# Patient Record
Sex: Male | Born: 1965 | Race: White | Hispanic: No | Marital: Married | Smoking: Current every day smoker
Health system: Southern US, Community
[De-identification: ages and names within clinical notes are randomized; demographics above are authoritative.]

## PROBLEM LIST (undated history)

## (undated) DIAGNOSIS — E119 Type 2 diabetes mellitus without complications: Secondary | ICD-10-CM

## (undated) DIAGNOSIS — I2510 Atherosclerotic heart disease of native coronary artery without angina pectoris: Secondary | ICD-10-CM

## (undated) DIAGNOSIS — I209 Angina pectoris, unspecified: Secondary | ICD-10-CM

## (undated) DIAGNOSIS — R339 Retention of urine, unspecified: Secondary | ICD-10-CM

## (undated) DIAGNOSIS — N35912 Unspecified bulbous urethral stricture, male: Secondary | ICD-10-CM

## (undated) DIAGNOSIS — N189 Chronic kidney disease, unspecified: Secondary | ICD-10-CM

## (undated) DIAGNOSIS — F1190 Opioid use, unspecified, uncomplicated: Secondary | ICD-10-CM

## (undated) DIAGNOSIS — I739 Peripheral vascular disease, unspecified: Secondary | ICD-10-CM

## (undated) DIAGNOSIS — K219 Gastro-esophageal reflux disease without esophagitis: Secondary | ICD-10-CM

## (undated) DIAGNOSIS — R0602 Shortness of breath: Secondary | ICD-10-CM

## (undated) DIAGNOSIS — I10 Essential (primary) hypertension: Secondary | ICD-10-CM

## (undated) DIAGNOSIS — G8929 Other chronic pain: Secondary | ICD-10-CM

## (undated) DIAGNOSIS — I25118 Atherosclerotic heart disease of native coronary artery with other forms of angina pectoris: Secondary | ICD-10-CM

## (undated) DIAGNOSIS — R609 Edema, unspecified: Secondary | ICD-10-CM

## (undated) DIAGNOSIS — J449 Chronic obstructive pulmonary disease, unspecified: Secondary | ICD-10-CM

## (undated) DIAGNOSIS — Z7902 Long term (current) use of antithrombotics/antiplatelets: Secondary | ICD-10-CM

## (undated) DIAGNOSIS — N529 Male erectile dysfunction, unspecified: Secondary | ICD-10-CM

## (undated) DIAGNOSIS — M1990 Unspecified osteoarthritis, unspecified site: Secondary | ICD-10-CM

## (undated) DIAGNOSIS — E785 Hyperlipidemia, unspecified: Secondary | ICD-10-CM

## (undated) DIAGNOSIS — E559 Vitamin D deficiency, unspecified: Secondary | ICD-10-CM

## (undated) DIAGNOSIS — R600 Localized edema: Secondary | ICD-10-CM

## (undated) HISTORY — PX: FINGER SURGERY: SHX640

## (undated) HISTORY — PX: SUBCLAVIAN ARTERY STENT: SHX2452

## (undated) HISTORY — PX: BACK SURGERY: SHX140

## (undated) HISTORY — PX: HERNIA REPAIR: SHX51

## (undated) HISTORY — PX: TONSILLECTOMY: SUR1361

---

## 2004-04-15 ENCOUNTER — Ambulatory Visit: Payer: Self-pay | Admitting: Physician Assistant

## 2004-06-30 ENCOUNTER — Inpatient Hospital Stay: Payer: Self-pay | Admitting: Unknown Physician Specialty

## 2005-11-29 ENCOUNTER — Emergency Department: Payer: Self-pay | Admitting: Emergency Medicine

## 2008-06-14 ENCOUNTER — Emergency Department: Payer: Self-pay | Admitting: Emergency Medicine

## 2010-01-11 HISTORY — PX: FETAL SURGERY FOR CONGENITAL HERNIA: SHX1618

## 2011-05-05 LAB — COMPREHENSIVE METABOLIC PANEL
Albumin: 4 g/dL (ref 3.4–5.0)
Alkaline Phosphatase: 136 U/L (ref 50–136)
Anion Gap: 11 (ref 7–16)
BUN: 4 mg/dL — ABNORMAL LOW (ref 7–18)
Bilirubin,Total: 0.4 mg/dL (ref 0.2–1.0)
Calcium, Total: 8.9 mg/dL (ref 8.5–10.1)
Chloride: 101 mmol/L (ref 98–107)
Creatinine: 1.12 mg/dL (ref 0.60–1.30)
EGFR (African American): >60
EGFR (Non-African Amer.): >60
Osmolality: 273 (ref 275–301)
Potassium: 3.8 mmol/L (ref 3.5–5.1)
Sodium: 136 mmol/L (ref 136–145)
Total Protein: 7.7 g/dL (ref 6.4–8.2)

## 2011-05-05 LAB — CBC
HCT: 48.1 % (ref 40.0–52.0)
HGB: 16.1 g/dL (ref 13.0–18.0)
MCH: 29.8 pg (ref 26.0–34.0)
MCHC: 33.5 g/dL (ref 32.0–36.0)
MCV: 89 fL (ref 80–100)
Platelet: 127 10*3/uL — ABNORMAL LOW (ref 150–440)

## 2011-05-05 LAB — TROPONIN I: Troponin-I: <0.02 ng/mL

## 2011-05-05 LAB — PROTIME-INR
INR: 0.9
Prothrombin Time: 12.4 secs (ref 11.5–14.7)

## 2011-05-05 LAB — APTT: Activated PTT: 38.4 secs — ABNORMAL HIGH (ref 23.6–35.9)

## 2011-05-06 ENCOUNTER — Observation Stay: Payer: Self-pay | Admitting: Internal Medicine

## 2011-08-12 ENCOUNTER — Ambulatory Visit: Payer: Self-pay | Admitting: Emergency Medicine

## 2012-05-22 ENCOUNTER — Ambulatory Visit: Payer: Self-pay | Admitting: Pain Medicine

## 2012-05-31 ENCOUNTER — Ambulatory Visit: Payer: Self-pay | Admitting: Pain Medicine

## 2012-06-20 ENCOUNTER — Ambulatory Visit: Payer: Self-pay | Admitting: Pain Medicine

## 2012-08-23 ENCOUNTER — Ambulatory Visit: Payer: Self-pay | Admitting: Pain Medicine

## 2012-09-26 ENCOUNTER — Ambulatory Visit: Payer: Self-pay | Admitting: Pain Medicine

## 2012-09-27 ENCOUNTER — Other Ambulatory Visit: Payer: Self-pay | Admitting: Neurological Surgery

## 2012-09-27 DIAGNOSIS — M542 Cervicalgia: Secondary | ICD-10-CM

## 2012-09-27 DIAGNOSIS — M549 Dorsalgia, unspecified: Secondary | ICD-10-CM

## 2012-10-11 MED ORDER — DIAZEPAM 5 MG PO TABS: 10.0000 mg | ORAL_TABLET | Freq: Once | ORAL | Status: DC

## 2012-10-13 ENCOUNTER — Other Ambulatory Visit: Payer: Self-pay

## 2012-10-13 ENCOUNTER — Inpatient Hospital Stay
Admission: RE | Admit: 2012-10-13 | Discharge: 2012-10-13 | Disposition: A | Discharge status: Home / Self Care | Payer: Self-pay | Source: Ambulatory Visit | Attending: Neurological Surgery | Admitting: Neurological Surgery

## 2012-10-13 DIAGNOSIS — M549 Dorsalgia, unspecified: Secondary | ICD-10-CM

## 2012-10-13 DIAGNOSIS — M542 Cervicalgia: Secondary | ICD-10-CM

## 2012-10-23 ENCOUNTER — Other Ambulatory Visit: Payer: Self-pay

## 2012-11-09 ENCOUNTER — Other Ambulatory Visit: Payer: Self-pay

## 2012-11-14 ENCOUNTER — Ambulatory Visit
Admission: RE | Admit: 2012-11-14 | Discharge: 2012-11-14 | Disposition: A | Discharge status: Home / Self Care | Payer: Medicare Other | Source: Ambulatory Visit | Attending: Neurological Surgery | Admitting: Neurological Surgery

## 2012-11-14 VITALS — BP 119/70 | HR 83

## 2012-11-14 DIAGNOSIS — M542 Cervicalgia: Secondary | ICD-10-CM

## 2012-11-14 DIAGNOSIS — M549 Dorsalgia, unspecified: Secondary | ICD-10-CM

## 2012-11-14 MED ORDER — ONDANSETRON HCL 4 MG/2ML IJ SOLN: 4.0000 mg | Freq: Four times a day (QID) | INTRAMUSCULAR | Status: DC | PRN

## 2012-11-14 MED ORDER — IOHEXOL 300 MG/ML  SOLN
10.0000 mL | Freq: Once | INTRAMUSCULAR | Status: AC | PRN
  Administered 2012-11-14: 10 mL via INTRATHECAL

## 2012-11-14 MED ORDER — DIAZEPAM 5 MG PO TABS
10.0000 mg | ORAL_TABLET | Freq: Once | ORAL | Status: AC
  Administered 2012-11-14: 10 mg via ORAL

## 2013-01-26 ENCOUNTER — Other Ambulatory Visit: Payer: Self-pay | Admitting: Neurological Surgery

## 2013-02-06 NOTE — Pre-Procedure Instructions (Signed)
Jose Carroll  02/06/2013   Your procedure is scheduled on:  02/07/13  Report to [place]  2 * 3 at 630 AM.  Call this number if you have problems the morning of surgery: [phone]   Remember:   Do not eat food or drink liquids after midnight.   Take these medicines the morning of surgery with A SIP OF WATER: all inhalers,flexeril,neurontin,dexilant   Do not wear jewelry, make-up or nail polish.  Do not wear lotions, powders, or perfumes. You may wear deodorant.  Do not shave 48 hours prior to surgery. Men may shave face and neck.  Do not bring valuables to the hospital.  [facility] is not responsible                  for any belongings or valuables.               Contacts, dentures or bridgework may not be worn into surgery.  Leave suitcase in the car. After surgery it may be brought to your room.  For patients admitted to the hospital, discharge time is determined by your                treatment team.               Patients discharged the day of surgery will not be allowed to drive  home.  Name and phone number of your driver:   Special Instructions: Shower using CHG 2 nights before surgery and the night before surgery.  If you shower the day of surgery use CHG.  Use special wash - you have one bottle of CHG for all showers.  You should use approximately 1/3 of the bottle for each shower.   Please read over the following fact sheets that you were given: Pain Booklet, Coughing and Deep Breathing, Blood Transfusion Information, MRSA Information and Surgical Site Infection Prevention

## 2013-02-07 ENCOUNTER — Encounter (HOSPITAL_COMMUNITY)
Admission: RE | Admit: 2013-02-07 | Discharge: 2013-02-07 | Disposition: A | Discharge status: Home / Self Care | Payer: Medicare Other | Source: Ambulatory Visit | Attending: Neurological Surgery | Admitting: Neurological Surgery

## 2013-02-07 ENCOUNTER — Encounter (HOSPITAL_COMMUNITY): Payer: Self-pay

## 2013-02-07 DIAGNOSIS — Z01810 Encounter for preprocedural cardiovascular examination: Secondary | ICD-10-CM | POA: Insufficient documentation

## 2013-02-07 DIAGNOSIS — Z01818 Encounter for other preprocedural examination: Secondary | ICD-10-CM | POA: Insufficient documentation

## 2013-02-07 DIAGNOSIS — Z01812 Encounter for preprocedural laboratory examination: Secondary | ICD-10-CM | POA: Insufficient documentation

## 2013-02-07 DIAGNOSIS — Z01811 Encounter for preprocedural respiratory examination: Secondary | ICD-10-CM | POA: Insufficient documentation

## 2013-02-07 HISTORY — DX: Shortness of breath: R06.02

## 2013-02-07 HISTORY — DX: Unspecified osteoarthritis, unspecified site: M19.90

## 2013-02-07 HISTORY — DX: Chronic obstructive pulmonary disease, unspecified: J44.9

## 2013-02-07 HISTORY — DX: Type 2 diabetes mellitus without complications: E11.9

## 2013-02-07 HISTORY — DX: Gastro-esophageal reflux disease without esophagitis: K21.9

## 2013-02-07 HISTORY — DX: Essential (primary) hypertension: I10

## 2013-02-07 LAB — BASIC METABOLIC PANEL
BUN: 16 mg/dL (ref 6–23)
CHLORIDE: 96 meq/L (ref 96–112)
CO2: 24 mEq/L (ref 19–32)
CREATININE: 1.45 mg/dL — AB (ref 0.50–1.35)
Calcium: 10.6 mg/dL — ABNORMAL HIGH (ref 8.4–10.5)
GFR, EST AFRICAN AMERICAN: 66 mL/min — AB (ref >90)
GFR, EST NON AFRICAN AMERICAN: 57 mL/min — AB (ref >90)
Glucose, Bld: 106 mg/dL — ABNORMAL HIGH (ref 70–99)
POTASSIUM: 4.6 meq/L (ref 3.7–5.3)
Sodium: 135 mEq/L — ABNORMAL LOW (ref 137–147)

## 2013-02-07 LAB — TYPE AND SCREEN
ABO/RH(D): A NEG
Antibody Screen: NEGATIVE

## 2013-02-07 LAB — SURGICAL PCR SCREEN
MRSA, PCR: NEGATIVE
STAPHYLOCOCCUS AUREUS: NEGATIVE

## 2013-02-07 LAB — CBC WITH DIFFERENTIAL/PLATELET
BASOS ABS: 0.1 10*3/uL (ref 0.0–0.1)
Basophils Relative: 1 % (ref 0–1)
EOS PCT: 7 % — AB (ref 0–5)
Eosinophils Absolute: 0.8 10*3/uL — ABNORMAL HIGH (ref 0.0–0.7)
HCT: 48.4 % (ref 39.0–52.0)
Hemoglobin: 17.1 g/dL — ABNORMAL HIGH (ref 13.0–17.0)
LYMPHS ABS: 3.1 10*3/uL (ref 0.7–4.0)
LYMPHS PCT: 28 % (ref 12–46)
MCH: 30.6 pg (ref 26.0–34.0)
MCHC: 35.3 g/dL (ref 30.0–36.0)
MCV: 86.7 fL (ref 78.0–100.0)
Monocytes Absolute: 0.7 10*3/uL (ref 0.1–1.0)
Monocytes Relative: 6 % (ref 3–12)
NEUTROS ABS: 6.7 10*3/uL (ref 1.7–7.7)
Neutrophils Relative %: 59 % (ref 43–77)
Platelets: 191 10*3/uL (ref 150–400)
RBC: 5.58 MIL/uL (ref 4.22–5.81)
RDW: 13.5 % (ref 11.5–15.5)
WBC: 11.3 10*3/uL — AB (ref 4.0–10.5)

## 2013-02-07 LAB — PROTIME-INR
INR: 0.89 (ref 0.00–1.49)
PROTHROMBIN TIME: 11.9 s (ref 11.6–15.2)

## 2013-02-07 LAB — ABO/RH: ABO/RH(D): A NEG

## 2013-02-07 NOTE — Progress Notes (Signed)
Dr Humphrey Rolls in [place] called for stress test,cath,ekg

## 2013-02-08 NOTE — Progress Notes (Addendum)
Anesthesia Chart Review:  Patient is a 48 year old male scheduled for L3-4 PLIF, removal of L4-S1 hardware on 02/14/13 by Dr. Ronnald Ramp.  History includes smoking, DM2, COPD, GERD, arthritis, HTN, polydactyly s/p sixth finger amputation, hernia repair, lumbar fusion '06. He receives primary care thru [facility] in [place].  His last office visit was with Dr. Elijio Miles.    EKG on 02/07/13 showed NSR. He reported a prior stress followed by a cath around 2012/2013.  [facility] did not have copies of a prior stress or cath.  Dr. Cecille Amsterdam note from 01/10/13 does mention that a CTA of the coronaries was done on 12/24/11 that showed "calcification of LCX and LAD without any significant disease, calcium score 67."    CXR on 02/07/13 showed no active cardiopulmonary disease.  Preoperative labs noted.  Cr 1.45, glucose 106, WBC 11.3, H/H 17.1/48.4. PT/INR WNL. T&S done.  I requested additional records from [facility] as available. These are pending, and I can review once received, but suspect that since his 2013 cardiac CTA showed no significant disease that he should be able to proceed as planned.  Jose Carroll [facility]/Anesthesiology Phone [phone] 02/08/2013 5:08 PM  Addendum 02/09/13 12:30 PM: Nuclear stress test from 05/06/11 received from [facility] that was normal study with normal LV systolic function, EF 67%. There was no cath and radiology reports faxed did not include a cardiac CTA.

## 2013-02-13 MED ORDER — DEXAMETHASONE SODIUM PHOSPHATE 10 MG/ML IJ SOLN
10.0000 mg | INTRAMUSCULAR | Status: AC
  Administered 2013-02-14: 10 mg via INTRAVENOUS
  Filled 2013-02-13: qty 1

## 2013-02-13 MED ORDER — VANCOMYCIN HCL IN DEXTROSE 1-5 GM/200ML-% IV SOLN
1000.0000 mg | INTRAVENOUS | Status: AC
  Administered 2013-02-14: 1000 mg via INTRAVENOUS
  Filled 2013-02-13: qty 200

## 2013-02-14 ENCOUNTER — Inpatient Hospital Stay (HOSPITAL_COMMUNITY): Payer: Medicare Other

## 2013-02-14 ENCOUNTER — Inpatient Hospital Stay (HOSPITAL_COMMUNITY)
Admission: RE | Admit: 2013-02-14 | Discharge: 2013-02-16 | DRG: 460 | Disposition: A | Discharge status: Home / Self Care | Payer: Medicare Other | Source: Ambulatory Visit | Attending: Neurological Surgery | Admitting: Neurological Surgery

## 2013-02-14 ENCOUNTER — Encounter (HOSPITAL_COMMUNITY): Payer: Self-pay | Admitting: Pharmacy Technician

## 2013-02-14 ENCOUNTER — Inpatient Hospital Stay (HOSPITAL_COMMUNITY): Payer: Medicare Other | Admitting: Critical Care Medicine

## 2013-02-14 ENCOUNTER — Encounter (HOSPITAL_COMMUNITY): Payer: Self-pay | Admitting: Critical Care Medicine

## 2013-02-14 ENCOUNTER — Encounter (HOSPITAL_COMMUNITY): Payer: Medicare Other | Admitting: Vascular Surgery

## 2013-02-14 ENCOUNTER — Encounter (HOSPITAL_COMMUNITY)
Admission: RE | Disposition: A | Discharge status: Home / Self Care | Payer: Medicare Other | Source: Ambulatory Visit | Attending: Neurological Surgery

## 2013-02-14 DIAGNOSIS — F17200 Nicotine dependence, unspecified, uncomplicated: Secondary | ICD-10-CM | POA: Diagnosis present

## 2013-02-14 DIAGNOSIS — J4489 Other specified chronic obstructive pulmonary disease: Secondary | ICD-10-CM | POA: Diagnosis present

## 2013-02-14 DIAGNOSIS — Z880 Allergy status to penicillin: Secondary | ICD-10-CM

## 2013-02-14 DIAGNOSIS — Z79899 Other long term (current) drug therapy: Secondary | ICD-10-CM

## 2013-02-14 DIAGNOSIS — E119 Type 2 diabetes mellitus without complications: Secondary | ICD-10-CM | POA: Diagnosis present

## 2013-02-14 DIAGNOSIS — I10 Essential (primary) hypertension: Secondary | ICD-10-CM | POA: Diagnosis present

## 2013-02-14 DIAGNOSIS — K219 Gastro-esophageal reflux disease without esophagitis: Secondary | ICD-10-CM | POA: Diagnosis present

## 2013-02-14 DIAGNOSIS — Z472 Encounter for removal of internal fixation device: Secondary | ICD-10-CM

## 2013-02-14 DIAGNOSIS — Z981 Arthrodesis status: Secondary | ICD-10-CM

## 2013-02-14 DIAGNOSIS — J449 Chronic obstructive pulmonary disease, unspecified: Secondary | ICD-10-CM | POA: Diagnosis present

## 2013-02-14 DIAGNOSIS — M48061 Spinal stenosis, lumbar region without neurogenic claudication: Principal | ICD-10-CM | POA: Diagnosis present

## 2013-02-14 LAB — GLUCOSE, CAPILLARY
GLUCOSE-CAPILLARY: 112 mg/dL — AB (ref 70–99)
Glucose-Capillary: 194 mg/dL — ABNORMAL HIGH (ref 70–99)
Glucose-Capillary: 184 mg/dL — ABNORMAL HIGH (ref 70–99)

## 2013-02-14 SURGERY — POSTERIOR LUMBAR FUSION 1 WITH HARDWARE REMOVAL
Anesthesia: General | Site: Back

## 2013-02-14 MED ORDER — GLYCOPYRROLATE 0.2 MG/ML IJ SOLN
INTRAMUSCULAR | Status: DC | PRN
  Administered 2013-02-14: 0.6 mg via INTRAVENOUS

## 2013-02-14 MED ORDER — AMLODIPINE BESYLATE 10 MG PO TABS
10.0000 mg | ORAL_TABLET | Freq: Every day | ORAL | Status: DC
  Administered 2013-02-15: 10 mg via ORAL
  Filled 2013-02-14 (×3): qty 1

## 2013-02-14 MED ORDER — EPHEDRINE SULFATE 50 MG/ML IJ SOLN
INTRAMUSCULAR | Status: AC
  Filled 2013-02-14: qty 1

## 2013-02-14 MED ORDER — PROPOFOL 10 MG/ML IV BOLUS
INTRAVENOUS | Status: DC | PRN
  Administered 2013-02-14: 200 mg via INTRAVENOUS

## 2013-02-14 MED ORDER — THROMBIN 5000 UNITS EX SOLR
OROMUCOSAL | Status: DC | PRN
  Administered 2013-02-14: 13:00:00 via TOPICAL

## 2013-02-14 MED ORDER — POTASSIUM CHLORIDE IN NACL 20-0.9 MEQ/L-% IV SOLN
INTRAVENOUS | Status: DC
  Filled 2013-02-14 (×5): qty 1000

## 2013-02-14 MED ORDER — BENAZEPRIL HCL 40 MG PO TABS
40.0000 mg | ORAL_TABLET | Freq: Every day | ORAL | Status: DC
  Administered 2013-02-14 – 2013-02-15 (×2): 40 mg via ORAL
  Filled 2013-02-14 (×3): qty 1

## 2013-02-14 MED ORDER — LINAGLIPTIN 5 MG PO TABS
5.0000 mg | ORAL_TABLET | Freq: Every day | ORAL | Status: DC
  Administered 2013-02-15 – 2013-02-16 (×2): 5 mg via ORAL
  Filled 2013-02-14 (×3): qty 1

## 2013-02-14 MED ORDER — HYDROMORPHONE HCL PF 1 MG/ML IJ SOLN
0.2500 mg | INTRAMUSCULAR | Status: DC | PRN
  Administered 2013-02-14 (×2): 0.5 mg via INTRAVENOUS

## 2013-02-14 MED ORDER — MENTHOL 3 MG MT LOZG: 1.0000 | LOZENGE | OROMUCOSAL | Status: DC | PRN

## 2013-02-14 MED ORDER — LACTATED RINGERS IV SOLN
INTRAVENOUS | Status: DC | PRN
  Administered 2013-02-14 (×2): via INTRAVENOUS

## 2013-02-14 MED ORDER — SODIUM CHLORIDE 0.9 % IJ SOLN
3.0000 mL | Freq: Two times a day (BID) | INTRAMUSCULAR | Status: DC
Start: 2013-02-14 — End: 2013-02-16
  Administered 2013-02-14 – 2013-02-15 (×3): 3 mL via INTRAVENOUS

## 2013-02-14 MED ORDER — ONDANSETRON HCL 4 MG/2ML IJ SOLN
4.0000 mg | INTRAMUSCULAR | Status: DC | PRN
  Administered 2013-02-14: 4 mg via INTRAVENOUS
  Filled 2013-02-14: qty 2

## 2013-02-14 MED ORDER — GABAPENTIN 600 MG PO TABS
1200.0000 mg | ORAL_TABLET | Freq: Three times a day (TID) | ORAL | Status: DC
  Administered 2013-02-14 – 2013-02-15 (×4): 1200 mg via ORAL
  Filled 2013-02-14 (×8): qty 2

## 2013-02-14 MED ORDER — ONDANSETRON HCL 4 MG/2ML IJ SOLN: 4.0000 mg | Freq: Once | INTRAMUSCULAR | Status: DC | PRN

## 2013-02-14 MED ORDER — NEOSTIGMINE METHYLSULFATE 1 MG/ML IJ SOLN
INTRAMUSCULAR | Status: AC
  Filled 2013-02-14: qty 10

## 2013-02-14 MED ORDER — MIDAZOLAM HCL 5 MG/5ML IJ SOLN
INTRAMUSCULAR | Status: DC | PRN
  Administered 2013-02-14: 1 mg via INTRAVENOUS

## 2013-02-14 MED ORDER — MORPHINE SULFATE 2 MG/ML IJ SOLN
1.0000 mg | INTRAMUSCULAR | Status: DC | PRN
  Administered 2013-02-14: 4 mg via INTRAVENOUS
  Filled 2013-02-14: qty 2

## 2013-02-14 MED ORDER — AMLODIPINE BESY-BENAZEPRIL HCL 10-40 MG PO CAPS: 1.0000 | ORAL_CAPSULE | Freq: Every day | ORAL | Status: DC

## 2013-02-14 MED ORDER — SUCCINYLCHOLINE CHLORIDE 20 MG/ML IJ SOLN
INTRAMUSCULAR | Status: AC
  Filled 2013-02-14: qty 1

## 2013-02-14 MED ORDER — CYCLOBENZAPRINE HCL 10 MG PO TABS
10.0000 mg | ORAL_TABLET | Freq: Three times a day (TID) | ORAL | Status: DC | PRN
Start: 2013-02-14 — End: 2013-02-16
  Administered 2013-02-14 – 2013-02-16 (×5): 10 mg via ORAL
  Filled 2013-02-14 (×5): qty 1

## 2013-02-14 MED ORDER — FENTANYL CITRATE 0.05 MG/ML IJ SOLN
INTRAMUSCULAR | Status: AC
  Filled 2013-02-14: qty 5

## 2013-02-14 MED ORDER — PANTOPRAZOLE SODIUM 40 MG PO TBEC
40.0000 mg | DELAYED_RELEASE_TABLET | Freq: Every day | ORAL | Status: DC
  Administered 2013-02-15 – 2013-02-16 (×2): 40 mg via ORAL
  Filled 2013-02-14 (×2): qty 1

## 2013-02-14 MED ORDER — 0.9 % SODIUM CHLORIDE (POUR BTL) OPTIME
TOPICAL | Status: DC | PRN
  Administered 2013-02-14: 1000 mL

## 2013-02-14 MED ORDER — LIDOCAINE HCL (CARDIAC) 20 MG/ML IV SOLN
INTRAVENOUS | Status: DC | PRN
  Administered 2013-02-14: 70 mg via INTRAVENOUS

## 2013-02-14 MED ORDER — ROCURONIUM BROMIDE 100 MG/10ML IV SOLN
INTRAVENOUS | Status: DC | PRN
  Administered 2013-02-14 (×2): 5 mg via INTRAVENOUS
  Administered 2013-02-14: 50 mg via INTRAVENOUS

## 2013-02-14 MED ORDER — ACETAMINOPHEN 650 MG RE SUPP: 650.0000 mg | RECTAL | Status: DC | PRN

## 2013-02-14 MED ORDER — ROCURONIUM BROMIDE 50 MG/5ML IV SOLN
INTRAVENOUS | Status: AC
  Filled 2013-02-14: qty 1

## 2013-02-14 MED ORDER — ALBUTEROL SULFATE HFA 108 (90 BASE) MCG/ACT IN AERS
INHALATION_SPRAY | RESPIRATORY_TRACT | Status: DC | PRN
  Administered 2013-02-14: 5 via RESPIRATORY_TRACT
  Administered 2013-02-14: 2 via RESPIRATORY_TRACT

## 2013-02-14 MED ORDER — ARTIFICIAL TEARS OP OINT
TOPICAL_OINTMENT | OPHTHALMIC | Status: DC | PRN
  Administered 2013-02-14: 1 via OPHTHALMIC

## 2013-02-14 MED ORDER — CYCLOBENZAPRINE HCL 10 MG PO TABS
ORAL_TABLET | ORAL | Status: AC
  Filled 2013-02-14: qty 1

## 2013-02-14 MED ORDER — ONDANSETRON HCL 4 MG/2ML IJ SOLN
INTRAMUSCULAR | Status: AC
  Filled 2013-02-14: qty 2

## 2013-02-14 MED ORDER — CEFAZOLIN SODIUM 1-5 GM-% IV SOLN: 1.0000 g | Freq: Three times a day (TID) | INTRAVENOUS | Status: DC

## 2013-02-14 MED ORDER — ALBUTEROL SULFATE (2.5 MG/3ML) 0.083% IN NEBU
2.5000 mg | INHALATION_SOLUTION | Freq: Four times a day (QID) | RESPIRATORY_TRACT | Status: DC
  Administered 2013-02-14 – 2013-02-15 (×3): 2.5 mg via RESPIRATORY_TRACT
  Filled 2013-02-14 (×3): qty 3

## 2013-02-14 MED ORDER — NEOSTIGMINE METHYLSULFATE 1 MG/ML IJ SOLN
INTRAMUSCULAR | Status: DC | PRN
  Administered 2013-02-14: 5 mg via INTRAVENOUS

## 2013-02-14 MED ORDER — FENTANYL CITRATE 0.05 MG/ML IJ SOLN
INTRAMUSCULAR | Status: DC | PRN
  Administered 2013-02-14 (×2): 50 ug via INTRAVENOUS
  Administered 2013-02-14: 100 ug via INTRAVENOUS
  Administered 2013-02-14: 50 ug via INTRAVENOUS
  Administered 2013-02-14: 150 ug via INTRAVENOUS
  Administered 2013-02-14: 50 ug via INTRAVENOUS

## 2013-02-14 MED ORDER — BUPIVACAINE HCL (PF) 0.25 % IJ SOLN
INTRAMUSCULAR | Status: DC | PRN
  Administered 2013-02-14: 10 mL

## 2013-02-14 MED ORDER — ONDANSETRON HCL 4 MG/2ML IJ SOLN
INTRAMUSCULAR | Status: DC | PRN
  Administered 2013-02-14: 4 mg via INTRAVENOUS

## 2013-02-14 MED ORDER — LIDOCAINE HCL (CARDIAC) 20 MG/ML IV SOLN
INTRAVENOUS | Status: AC
  Filled 2013-02-14: qty 5

## 2013-02-14 MED ORDER — SODIUM CHLORIDE 0.9 % IR SOLN
Status: DC | PRN
  Administered 2013-02-14: 13:00:00

## 2013-02-14 MED ORDER — THROMBIN 20000 UNITS EX SOLR
CUTANEOUS | Status: DC | PRN
  Administered 2013-02-14: 13:00:00 via TOPICAL

## 2013-02-14 MED ORDER — ACETAMINOPHEN 325 MG PO TABS
650.0000 mg | ORAL_TABLET | ORAL | Status: DC | PRN
Start: 2013-02-14 — End: 2013-02-16

## 2013-02-14 MED ORDER — CHLORTHALIDONE 25 MG PO TABS
12.5000 mg | ORAL_TABLET | Freq: Every day | ORAL | Status: DC
  Administered 2013-02-14: 12.5 mg via ORAL
  Administered 2013-02-15: 10:00:00 via ORAL
  Filled 2013-02-14 (×3): qty 0.5

## 2013-02-14 MED ORDER — ARTIFICIAL TEARS OP OINT
TOPICAL_OINTMENT | OPHTHALMIC | Status: AC
  Filled 2013-02-14: qty 3.5

## 2013-02-14 MED ORDER — ALBUTEROL SULFATE HFA 108 (90 BASE) MCG/ACT IN AERS: 1.0000 | INHALATION_SPRAY | Freq: Four times a day (QID) | RESPIRATORY_TRACT | Status: DC

## 2013-02-14 MED ORDER — PROPOFOL 10 MG/ML IV BOLUS
INTRAVENOUS | Status: AC
  Filled 2013-02-14: qty 20

## 2013-02-14 MED ORDER — VANCOMYCIN HCL IN DEXTROSE 1-5 GM/200ML-% IV SOLN
1000.0000 mg | Freq: Two times a day (BID) | INTRAVENOUS | Status: AC
  Administered 2013-02-14 – 2013-02-15 (×2): 1000 mg via INTRAVENOUS
  Filled 2013-02-14 (×2): qty 200

## 2013-02-14 MED ORDER — SODIUM CHLORIDE 0.9 % IJ SOLN
INTRAMUSCULAR | Status: AC
  Filled 2013-02-14: qty 10

## 2013-02-14 MED ORDER — SODIUM CHLORIDE 0.9 % IJ SOLN: 3.0000 mL | INTRAMUSCULAR | Status: DC | PRN

## 2013-02-14 MED ORDER — METFORMIN HCL 500 MG PO TABS
1000.0000 mg | ORAL_TABLET | Freq: Two times a day (BID) | ORAL | Status: DC
  Administered 2013-02-14 – 2013-02-16 (×4): 1000 mg via ORAL
  Filled 2013-02-14 (×6): qty 2

## 2013-02-14 MED ORDER — OXYCODONE-ACETAMINOPHEN 5-325 MG PO TABS
1.0000 | ORAL_TABLET | ORAL | Status: DC | PRN
  Administered 2013-02-14 – 2013-02-16 (×9): 2 via ORAL
  Filled 2013-02-14 (×9): qty 2

## 2013-02-14 MED ORDER — HYDROMORPHONE HCL PF 1 MG/ML IJ SOLN
INTRAMUSCULAR | Status: AC
Start: 2013-02-14 — End: 2013-02-15
  Filled 2013-02-14: qty 1

## 2013-02-14 MED ORDER — MIDAZOLAM HCL 2 MG/2ML IJ SOLN
INTRAMUSCULAR | Status: AC
  Filled 2013-02-14: qty 2

## 2013-02-14 MED ORDER — LIDOCAINE HCL 4 % MT SOLN
OROMUCOSAL | Status: DC | PRN
  Administered 2013-02-14: 4 mL via TOPICAL

## 2013-02-14 MED ORDER — PHENOL 1.4 % MT LIQD: 1.0000 | OROMUCOSAL | Status: DC | PRN

## 2013-02-14 SURGICAL SUPPLY — 59 items
BAG DECANTER FOR FLEXI CONT (MISCELLANEOUS) ×2 IMPLANT
BENZOIN TINCTURE PRP APPL 2/3 (GAUZE/BANDAGES/DRESSINGS) ×2 IMPLANT
BLADE SURG ROTATE 9660 (MISCELLANEOUS) ×2 IMPLANT
BONE MATRIX OSTEOCEL PRO MED (Bone Implant) ×2 IMPLANT
BUR MATCHSTICK NEURO 3.0 LAGG (BURR) ×2 IMPLANT
CAGE COROENT 10X9X28-8 LUMBAR (Cage) ×4 IMPLANT
CAGE COROENT PLIF 10X28-8 LUMB (Cage) ×4 IMPLANT
CANISTER SUCT 3000ML (MISCELLANEOUS) ×2 IMPLANT
CONT SPEC 4OZ CLIKSEAL STRL BL (MISCELLANEOUS) ×4 IMPLANT
CONT SPEC STER OR (MISCELLANEOUS) ×2 IMPLANT
COVER BACK TABLE 24X17X13 BIG (DRAPES) IMPLANT
COVER TABLE BACK 60X90 (DRAPES) ×2 IMPLANT
DRAPE C-ARM 42X72 X-RAY (DRAPES) ×4 IMPLANT
DRAPE LAPAROTOMY 100X72X124 (DRAPES) ×2 IMPLANT
DRAPE POUCH INSTRU U-SHP 10X18 (DRAPES) ×2 IMPLANT
DRAPE SURG 17X23 STRL (DRAPES) ×2 IMPLANT
DRESSING TELFA 8X3 (GAUZE/BANDAGES/DRESSINGS) ×2 IMPLANT
DRSG OPSITE 4X5.5 SM (GAUZE/BANDAGES/DRESSINGS) ×2 IMPLANT
DRSG OPSITE POSTOP 4X10 (GAUZE/BANDAGES/DRESSINGS) ×2 IMPLANT
DURAPREP 26ML APPLICATOR (WOUND CARE) ×2 IMPLANT
ELECT REM PT RETURN 9FT ADLT (ELECTROSURGICAL) ×2
ELECTRODE REM PT RTRN 9FT ADLT (ELECTROSURGICAL) ×1 IMPLANT
EVACUATOR 1/8 PVC DRAIN (DRAIN) ×2 IMPLANT
GAUZE SPONGE 4X4 16PLY XRAY LF (GAUZE/BANDAGES/DRESSINGS) IMPLANT
GLOVE BIO SURGEON STRL SZ8 (GLOVE) ×8 IMPLANT
GLOVE BIOGEL PI IND STRL 7.0 (GLOVE) ×2 IMPLANT
GLOVE BIOGEL PI INDICATOR 7.0 (GLOVE) ×2
GLOVE INDICATOR 8.5 STRL (GLOVE) ×2 IMPLANT
GLOVE OPTIFIT SS 6.5 STRL BRWN (GLOVE) ×6 IMPLANT
GOWN BRE IMP SLV AUR LG STRL (GOWN DISPOSABLE) IMPLANT
GOWN BRE IMP SLV AUR XL STRL (GOWN DISPOSABLE) ×4 IMPLANT
GOWN STRL REIN 2XL LVL4 (GOWN DISPOSABLE) IMPLANT
HEMOSTAT POWDER KIT SURGIFOAM (HEMOSTASIS) IMPLANT
KIT BASIN OR (CUSTOM PROCEDURE TRAY) ×2 IMPLANT
KIT ROOM TURNOVER OR (KITS) ×2 IMPLANT
NEEDLE HYPO 25X1 1.5 SAFETY (NEEDLE) ×2 IMPLANT
NS IRRIG 1000ML POUR BTL (IV SOLUTION) ×2 IMPLANT
PACK LAMINECTOMY NEURO (CUSTOM PROCEDURE TRAY) ×2 IMPLANT
PAD ARMBOARD 7.5X6 YLW CONV (MISCELLANEOUS) ×6 IMPLANT
ROD PREBENT 60MM (Rod) ×4 IMPLANT
SCREW LOCK (Screw) ×4 IMPLANT
SCREW LOCK 100X5.5X OPN (Screw) ×4 IMPLANT
SCREW POLY 45X6.5 (Screw) ×4 IMPLANT
SCREW POLY 6.5X45MM (Screw) ×4 IMPLANT
SPONGE GAUZE 4X4 12PLY (GAUZE/BANDAGES/DRESSINGS) ×2 IMPLANT
SPONGE LAP 4X18 X RAY DECT (DISPOSABLE) IMPLANT
SPONGE SURGIFOAM ABS GEL 100 (HEMOSTASIS) ×2 IMPLANT
STRIP CLOSURE SKIN 1/2X4 (GAUZE/BANDAGES/DRESSINGS) ×4 IMPLANT
SUT VIC AB 0 CT1 18XCR BRD8 (SUTURE) ×2 IMPLANT
SUT VIC AB 0 CT1 8-18 (SUTURE) ×2
SUT VIC AB 2-0 CP2 18 (SUTURE) ×4 IMPLANT
SUT VIC AB 3-0 SH 8-18 (SUTURE) ×4 IMPLANT
SYR 20ML ECCENTRIC (SYRINGE) ×2 IMPLANT
TAPE STRIPS DRAPE STRL (GAUZE/BANDAGES/DRESSINGS) ×2 IMPLANT
TOWEL OR 17X24 6PK STRL BLUE (TOWEL DISPOSABLE) ×2 IMPLANT
TOWEL OR 17X26 10 PK STRL BLUE (TOWEL DISPOSABLE) ×2 IMPLANT
TRAP SPECIMEN MUCOUS 40CC (MISCELLANEOUS) IMPLANT
TRAY FOLEY CATH 14FRSI W/METER (CATHETERS) ×2 IMPLANT
WATER STERILE IRR 1000ML POUR (IV SOLUTION) ×2 IMPLANT

## 2013-02-14 NOTE — Anesthesia Postprocedure Evaluation (Signed)
  Anesthesia Post-op Note  Patient: Jose Carroll  Procedure(s) Performed: Procedure(s) with comments: POSTERIOR LUMBAR FUSION LUMBAR THREE-FOUR WITH HARDWARE REMOVAL LUMBAR FOUR TO SACRAL ONE (N/A) - POSTERIOR LUMBAR FUSION LUMBAR THREE-FOUR WITH HARDWARE REMOVAL LUMBAR FOUR TO SACRAL ONE  Patient Location: PACU  Anesthesia Type:General  Level of Consciousness: awake, alert , oriented and patient cooperative  Airway and Oxygen Therapy: Patient Spontanous Breathing  Post-op Pain: mild  Post-op Assessment: Post-op Vital signs reviewed  Post-op Vital Signs: stable  Complications: No apparent anesthesia complications

## 2013-02-14 NOTE — Anesthesia Preprocedure Evaluation (Addendum)
Anesthesia Evaluation  Patient identified by MRN, date of birth, ID band Patient awake    Reviewed: Allergy & Precautions, H&P , NPO status , Patient's Chart, lab work & pertinent test results  Airway Mallampati: I TM Distance: >3 FB Neck ROM: Full    Dental  (+) Lower Dentures and Upper Dentures   Pulmonary shortness of breath, COPD COPD inhaler, Current Smoker,          Cardiovascular hypertension, Pt. on medications     Neuro/Psych    GI/Hepatic GERD-  ,  Endo/Other  diabetes, Type 2, Oral Hypoglycemic Agents  Renal/GU      Musculoskeletal  (+) Arthritis -,   Abdominal   Peds  Hematology   Anesthesia Other Findings   Reproductive/Obstetrics                         Anesthesia Physical Anesthesia Plan  ASA: III  Anesthesia Plan: General   Post-op Pain Management:    Induction: Intravenous  Airway Management Planned: Oral ETT  Additional Equipment:   Intra-op Plan:   Post-operative Plan: Extubation in OR  Informed Consent: I have reviewed the patients History and Physical, chart, labs and discussed the procedure including the risks, benefits and alternatives for the proposed anesthesia with the patient or authorized representative who has indicated his/her understanding and acceptance.     Plan Discussed with:   Anesthesia Plan Comments:         Anesthesia Quick Evaluation

## 2013-02-14 NOTE — Progress Notes (Signed)
ANTIBIOTIC CONSULT NOTE - INITIAL  Pharmacy Consult for Vancomycin Indication: Post-op surgical prophylaxis  Allergies  Allergen Reactions  . Naproxen Other (See Comments)    Pt becomes ill tempered  . Penicillins Hives    Patient Measurements:  Weight: 94kg  Vital Signs: Temp: 97.7 F (36.5 C) (02/04 1611) Temp src: Oral (02/04 0713) BP: 121/74 mmHg (02/04 1611) Pulse Rate: 89 (02/04 1611) Intake/Output from previous day:   Intake/Output from this shift: Total I/O In: 1600 [I.V.:1600] Out: 400 [Urine:150; Drains:50; Blood:200]  Labs: No results found for this basename: WBC, HGB, PLT, LABCREA, CREATININE,  in the last 72 hours CrCl is unknown because there is no height on file for the current visit. No results found for this basename: VANCOTROUGH, Corlis Leak, VANCORANDOM, GENTTROUGH, GENTPEAK, GENTRANDOM, TOBRATROUGH, TOBRAPEAK, TOBRARND, AMIKACINPEAK, AMIKACINTROU, AMIKACIN,  in the last 72 hours   Microbiology: Recent Results (from the past 720 hour(s))  SURGICAL PCR SCREEN     Status: None   Collection Time    02/07/13 12:19 PM      Result Value Range Status   MRSA, PCR NEGATIVE  NEGATIVE Final   Staphylococcus aureus NEGATIVE  NEGATIVE Final   Comment:            The Xpert SA Assay (FDA     approved for NASAL specimens     in patients over 11 years of age),     is one component of     a comprehensive surveillance     program.  Test performance has     been validated by [employer] for patients greater     than or equal to 63 year old.     It is not intended     to diagnose infection nor to     guide or monitor treatment.    Medical History: Past Medical History  Diagnosis Date  . Diabetes mellitus without complication   . COPD (chronic obstructive pulmonary disease)   . Shortness of breath   . Arthritis   . Hypertension     dr Humphrey Rolls   [place]  . GERD (gastroesophageal reflux disease)     Medications:  Prescriptions prior to admission   Medication Sig Dispense Refill  . albuterol (PROVENTIL HFA;VENTOLIN HFA) 108 (90 BASE) MCG/ACT inhaler Inhale 1 puff into the lungs every 6 (six) hours.       Marland Kitchen amLODipine-benazepril (LOTREL) 10-40 MG per capsule Take 1 capsule by mouth daily.      . chlorthalidone (HYGROTON) 25 MG tablet Take 12.5 mg by mouth daily.      . cyclobenzaprine (FLEXERIL) 5 MG tablet Take 5 mg by mouth 3 (three) times daily as needed for muscle spasms.      Marland Kitchen dexlansoprazole (DEXILANT) 60 MG capsule Take 60 mg by mouth every other day.       . fenofibrate (TRICOR) 145 MG tablet Take 145 mg by mouth daily.      Marland Kitchen gabapentin (NEURONTIN) 600 MG tablet Take 1,200 mg by mouth 3 (three) times daily.      Marland Kitchen HYDROcodone-acetaminophen (NORCO/VICODIN) 5-325 MG per tablet Take 2 tablets by mouth every 6 (six) hours as needed for moderate pain.      . metFORMIN (GLUCOPHAGE) 1000 MG tablet Take 1,000 mg by mouth 2 (two) times daily with a meal.      . sitaGLIPtin (JANUVIA) 100 MG tablet Take 100 mg by mouth every morning.       Assessment: 47yom  s/p spinal procedure to receive Vancomycin for post-op surgical prophylaxis (x2 doses per MD order). Patient has PCN allergy and received Vancomycin 1g pre-op (~1000).  - Weight: 94kg - CrCl: ~74 ml/min  Goal of Therapy:  Vancomycin trough level 10-15 mcg/ml  Plan:  1. Vancomycin 1g IV q12h x 2 doses 2. Pharmacy to sign off. Please reconsult if additional assistance is needed or additional doses are warranted.   Earleen Newport [phone] 02/14/2013,4:39 PM

## 2013-02-14 NOTE — OR Nursing (Signed)
Prior to turning patient for positioning, three foley catheter placements were attempted. Latricia Heft RN attempted to place a 16 french foley catheter, but the tip was not able to pass beyond the urinary meatus. A 14 french foley catheter was successfully advanced through the urethra, but was unable to pass beyond half of the catheter tube's length. No urine was returned. A coude catheter was also attempted by Latricia Heft RN, but was also not able to pass beyond the urinary meatus. Dr. Ronnald Ramp was informed of the inability to place an indwelling catheter and said a condom catheter would suffice for the surgery. A medium-tipped condom catheter tip with a collection bag was placed. No bleeding was observed from the urinary meatus following the catheterization attempts.

## 2013-02-14 NOTE — Anesthesia Procedure Notes (Addendum)
Procedure Name: Intubation Date/Time: 02/14/2013 9:42 AM Performed by: LEE, HEATHER BROWN Pre-anesthesia Checklist: Patient identified, Timeout performed, Emergency Drugs available, Suction available and Patient being monitored Patient Re-evaluated:Patient Re-evaluated prior to inductionOxygen Delivery Method: Circle system utilized Preoxygenation: Pre-oxygenation with 100% oxygen Intubation Type: IV induction Ventilation: Mask ventilation without difficulty Laryngoscope Size: Mac and 4 Grade View: Grade I Tube type: Oral Tube size: 7.5 mm Number of attempts: 1 Airway Equipment and Method: Stylet and LTA kit utilized Placement Confirmation: ETT inserted through vocal cords under direct vision,  positive ETCO2 and breath sounds checked- equal and bilateral Secured at: 24 cm Tube secured with: Tape Dental Injury: Teeth and Oropharynx as per pre-operative assessment      

## 2013-02-14 NOTE — Preoperative (Signed)
Beta Blockers   Reason not to administer Beta Blockers:Not Applicable 

## 2013-02-14 NOTE — H&P (Signed)
Subjective: Patient is a 48 y.o. male admitted for PLIF L3-4. He had a previous lumbar fusion L4-S1 by another Psychologist, sport and exercise. Onset of symptoms was several months ago, gradually worsening since that time.  The pain is rated severe, and is located at the across the lower back and radiates to right more than left leg. The pain is described as aching and occurs all day. The symptoms have been progressive. Symptoms are exacerbated by exercise. MRI or CT showed adjacent level stenosis L3-4 with some moderate stenosis L2-3. I believe he has solid fusions at L4-5 and L5-S1 as best we can tell by CT imaging.   Past Medical History  Diagnosis Date  . Diabetes mellitus without complication   . COPD (chronic obstructive pulmonary disease)   . Shortness of breath   . Arthritis   . Hypertension     dr Humphrey Rolls     . GERD (gastroesophageal reflux disease)     Past Surgical History  Procedure Laterality Date  . Back surgery      x3  . Finger surgery      sixth finger removed  . Hernia repair      Prior to Admission medications   Medication Sig Start Date End Date Taking? Authorizing Provider  albuterol (PROVENTIL HFA;VENTOLIN HFA) 108 (90 BASE) MCG/ACT inhaler Inhale 1 puff into the lungs every 6 (six) hours.    Yes Historical Provider, MD  amLODipine-benazepril (LOTREL) 10-40 MG per capsule Take 1 capsule by mouth daily.   Yes Historical Provider, MD  chlorthalidone (HYGROTON) 25 MG tablet Take 12.5 mg by mouth daily.   Yes Historical Provider, MD  cyclobenzaprine (FLEXERIL) 5 MG tablet Take 5 mg by mouth 3 (three) times daily as needed for muscle spasms.   Yes Historical Provider, MD  dexlansoprazole (DEXILANT) 60 MG capsule Take 60 mg by mouth every other day.    Yes Historical Provider, MD  fenofibrate (TRICOR) 145 MG tablet Take 145 mg by mouth daily.   Yes Historical Provider, MD  gabapentin (NEURONTIN) 600 MG tablet Take 1,200 mg by mouth 3 (three) times daily.   Yes Historical Provider, MD   HYDROcodone-acetaminophen (NORCO/VICODIN) 5-325 MG per tablet Take 2 tablets by mouth every 6 (six) hours as needed for moderate pain.   Yes Historical Provider, MD  metFORMIN (GLUCOPHAGE) 1000 MG tablet Take 1,000 mg by mouth 2 (two) times daily with a meal.   Yes Historical Provider, MD  sitaGLIPtin (JANUVIA) 100 MG tablet Take 100 mg by mouth every morning.   Yes Historical Provider, MD   Allergies  Allergen Reactions  . Naproxen Other (See Comments)    Pt becomes ill tempered  . Penicillins Hives    History  Substance Use Topics  . Smoking status: Current Every Day Smoker -- 1.50 packs/day for 25 years  . Smokeless tobacco: Not on file  . Alcohol Use: No    History reviewed. No pertinent family history.   Review of Systems  Positive ROS: neg  All other systems have been reviewed and were otherwise negative with the exception of those mentioned in the HPI and as above.  Objective: Vital signs in last 24 hours: Temp:  [96.9 F (36.1 C)] 96.9 F (36.1 C) (02/04 0713) Pulse Rate:  [86] 86 (02/04 0713) Resp:  [18] 18 (02/04 0713) SpO2:  [98 %] 98 % (02/04 0713)  General Appearance: Alert, cooperative, no distress, appears stated age Head: Normocephalic, without obvious abnormality, atraumatic Eyes: PERRL, conjunctiva/corneas clear, EOM's intact  Neck: Supple, symmetrical, trachea midline Back: Symmetric, no curvature, ROM normal, no CVA tenderness Lungs:  respirations unlabored Heart: Regular rate and rhythm Abdomen: Soft, non-tender Extremities: Extremities normal, atraumatic, no cyanosis or edema Pulses: 2+ and symmetric all extremities Skin: Skin color, texture, turgor normal, no rashes or lesions  NEUROLOGIC:   Mental status: Alert and oriented x4,  no aphasia, good attention span, fund of knowledge, and memory Motor Exam - grossly normal Sensory Exam - grossly normal Reflexes: 1+ Coordination - grossly normal Gait - grossly normal Balance - grossly  normal Cranial Nerves: I: smell Not tested  II: visual acuity  OS: nl    OD: nl  II: visual fields Full to confrontation  II: pupils Equal, round, reactive to light  III,VII: ptosis None  III,IV,VI: extraocular muscles  Full ROM  V: mastication Normal  V: facial light touch sensation  Normal  V,VII: corneal reflex  Present  VII: facial muscle function - upper  Normal  VII: facial muscle function - lower Normal  VIII: hearing Not tested  IX: soft palate elevation  Normal  IX,X: gag reflex Present  XI: trapezius strength  5/5  XI: sternocleidomastoid strength 5/5  XI: neck flexion strength  5/5  XII: tongue strength  Normal    Data Review Lab Results  Component Value Date   WBC 11.3* 02/07/2013   HGB 17.1* 02/07/2013   HCT 48.4 02/07/2013   MCV 86.7 02/07/2013   PLT 191 02/07/2013   Lab Results  Component Value Date   NA 135* 02/07/2013   K 4.6 02/07/2013   CL 96 02/07/2013   CO2 24 02/07/2013   BUN 16 02/07/2013   CREATININE 1.45* 02/07/2013   GLUCOSE 106* 02/07/2013   Lab Results  Component Value Date   INR 0.89 02/07/2013    Assessment/Plan: Patient admitted for PLIF L3-4 with removal of hardware L4-5 and L5-S1. We'll also undercut L2-3.Marland Kitchen Patient has failed a reasonable attempt at conservative therapy.  I explained the condition and procedure to the patient and answered any questions.  Patient wishes to proceed with procedure as planned. Understands risks/ benefits and typical outcomes of procedure.   Jose Carroll S 02/14/2013 9:50 AM

## 2013-02-14 NOTE — Transfer of Care (Signed)
Immediate Anesthesia Transfer of Care Note  Patient: Jose Carroll  Procedure(s) Performed: Procedure(s) with comments: POSTERIOR LUMBAR FUSION LUMBAR THREE-FOUR WITH HARDWARE REMOVAL LUMBAR FOUR TO SACRAL ONE (N/A) - POSTERIOR LUMBAR FUSION LUMBAR THREE-FOUR WITH HARDWARE REMOVAL LUMBAR FOUR TO SACRAL ONE  Patient Location: PACU  Anesthesia Type:General  Level of Consciousness: patient cooperative and responds to stimulation  Airway & Oxygen Therapy: Patient Spontanous Breathing and Patient connected to nasal cannula oxygen  Post-op Assessment: Report given to PACU RN and Post -op Vital signs reviewed and stable  Post vital signs: Reviewed and stable  Complications: No apparent anesthesia complications

## 2013-02-14 NOTE — Op Note (Signed)
02/14/2013  2:04 PM  PATIENT:  Jose Carroll  48 y.o. 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 Decompressive lumbar laminectomy L2-3 and L3-4 requiring more work than would be required for a simple exposure of the disk for PLIF in order to adequately decompress the neural elements and address the spinal stenosis (note that the decompression at L2-3 is a level separate from the PLIF level) 2. Posterior lumbar interbody fusion L3-4 using PEEK interbody cages packed with morcellized allograft and autograft 3. Posterior fixation L3-4 using Nuvasive pedicle screws.  4. Intertransverse arthrodesis L3-4 using morcellized autograft and allograft. 5. expiration of fusion L4-S1 and removal of segmental fixation L4-S1  SURGEON:  Sherley Bounds, MD  ASSISTANTS: Dr Saintclair Halsted  ANESTHESIA:  General  EBL: 300 ml  Total I/O In: 1000 [I.V.:1000] Out: 33 [Blood:50]  BLOOD ADMINISTERED:none  DRAINS: Hemovac   INDICATION FOR PROCEDURE: This patient underwent a previous L4-S1 fusion by another surgeon in the remote past. He presented with recurrent back and bilateral leg pain. CT myelogram showed what I felt were probably solid fusions at L4-5 and L5-S1. However he had developed adjacent level stenosis L2-3 and L3-4. He tried medical management without relief. Recommended a decompression and instrumented fusion. Patient understood the risks, benefits, and alternatives and potential outcomes and wished to proceed.  PROCEDURE DETAILS:  The patient was brought to the operating room. After induction of generalized endotracheal anesthesia the patient was rolled into the prone position on chest rolls and all pressure points were padded. The patient's lumbar region was cleaned and then prepped with DuraPrep and draped in the usual sterile fashion. Anesthesia was injected and  then a dorsal midline incision was made and carried down to the lumbosacral fascia. The fascia was opened and the paraspinous musculature was taken down in a subperiosteal fashion to expose L2-3, L3-4, as well as the previous hardware. A self-retaining retractor was placed. Intraoperative fluoroscopy confirmed my level, and I started with removal of the old hardware. I was able to remove the locking caps as well as the cross-link. I then removed the rods. I had planned to remove all 6 screws the screws were extremely difficult to remove with the removal set. Therefore only removed the upper 2 Screws so I could place new screws at that level.   I then turned my attention to the decompression and the spinous process was removed and complete lumbar laminectomies, hemi- facetectomies, and foraminotomies were performed at L2-3 and L3-4. The patient had significant spinal stenosis and this required more work than would be required for a simple exposure of the disc for posterior lumbar interbody fusion. Much more generous decompression was undertaken in order to adequately decompress the neural elements and address the patient's leg pain. The yellow ligament was removed to expose the underlying dura and nerve roots, and generous foraminotomies were performed to adequately decompress the neural elements. Both the exiting and traversing nerve roots were decompressed on both sides until a coronary dilator passed easily along the nerve roots. Once the decompression was complete, I turned my attention to the posterior lower lumbar interbody fusion. The epidural venous vasculature was coagulated and cut sharply. Disc space was incised and the initial discectomy was performed with pituitary rongeurs. The disc space was distracted with sequential distractors to a height of 10 mm. We then used a series of scrapers  and shavers to prepare the endplates for fusion. The midline was prepared with Epstein curettes. Once the complete  discectomy was finished, we packed an appropriate sized peek interbody cage with local autograft and morcellized allograft, gently retracted the nerve root, and tapped the cage into position at L3-4.  The midline between the cages was packed with morselized autograft and allograft. We then turned our attention to the placement of the pedicle screws. The pedicle screw entry zones at L3 were identified utilizing surface landmarks and fluoroscopy. I drilled into each pedicle utilizing the high-speed drill, use the pedicle probe and probed each pedicle, and tapped each pedicle with the appropriate tap. We palpated with a ball probe to assure no break in the cortex. We then placed 6 5 x 45 mm pedicle screws into the pedicles bilaterally at L3. I then placed 6 5 x 45 mm pedicle screws into the old pedicle screw holes at L4. He then checked our placement with AP and lateral fluoroscopy . We then decorticated the transverse processes and laid a mixture of morcellized autograft and allograft out over these to perform intertransverse arthrodesis at L3-4 on the left. We then placed lordotic rods into the multiaxial screw heads of the pedicle screws and locked these in position with the locking caps and anti-torque device. We then checked our construct with AP and lateral fluoroscopy. Irrigated with copious amounts of bacitracin-containing saline solution. Placed a medium Hemovac drain through separate stab incision. Inspected the nerve roots once again to assure adequate decompression, lined to the dura with Gelfoam, and closed the muscle and the fascia with 0 Vicryl. Closed the subcutaneous tissues with 2-0 Vicryl and subcuticular tissues with 3-0 Vicryl. The skin was closed with benzoin and Steri-Strips. Dressing was then applied, the patient was awakened from general anesthesia and transported to the recovery room in stable condition. At the end of the procedure all sponge, needle and instrument counts were correct.    PLAN OF CARE: Admit to inpatient   PATIENT DISPOSITION:  PACU - hemodynamically stable.   Delay start of Pharmacological VTE agent (>24hrs) due to surgical blood loss or risk of bleeding:  yes

## 2013-02-15 LAB — GLUCOSE, CAPILLARY
GLUCOSE-CAPILLARY: 180 mg/dL — AB (ref 70–99)
Glucose-Capillary: 134 mg/dL — ABNORMAL HIGH (ref 70–99)
Glucose-Capillary: 246 mg/dL — ABNORMAL HIGH (ref 70–99)
Glucose-Capillary: 164 mg/dL — ABNORMAL HIGH (ref 70–99)

## 2013-02-15 MED ORDER — ALBUTEROL SULFATE (2.5 MG/3ML) 0.083% IN NEBU: 2.5000 mg | INHALATION_SOLUTION | RESPIRATORY_TRACT | Status: DC | PRN

## 2013-02-15 NOTE — Progress Notes (Signed)
Patient ID: NOVA EVETT, male   DOB: [birth year], 48 y.o.   MRN: [id number] Subjective: Patient reports appropriate back soreness. Some leg aching, Walking and voiding well.  Objective: Vital signs in last 24 hours: Temp:  [97.6 F (36.4 C)-99 F (37.2 C)] 97.9 F (36.6 C) (02/05 0318) Pulse Rate:  [80-102] 88 (02/05 0318) Resp:  [13-18] 16 (02/05 0318) BP: (106-161)/(70-94) 106/70 mmHg (02/05 0318) SpO2:  [93 %-99 %] 93 % (02/05 0318)  Intake/Output from previous day: 02/04 0701 - 02/05 0700 In: 1840 [P.O.:240; I.V.:1600] Out: 960 [Urine:350; Emesis/NG output:100; Drains:310; Blood:200] Intake/Output this shift:    Neurologic: Grossly normal  Lab Results: Lab Results  Component Value Date   WBC 11.3* 02/07/2013   HGB 17.1* 02/07/2013   HCT 48.4 02/07/2013   MCV 86.7 02/07/2013   PLT 191 02/07/2013   Lab Results  Component Value Date   INR 0.89 02/07/2013   BMET Lab Results  Component Value Date   NA 135* 02/07/2013   K 4.6 02/07/2013   CL 96 02/07/2013   CO2 24 02/07/2013   GLUCOSE 106* 02/07/2013   BUN 16 02/07/2013   CREATININE 1.45* 02/07/2013   CALCIUM 10.6* 02/07/2013    Studies/Results: Dg Lumbar Spine 2-3 Views  02/14/2013   CLINICAL DATA:  Posterior lumbar fusion  EXAM: DG C-ARM 1-60 MIN; LUMBAR SPINE - 2-3 VIEW  COMPARISON:  None  FLUOROSCOPY TIME:  41 second  FINDINGS: Two intraoperative fluoroscopic spot images are provided from PLIF from L3 through S1 with bilateral pedicle screws at each level. No hardware failure or complication is demonstrated.  IMPRESSION: PLIF from L3 through S1 without hardware failure or complication.   Electronically Signed   By: Kathreen Devoid   On: 02/14/2013 14:58   Dg C-arm 1-60 Min  02/14/2013   CLINICAL DATA:  Posterior lumbar fusion  EXAM: DG C-ARM 1-60 MIN; LUMBAR SPINE - 2-3 VIEW  COMPARISON:  None  FLUOROSCOPY TIME:  41 second  FINDINGS: Two intraoperative fluoroscopic spot images are provided from PLIF from L3 through S1 with  bilateral pedicle screws at each level. No hardware failure or complication is demonstrated.  IMPRESSION: PLIF from L3 through S1 without hardware failure or complication.   Electronically Signed   By: Kathreen Devoid   On: 02/14/2013 14:58    Assessment/Plan: Seems to be doing as expected. PT/OT. Maybe home tomorrow.   LOS: 1 day    JONES,DAVID S 02/15/2013, 7:59 AM

## 2013-02-15 NOTE — Evaluation (Signed)
Physical Therapy Evaluation Patient Details Name: Jose Carroll MRN: [id number] DOB: 03/16/1965 Today's Date: 02/15/2013 Time: 9323-5573 PT Time Calculation (min): 19 min  PT Assessment / Plan / Recommendation History of Present Illness  Pt is a 48 yo male admitted to [facility] on 02/14/13 for a L2-3 decompression and a hardware removal and PLIF at L3-4.  Pt with 3 previous back surgeries.  Clinical Impression  Pt is POD #1 s/p lumbar surgery.  He is moving well, but is reliant on the RW to support his legs and help stabilize his trunk during gait due to post-op pain.  All back education completed and pt has no STE his home.  He would benefit from RW at discharge.  No further acute or f/u PT needed at this time.  PT to sign off.     PT Assessment  Patent does not need any further PT services    Follow Up Recommendations  No PT follow up    Does the patient have the potential to tolerate intense rehabilitation     NA  Barriers to Discharge   None      Equipment Recommendations  None recommended by PT    Recommendations for Other Services   None  Frequency   NA- one time eval and d/c   Precautions / Restrictions Precautions Precautions: Back Precaution Booklet Issued: Yes (comment) Precaution Comments: Handout given by OT reviewed again with PT.  Required Braces or Orthoses: Spinal Brace Spinal Brace: Applied in sitting position;Lumbar corset   Pertinent Vitals/Pain See vitals flow sheet.      Mobility  Bed Mobility Overal bed mobility: Modified Independent General bed mobility comments: HOB flat, no rails, increased time to complete due to pain and stiffness.  Transfers Overall transfer level: Modified independent Equipment used: Rolling walker (2 wheeled) General transfer comment: Pt given cues for safest hand placement, but he prefers to push up with both hands on RW.  Relied on hands to control transitions.  Ambulation/Gait Ambulation/Gait assistance: Modified  independent (Device/Increase time) Ambulation Distance (Feet): 300 Feet Assistive device: Rolling walker (2 wheeled) Gait Pattern/deviations: Step-through pattern;Decreased stride length General Gait Details: Pt reports his stride is not as long as it is normally, but no signs of leg buckling, foot drag.  He does put a significant amount of weight through his arms during gait.     Exercises Other Exercises Other Exercises: encouraged ambulation 3 times per day at home, encouraged no static sitting standing or lying during his wakeing hours for more than 30-45 mins at a time.        PT Goals(Current goals can be found in the care plan section) Acute Rehab PT Goals Patient Stated Goal: to go home PT Goal Formulation: No goals set, d/c therapy  Visit Information  Last PT Received On: 02/15/13 Assistance Needed: +1 History of Present Illness: Pt is a 48 yo male admitted [facility] on 02/14/13 for a L2-3 decompression and a hardware removal and PLIF at L3-4.  Pt with 3 previous back surgeries.       Prior Paint expects to be discharged to:: Private residence Living Arrangements: Spouse/significant other Available Help at Discharge: Family;Available 24 hours/day Type of Home: Apartment Home Access: Ramped entrance Home Layout: One level Home Equipment: Grab bars - toilet;Grab bars - tub/shower;Shower seat Additional Comments: pt needs reacher/sock aid. Prior Function Level of Independence: Needs assistance Gait / Transfers Assistance Needed: walked short distances w/o assistive device. ADL's / [employer]  Needed: Pt's finance did a lot of home tasks and helped with his socks. Communication / Swallowing Assistance Needed: NA Communication Communication: No difficulties Dominant Hand: Left    Cognition  Cognition Arousal/Alertness: Awake/alert Behavior During Therapy: WFL for tasks assessed/performed Overall Cognitive Status: Within  Functional Limits for tasks assessed    Extremity/Trunk Assessment Upper Extremity Assessment Upper Extremity Assessment: Defer to OT evaluation Lower Extremity Assessment Lower Extremity Assessment: Overall WFL for tasks assessed (does report chronic decreased sensation bil lower legs) Cervical / Trunk Assessment Cervical / Trunk Assessment: Other exceptions Cervical / Trunk Exceptions: this is his 4th back surgery   Balance Balance Overall balance assessment: No apparent balance deficits (not formally assessed)  End of Session PT - End of Session Equipment Utilized During Treatment: Back brace Activity Tolerance: Patient limited by fatigue;Patient limited by pain Patient left: in chair;with call bell/phone within reach Nurse Communication: Mobility status;Other (comment) (need for RW at home)   Wells Guiles B. Donaven Criswell, PT, DPT [phone]   02/15/2013, 3:00 PM

## 2013-02-15 NOTE — Progress Notes (Signed)
Utilization review completed.  

## 2013-02-15 NOTE — Evaluation (Signed)
Occupational Therapy Evaluation and Discharge Summary Patient Details Name: Jose Carroll MRN: [id number] DOB: Jan 01, 1966 Today's Date: 02/15/2013 Time: 2585-2778 OT Time Calculation (min): 22 min  OT Assessment / Plan / Recommendation History of present illness Pt is a 48 yo male admitted for a L2-3 decompression and a hardware removal and PLIF at L3-4.  Pt with 3 previous back surgeries.   Clinical Impression   Pt admitted for above back surgery and overall is doing well with adls.  Pt is very familiar with all adl techniques from previous back surgeries and has assist at home as needed.  Pt does have an 76 mo old daughter that I cautioned him about picking up at this time.  Encouraged pt to avoid picking her up from floor for now and just have her set in his lap.  Pt has all equipment.  No further OT needed.    OT Assessment  Patient does not need any further OT services    Follow Up Recommendations  No OT follow up;Supervision - Intermittent    Barriers to Discharge      Equipment Recommendations  Other (comment) (will check on pts access to AE pack.  )    Recommendations for Other Services    Frequency       Precautions / Restrictions Precautions Precautions: Back Precaution Booklet Issued: Yes (comment) Precaution Comments: No bending, twisting or arching. Required Braces or Orthoses: Spinal Brace Spinal Brace: Applied in sitting position;Lumbar corset Restrictions Weight Bearing Restrictions: No   Pertinent Vitals/Pain Pt c/o 6/10 pain in back.  Nursing aware.    ADL  Eating/Feeding: Performed;Independent Where Assessed - Eating/Feeding: Chair Grooming: Performed;Teeth care;Wash/dry face;Wash/dry hands;Supervision/safety Where Assessed - Grooming: Unsupported standing Upper Body Bathing: Simulated;Set up Where Assessed - Upper Body Bathing: Unsupported sitting Lower Body Bathing: Simulated;Minimal assistance Where Assessed - Lower Body Bathing: Unsupported sit  to stand Upper Body Dressing: Performed;Set up Where Assessed - Upper Body Dressing: Unsupported sitting Lower Body Dressing: Performed;Moderate assistance Where Assessed - Lower Body Dressing: Unsupported sit to stand Toilet Transfer: Performed;Supervision/safety Toilet Transfer Method: Sit to Loss adjuster, chartered: Comfort height toilet;Grab bars Toileting - Clothing Manipulation and Hygiene: Performed;Supervision/safety Where Assessed - Best boy and Hygiene: Standing Equipment Used: Rolling walker;Back brace Transfers/Ambulation Related to ADLs: Pt walked in hallway and in room with S.  ADL Comments: Pt struggles some with LE adls but has for years b/c of his back. Pt introduced to all AE and was told where he could acquire it.  Pt demonstrated I in using all equipment.    OT Diagnosis:    OT Problem List:   OT Treatment Interventions:     OT Goals(Current goals can be found in the care plan section) Acute Rehab OT Goals Patient Stated Goal: to go home.  Visit Information  Last OT Received On: 02/15/13 Assistance Needed: +1 History of Present Illness: Pt is a 48 yo male admitted for a L2-3 decompression and a hardware removal and PLIF at L3-4.  Pt with 3 previous back surgeries.       Prior Malin expects to be discharged to:: Private residence Living Arrangements: Spouse/significant other Available Help at Discharge: Family;Available 24 hours/day Type of Home: Apartment Home Access: Ramped entrance Home Layout: One level Home Equipment: Grab bars - toilet;Grab bars - tub/shower;Shower seat Additional Comments: pt needs reacher/sock aid. Prior Function Level of Independence: Needs assistance Gait / Transfers Assistance Needed: walked short distances w/o assistive  device. ADL's / Homemaking Assistance Needed: Pt's finance did a lot of home tasks and helped with his socks. Communication Communication:  No difficulties Dominant Hand: Right         Vision/Perception     Cognition  Cognition Arousal/Alertness: Awake/alert Behavior During Therapy: WFL for tasks assessed/performed Overall Cognitive Status: Within Functional Limits for tasks assessed    Extremity/Trunk Assessment Upper Extremity Assessment Upper Extremity Assessment: Overall WFL for tasks assessed Lower Extremity Assessment Lower Extremity Assessment: Defer to PT evaluation Cervical / Trunk Assessment Cervical / Trunk Assessment: Normal     Mobility Bed Mobility General bed mobility comments: pt in chair on arrival. Transfers Overall transfer level: Modified independent Equipment used: Rolling walker (2 wheeled) General transfer comment: Pt required S for all transfers but used proper body mechanics.     Exercise     Balance Balance Overall balance assessment: No apparent balance deficits (not formally assessed)   End of Session OT - End of Session Equipment Utilized During Treatment: Rolling walker;Back brace Activity Tolerance: Patient tolerated treatment well Patient left: in chair;with call bell/phone within reach Nurse Communication: Mobility status  GO     Glenford Peers 02/15/2013, 9:46 AM [phone]

## 2013-02-16 LAB — GLUCOSE, CAPILLARY: Glucose-Capillary: 126 mg/dL — ABNORMAL HIGH (ref 70–99)

## 2013-02-16 MED ORDER — CYCLOBENZAPRINE HCL 10 MG PO TABS: 10.0000 mg | ORAL_TABLET | Freq: Three times a day (TID) | ORAL | Status: DC | PRN

## 2013-02-16 MED ORDER — OXYCODONE-ACETAMINOPHEN 5-325 MG PO TABS: 1.0000 | ORAL_TABLET | ORAL | Status: DC | PRN

## 2013-02-16 MED FILL — Heparin Sodium (Porcine) Inj 1000 Unit/ML: INTRAMUSCULAR | Qty: 30 | Status: AC

## 2013-02-16 MED FILL — Sodium Chloride IV Soln 0.9%: INTRAVENOUS | Qty: 1000 | Status: AC

## 2013-02-16 NOTE — Discharge Summary (Signed)
Physician Discharge Summary  Patient ID: Jose Carroll MRN: [id number] DOB/AGE: 1965/09/25 48 y.o.  Admit date: 02/14/2013 Discharge date: 02/16/2013  Admission Diagnoses: Adjacent level stenosis    Discharge Diagnoses: Same   Discharged Condition: good  Hospital Course: The patient was admitted on 02/14/2013 and taken to the operating room where the patient underwent decompressive laminectomy L2-3 and L3-4 with posterior lumbar interbody fusion L3-4. The patient tolerated the procedure well and was taken to the recovery room and then to the floor in stable condition. The hospital course was routine. There were no complications. The wound remained clean dry and intact. Pt had appropriate back soreness. No complaints of leg pain or new N/T/W. The patient remained afebrile with stable vital signs, and tolerated a regular diet. The patient continued to increase activities, and pain was well controlled with oral pain medications.   Consults: None  Significant Diagnostic Studies:  Results for orders placed during the hospital encounter of 02/14/13  GLUCOSE, CAPILLARY      Result Value Range   Glucose-Capillary 112 (*) 70 - 99 mg/dL  GLUCOSE, CAPILLARY      Result Value Range   Glucose-Capillary 194 (*) 70 - 99 mg/dL   Comment 1 Documented in Chart    GLUCOSE, CAPILLARY      Result Value Range   Glucose-Capillary 184 (*) 70 - 99 mg/dL  GLUCOSE, CAPILLARY      Result Value Range   Glucose-Capillary 134 (*) 70 - 99 mg/dL   Comment 1 Notify RN     Comment 2 Documented in Chart    GLUCOSE, CAPILLARY      Result Value Range   Glucose-Capillary 246 (*) 70 - 99 mg/dL   Comment 1 Notify RN     Comment 2 Documented in Chart    GLUCOSE, CAPILLARY      Result Value Range   Glucose-Capillary 164 (*) 70 - 99 mg/dL   Comment 1 Notify RN     Comment 2 Documented in Chart    GLUCOSE, CAPILLARY      Result Value Range   Glucose-Capillary 180 (*) 70 - 99 mg/dL  GLUCOSE, CAPILLARY       Result Value Range   Glucose-Capillary 126 (*) 70 - 99 mg/dL   Comment 1 Notify RN     Comment 2 Documented in Chart      Chest 2 View  02/07/2013   CLINICAL DATA:  Preop.  EXAM: CHEST  2 VIEW  COMPARISON:  None.  FINDINGS: Lungs are clear. Cardiomediastinal silhouette is within normal. There is minimal spondylosis of the spine.  IMPRESSION: No active cardiopulmonary disease.   Electronically Signed   By: Marin Olp M.D.   On: 02/07/2013 15:42   Dg Lumbar Spine 2-3 Views  02/14/2013   CLINICAL DATA:  Posterior lumbar fusion  EXAM: DG C-ARM 1-60 MIN; LUMBAR SPINE - 2-3 VIEW  COMPARISON:  None  FLUOROSCOPY TIME:  41 second  FINDINGS: Two intraoperative fluoroscopic spot images are provided from PLIF from L3 through S1 with bilateral pedicle screws at each level. No hardware failure or complication is demonstrated.  IMPRESSION: PLIF from L3 through S1 without hardware failure or complication.   Electronically Signed   By: Kathreen Devoid   On: 02/14/2013 14:58   Dg C-arm 1-60 Min  02/14/2013   CLINICAL DATA:  Posterior lumbar fusion  EXAM: DG C-ARM 1-60 MIN; LUMBAR SPINE - 2-3 VIEW  COMPARISON:  None  FLUOROSCOPY TIME:  41 second  FINDINGS:  Two intraoperative fluoroscopic spot images are provided from PLIF from L3 through S1 with bilateral pedicle screws at each level. No hardware failure or complication is demonstrated.  IMPRESSION: PLIF from L3 through S1 without hardware failure or complication.   Electronically Signed   By: Kathreen Devoid   On: 02/14/2013 14:58    Antibiotics:  Anti-infectives   Start     Dose/Rate Route Frequency Ordered Stop   02/14/13 2200  vancomycin (VANCOCIN) IVPB 1000 mg/200 mL premix     1,000 mg 200 mL/hr over 60 Minutes Intravenous Every 12 hours 02/14/13 1651 02/15/13 1041   02/14/13 1615  ceFAZolin (ANCEF) IVPB 1 g/50 mL premix  Status:  Discontinued     1 g 100 mL/hr over 30 Minutes Intravenous Every 8 hours 02/14/13 1609 02/14/13 1624   02/14/13 1231  bacitracin  50,000 Units in sodium chloride irrigation 0.9 % 500 mL irrigation  Status:  Discontinued       As needed 02/14/13 1232 02/14/13 1405   02/14/13 0600  vancomycin (VANCOCIN) IVPB 1000 mg/200 mL premix     1,000 mg 200 mL/hr over 60 Minutes Intravenous On call to O.R. 02/13/13 1426 02/14/13 0953      Discharge Exam: Blood pressure 116/70, pulse 91, temperature 97.2 F (36.2 C), temperature source Oral, resp. rate 16, SpO2 96.00%. Neurologic: Grossly normal Sitting clean dry and intact  Discharge Medications:     Medication List         albuterol 108 (90 BASE) MCG/ACT inhaler  Commonly known as:  PROVENTIL HFA;VENTOLIN HFA  Inhale 1 puff into the lungs every 6 (six) hours.     amLODipine-benazepril 10-40 MG per capsule  Commonly known as:  LOTREL  Take 1 capsule by mouth daily.     chlorthalidone 25 MG tablet  Commonly known as:  HYGROTON  Take 12.5 mg by mouth daily.     cyclobenzaprine 10 MG tablet  Commonly known as:  FLEXERIL  Take 1 tablet (10 mg total) by mouth 3 (three) times daily as needed for muscle spasms.     DEXILANT 60 MG capsule  Generic drug:  dexlansoprazole  Take 60 mg by mouth every other day.     fenofibrate 145 MG tablet  Commonly known as:  TRICOR  Take 145 mg by mouth daily.     gabapentin 600 MG tablet  Commonly known as:  NEURONTIN  Take 1,200 mg by mouth 3 (three) times daily.     HYDROcodone-acetaminophen 5-325 MG per tablet  Commonly known as:  NORCO/VICODIN  Take 2 tablets by mouth every 6 (six) hours as needed for moderate pain.     metFORMIN 1000 MG tablet  Commonly known as:  GLUCOPHAGE  Take 1,000 mg by mouth 2 (two) times daily with a meal.     oxyCODONE-acetaminophen 5-325 MG per tablet  Commonly known as:  PERCOCET/ROXICET  Take 1-2 tablets by mouth every 4 (four) hours as needed for moderate pain.     sitaGLIPtin 100 MG tablet  Commonly known as:  JANUVIA  Take 100 mg by mouth every morning.        Disposition:  Home   Final Dx: PLIF L3-4      Discharge Orders   Future Orders Complete By Expires   Call MD for:  difficulty breathing, headache or visual disturbances  As directed    Call MD for:  persistant nausea and vomiting  As directed    Call MD for:  redness, tenderness, or signs  of infection (pain, swelling, redness, odor or green/yellow discharge around incision site)  As directed    Call MD for:  severe uncontrolled pain  As directed    Call MD for:  temperature >100.4  As directed    Diet - low sodium heart healthy  As directed    Discharge instructions  As directed    Comments:     No heavy lifting, no twisting or bending, no driving, may shower normally   Increase activity slowly  As directed       Follow-up Information   Follow up with Prudencio Velazco S, MD. Schedule an appointment as soon as possible for a visit in 3 weeks.   Specialty:  Neurosurgery   Contact information:   [address] [place] [address] [phone]        Signed: Eustace Moore 02/16/2013, 10:17 AM

## 2013-02-16 NOTE — Progress Notes (Signed)
Pt doing well. Pt given D/C instructions with Rx's, verbal understanding was given. Pt received rolling walker prior to D/C. Pt D/C'd home via wheelchair @ 1125 per MD order. Pt was stable @ D/C and had no other needs. Holli Humbles, RN

## 2013-02-19 LAB — GLUCOSE, CAPILLARY: Glucose-Capillary: 177 mg/dL — ABNORMAL HIGH (ref 70–99)

## 2014-04-30 NOTE — Op Note (Signed)
PATIENT NAME:  Jose Carroll, Jose Carroll MR#:  [id number] DATE OF BIRTH:  09/07/1965  DATE OF PROCEDURE:  08/12/2011  PREOPERATIVE DIAGNOSIS: Incarcerated right inguinal hernia.   POSTOPERATIVE DIAGNOSIS: Incarcerated right inguinal hernia.   PROCEDURES:  1. Repair of incarcerated inguinal hernia with mesh.  2. Partial omentectomy which was stuck in the whole scrotal area and the right groin area and almost about one quarter of the omentum was stuck in there.   SURGEON: Randie Bloodgood S. Gaje Tennyson, MD  DESCRIPTION OF PROCEDURE: This patient was seen by me in my office who had this hernia for the last six years. It is getting worse and now he was having more and more pain and he said this mass could not be reduced. Patient then put to sleep with general anesthesia. The right groin was then prepped and draped. Small incision was made. After cutting skin and subcutaneous tissue, Scarpa's fascia was cut. The veins were then ligated. The external oblique was dilated, was very stretched out. Incision was made over the external oblique and the external oblique was opened. Patient had a very large cord structure with a hernia stuck into it and it was then lifted up with a Penrose drain. After this was lifted up, after dissecting the hernia sac, the whole thing was then delivered into the right groin area. I opened the sac. Patient was found to have almost all of his omentum in it. Part of omentum I could push back, rest I could not push back so I did a partial omentectomy with multiple Kelly clamps and cutting off the omentum and tying it off and then reduced the omentum back into the abdomen. The hernia sac was then separated from the rest of the cord structures very nicely and then it was high ligated and pushed back in the abdomen. After this was done there was a big hole because of the stretching done by the omentum over these years. I put a plug in. Plug was sutured to the muscle around it very nicely and after that mesh was  then put on top of it, sutured to the pubic tubercle area and then shelving edge of inguinal ligament and then to the conjoined tendon and then around the cord with 0 Surgilon sutures. The fascia was then closed with 0 Vicryl sutures. Subcuticular suturing was done with 3-0 Vicryl and staples applied. Marcaine was injected.      Patient tolerated well. Sent to recovery room in satisfactory condition.   ____________________________ Welford Roche Phylis Bougie, MD msh:cms D: 08/12/2011 09:15:01 ET T: 08/12/2011 11:46:44 ET JOB#: [id number]  cc: Inaya Gillham S. Phylis Bougie, MD, <Dictator> Venetia Maxon. Elijio Miles, MD Sharene Butters MD ELECTRONICALLY SIGNED 08/13/2011 6:29

## 2014-05-05 NOTE — Discharge Summary (Signed)
PATIENT NAME:  Jose Carroll, Jose Carroll MR#:  [id number] DATE OF BIRTH:  12/26/1965  DATE OF ADMISSION:  05/06/2011 DATE OF DISCHARGE:  05/06/2011  ADMITTING DIAGNOSIS: Chest pain.   DISCHARGE DIAGNOSES:  1. Chest pain, unclear etiology at this time, likely chronic obstructive pulmonary disease exacerbation-related, noncardiac likely. Cardiac enzymes are negative as well as the patient's Myoview.  2. Chronic obstructive pulmonary disease exacerbation, mild.  3. Acute bronchitis.  4. Hypertension.  5. Thrombocytopenia.  6. Gastroesophageal reflux disease.   DISCHARGE CONDITION: Fair.   DISCHARGE MEDICATIONS:  1. The patient is to start Zithromax 250 mg p.o. daily for 4 more days. The patient is to start this medication on 05/07/2011 as the patient is already given 500 mg today on the 05/06/2011.  2. He is also to start Combivent 2 puffs every 4 hours as needed.  3. Norvasc 10 mg p.o. daily.  4. Nicotine patch 7 mg topically daily.  5. Omeprazole 20 mg p.o. daily for acid reflux.   DIET: 2 grams salt, low fat, low cholesterol.   PHYSICAL ACTIVITY LIMITATIONS: As tolerated.   OTHER: The patient was advised to quit smoking.   FOLLOWUP:  1. The patient is to follow up with Dr. Humphrey Rolls, Cardiology, on 05/13/2011.  2. He is to follow up with [facility] in two days after discharge.   CONSULTANTS: Dr. Humphrey Rolls.   LABORATORY, DIAGNOSTIC AND RADIOLOGICAL DATA:  Chest, portable single view, on 04/242013 showed no acute abnormality. CT of chest for pulmonary embolism on 05/06/2011 revealed no evidence of pulmonary embolus. Bilateral emphysematous changes were noted.  Nuclear Medicine study Myoview stress test on 05/06/2010 showed a normal study with normal left ventricular systolic function of 12%.  His lab data showed elevated glucose to 175, otherwise BMP was unremarkable.  The patient's liver enzymes showed mild elevation of AST to 78 as well as ALT to 92, otherwise unremarkable.  The  patient's cardiac enzymes x2 were unremarkable.  CBC: White blood cell count was 7.0, hemoglobin was 16.1, platelet count 127.  Coagulation panel revealed a pro time of 12.4, INR 0.9. Activated PTT 38.4.  D-dimer was slightly elevated at 0.96.   HISTORY AND PHYSICAL: The patient is a 49 year old Caucasian male with no significant past medical history except of smoking abuse who presented to the hospital with complaints of chest pain. Please refer to Dr. Zacarias Pontes admission note on 05/06/2011. He was also having cough, chest congestion and sputum production. He was also noted to be hypertensive with systolic blood pressure approaching close to 200s initially.   On arrival to the hospital, the patient's temperature was 98, pulse 90, respiration rate was 15, blood pressure 169/91. Saturation was 97% on room air. Physical exam revealed diminished breath sounds bilaterally.   HOSPITAL COURSE: As the patient's chest pain was increasing with cough, he underwent a CT scan of chest to rule out pulmonary embolism and it was negative. The patient then underwent a cardiac stress test which was also negative for coronary artery disease inducible ischemia. It was felt that the patient's chest pain was very unlikely cardiac and very possibly related to mild chronic obstructive pulmonary disease exacerbation by acute bronchitis The patient was initiated on antibiotic therapy with Zithromax. He is to continue antibiotics for four more days. He is also to start on inhalation therapy with Combivent as well as stop smoking, and that was communicated to him. He also is going to be given nicotine replacement therapy upon discharge. The patient was advised to  establish primary care physician and be seen in the next few days after discharge for further recommendations.   For hypertension, initially in the hospital he was continued on metoprolol as well as Norvasc. His Norvasc dose was advanced to 10 mg p.o. daily dose. He is to  follow up with his primary care physician and make decisions about initiation of any other medications to control his blood pressure.   The patient was noted to be thrombocytopenic with platelet count at around 127. His liver enzymes were also abnormal. The patient is to follow up with his primary care physician and make decisions about investigation of his elevated transaminase level as well as thrombocytopenia.   The patient was noted to be hyperglycemic with admission glucose level 175. Hemoglobin A1c was checked and was found to be high at 7.8. Unfortunately, we did not notice results yet upon discharge, so no recommendations were made for him in regards to his diet. However, the patient is to follow up with his primary care physician and should be been advised to follow a strict diet as he may be prediabetic or diabetic.   The patient is being discharged in stable condition with the above-mentioned medications and follow-up. His vital signs on the day of discharge: Temperature 97.9, pulse 91, respiratory rate was 18, blood pressure 132/85. Saturation was 97% on room air at rest.   TIME SPENT: 40 minutes.   ____________________________ Theodoro Grist, MD rv:cbb D: 05/06/2011 17:06:05 ET T: 05/07/2011 10:47:40 ET JOB#: [id number]  cc: Theodoro Grist, MD, <Dictator> [facility] [place] MD ELECTRONICALLY SIGNED 05/22/2011 14:10

## 2014-05-05 NOTE — H&P (Signed)
PATIENT NAME:  Jose Carroll, Jose Carroll MR#:  [id number] DATE OF BIRTH:  06-Feb-1965  DATE OF ADMISSION:  05/06/2011  PRIMARY CARE PHYSICIAN: The patient has no primary care physician.   CHIEF COMPLAINT: Chest pain.   HISTORY OF PRESENT ILLNESS: Jose Carroll is a 49 year old Caucasian male who presented to the Emergency Department with a two day history of chest pain located at the center of his chest radiating to both upper arms. The pain is described as squeezing the heart and has a throbbing quality, constant in nature, reaching severity of 8 to 10 on a scale of 10. After receiving good pain control in the Emergency Department the pain was down to 6. The patient also indicated that the chest pain also increased upon coughing. The patient also was found to be hypertensive. He stated that his initial blood pressure reached 200. Subsequent blood pressure was down to 696 systolic. The patient denies having any fever, no vomiting, no diarrhea.   REVIEW OF SYSTEMS: CONSTITUTIONAL: No fever. No chills. No night sweats. No fatigue. EYES: No blurring of vision. No double vision. ENT: No hearing impairment. He has mild sore throat. No dysphagia. No dizziness. CARDIOVASCULAR: Reports chest pain as above. No edema. No syncope. RESPIRATORY: No sputum production. Reports chest pain as above. No hemoptysis. GASTROINTESTINAL: No abdominal pain. No vomiting, no diarrhea. GENITOURINARY: No dysuria. No frequency of urination. MUSCULOSKELETAL: No joint pain or swelling. No muscular pain or swelling. INTEGUMENTARY: No skin rash. No ulcers. NEUROLOGY: No focal weakness. No seizure activity. No headache. PSYCHIATRY: No anxiety. No depression. ENDOCRINE: No polyuria, no polydipsia. No heat or cold intolerance.   PAST MEDICAL HISTORY: History of disk fusion, L5-S1 operated in 2006. There is no prior history of diabetes or coronary artery disease. No history of hypertension. The patient states that he had one episode of elevated blood  pressure in the past, but normalized after that.   PAST SURGICAL HISTORY: History of back surgery for herniated disk disease in 2006.   SOCIAL HABITS: Chronic smoker, one pack lasts him a week. He started smoking at age of 54. No history of alcohol or drug abuse.   SOCIAL HISTORY: He is single, lives at home alone. He lives on disability based on his back problem.   FAMILY HISTORY: His mother died at the age of 19 from complications of prior coronary artery disease, heart attacks and strokes in her 37s. His father died at the age of 10 from complications of a brain aneurysm. He also had hypertension and diabetes.   ADMISSION MEDICATIONS: None except over-the-counter Tylenol.   ALLERGIES: Penicillin. He also has side effects with naproxen causing stomach problems.   PHYSICAL EXAMINATION:  VITAL SIGNS: Blood pressure 169/91, respiratory rate 15, pulse 90, temperature 98, oxygen saturation 97%.   GENERAL APPEARANCE: Young male lying in bed in no acute distress.   HEAD AND NECK EXAMINATION: No pallor. No icterus. No cyanosis.   ENT: Hearing was normal. Nasal mucosa, lips, tongue were normal. The patient is edentulous.   EYES: Normal eyelids and conjunctiva. Pupils about 4 mm, equal and reactive to light.   NECK: Supple. Trachea at midline. No thyromegaly. No cervical lymphadenopathy. No masses.   HEART: Normal S1, S2. No S3, S4. No murmur. No gallop. No carotid bruits.   RESPIRATORY: Normal breathing pattern without use of accessory muscles. No rales. No wheezing.   ABDOMEN: Soft without tenderness. No hepatosplenomegaly. No masses. No hernias.   MUSCULOSKELETAL: No joint swelling. No clubbing.   SKIN:  No ulcers. No subcutaneous nodules.   NEUROLOGIC: Cranial nerves II through XII are intact. No focal motor deficit.   PSYCHIATRY: The patient is alert and oriented x3. Mood and affect were normal.   LABORATORY, DIAGNOSTIC, AND RADIOLOGICAL DATA: EKG showed normal sinus rhythm at  rate of 97 per minute. Normal findings. Serum glucose was 175, BUN 4, creatinine 1.1, sodium 136, potassium 3.6. His liver function tests showed total protein 7.7, albumin 4, bilirubin 0.4, alkaline phosphatase 136, AST 78, ALT elevated also at 92. Total CPK 131. Troponin less than 0.02. CBC showed white count 7,000, hemoglobin 16, hematocrit 48, platelet count 127. Prothrombin time 12. INR 0.9. APTT slightly elevated at 38. D-dimer elevated at 0.96. CTA of the chest revealed no definite evidence of pulmonary embolus. Mild emphysematous changes.   ASSESSMENT:  1. Chest pain for evaluation.  2. Hypertension. The patient is not known to be hypertensive.  3. Elevated liver enzymes. 4. Mild thrombocytopenia.  5. History of herniated disk disease at L5-S1, underwent fusion in 2006.   PLAN:  1. We will admit the patient to the medical floor for observation.  2. Follow-up on cardiac enzymes.  3. Will schedule for stress test.  4. The patient needs to quit smoking. I will place him on nicotine patch.  5. For his elevated liver enzymes, I will check viral hepatitis screen, and I will also check HIV status.  6. I also noted elevated blood sugar and, therefore, I will check his hemoglobin A1c to ensure there is no underlying diabetes mellitus.   TIME SPENT EVALUATING THIS PATIENT: More than one hour.  ____________________________ Clovis Pu. Lenore Manner, MD amd:ap D: 05/06/2011 03:31:22 ET T: 05/06/2011 10:09:24 ET JOB#: [id number]  cc: Clovis Pu. Lenore Manner, MD, <Dictator> Mike Craze Irven Coe MD ELECTRONICALLY SIGNED 05/13/2011 22:08

## 2014-06-11 ENCOUNTER — Encounter: Payer: Self-pay | Admitting: Dietician

## 2014-06-11 ENCOUNTER — Encounter: Payer: Medicare Other | Attending: Internal Medicine | Admitting: Dietician

## 2014-06-11 VITALS — BP 124/73 | Ht 70.0 in | Wt 209.8 lb

## 2014-06-11 DIAGNOSIS — E1142 Type 2 diabetes mellitus with diabetic polyneuropathy: Secondary | ICD-10-CM | POA: Insufficient documentation

## 2014-06-11 NOTE — Progress Notes (Signed)
Diabetes Self-Management Education  Visit Type: First/Initial  Appt. Start Time: 1000 Appt. End Time: 1100  06/11/2014  Mr. Jose Carroll, identified by name and date of birth, is a 49 y.o. male with a diagnosis of Diabetes: Type 2.  Other people present during visit:      ASSESSMENT  Blood pressure 124/73, height 5\' 10"  (1.778 m), weight 209 lb 12.8 oz (95.165 kg). Body mass index is 30.1 kg/(m^2).  Initial Visit Information:  Are you currently following a meal plan?: Yes (decreased intake of starches and processed foods)   Are you taking your medications as prescribed?: Yes Are you checking your feet?: Yes How many days per week are you checking your feet?: 2 How often do you need to have someone help you when you read instructions, pamphlets, or other written materials from your doctor or pharmacy?: 1 - Never What is the last grade level you completed in school?: 8  Psychosocial:     Patient Belief/Attitude about Diabetes: Motivated to manage diabetes Self-care barriers: Unsteady gait/risk for falls Special Needs: None Preferred Learning Style: Auditory, Visual, Hands on Learning Readiness: Ready  Complications:   Last HgB A1C per patient/outside source: 7.7% (05-27-14) How often do you check your blood sugar?: 2 times/day Fasting Blood glucose range (mg/dL): 160's Postprandial Blood glucose range (mg/dL): 160's Have you had a dilated eye exam in the past 12 months?: No Have you had a dental exam in the past 12 months?: No  Diet Intake:  Breakfast:  (eats 3 meals/day and bedtime snack)-has decreased intake of starches and processed foods and sweets and sweetened beverages Snack (evening):  (eats crackers and peanut butter at bedtime snack)  Exercise:  Exercise: Light (walking / raking leaves) (walks 15-20 min daily) Light Exercise amount of time (min / week): 115  Individualized Plan for Diabetes Self-Management Training:   Learning Objective:  Patient will  have a greater understanding of diabetes self-management.  Patient education plan per assessed needs and concerns is to attend individual sessions for     Education Topics Reviewed with Patient Today:  Definition of diabetes, type 1 and 2, and the diagnosis of diabetes Role of diet in the treatment of diabetes and the relationship between the three main macronutrients and blood glucose level, Reviewed blood glucose goals for pre and post meals and how to evaluate the patients' food intake on their blood glucose level. (portion sizes and basic carb counting) Role of exercise on diabetes management, blood pressure control and cardiac health., Helped patient identify appropriate exercises in relation to his/her diabetes, diabetes complications and other health issue. Reviewed patients medication for diabetes, action, purpose, timing of dose and side effects. Purpose and frequency of SMBG., Yearly dilated eye exam, Taught/discussed recording of test results and interpretation of SMBG. Taught treatment of hypoglycemia - the 15 rule., Discussed and identified patients' treatment of hyperglycemia. Relationship between chronic complications and blood glucose control, Identified and discussed with patient  current chronic complications, Retinopathy and reason for yearly dilated eye exams, Lipid levels, blood glucose control and heart disease, Reviewed with patient heart disease, higher risk of, and prevention Role of stress on diabetes   Review risk of smoking and offered smoking cessation, Lifestyle issues that need to be addressed for better diabetes care (needs to schedule eye appt ; encouraged to quit smoking)  PATIENTS GOALS/Plan (Developed by the patient):  Nutrition: Follow meal plan discussed, General guidelines for healthy choices and portions discussed Physical Activity: Exercise 7 days per week, 15-20 minutes  per day Medications: take my medication as prescribed Monitoring : test my blood  glucose as discussed (note 2x per day with comment) (check FBG and 2 hr pp supper) Reducing Risk: stop smoking, examine blood glucose patterns Take Metformin with breakfast and supper    Expected Outcomes:     Education material provided: General meal planning guidelines; Smoking Cessation classes  If problems or questions, patient to contact team via:  [phone]  Future DSME appointment:  (07-04-14 for class 1)

## 2014-07-04 ENCOUNTER — Ambulatory Visit: Payer: Medicare Other

## 2014-07-10 ENCOUNTER — Other Ambulatory Visit: Payer: Self-pay | Admitting: Nurse Practitioner

## 2014-07-10 DIAGNOSIS — M79672 Pain in left foot: Secondary | ICD-10-CM

## 2014-07-11 ENCOUNTER — Ambulatory Visit: Payer: Medicare Other

## 2014-07-22 ENCOUNTER — Ambulatory Visit: Payer: Medicare Other

## 2014-07-25 ENCOUNTER — Ambulatory Visit: Payer: Medicare Other

## 2014-07-29 ENCOUNTER — Ambulatory Visit: Payer: Medicare Other

## 2014-07-30 ENCOUNTER — Encounter: Payer: Self-pay | Admitting: Dietician

## 2014-08-05 ENCOUNTER — Ambulatory Visit: Payer: Medicare Other

## 2014-09-10 ENCOUNTER — Ambulatory Visit
Admission: RE | Admit: 2014-09-10 | Discharge: 2014-09-10 | Disposition: A | Discharge status: Home / Self Care | Payer: Medicare Other | Source: Ambulatory Visit | Attending: Cardiovascular Disease | Admitting: Cardiovascular Disease

## 2014-09-10 ENCOUNTER — Encounter
Admission: RE | Disposition: A | Discharge status: Home / Self Care | Payer: Self-pay | Source: Ambulatory Visit | Attending: Cardiovascular Disease

## 2014-09-10 DIAGNOSIS — Z8049 Family history of malignant neoplasm of other genital organs: Secondary | ICD-10-CM | POA: Diagnosis not present

## 2014-09-10 DIAGNOSIS — Z7982 Long term (current) use of aspirin: Secondary | ICD-10-CM | POA: Insufficient documentation

## 2014-09-10 DIAGNOSIS — N529 Male erectile dysfunction, unspecified: Secondary | ICD-10-CM | POA: Diagnosis not present

## 2014-09-10 DIAGNOSIS — Z807 Family history of other malignant neoplasms of lymphoid, hematopoietic and related tissues: Secondary | ICD-10-CM | POA: Insufficient documentation

## 2014-09-10 DIAGNOSIS — R079 Chest pain, unspecified: Secondary | ICD-10-CM | POA: Insufficient documentation

## 2014-09-10 DIAGNOSIS — Z9889 Other specified postprocedural states: Secondary | ICD-10-CM | POA: Diagnosis not present

## 2014-09-10 DIAGNOSIS — J449 Chronic obstructive pulmonary disease, unspecified: Secondary | ICD-10-CM | POA: Insufficient documentation

## 2014-09-10 DIAGNOSIS — F17210 Nicotine dependence, cigarettes, uncomplicated: Secondary | ICD-10-CM | POA: Diagnosis not present

## 2014-09-10 DIAGNOSIS — Z79899 Other long term (current) drug therapy: Secondary | ICD-10-CM | POA: Diagnosis not present

## 2014-09-10 DIAGNOSIS — K219 Gastro-esophageal reflux disease without esophagitis: Secondary | ICD-10-CM | POA: Insufficient documentation

## 2014-09-10 DIAGNOSIS — Z8051 Family history of malignant neoplasm of kidney: Secondary | ICD-10-CM | POA: Insufficient documentation

## 2014-09-10 DIAGNOSIS — I10 Essential (primary) hypertension: Secondary | ICD-10-CM | POA: Insufficient documentation

## 2014-09-10 DIAGNOSIS — Z8249 Family history of ischemic heart disease and other diseases of the circulatory system: Secondary | ICD-10-CM | POA: Insufficient documentation

## 2014-09-10 DIAGNOSIS — Z880 Allergy status to penicillin: Secondary | ICD-10-CM | POA: Diagnosis not present

## 2014-09-10 DIAGNOSIS — E1140 Type 2 diabetes mellitus with diabetic neuropathy, unspecified: Secondary | ICD-10-CM | POA: Diagnosis not present

## 2014-09-10 DIAGNOSIS — Z833 Family history of diabetes mellitus: Secondary | ICD-10-CM | POA: Diagnosis not present

## 2014-09-10 DIAGNOSIS — I2582 Chronic total occlusion of coronary artery: Secondary | ICD-10-CM | POA: Diagnosis present

## 2014-09-10 DIAGNOSIS — I2510 Atherosclerotic heart disease of native coronary artery without angina pectoris: Secondary | ICD-10-CM | POA: Insufficient documentation

## 2014-09-10 DIAGNOSIS — Z7902 Long term (current) use of antithrombotics/antiplatelets: Secondary | ICD-10-CM | POA: Insufficient documentation

## 2014-09-10 DIAGNOSIS — I259 Chronic ischemic heart disease, unspecified: Secondary | ICD-10-CM | POA: Insufficient documentation

## 2014-09-10 DIAGNOSIS — E785 Hyperlipidemia, unspecified: Secondary | ICD-10-CM | POA: Insufficient documentation

## 2014-09-10 DIAGNOSIS — Z981 Arthrodesis status: Secondary | ICD-10-CM | POA: Diagnosis not present

## 2014-09-10 DIAGNOSIS — M1990 Unspecified osteoarthritis, unspecified site: Secondary | ICD-10-CM | POA: Diagnosis not present

## 2014-09-10 HISTORY — PX: CARDIAC CATHETERIZATION: SHX172

## 2014-09-10 SURGERY — LEFT HEART CATH
Anesthesia: Moderate Sedation

## 2014-09-10 MED ORDER — HEPARIN (PORCINE) IN NACL 2-0.9 UNIT/ML-% IJ SOLN
INTRAMUSCULAR | Status: AC
  Filled 2014-09-10: qty 1000

## 2014-09-10 MED ORDER — MIDAZOLAM HCL 2 MG/2ML IJ SOLN
INTRAMUSCULAR | Status: AC
  Filled 2014-09-10: qty 2

## 2014-09-10 MED ORDER — SODIUM CHLORIDE 0.9 % WEIGHT BASED INFUSION: 1.0000 mL/kg/h | INTRAVENOUS | Status: DC

## 2014-09-10 MED ORDER — SODIUM CHLORIDE 0.9 % IV SOLN: 250.0000 mL | INTRAVENOUS | Status: DC | PRN

## 2014-09-10 MED ORDER — MIDAZOLAM HCL 2 MG/2ML IJ SOLN
INTRAMUSCULAR | Status: DC | PRN
  Administered 2014-09-10 (×2): 1 mg via INTRAVENOUS

## 2014-09-10 MED ORDER — FENTANYL CITRATE (PF) 100 MCG/2ML IJ SOLN
INTRAMUSCULAR | Status: AC
  Filled 2014-09-10: qty 2

## 2014-09-10 MED ORDER — SODIUM CHLORIDE 0.9 % IJ SOLN: 3.0000 mL | INTRAMUSCULAR | Status: DC | PRN

## 2014-09-10 MED ORDER — FENTANYL CITRATE (PF) 100 MCG/2ML IJ SOLN
INTRAMUSCULAR | Status: DC | PRN
  Administered 2014-09-10 (×2): 50 ug via INTRAVENOUS

## 2014-09-10 MED ORDER — SODIUM CHLORIDE 0.9 % IJ SOLN: 3.0000 mL | Freq: Two times a day (BID) | INTRAMUSCULAR | Status: DC

## 2014-09-10 MED ORDER — ASPIRIN 81 MG PO CHEW: 81.0000 mg | CHEWABLE_TABLET | ORAL | Status: DC

## 2014-09-10 MED ORDER — ONDANSETRON HCL 4 MG/2ML IJ SOLN: 4.0000 mg | Freq: Four times a day (QID) | INTRAMUSCULAR | Status: DC | PRN

## 2014-09-10 MED ORDER — SODIUM CHLORIDE 0.9 % IV SOLN
INTRAVENOUS | Status: DC
Start: 2014-09-10 — End: 2014-09-10
  Administered 2014-09-10 (×2): via INTRAVENOUS

## 2014-09-10 MED ORDER — ACETAMINOPHEN 325 MG PO TABS: 650.0000 mg | ORAL_TABLET | ORAL | Status: DC | PRN

## 2014-09-10 MED ORDER — IOHEXOL 300 MG/ML  SOLN
INTRAMUSCULAR | Status: DC | PRN
  Administered 2014-09-10: 130 mL via INTRA_ARTERIAL

## 2014-09-10 SURGICAL SUPPLY — 10 items
CATH INFINITI 5 FR 3DRC (CATHETERS) ×2 IMPLANT
CATH INFINITI 5FR ANG PIGTAIL (CATHETERS) ×2 IMPLANT
CATH INFINITI 5FR JL4 (CATHETERS) ×2 IMPLANT
CATH INFINITI JR4 5F (CATHETERS) ×2 IMPLANT
DEVICE CLOSURE MYNXGRIP 5F (Vascular Products) ×2 IMPLANT
KIT MANI 3VAL PERCEP (MISCELLANEOUS) ×2 IMPLANT
NEEDLE PERC 18GX7CM (NEEDLE) ×2 IMPLANT
PACK CARDIAC CATH (CUSTOM PROCEDURE TRAY) ×2 IMPLANT
SHEATH PINNACLE 5F 10CM (SHEATH) ×2 IMPLANT
WIRE EMERALD 3MM-J .035X150CM (WIRE) ×2 IMPLANT

## 2014-09-10 NOTE — Discharge Instructions (Signed)
Coronary Angiogram °A coronary angiogram, also called coronary angiography, is an X-ray procedure used to look at the arteries in the heart. In this procedure, a dye (contrast dye) is injected through a long, hollow tube (catheter). The catheter is about the size of a piece of cooked spaghetti and is inserted through your groin, wrist, or arm. The dye is injected into each artery, and X-rays are then taken to show if there is a blockage in the arteries of your heart. °LET YOUR HEALTH CARE PROVIDER KNOW ABOUT: °· Any allergies you have, including allergies to shellfish or contrast dye.   °· All medicines you are taking, including vitamins, herbs, eye drops, creams, and over-the-counter medicines.   °· Previous problems you or members of your family have had with the use of anesthetics.   °· Any blood disorders you have.   °· Previous surgeries you have had. °· History of kidney problems or failure.   °· Other medical conditions you have. °RISKS AND COMPLICATIONS  °Generally, a coronary angiogram is a safe procedure. However, problems can occur and include: °· Allergic reaction to the dye. °· Bleeding from the access site or other locations. °· Kidney injury, especially in people with impaired kidney function.  °· Stroke (rare). °· Heart attack (rare). °BEFORE THE PROCEDURE  °· Do not eat or drink anything after midnight the night before the procedure or as directed by your health care provider.   °· Ask your health care provider about changing or stopping your regular medicines. This is especially important if you are taking diabetes medicines or blood thinners. °PROCEDURE °· You may be given a medicine to help you relax (sedative) before the procedure. This medicine is given through an intravenous (IV) access tube that is inserted into one of your veins.   °· The area where the catheter will be inserted will be washed and shaved. This is usually done in the groin but may be done in the fold of your arm (near your  elbow) or in the wrist.    °· A medicine will be given to numb the area where the catheter will be inserted (local anesthetic).   °· The health care provider will insert the catheter into an artery. The catheter will be guided by using a special type of X-ray (fluoroscopy) of the blood vessel being examined.   °· A special dye will then be injected into the catheter, and X-rays will be taken. The dye will help to show where any narrowing or blockages are located in the heart arteries.   °AFTER THE PROCEDURE  °· If the procedure is done through the leg, you will be kept in bed lying flat for several hours. You will be instructed to not bend or cross your legs. °· The insertion site will be checked frequently.   °· The pulse in your feet or wrist will be checked frequently.   °· Additional blood tests, X-rays, and an electrocardiogram may be done.   °Document Released: 07/04/2002 Document Revised: 05/14/2013 Document Reviewed: 05/22/2012 °[employer]® Patient Information ©2015 [employer]. This information is not intended to replace advice given to you by your health care provider. Make sure you discuss any questions you have with your health care provider. ° °

## 2014-09-12 ENCOUNTER — Encounter: Payer: Self-pay | Admitting: Cardiovascular Disease

## 2015-02-13 ENCOUNTER — Telehealth: Payer: Self-pay | Admitting: Pain Medicine

## 2015-02-13 ENCOUNTER — Ambulatory Visit: Payer: Medicare Other | Admitting: Pain Medicine

## 2015-02-13 NOTE — Telephone Encounter (Signed)
Transportation Lucianne Lei did not pick up today, he resched for 03-06-15 at 9:00

## 2015-02-13 NOTE — Telephone Encounter (Signed)
Thank you :)

## 2015-03-06 ENCOUNTER — Ambulatory Visit: Payer: Medicare Other | Attending: Pain Medicine | Admitting: Pain Medicine

## 2015-03-06 ENCOUNTER — Encounter (INDEPENDENT_AMBULATORY_CARE_PROVIDER_SITE_OTHER): Payer: Self-pay

## 2015-03-06 ENCOUNTER — Encounter: Payer: Self-pay | Admitting: Pain Medicine

## 2015-03-06 VITALS — BP 111/73 | HR 71 | Temp 96.7°F | Resp 16 | Ht 70.0 in | Wt 204.0 lb

## 2015-03-06 DIAGNOSIS — M4806 Spinal stenosis, lumbar region: Secondary | ICD-10-CM | POA: Diagnosis not present

## 2015-03-06 DIAGNOSIS — F17200 Nicotine dependence, unspecified, uncomplicated: Secondary | ICD-10-CM | POA: Insufficient documentation

## 2015-03-06 DIAGNOSIS — Z9889 Other specified postprocedural states: Secondary | ICD-10-CM

## 2015-03-06 DIAGNOSIS — M48061 Spinal stenosis, lumbar region without neurogenic claudication: Secondary | ICD-10-CM | POA: Insufficient documentation

## 2015-03-06 DIAGNOSIS — M533 Sacrococcygeal disorders, not elsewhere classified: Secondary | ICD-10-CM | POA: Diagnosis not present

## 2015-03-06 DIAGNOSIS — K219 Gastro-esophageal reflux disease without esophagitis: Secondary | ICD-10-CM | POA: Insufficient documentation

## 2015-03-06 DIAGNOSIS — M545 Low back pain: Secondary | ICD-10-CM | POA: Diagnosis present

## 2015-03-06 DIAGNOSIS — E119 Type 2 diabetes mellitus without complications: Secondary | ICD-10-CM | POA: Diagnosis not present

## 2015-03-06 DIAGNOSIS — M5136 Other intervertebral disc degeneration, lumbar region: Secondary | ICD-10-CM | POA: Diagnosis not present

## 2015-03-06 DIAGNOSIS — I10 Essential (primary) hypertension: Secondary | ICD-10-CM | POA: Insufficient documentation

## 2015-03-06 DIAGNOSIS — M79606 Pain in leg, unspecified: Secondary | ICD-10-CM | POA: Diagnosis present

## 2015-03-06 DIAGNOSIS — M47816 Spondylosis without myelopathy or radiculopathy, lumbar region: Secondary | ICD-10-CM | POA: Insufficient documentation

## 2015-03-06 DIAGNOSIS — M51369 Other intervertebral disc degeneration, lumbar region without mention of lumbar back pain or lower extremity pain: Secondary | ICD-10-CM

## 2015-03-06 DIAGNOSIS — G8929 Other chronic pain: Secondary | ICD-10-CM | POA: Insufficient documentation

## 2015-03-06 DIAGNOSIS — M5416 Radiculopathy, lumbar region: Secondary | ICD-10-CM

## 2015-03-06 NOTE — Progress Notes (Signed)
Subjective:    Patient ID: Jose Carroll, male    DOB: 1965-07-20, 50 y.o.   MRN: [id number]  HPI  The patient is a 50 year old gentleman who comes to pain management Center at the request of [name] for further evaluation and treatment of pain involving the lumbar and lower extremity region. The patient is status post surgical intervention of the lumbar region 4 and has been informed by Dr. Sherley Bounds that they're is no and for surgical intervention at time of patient's previous visits to Dr. Sherley Bounds. The patient states that his pain radiation the lumbar region towards the lower extremities on the left as well as on the right. The patient stated that there is a burning stinging quality of pain with pain deep disabling shooting and tender tingling uncouple sensation that awakens patient from sleep associated with spasms and numbness. The patient states the pain increased with bending and of course kneeling lifting sitting standing squatting stooping twisting walking working. The patient states that the pain is aggravated by prolonged standing as well. We discussed patient's condition and will consider patient for interventional treatment consisting of lumbar facet, medial branch nerve blocks at time return appointment in attempt to decrease severity of symptoms, minimize progression of symptoms, and avoid the need for more involved treatment. The patient agreed to suggested treatment plan. We patient undergoing follow-up evaluation with Dr. Sherley Bounds as well and we will remain available to consider patient for additional modifications of treatment pending response to treatment and follow-up evaluation. The patient agreed to suggested treatment plan           Review of Systems    Cardiovascular: High blood pressure   Pulmonary: Cigarette smoking  Neurological: Unremarkable  Psychological: Unremarkable  Gastrointestinal: Gastroesophageal reflux  disease  Genitourinary: Unremarkable  Hematologic: Unremarkable  Endocrine: Diabetes mellitus  Rheumatological: Unremarkable  Musculoskeletal: Unremarkable  Other significant: Unremarkable      Objective:   Physical Exam  There was tenderness to palpation of paraspinal misreading the cervical region cervical facet region palpation of the region of the splenius capitis and occipitalis musculature regions reproduced pain of moderate degree. There was moderate tenderness of the cervical facet cervical paraspinal musculature region. The patient appeared to be with no significant tenderness to palpation of the acromioclavicular and glenohumeral joint regions there appeared to be unremarkable Spurling's maneuver. Tinel and Phalen's maneuver were without increased pain of significant degree and patient appeared to be with bilaterally equal grip strength. Palpation over the thoracic facet thoracic paraspinal musculature region was with no crepitus of the region. Palpation over the lower thoracic region was evidence of muscle spasm of moderate degree. Palpation over the lumbar paraspinal muscles lumbar facet region was attends to palpation of moderate severe degree. Lateral bending rotation extension and palpation of lumbar facets reproduce severe pain. Straight leg raise was limited to 20. EHL strength appeared to be decreased. There was question decreased sensation L5 dermatomal distribution. Outpatient of the PSIS and PII S region reproduces moderate discomfort with mild tenderness of the greater trochanteric region and iliotibial band region. There was negative clonus negative Homans. Abdomen was nontender with no costovertebral tenderness noted. Predominant portion of patient's pain was reproduced with palpation of the lumbar facet lumbar paraspinal musculature region.      Assessment & Plan:    Degenerative disc disease lumbar spine  Status post lumbar surgery 4  Lumbar facet  syndrome  Lumbar stenosis with neurogenic claudication  Sacroiliac joint dysfunction  PLAN  Continue present medication for now. We will consider resuming Lyrica  Lumbar facet, medial branch nerve, blocks to be performed at time of return appointment as discussed  F/U PCP C Holland  for evaliation of  BP and general medical  Condition  F/U Dr.S Humphrey Rolls as discussed   F/U surgical evaluation. Ask nurses and secretary the date of your reevaluation with Dr. Sherley Bounds for neurosurgical reevaluation  F/U neurological evaluation. May consider pending follow-up evaluations  May consider radiofrequency rhizolysis or intraspinal procedures pending response to present treatment and F/U evaluation   Patient to call Pain Management Center should patient have concerns prior to scheduled return appointment.

## 2015-03-06 NOTE — Patient Instructions (Addendum)
Continue present medication for now. We will consider resuming Lyrica  Lumbar facet, medial branch nerve, blocks to be performed at time of return appointment as discussed  F/U PCP C Holland  for evaliation of  BP and general medical  Condition  F/U Dr.S Humphrey Rolls as discussed   F/U surgical evaluation. Ask nurses and secretary the date of your reevaluation with Dr. Sherley Bounds for neurosurgical reevaluation  F/U neurological evaluation. May consider pending follow-up evaluations  May consider radiofrequency rhizolysis or intraspinal procedures pending response to present treatment and F/U evaluation   Patient to call [facility] should patient have concerns prior to scheduled return appointment. Pain Management Discharge Instructions  General Discharge Instructions :  If you need to reach your doctor call: Monday-Friday 8:00 am - 4:00 pm at [phone] or toll free [phone].  After clinic hours [phone] to have operator reach doctor.  Bring all of your medication bottles to all your appointments in the pain clinic.  To cancel or reschedule your appointment with Pain Management please remember to call 24 hours in advance to avoid a fee.  Refer to the educational materials which you have been given on: General Risks, I had my Procedure. Discharge Instructions, Post Sedation.  Post Procedure Instructions:  The drugs you were given will stay in your system until tomorrow, so for the next 24 hours you should not drive, make any legal decisions or drink any alcoholic beverages.  You may eat anything you prefer, but it is better to start with liquids then soups and crackers, and gradually work up to solid foods.  Please notify your doctor immediately if you have any unusual bleeding, trouble breathing or pain that is not related to your normal pain.  Depending on the type of procedure that was done, some parts of your body may feel week and/or numb.  This usually clears  up by tonight or the next day.  Walk with the use of an assistive device or accompanied by an adult for the 24 hours.  You may use ice on the affected area for the first 24 hours.  Put ice in a Ziploc bag and cover with a towel and place against area 15 minutes on 15 minutes off.  You may switch to heat after 24 hours.GENERAL RISKS AND COMPLICATIONS  What are the risk, side effects and possible complications? Generally speaking, most procedures are safe.  However, with any procedure there are risks, side effects, and the possibility of complications.  The risks and complications are dependent upon the sites that are lesioned, or the type of nerve block to be performed.  The closer the procedure is to the spine, the more serious the risks are.  Great care is taken when placing the radio frequency needles, block needles or lesioning probes, but sometimes complications can occur. 1. Infection: Any time there is an injection through the skin, there is a risk of infection.  This is why sterile conditions are used for these blocks.  There are four possible types of infection. 1. Localized skin infection. 2. Central Nervous System Infection-This can be in the form of Meningitis, which can be deadly. 3. Epidural Infections-This can be in the form of an epidural abscess, which can cause pressure inside of the spine, causing compression of the spinal cord with subsequent paralysis. This would require an emergency surgery to decompress, and there are no guarantees that the patient would recover from the paralysis. 4. Discitis-This is an infection of the intervertebral discs.  It occurs in about 1% of discography procedures.  It is difficult to treat and it may lead to surgery.        2. Pain: the needles have to go through skin and soft tissues, will cause soreness.       3. Damage to internal structures:  The nerves to be lesioned may be near blood vessels or    other nerves which can be potentially damaged.        4. Bleeding: Bleeding is more common if the patient is taking blood thinners such as  aspirin, Coumadin, Ticiid, Plavix, etc., or if he/she have some genetic predisposition  such as hemophilia. Bleeding into the spinal canal can cause compression of the spinal  cord with subsequent paralysis.  This would require an emergency surgery to  decompress and there are no guarantees that the patient would recover from the  paralysis.       5. Pneumothorax:  Puncturing of a lung is a possibility, every time a needle is introduced in  the area of the chest or upper back.  Pneumothorax refers to free air around the  collapsed lung(s), inside of the thoracic cavity (chest cavity).  Another two possible  complications related to a similar event would include: Hemothorax and Chylothorax.   These are variations of the Pneumothorax, where instead of air around the collapsed  lung(s), you may have blood or chyle, respectively.       6. Spinal headaches: They may occur with any procedures in the area of the spine.       7. Persistent CSF (Cerebro-Spinal Fluid) leakage: This is a rare problem, but may occur  with prolonged intrathecal or epidural catheters either due to the formation of a fistulous  track or a dural tear.       8. Nerve damage: By working so close to the spinal cord, there is always a possibility of  nerve damage, which could be as serious as a permanent spinal cord injury with  paralysis.       9. Death:  Although rare, severe deadly allergic reactions known as "Anaphylactic  reaction" can occur to any of the medications used.      10. Worsening of the symptoms:  We can always make thing worse.  What are the chances of something like this happening? Chances of any of this occuring are extremely low.  By statistics, you have more of a chance of getting killed in a motor vehicle accident: while driving to the hospital than any of the above occurring .  Nevertheless, you should be aware that they are  possibilities.  In general, it is similar to taking a shower.  Everybody knows that you can slip, hit your head and get killed.  Does that mean that you should not shower again?  Nevertheless always keep in mind that statistics do not mean anything if you happen to be on the wrong side of them.  Even if a procedure has a 1 (one) in a 1,000,000 (million) chance of going wrong, it you happen to be that one..Also, keep in mind that by statistics, you have more of a chance of having something go wrong when taking medications.  Who should not have this procedure? If you are on a blood thinning medication (e.g. Coumadin, Plavix, see list of "Blood Thinners"), or if you have an active infection going on, you should not have the procedure.  If you are taking any blood thinners, please inform your physician.  How should I prepare for this procedure?  Do not eat or drink anything at least six hours prior to the procedure.  Bring a driver with you .  It cannot be a taxi.  Come accompanied by an adult that can drive you back, and that is strong enough to help you if your legs get weak or numb from the local anesthetic.  Take all of your medicines the morning of the procedure with just enough water to swallow them.  If you have diabetes, make sure that you are scheduled to have your procedure done first thing in the morning, whenever possible.  If you have diabetes, take only half of your insulin dose and notify our nurse that you have done so as soon as you arrive at the clinic.  If you are diabetic, but only take blood sugar pills (oral hypoglycemic), then do not take them on the morning of your procedure.  You may take them after you have had the procedure.  Do not take aspirin or any aspirin-containing medications, at least eleven (11) days prior to the procedure.  They may prolong bleeding.  Wear loose fitting clothing that may be easy to take off and that you would not mind if it got stained with  Betadine or blood.  Do not wear any jewelry or perfume  Remove any nail coloring.  It will interfere with some of our monitoring equipment.  NOTE: Remember that this is not meant to be interpreted as a complete list of all possible complications.  Unforeseen problems may occur.  BLOOD THINNERS The following drugs contain aspirin or other products, which can cause increased bleeding during surgery and should not be taken for 2 weeks prior to and 1 week after surgery.  If you should need take something for relief of minor pain, you may take acetaminophen which is found in Tylenol,m Datril, Anacin-3 and Panadol. It is not blood thinner. The products listed below are.  Do not take any of the products listed below in addition to any listed on your instruction sheet.  A.P.C or A.P.C with Codeine Codeine Phosphate Capsules #3 Ibuprofen Ridaura  ABC compound Congesprin Imuran rimadil  Advil Cope Indocin Robaxisal  Alka-Seltzer Effervescent Pain Reliever and Antacid Coricidin or Coricidin-D  Indomethacin Rufen  Alka-Seltzer plus Cold Medicine Cosprin Ketoprofen S-A-C Tablets  Anacin Analgesic Tablets or Capsules Coumadin Korlgesic Salflex  Anacin Extra Strength Analgesic tablets or capsules CP-2 Tablets Lanoril Salicylate  Anaprox Cuprimine Capsules Levenox Salocol  Anexsia-D Dalteparin Magan Salsalate  Anodynos Darvon compound Magnesium Salicylate Sine-off  Ansaid Dasin Capsules Magsal Sodium Salicylate  Anturane Depen Capsules Marnal Soma  APF Arthritis pain formula Dewitt's Pills Measurin Stanback  Argesic Dia-Gesic Meclofenamic Sulfinpyrazone  Arthritis Bayer Timed Release Aspirin Diclofenac Meclomen Sulindac  Arthritis pain formula Anacin Dicumarol Medipren Supac  Analgesic (Safety coated) Arthralgen Diffunasal Mefanamic Suprofen  Arthritis Strength Bufferin Dihydrocodeine Mepro Compound Suprol  Arthropan liquid Dopirydamole Methcarbomol with Aspirin Synalgos  ASA tablets/Enseals Disalcid  Micrainin Tagament  Ascriptin Doan's Midol Talwin  Ascriptin A/D Dolene Mobidin Tanderil  Ascriptin Extra Strength Dolobid Moblgesic Ticlid  Ascriptin with Codeine Doloprin or Doloprin with Codeine Momentum Tolectin  Asperbuf Duoprin Mono-gesic Trendar  Aspergum Duradyne Motrin or Motrin IB Triminicin  Aspirin plain, buffered or enteric coated Durasal Myochrisine Trigesic  Aspirin Suppositories Easprin Nalfon Trillsate  Aspirin with Codeine Ecotrin Regular or Extra Strength Naprosyn Uracel  Atromid-S Efficin Naproxen Ursinus  Auranofin Capsules Elmiron Neocylate Vanquish  Axotal Emagrin Norgesic Verin  Azathioprine  Empirin or Empirin with Codeine Normiflo Vitamin E  Azolid Emprazil Nuprin Voltaren  Bayer Aspirin plain, buffered or children's or timed BC Tablets or powders Encaprin Orgaran Warfarin Sodium  Buff-a-Comp Enoxaparin Orudis Zorpin  Buff-a-Comp with Codeine Equegesic Os-Cal-Gesic   Buffaprin Excedrin plain, buffered or Extra Strength Oxalid   Bufferin Arthritis Strength Feldene Oxphenbutazone   Bufferin plain or Extra Strength Feldene Capsules Oxycodone with Aspirin   Bufferin with Codeine Fenoprofen Fenoprofen Pabalate or Pabalate-SF   Buffets II Flogesic Panagesic   Buffinol plain or Extra Strength Florinal or Florinal with Codeine Panwarfarin   Buf-Tabs Flurbiprofen Penicillamine   Butalbital Compound Four-way cold tablets Penicillin   Butazolidin Fragmin Pepto-Bismol   Carbenicillin Geminisyn Percodan   Carna Arthritis Reliever Geopen Persantine   Carprofen Gold's salt Persistin   Chloramphenicol Goody's Phenylbutazone   Chloromycetin Haltrain Piroxlcam   Clmetidine heparin Plaquenil   Cllnoril Hyco-pap Ponstel   Clofibrate Hydroxy chloroquine Propoxyphen         Before stopping any of these medications, be sure to consult the physician who ordered them.  Some, such as Coumadin (Warfarin) are ordered to prevent or treat serious conditions such as "deep thrombosis",  "pumonary embolisms", and other heart problems.  The amount of time that you may need off of the medication may also vary with the medication and the reason for which you were taking it.  If you are taking any of these medications, please make sure you notify your pain physician before you undergo any procedures.         Facet Joint Block The facet joints connect the bones of the spine (vertebrae). They make it possible for you to bend, twist, and make other movements with your spine. They also prevent you from overbending, overtwisting, and making other excessive movements.  A facet joint block is a procedure where a numbing medicine (anesthetic) is injected into a facet joint. Often, a type of anti-inflammatory medicine called a steroid is also injected. A facet joint block may be done for two reasons:  2. Diagnosis. A facet joint block may be done as a test to see whether neck or back pain is caused by a worn-down or infected facet joint. If the pain gets better after a facet joint block, it means the pain is probably coming from the facet joint. If the pain does not get better, it means the pain is probably not coming from the facet joint.  3. Therapy. A facet joint block may be done to relieve neck or back pain caused by a facet joint. A facet joint block is only done as a therapy if the pain does not improve with medicine, exercise programs, physical therapy, and other forms of pain management. LET [facility] CARE PROVIDER KNOW ABOUT:   Any allergies you have.   All medicines you are taking, including vitamins, herbs, eyedrops, and over-the-counter medicines and creams.   Previous problems you or members of your family have had with the use of anesthetics.   Any blood disorders you have had.   Other health problems you have. RISKS AND COMPLICATIONS Generally, having a facet joint block is safe. However, as with any procedure, complications can occur. Possible complications  associated with having a facet joint block include:   Bleeding.   Injury to a nerve near the injection site.   Pain at the injection site.   Weakness or numbness in areas controlled by nerves near the injection site.   Infection.   Temporary fluid retention.  Allergic reaction to anesthetics or medicines used during the procedure. BEFORE THE PROCEDURE   Follow your health care provider's instructions if you are taking dietary supplements or medicines. You may need to stop taking them or reduce your dosage.   Do not take any new dietary supplements or medicines without asking your health care provider first.   Follow your health care provider's instructions about eating and drinking before the procedure. You may need to stop eating and drinking several hours before the procedure.   Arrange to have an adult drive you home after the procedure. PROCEDURE 12. You may need to remove your clothing and dress in an open-back gown so that your health care provider can access your spine.  13. The procedure will be done while you are lying on an X-ray table. Most of the time you will be asked to lie on your stomach, but you may be asked to lie in a different position if an injection will be made in your neck.  14. Special machines will be used to monitor your oxygen levels, heart rate, and blood pressure.  15. If an injection will be made in your neck, an intravenous (IV) tube will be inserted into one of your veins. Fluids and medicine will flow directly into your body through the IV tube.  16. The area over the facet joint where the injection will be made will be cleaned with an antiseptic soap. The surrounding skin will be covered with sterile drapes.  17. An anesthetic will be applied to your skin to make the injection area numb. You may feel a temporary stinging or burning sensation.  18. A video X-ray machine will be used to locate the joint. A contrast dye may be injected  into the facet joint area to help with locating the joint.  19. When the joint is located, an anesthetic medicine will be injected into the joint through the needle.  25. Your health care provider will ask you whether you feel pain relief. If you do feel relief, a steroid may be injected to provide pain relief for a longer period of time. If you do not feel relief or feel only partial relief, additional injections of an anesthetic may be made in other facet joints.  21. The needle will be removed, the skin will be cleansed, and bandages will be applied.  AFTER THE PROCEDURE   You will be observed for 15-30 minutes before being allowed to go home. Do not drive. Have an adult drive you or take a taxi or public transportation instead.   If you feel pain relief, the pain will return in several hours or days when the anesthetic wears off.   You may feel pain relief 2-14 days after the procedure. The amount of time this relief lasts varies from person to person.   It is normal to feel some tenderness over the injected area(s) for 2 days following the procedure.   If you have diabetes, you may have a temporary increase in blood sugar.   This information is not intended to replace advice given to you by your health care provider. Make sure you discuss any questions you have with your health care provider.   Document Released: 05/19/2006 Document Revised: 01/18/2014 Document Reviewed: 10/18/2011 Elsevier Interactive Patient Education [employer].

## 2015-03-06 NOTE — Progress Notes (Signed)
Safety precautions to be maintained throughout the outpatient stay will include: orient to surroundings, keep bed in low position, maintain call bell within reach at all times, provide assistance with transfer out of bed and ambulation.  

## 2015-03-17 ENCOUNTER — Ambulatory Visit: Payer: Medicare Other | Admitting: Pain Medicine

## 2015-03-25 ENCOUNTER — Other Ambulatory Visit: Payer: Self-pay | Admitting: Pain Medicine

## 2015-04-01 ENCOUNTER — Other Ambulatory Visit: Payer: Self-pay | Admitting: Pain Medicine

## 2015-04-02 ENCOUNTER — Encounter: Payer: Self-pay | Admitting: Pain Medicine

## 2015-04-02 ENCOUNTER — Ambulatory Visit: Payer: Medicare Other | Attending: Pain Medicine | Admitting: Pain Medicine

## 2015-04-02 VITALS — BP 141/90 | HR 72 | Temp 97.4°F | Resp 14 | Ht 70.0 in | Wt 206.0 lb

## 2015-04-02 DIAGNOSIS — M5136 Other intervertebral disc degeneration, lumbar region: Secondary | ICD-10-CM | POA: Diagnosis not present

## 2015-04-02 DIAGNOSIS — M51369 Other intervertebral disc degeneration, lumbar region without mention of lumbar back pain or lower extremity pain: Secondary | ICD-10-CM

## 2015-04-02 DIAGNOSIS — Z9889 Other specified postprocedural states: Secondary | ICD-10-CM | POA: Diagnosis not present

## 2015-04-02 DIAGNOSIS — M533 Sacrococcygeal disorders, not elsewhere classified: Secondary | ICD-10-CM

## 2015-04-02 DIAGNOSIS — M545 Low back pain: Secondary | ICD-10-CM | POA: Diagnosis present

## 2015-04-02 DIAGNOSIS — M4806 Spinal stenosis, lumbar region: Secondary | ICD-10-CM | POA: Insufficient documentation

## 2015-04-02 DIAGNOSIS — M79606 Pain in leg, unspecified: Secondary | ICD-10-CM | POA: Diagnosis present

## 2015-04-02 DIAGNOSIS — M5416 Radiculopathy, lumbar region: Secondary | ICD-10-CM

## 2015-04-02 DIAGNOSIS — M48061 Spinal stenosis, lumbar region without neurogenic claudication: Secondary | ICD-10-CM

## 2015-04-02 DIAGNOSIS — M47816 Spondylosis without myelopathy or radiculopathy, lumbar region: Secondary | ICD-10-CM

## 2015-04-02 MED ORDER — CIPROFLOXACIN IN D5W 400 MG/200ML IV SOLN
INTRAVENOUS | Status: AC
Start: 2015-04-02 — End: 2015-04-02
  Administered 2015-04-02: 400 mg via INTRAVENOUS
  Filled 2015-04-02: qty 200

## 2015-04-02 MED ORDER — TRIAMCINOLONE ACETONIDE 40 MG/ML IJ SUSP: 40.0000 mg | Freq: Once | INTRAMUSCULAR | Status: DC

## 2015-04-02 MED ORDER — FENTANYL CITRATE (PF) 100 MCG/2ML IJ SOLN
INTRAMUSCULAR | Status: AC
  Filled 2015-04-02: qty 2

## 2015-04-02 MED ORDER — LACTATED RINGERS IV SOLN: 1000.0000 mL | INTRAVENOUS | Status: DC

## 2015-04-02 MED ORDER — MIDAZOLAM HCL 5 MG/5ML IJ SOLN
INTRAMUSCULAR | Status: AC
  Filled 2015-04-02: qty 5

## 2015-04-02 MED ORDER — ORPHENADRINE CITRATE 30 MG/ML IJ SOLN
60.0000 mg | Freq: Once | INTRAMUSCULAR | Status: DC
Start: 2015-04-02 — End: 2015-04-30

## 2015-04-02 MED ORDER — BUPIVACAINE HCL (PF) 0.25 % IJ SOLN
INTRAMUSCULAR | Status: AC
  Filled 2015-04-02: qty 30

## 2015-04-02 MED ORDER — TRIAMCINOLONE ACETONIDE 40 MG/ML IJ SUSP
INTRAMUSCULAR | Status: AC
  Filled 2015-04-02: qty 1

## 2015-04-02 MED ORDER — MIDAZOLAM HCL 5 MG/5ML IJ SOLN: 5.0000 mg | Freq: Once | INTRAMUSCULAR | Status: DC

## 2015-04-02 MED ORDER — CIPROFLOXACIN HCL 250 MG PO TABS: 250.0000 mg | ORAL_TABLET | Freq: Two times a day (BID) | ORAL | Status: DC

## 2015-04-02 MED ORDER — FENTANYL CITRATE (PF) 100 MCG/2ML IJ SOLN: 100.0000 ug | Freq: Once | INTRAMUSCULAR | Status: DC

## 2015-04-02 MED ORDER — ORPHENADRINE CITRATE 30 MG/ML IJ SOLN
INTRAMUSCULAR | Status: AC
  Filled 2015-04-02: qty 2

## 2015-04-02 MED ORDER — BUPIVACAINE HCL (PF) 0.25 % IJ SOLN: 30.0000 mL | Freq: Once | INTRAMUSCULAR | Status: DC

## 2015-04-02 MED ORDER — CIPROFLOXACIN IN D5W 400 MG/200ML IV SOLN
400.0000 mg | Freq: Once | INTRAVENOUS | Status: AC
  Administered 2015-04-02: 400 mg via INTRAVENOUS

## 2015-04-02 NOTE — Patient Instructions (Addendum)
PLAN   Continue present medication for now.  F/U PCP C Holland  for evaliation of  BP cardiac condition and general medical  condition as we discussed today  F/U Dr.S Humphrey Rolls as discussed for cardiac evaluation as planned   F/U surgical evaluation. Ask nurses and secretary the date of your reevaluation with Dr. Sherley Bounds for neurosurgical reevaluation as we previously discussed  F/U neurological evaluation. May consider pending follow-up evaluations  May consider radiofrequency rhizolysis or intraspinal procedures pending response to present treatment and F/U evaluation   Patient to call [facility] should patient have concerns prior to scheduled return appointment.GENERAL RISKS AND COMPLICATIONS  What are the risk, side effects and possible complications? Generally speaking, most procedures are safe.  However, with any procedure there are risks, side effects, and the possibility of complications.  The risks and complications are dependent upon the sites that are lesioned, or the type of nerve block to be performed.  The closer the procedure is to the spine, the more serious the risks are.  Great care is taken when placing the radio frequency needles, block needles or lesioning probes, but sometimes complications can occur. 1. Infection: Any time there is an injection through the skin, there is a risk of infection.  This is why sterile conditions are used for these blocks.  There are four possible types of infection. 1. Localized skin infection. 2. Central Nervous System Infection-This can be in the form of Meningitis, which can be deadly. 3. Epidural Infections-This can be in the form of an epidural abscess, which can cause pressure inside of the spine, causing compression of the spinal cord with subsequent paralysis. This would require an emergency surgery to decompress, and there are no guarantees that the patient would recover from the paralysis. 4. Discitis-This is an infection  of the intervertebral discs.  It occurs in about 1% of discography procedures.  It is difficult to treat and it may lead to surgery.        2. Pain: the needles have to go through skin and soft tissues, will cause soreness.       3. Damage to internal structures:  The nerves to be lesioned may be near blood vessels or    other nerves which can be potentially damaged.       4. Bleeding: Bleeding is more common if the patient is taking blood thinners such as  aspirin, Coumadin, Ticiid, Plavix, etc., or if he/she have some genetic predisposition  such as hemophilia. Bleeding into the spinal canal can cause compression of the spinal  cord with subsequent paralysis.  This would require an emergency surgery to  decompress and there are no guarantees that the patient would recover from the  paralysis.       5. Pneumothorax:  Puncturing of a lung is a possibility, every time a needle is introduced in  the area of the chest or upper back.  Pneumothorax refers to free air around the  collapsed lung(s), inside of the thoracic cavity (chest cavity).  Another two possible  complications related to a similar event would include: Hemothorax and Chylothorax.   These are variations of the Pneumothorax, where instead of air around the collapsed  lung(s), you may have blood or chyle, respectively.       6. Spinal headaches: They may occur with any procedures in the area of the spine.       7. Persistent CSF (Cerebro-Spinal Fluid) leakage: This is a rare problem, but may  occur  with prolonged intrathecal or epidural catheters either due to the formation of a fistulous  track or a dural tear.       8. Nerve damage: By working so close to the spinal cord, there is always a possibility of  nerve damage, which could be as serious as a permanent spinal cord injury with  paralysis.       9. Death:  Although rare, severe deadly allergic reactions known as "Anaphylactic  reaction" can occur to any of the medications used.       10. Worsening of the symptoms:  We can always make thing worse.  What are the chances of something like this happening? Chances of any of this occuring are extremely low.  By statistics, you have more of a chance of getting killed in a motor vehicle accident: while driving to the hospital than any of the above occurring .  Nevertheless, you should be aware that they are possibilities.  In general, it is similar to taking a shower.  Everybody knows that you can slip, hit your head and get killed.  Does that mean that you should not shower again?  Nevertheless always keep in mind that statistics do not mean anything if you happen to be on the wrong side of them.  Even if a procedure has a 1 (one) in a 1,000,000 (million) chance of going wrong, it you happen to be that one..Also, keep in mind that by statistics, you have more of a chance of having something go wrong when taking medications.  Who should not have this procedure? If you are on a blood thinning medication (e.g. Coumadin, Plavix, see list of "Blood Thinners"), or if you have an active infection going on, you should not have the procedure.  If you are taking any blood thinners, please inform your physician.  How should I prepare for this procedure?  Do not eat or drink anything at least six hours prior to the procedure.  Bring a driver with you .  It cannot be a taxi.  Come accompanied by an adult that can drive you back, and that is strong enough to help you if your legs get weak or numb from the local anesthetic.  Take all of your medicines the morning of the procedure with just enough water to swallow them.  If you have diabetes, make sure that you are scheduled to have your procedure done first thing in the morning, whenever possible.  If you have diabetes, take only half of your insulin dose and notify our nurse that you have done so as soon as you arrive at the clinic.  If you are diabetic, but only take blood sugar pills (oral  hypoglycemic), then do not take them on the morning of your procedure.  You may take them after you have had the procedure.  Do not take aspirin or any aspirin-containing medications, at least eleven (11) days prior to the procedure.  They may prolong bleeding.  Wear loose fitting clothing that may be easy to take off and that you would not mind if it got stained with Betadine or blood.  Do not wear any jewelry or perfume  Remove any nail coloring.  It will interfere with some of our monitoring equipment.  NOTE: Remember that this is not meant to be interpreted as a complete list of all possible complications.  Unforeseen problems may occur.  BLOOD THINNERS The following drugs contain aspirin or other products, which can cause increased bleeding  during surgery and should not be taken for 2 weeks prior to and 1 week after surgery.  If you should need take something for relief of minor pain, you may take acetaminophen which is found in Tylenol,m Datril, Anacin-3 and Panadol. It is not blood thinner. The products listed below are.  Do not take any of the products listed below in addition to any listed on your instruction sheet.  A.P.C or A.P.C with Codeine Codeine Phosphate Capsules #3 Ibuprofen Ridaura  ABC compound Congesprin Imuran rimadil  Advil Cope Indocin Robaxisal  Alka-Seltzer Effervescent Pain Reliever and Antacid Coricidin or Coricidin-D  Indomethacin Rufen  Alka-Seltzer plus Cold Medicine Cosprin Ketoprofen S-A-C Tablets  Anacin Analgesic Tablets or Capsules Coumadin Korlgesic Salflex  Anacin Extra Strength Analgesic tablets or capsules CP-2 Tablets Lanoril Salicylate  Anaprox Cuprimine Capsules Levenox Salocol  Anexsia-D Dalteparin Magan Salsalate  Anodynos Darvon compound Magnesium Salicylate Sine-off  Ansaid Dasin Capsules Magsal Sodium Salicylate  Anturane Depen Capsules Marnal Soma  APF Arthritis pain formula Dewitt's Pills Measurin Stanback  Argesic Dia-Gesic Meclofenamic  Sulfinpyrazone  Arthritis Bayer Timed Release Aspirin Diclofenac Meclomen Sulindac  Arthritis pain formula Anacin Dicumarol Medipren Supac  Analgesic (Safety coated) Arthralgen Diffunasal Mefanamic Suprofen  Arthritis Strength Bufferin Dihydrocodeine Mepro Compound Suprol  Arthropan liquid Dopirydamole Methcarbomol with Aspirin Synalgos  ASA tablets/Enseals Disalcid Micrainin Tagament  Ascriptin Doan's Midol Talwin  Ascriptin A/D Dolene Mobidin Tanderil  Ascriptin Extra Strength Dolobid Moblgesic Ticlid  Ascriptin with Codeine Doloprin or Doloprin with Codeine Momentum Tolectin  Asperbuf Duoprin Mono-gesic Trendar  Aspergum Duradyne Motrin or Motrin IB Triminicin  Aspirin plain, buffered or enteric coated Durasal Myochrisine Trigesic  Aspirin Suppositories Easprin Nalfon Trillsate  Aspirin with Codeine Ecotrin Regular or Extra Strength Naprosyn Uracel  Atromid-S Efficin Naproxen Ursinus  Auranofin Capsules Elmiron Neocylate Vanquish  Axotal Emagrin Norgesic Verin  Azathioprine Empirin or Empirin with Codeine Normiflo Vitamin E  Azolid Emprazil Nuprin Voltaren  Bayer Aspirin plain, buffered or children's or timed BC Tablets or powders Encaprin Orgaran Warfarin Sodium  Buff-a-Comp Enoxaparin Orudis Zorpin  Buff-a-Comp with Codeine Equegesic Os-Cal-Gesic   Buffaprin Excedrin plain, buffered or Extra Strength Oxalid   Bufferin Arthritis Strength Feldene Oxphenbutazone   Bufferin plain or Extra Strength Feldene Capsules Oxycodone with Aspirin   Bufferin with Codeine Fenoprofen Fenoprofen Pabalate or Pabalate-SF   Buffets II Flogesic Panagesic   Buffinol plain or Extra Strength Florinal or Florinal with Codeine Panwarfarin   Buf-Tabs Flurbiprofen Penicillamine   Butalbital Compound Four-way cold tablets Penicillin   Butazolidin Fragmin Pepto-Bismol   Carbenicillin Geminisyn Percodan   Carna Arthritis Reliever Geopen Persantine   Carprofen Gold's salt Persistin   Chloramphenicol Goody's  Phenylbutazone   Chloromycetin Haltrain Piroxlcam   Clmetidine heparin Plaquenil   Cllnoril Hyco-pap Ponstel   Clofibrate Hydroxy chloroquine Propoxyphen         Before stopping any of these medications, be sure to consult the physician who ordered them.  Some, such as Coumadin (Warfarin) are ordered to prevent or treat serious conditions such as "deep thrombosis", "pumonary embolisms", and other heart problems.  The amount of time that you may need off of the medication may also vary with the medication and the reason for which you were taking it.  If you are taking any of these medications, please make sure you notify your pain physician before you undergo any procedures.         Facet Blocks Patient Information  Description: The facets are joints in the spine between the vertebrae.  Like any joints in the body, facets can become irritated and painful.  Arthritis can also effect the facets.  By injecting steroids and local anesthetic in and around these joints, we can temporarily block the nerve supply to them.  Steroids act directly on irritated nerves and tissues to reduce selling and inflammation which often leads to decreased pain.  Facet blocks may be done anywhere along the spine from the neck to the low back depending upon the location of your pain.   After numbing the skin with local anesthetic (like Novocaine), a small needle is passed onto the facet joints under x-ray guidance.  You may experience a sensation of pressure while this is being done.  The entire block usually lasts about 15-25 minutes.   Conditions which may be treated by facet blocks:   Low back/buttock pain  Neck/shoulder pain  Certain types of headaches  Preparation for the injection:  1. Do not eat any solid food or dairy products within 8 hours of your appointment. 2. You may drink clear liquid up to 3 hours before appointment.  Clear liquids include water, black coffee, juice or soda.  No milk or cream  please. 3. You may take your regular medication, including pain medications, with a sip of water before your appointment.  Diabetics should hold regular insulin (if taken separately) and take 1/2 normal NPH dose the morning of the procedure.  Carry some sugar containing items with you to your appointment. 4. A driver must accompany you and be prepared to drive you home after your procedure. 5. Bring all your current medications with you. 6. An IV may be inserted and sedation may be given at the discretion of the physician. 7. A blood pressure cuff, EKG and other monitors will often be applied during the procedure.  Some patients may need to have extra oxygen administered for a short period. 8. You will be asked to provide medical information, including your allergies and medications, prior to the procedure.  We must know immediately if you are taking blood thinners (like Coumadin/Warfarin) or if you are allergic to IV iodine contrast (dye).  We must know if you could possible be pregnant.  Possible side-effects:   Bleeding from needle site  Infection (rare, may require surgery)  Nerve injury (rare)  Numbness & tingling (temporary)  Difficulty urinating (rare, temporary)  Spinal headache (a headache worse with upright posture)  Light-headedness (temporary)  Pain at injection site (serveral days)  Decreased blood pressure (rare, temporary)  Weakness in arm/leg (temporary)  Pressure sensation in back/neck (temporary)   Call if you experience:   Fever/chills associated with headache or increased back/neck pain  Headache worsened by an upright position  New onset, weakness or numbness of an extremity below the injection site  Hives or difficulty breathing (go to the emergency room)  Inflammation or drainage at the injection site(s)  Severe back/neck pain greater than usual  New symptoms which are concerning to you  Please note:  Although the local anesthetic injected  can often make your back or neck feel good for several hours after the injection, the pain will likely return. It takes 3-7 days for steroids to work.  You may not notice any pain relief for at least one week.  If effective, we will often do a series of 2-3 injections spaced 3-6 weeks apart to maximally decrease your pain.  After the initial series, you may be a candidate for a more permanent nerve block of the facets.  If you have any questions, please call #[phone]) [facility]

## 2015-04-02 NOTE — Progress Notes (Signed)
Safety precautions to be maintained throughout the outpatient stay will include: orient to surroundings, keep bed in low position, maintain call bell within reach at all times, provide assistance with transfer out of bed and ambulation.  

## 2015-04-02 NOTE — Progress Notes (Signed)
   Subjective:    Patient ID: Jose Carroll, male    DOB: 1965/10/04, 50 y.o.   MRN: [id number]  HPI   The patient is a 50 year old gentleman who returns to pain management for further evaluation and treatment of pain involving the lower back and lower extremity region. The patient's pain is aggravated by standing walking twisting turning maneuvers. The patient states that the pain becomes more intense as the day progresses. The patient is discussed further treatment with his neurosurgeon Dr. Sherley Bounds and has been recommended to return to pain management Center for additional treatment. We discussed patient's condition and decision has been to proceed with lumbar facet, medial branch nerve blocks on today's visit. After further evaluation of patient decision was made to counsel procedure and have patient undergo further cardiac evaluation. The patient stated that he was scheduled to undergo evaluation with Dr.S Humphrey Rolls and we informed patient that we preferred to have patient undergo evaluation with Dr.S Humphrey Rolls and that we will consider interventional treatment once patient's was cleared by Dr.S Humphrey Rolls . All agreed to suggested treatment plan.      Review of Systems     Objective:   Physical Exam  There was tends to palpation of paraspinal muscular region cervical region cervical facet region with tenderness over the region of the thoracic facet thoracic paraspinal musculature region with no crepitus of the thoracic region noted. The patient was with bilaterally equal grip strength and Tinel and Phalen's maneuver were without increase of pain of significant degree. There was moderate tenderness of the acromioclavicular and glenohumeral joint regions. There was unremarkable Spurling's maneuver. Palpation over the lumbar paraspinal must reason lumbar facet region was attends to palpation of moderate degree with lateral bending rotation extension and palpation of the lumbar facets reproducing  moderate discomfort. Palpation over the PSIS and PII S region was associated with moderate discomfort. There was mild tenderness of the greater trochanteric region iliotibial band region. Straight leg raise was tolerates approximately 20 without a definite increase of pain with dorsiflexion noted. No definite sensory deficit or dermatomal distribution was detected. There was negative clonus negative Homans. Abdomen nontender with no costovertebral angle tenderness noted.       Assessment & Plan:     Degenerative disc disease lumbar spine  Status post lumbar surgery 4  Lumbar facet syndrome  Lumbar stenosis with neurogenic claudication  Sacroiliac joint dysfunction    PLAN   Continue present medication for now.  F/U PCP C Holland  for evaliation of  BP cardiac condition and general medical  condition as we discussed today . Lumbar facet, medial branch nerve, blocks were cancelled  today. We we will await further cardiac evaluation of patient prior to proceeding with lumbar facet, medial branch nerve blocks  F/U Dr.S Humphrey Rolls as discussed for cardiac evaluation as planned . Lumbar facet, medial branch nerve, blocks were cancelled today . We will await further cardiac evaluation prior to proceeding with procedure   F/U surgical evaluation. Ask nurses and secretary the date of your reevaluation with Dr. Sherley Bounds for neurosurgical reevaluation as we previously discussed  F/U neurological evaluation. May consider pending follow-up evaluations  May consider radiofrequency rhizolysis or intraspinal procedures pending response to present treatment and F/U evaluation   Patient to call Pain Management Center should patient have concerns prior to scheduled return appointment

## 2015-04-03 ENCOUNTER — Telehealth: Payer: Self-pay | Admitting: *Deleted

## 2015-04-03 NOTE — Telephone Encounter (Signed)
voicmail left with patient to call our office if there are questions or concerns re; procedure on yesterday.

## 2015-04-16 ENCOUNTER — Other Ambulatory Visit (HOSPITAL_COMMUNITY): Payer: Self-pay | Admitting: Neurological Surgery

## 2015-04-16 DIAGNOSIS — M48061 Spinal stenosis, lumbar region without neurogenic claudication: Secondary | ICD-10-CM

## 2015-04-29 ENCOUNTER — Emergency Department: Payer: Medicare Other

## 2015-04-29 ENCOUNTER — Encounter: Payer: Self-pay | Admitting: Emergency Medicine

## 2015-04-29 ENCOUNTER — Observation Stay
Admission: EM | Admit: 2015-04-29 | Discharge: 2015-04-30 | Disposition: A | Discharge status: Home / Self Care | Payer: Medicare Other | Attending: Internal Medicine | Admitting: Internal Medicine

## 2015-04-29 DIAGNOSIS — F17210 Nicotine dependence, cigarettes, uncomplicated: Secondary | ICD-10-CM | POA: Diagnosis not present

## 2015-04-29 DIAGNOSIS — I10 Essential (primary) hypertension: Secondary | ICD-10-CM | POA: Diagnosis not present

## 2015-04-29 DIAGNOSIS — Z886 Allergy status to analgesic agent status: Secondary | ICD-10-CM | POA: Insufficient documentation

## 2015-04-29 DIAGNOSIS — K219 Gastro-esophageal reflux disease without esophagitis: Secondary | ICD-10-CM | POA: Insufficient documentation

## 2015-04-29 DIAGNOSIS — R519 Headache, unspecified: Secondary | ICD-10-CM

## 2015-04-29 DIAGNOSIS — Z9889 Other specified postprocedural states: Secondary | ICD-10-CM | POA: Insufficient documentation

## 2015-04-29 DIAGNOSIS — Z833 Family history of diabetes mellitus: Secondary | ICD-10-CM | POA: Diagnosis not present

## 2015-04-29 DIAGNOSIS — R05 Cough: Secondary | ICD-10-CM | POA: Diagnosis not present

## 2015-04-29 DIAGNOSIS — N179 Acute kidney failure, unspecified: Secondary | ICD-10-CM | POA: Diagnosis not present

## 2015-04-29 DIAGNOSIS — E119 Type 2 diabetes mellitus without complications: Secondary | ICD-10-CM | POA: Insufficient documentation

## 2015-04-29 DIAGNOSIS — I959 Hypotension, unspecified: Secondary | ICD-10-CM | POA: Diagnosis present

## 2015-04-29 DIAGNOSIS — R079 Chest pain, unspecified: Secondary | ICD-10-CM | POA: Diagnosis present

## 2015-04-29 DIAGNOSIS — Z7982 Long term (current) use of aspirin: Secondary | ICD-10-CM | POA: Diagnosis not present

## 2015-04-29 DIAGNOSIS — I209 Angina pectoris, unspecified: Secondary | ICD-10-CM | POA: Diagnosis not present

## 2015-04-29 DIAGNOSIS — Z794 Long term (current) use of insulin: Secondary | ICD-10-CM | POA: Insufficient documentation

## 2015-04-29 DIAGNOSIS — Z880 Allergy status to penicillin: Secondary | ICD-10-CM | POA: Insufficient documentation

## 2015-04-29 DIAGNOSIS — R51 Headache: Principal | ICD-10-CM | POA: Insufficient documentation

## 2015-04-29 DIAGNOSIS — M1990 Unspecified osteoarthritis, unspecified site: Secondary | ICD-10-CM | POA: Diagnosis not present

## 2015-04-29 DIAGNOSIS — Z79899 Other long term (current) drug therapy: Secondary | ICD-10-CM | POA: Insufficient documentation

## 2015-04-29 DIAGNOSIS — J449 Chronic obstructive pulmonary disease, unspecified: Secondary | ICD-10-CM | POA: Diagnosis not present

## 2015-04-29 LAB — TROPONIN I

## 2015-04-29 LAB — BASIC METABOLIC PANEL
Anion gap: 9 (ref 5–15)
BUN: 13 mg/dL (ref 6–20)
CALCIUM: 9.2 mg/dL (ref 8.9–10.3)
CO2: 24 mmol/L (ref 22–32)
CREATININE: 1.91 mg/dL — AB (ref 0.61–1.24)
Chloride: 103 mmol/L (ref 101–111)
GFR calc non Af Amer: 40 mL/min — ABNORMAL LOW (ref >60)
GFR, EST AFRICAN AMERICAN: 47 mL/min — AB (ref >60)
Glucose, Bld: 83 mg/dL (ref 65–99)
Potassium: 4.2 mmol/L (ref 3.5–5.1)
Sodium: 136 mmol/L (ref 135–145)

## 2015-04-29 LAB — CBC
HCT: 39 % — ABNORMAL LOW (ref 40.0–52.0)
Hemoglobin: 13.2 g/dL (ref 13.0–18.0)
MCH: 28.5 pg (ref 26.0–34.0)
MCHC: 33.9 g/dL (ref 32.0–36.0)
MCV: 84 fL (ref 80.0–100.0)
PLATELETS: 156 10*3/uL (ref 150–440)
RBC: 4.64 MIL/uL (ref 4.40–5.90)
RDW: 15.4 % — ABNORMAL HIGH (ref 11.5–14.5)
WBC: 7.1 10*3/uL (ref 3.8–10.6)

## 2015-04-29 LAB — HEPATIC FUNCTION PANEL
ALT: 22 U/L (ref 17–63)
AST: 27 U/L (ref 15–41)
Albumin: 4.1 g/dL (ref 3.5–5.0)
Alkaline Phosphatase: 45 U/L (ref 38–126)
BILIRUBIN DIRECT: 0.1 mg/dL (ref 0.1–0.5)
BILIRUBIN INDIRECT: 0.4 mg/dL (ref 0.3–0.9)
Total Bilirubin: 0.5 mg/dL (ref 0.3–1.2)
Total Protein: 6.8 g/dL (ref 6.5–8.1)

## 2015-04-29 MED ORDER — ALBUTEROL SULFATE (2.5 MG/3ML) 0.083% IN NEBU: 5.0000 mg | INHALATION_SOLUTION | Freq: Once | RESPIRATORY_TRACT | Status: DC

## 2015-04-29 MED ORDER — SODIUM CHLORIDE 0.9 % IV SOLN
Freq: Once | INTRAVENOUS | Status: AC
  Administered 2015-04-29: 21:00:00 via INTRAVENOUS

## 2015-04-29 MED ORDER — SODIUM CHLORIDE 0.9 % IV BOLUS (SEPSIS)
1000.0000 mL | Freq: Once | INTRAVENOUS | Status: AC
  Administered 2015-04-29: 1000 mL via INTRAVENOUS

## 2015-04-29 NOTE — ED Notes (Signed)
Pt from home via EMS, called for headache x 3days that's affecting his balance today. Pt was hypotensive on arrival at 78/44, given fluid and came up to 90/46. Pt A&O

## 2015-04-29 NOTE — ED Notes (Signed)
Patient transported to CT 

## 2015-04-29 NOTE — ED Notes (Signed)
Pt hypotensive 78/53, MD notified, verbal order for another fluid bolus. Pt placed in trendelenburg, pt denies any symptoms at this time. Will continue to monitor

## 2015-04-29 NOTE — ED Provider Notes (Signed)
[facility] Emergency Department Provider Note  Time seen: 8:01 PM  I have reviewed the triage vital signs and the nursing notes.   HISTORY  Chief Complaint Headache and Hypotension    HPI Jose Carroll is a 50 y.o. male with a past medical history of diabetes, COPD, arthritis, hypertension, presents the emergency department with a headache for the past 3 days. According to the patient for the past 3 days he has had a frontal headache moderate in severity. States he's never had a headache lasting more than one day. He states he is also been having intermittent chest pain although he states his chest pain is actually improved from his baseline chest pain. Patient is prescribed imdur as well as sublingual nitroglycerin.Patient states he took a nitroglycerin tablet prior to EMS arrival. EMS states patient hypotensive in the 70s upon arrival however patient admits he just taken a nitroglycerin tablet. Patient states a recent nuclear stress test showing unchanged results. Patient does state shortness of breath but states that is chronic due to COPD, slightly worse over the past few days. Occasional cough denies any sputum production. Denies any fever, nausea, vomiting. Describes his chest pain is mild to moderate, intermittent, large and unchanged from baseline per patient.    Past Medical History  Diagnosis Date  . Diabetes mellitus without complication ([place])   . COPD (chronic obstructive pulmonary disease) ([place])   . Shortness of breath   . Arthritis   . Hypertension     dr Humphrey Rolls   [place]  . GERD (gastroesophageal reflux disease)     Patient Active Problem List   Diagnosis Date Noted  . DDD (degenerative disc disease), lumbar 03/06/2015  . Status post lumbar surgery 03/06/2015  . Lumbar radiculopathy 03/06/2015  . Spinal stenosis of lumbar region 03/06/2015  . Facet syndrome, lumbar 03/06/2015  . Sacroiliac joint dysfunction 03/06/2015  . S/P  lumbar spinal fusion 02/14/2013    Past Surgical History  Procedure Laterality Date  . Back surgery      x3  . Finger surgery      sixth finger removed  . Hernia repair    . Cardiac catheterization N/A 09/10/2014    Procedure: Left Heart Cath and Coronary Angiogram ;  Surgeon: Dionisio David, MD;  Location: [place] [facility];  Service: Cardiovascular;  Laterality: N/A;  . Fetal surgery for congenital hernia  2012    Current Outpatient Rx  Name  Route  Sig  Dispense  Refill  . albuterol (PROVENTIL HFA;VENTOLIN HFA) 108 (90 BASE) MCG/ACT inhaler   Inhalation   Inhale 1 puff into the lungs every 6 (six) hours.          Marland Kitchen amLODipine-benazepril (LOTREL) 10-40 MG per capsule   Oral   Take 1 capsule by mouth daily.         Marland Kitchen aspirin 81 MG tablet   Oral   Take 81 mg by mouth daily.         Marland Kitchen atorvastatin (LIPITOR) 40 MG tablet   Oral   Take 40 mg by mouth daily.         . canagliflozin (INVOKANA) 300 MG TABS tablet   Oral   Take 300 mg by mouth daily before breakfast. Reported on 03/06/2015         . chlorthalidone (HYGROTON) 25 MG tablet   Oral   Take 12.5 mg by mouth daily. Reported on 03/06/2015         . ciprofloxacin (  CIPRO) 250 MG tablet   Oral   Take 1 tablet (250 mg total) by mouth 2 (two) times daily.   14 tablet   0   . clopidogrel (PLAVIX) 75 MG tablet   Oral   Take 75 mg by mouth daily.         . cyclobenzaprine (FLEXERIL) 10 MG tablet   Oral   Take 1 tablet (10 mg total) by mouth 3 (three) times daily as needed for muscle spasms. Patient not taking: Reported on 03/06/2015   30 tablet   0   . dexlansoprazole (DEXILANT) 60 MG capsule   Oral   Take 60 mg by mouth daily.          Marland Kitchen doxazosin (CARDURA XL) 4 MG 24 hr tablet   Oral   Take 4 mg by mouth daily with breakfast.         . DULoxetine (CYMBALTA) 60 MG capsule   Oral   Take 60 mg by mouth daily. Reported on 04/02/2015         . fenofibrate (TRICOR) 145 MG tablet   Oral    Take 145 mg by mouth daily.         Marland Kitchen gabapentin (NEURONTIN) 600 MG tablet   Oral   Take 1,200 mg by mouth 3 (three) times daily. Reported on 04/02/2015         . HYDROcodone-acetaminophen (NORCO/VICODIN) 5-325 MG per tablet   Oral   Take 2 tablets by mouth every 6 (six) hours as needed for moderate pain. Reported on 04/02/2015         . Icosapent Ethyl 1 G CAPS   Oral   Take 2 g by mouth 2 (two) times daily.         . Insulin Degludec 100 UNIT/ML SOPN   Subcutaneous   Inject 10 Units into the skin Nightly.         . ISOSORBIDE MONONITRATE ER PO   Oral   Take 60 mg by mouth 2 (two) times daily.          . metFORMIN (GLUCOPHAGE) 1000 MG tablet   Oral   Take 1,000 mg by mouth 2 (two) times daily with a meal.         . metoprolol tartrate (LOPRESSOR) 25 MG tablet   Oral   Take 25 mg by mouth daily.         . nefazodone (SERZONE) 50 MG tablet   Oral   Take 150 mg by mouth daily as needed (at bedtime). Reported on 03/06/2015         . nitroGLYCERIN (NITROSTAT) 0.4 MG SL tablet   Sublingual   Place 0.4 mg under the tongue every 5 (five) minutes as needed for chest pain.         Marland Kitchen oxyCODONE-acetaminophen (PERCOCET/ROXICET) 5-325 MG per tablet   Oral   Take 1-2 tablets by mouth every 4 (four) hours as needed for moderate pain. Patient not taking: Reported on 04/02/2015   90 tablet   0   . Ranolazine (RANEXA PO)   Oral   Take 1,000 mg by mouth 2 (two) times daily.         . sitaGLIPtin (JANUVIA) 100 MG tablet   Oral   Take 100 mg by mouth every morning.         . sucralfate (CARAFATE) 1 G tablet   Oral   Take 1 g by mouth 3 (three) times daily.         Marland Kitchen  tiZANidine (ZANAFLEX) 4 MG tablet   Oral   Take 4 mg by mouth every 8 (eight) hours as needed for muscle spasms.         Marland Kitchen Umeclidinium Bromide (INCRUSE ELLIPTA IN)   Inhalation   Inhale into the lungs daily.         . varenicline (CHANTIX) 0.5 MG tablet   Oral   Take 0.5 mg by mouth  daily.           Allergies Naproxen and Penicillins  Family History  Problem Relation Age of Onset  . Cancer Mother   . Diabetes Mother   . Anuerysm Father     Social History Social History  Substance Use Topics  . Smoking status: Current Every Day Smoker -- 1.00 packs/day for 25 years    Types: Cigarettes  . Smokeless tobacco: None  . Alcohol Use: No    Review of Systems Constitutional: Negative for fever. Cardiovascular: Positive for chest pain intermittent, chronic per patient. Respiratory: Negative for shortness of breath. Gastrointestinal: Negative for abdominal pain Musculoskeletal: Negative for back pain. Neurological: Moderate headache. Denies any focal weakness or numbness. 10-point ROS otherwise negative.  ____________________________________________   PHYSICAL EXAM:  VITAL SIGNS: ED Triage Vitals  Enc Vitals Group     BP 04/29/15 1935 117/100 mmHg     Pulse Rate 04/29/15 1935 71     Resp 04/29/15 1935 12     Temp 04/29/15 1935 97.7 F (36.5 C)     Temp Source 04/29/15 1935 Oral     SpO2 04/29/15 1935 97 %     Weight --      Height --      Head Cir --      Peak Flow --      Pain Score 04/29/15 1933 8     Pain Loc --      Pain Edu? --      Excl. in [place]? --     Constitutional: Alert and oriented. Well appearing and in no distress. Eyes: Normal exam ENT   Head: Normocephalic and atraumatic.   Mouth/Throat: Mucous membranes are moist. Cardiovascular: Normal rate, regular rhythm. No murmur Respiratory: Normal respiratory effort without tachypnea nor retractions. Breath sounds are clear  Gastrointestinal: Soft and nontender. No distention.   Musculoskeletal: Nontender with normal range of motion in all extremities. No lower extremity tenderness or edema. Neurologic:  Normal speech and language. No gross focal neurologic deficits./5 motor in all extremities. No pronator drift. Skin:  Skin is warm, dry and intact.  Psychiatric: Mood and  affect are normal. Speech and behavior are normal.   ____________________________________________    EKG  EKG reviewed and interpreted by my associate normal sinus rhythm at 70 bpm, narrow QRS, normal axis, normal intervals, no concerning ST changes.  ____________________________________________    RADIOLOGY  CT head shows no acute abnormality. Chest x-ray shows no acute disease.  ____________________________________________   INITIAL IMPRESSION / ASSESSMENT AND PLAN / ED COURSE  Pertinent labs & imaging results that were available during my care of the patient were reviewed by me and considered in my medical decision making (see chart for details).  Patient with his emergency department with a headache. Initially hypotensive per EMS however patient states he just taken a nitroglycerin tablet prior to their arrival for intermittent chest pain which she states is normal for him. We will check labs including troponin. We'll obtain a CT head. Patient does not have any focal neurologic deficits on exam. We  will also obtain a chest x-ray. Currently awaiting lab results. No distress at this time blood pressure currently [id number] systolic. Patient sees Dr.Khan, as his cardiologist.  Patient's workup is largely within normal limits. Patient remained hypotensive currently 87/60. Patient's troponin is negative. I suspect his hypotension is likely medication induced. The patient also does have mild renal insufficiency with a creatinine 1.9. He is receiving posted 2 L of IV fluids but remains hypotensive. Given his intermittent chest pain, hypotension, with mild renal insufficiency will admit the patient to the hospital for further treatment and workup.  ____________________________________________   FINAL CLINICAL IMPRESSION(S) / ED DIAGNOSES  Headache Chest pain Hypotension  Harvest Dark, MD 04/29/15 2128

## 2015-04-29 NOTE — H&P (Signed)
[facility] [place] NAME: Jose Carroll    MR#:  [id number]  DATE OF BIRTH:  07-16-1965  DATE OF ADMISSION:  04/29/2015  PRIMARY CARE PHYSICIAN: Perrin Maltese, MD   REQUESTING/REFERRING PHYSICIAN: Harvest Dark, MD  CHIEF COMPLAINT:   Chief Complaint  Patient presents with  . Headache  . Hypotension    HISTORY OF PRESENT ILLNESS:  Jose Carroll  is a 50 y.o. male with a known history of diabetes, COPD, arthritis, hypertension, presents the emergency department with a headache for the past 3 days. According to the patient for the past 3 days he has had a frontal headache moderate in severity. States he's never had a headache lasting more than one day. He states he is also been having intermittent chest pain although he states his chest pain is actually improved from his baseline chest pain. Patient is prescribed imdur as well as sublingual nitroglycerin.Patient states he took a nitroglycerin tablet prior to EMS arrival. EMS states patient hypotensive in the 70s upon arrival however patient admits he just taken a nitroglycerin tablet. Patient states a recent nuclear stress test about a week ago with Dr Humphrey Rolls and was neg although says has 100% blockage of 1 vessel. Patient does state shortness of breath but states that is chronic due to COPD, slightly worse over the past few days. Occasional cough denies any sputum production. Denies any fever, nausea, vomiting. Describes his chest pain is mild to moderate, intermittent, large and unchanged from baseline per patient. Due to persistent Hypotension, chest pain (8/10) and headache he is being admitted for further evaluation and management. PAST MEDICAL HISTORY:   Past Medical History  Diagnosis Date  . Diabetes mellitus without complication ([place])   . COPD (chronic obstructive pulmonary disease) ([place])   . Shortness of breath   . Arthritis   . Hypertension     dr Humphrey Rolls   [place]  .  GERD (gastroesophageal reflux disease)     PAST SURGICAL HISTORY:   Past Surgical History  Procedure Laterality Date  . Back surgery      x3  . Finger surgery      sixth finger removed  . Hernia repair    . Cardiac catheterization N/A 09/10/2014    Procedure: Left Heart Cath and Coronary Angiogram ;  Surgeon: Dionisio David, MD;  Location: [facility];  Service: Cardiovascular;  Laterality: N/A;  . Fetal surgery for congenital hernia  2012    SOCIAL HISTORY:   Social History  Substance Use Topics  . Smoking status: Current Every Day Smoker -- 1.00 packs/day for 25 years    Types: Cigarettes  . Smokeless tobacco: Not on file  . Alcohol Use: No    FAMILY HISTORY:   Family History  Problem Relation Age of Onset  . Cancer Mother   . Diabetes Mother   . Anuerysm Father     DRUG ALLERGIES:   Allergies  Allergen Reactions  . Naproxen Rash  . Penicillins Hives and Other (See Comments)    Has patient had a PCN reaction causing immediate rash, facial/tongue/throat swelling, SOB or lightheadedness with hypotension: No Has patient had a PCN reaction causing severe rash involving mucus membranes or skin necrosis: No Has patient had a PCN reaction that required hospitalization No Has patient had a PCN reaction occurring within the last 10 years: No If all of the above answers are "NO", then may proceed with Cephalosporin use.  REVIEW OF SYSTEMS:   Review of Systems  Constitutional: Negative for fever, weight loss, malaise/fatigue and diaphoresis.  HENT: Negative for ear discharge, ear pain, hearing loss, nosebleeds, sore throat and tinnitus.   Eyes: Negative for blurred vision and pain.  Respiratory: Negative for cough, hemoptysis, shortness of breath and wheezing.   Cardiovascular: Positive for chest pain. Negative for palpitations, orthopnea and leg swelling.  Gastrointestinal: Negative for heartburn, nausea, vomiting, abdominal pain, diarrhea, constipation and  blood in stool.  Genitourinary: Negative for dysuria, urgency and frequency.  Musculoskeletal: Negative for myalgias and back pain.  Skin: Negative for itching and rash.  Neurological: Positive for headaches. Negative for dizziness, tingling, tremors, focal weakness, seizures and weakness.  Psychiatric/Behavioral: Negative for depression. The patient is not nervous/anxious.     MEDICATIONS AT HOME:   Prior to Admission medications   Medication Sig Start Date End Date Taking? Authorizing Provider  albuterol (PROVENTIL HFA;VENTOLIN HFA) 108 (90 BASE) MCG/ACT inhaler Inhale 2 puffs into the lungs every 6 (six) hours as needed for wheezing or shortness of breath.    Yes Historical Provider, MD  amLODipine-benazepril (LOTREL) 10-40 MG per capsule Take 1 capsule by mouth daily.   Yes Historical Provider, MD  aspirin EC 81 MG tablet Take 81 mg by mouth daily.   Yes Historical Provider, MD  atorvastatin (LIPITOR) 80 MG tablet Take 80 mg by mouth daily.   Yes Historical Provider, MD  clopidogrel (PLAVIX) 75 MG tablet Take 75 mg by mouth daily.   Yes Historical Provider, MD  dexlansoprazole (DEXILANT) 60 MG capsule Take 60 mg by mouth daily.    Yes Historical Provider, MD  doxazosin (CARDURA XL) 4 MG 24 hr tablet Take 4 mg by mouth daily with breakfast.   Yes Historical Provider, MD  fenofibrate (TRICOR) 145 MG tablet Take 145 mg by mouth daily.   Yes Historical Provider, MD  Icosapent Ethyl (VASCEPA) 1 g CAPS Take 2 g by mouth 2 (two) times daily.   Yes Historical Provider, MD  Insulin Degludec (TRESIBA FLEXTOUCH) 100 UNIT/ML SOPN Inject 10 Units into the skin at bedtime.   Yes Historical Provider, MD  isosorbide mononitrate (IMDUR) 60 MG 24 hr tablet Take 60 mg by mouth 2 (two) times daily.   Yes Historical Provider, MD  metFORMIN (GLUCOPHAGE) 1000 MG tablet Take 1,000 mg by mouth 2 (two) times daily with a meal.   Yes Historical Provider, MD  metoprolol succinate (TOPROL-XL) 25 MG 24 hr tablet Take  25 mg by mouth daily.   Yes Historical Provider, MD  nitroGLYCERIN (NITROSTAT) 0.4 MG SL tablet Place 0.4 mg under the tongue every 5 (five) minutes as needed for chest pain.   Yes Historical Provider, MD  pregabalin (LYRICA) 75 MG capsule Take 75 mg by mouth every 8 (eight) hours.   Yes Historical Provider, MD  ranolazine (RANEXA) 1000 MG SR tablet Take 1,000 mg by mouth 2 (two) times daily.   Yes Historical Provider, MD  sitaGLIPtin (JANUVIA) 100 MG tablet Take 100 mg by mouth daily.    Yes Historical Provider, MD  tiZANidine (ZANAFLEX) 4 MG tablet Take 4 mg by mouth every 8 (eight) hours as needed for muscle spasms.   Yes Historical Provider, MD  umeclidinium bromide (INCRUSE ELLIPTA) 62.5 MCG/INH AEPB Inhale 1 puff into the lungs daily.   Yes Historical Provider, MD  varenicline (CHANTIX) 0.5 MG tablet Take 0.5 mg by mouth daily.   Yes Historical Provider, MD  ciprofloxacin (CIPRO) 250 MG tablet Take 1  tablet (250 mg total) by mouth 2 (two) times daily. Patient not taking: Reported on 04/29/2015 04/02/15   Mohammed Kindle, MD      VITAL SIGNS:  Blood pressure 95/56, pulse 68, temperature 97.7 F (36.5 C), temperature source Oral, resp. rate 14, SpO2 97 %.  PHYSICAL EXAMINATION:  Physical Exam  Constitutional: He is oriented to person, place, and time and well-developed, well-nourished, and in no distress.  HENT:  Head: Normocephalic and atraumatic.  Eyes: Conjunctivae and EOM are normal. Pupils are equal, round, and reactive to light.  Neck: Normal range of motion. Neck supple. No tracheal deviation present. No thyromegaly present.  Cardiovascular: Normal rate, regular rhythm and normal heart sounds.   Pulmonary/Chest: Effort normal and breath sounds normal. No respiratory distress. He has no wheezes. He exhibits no tenderness.  Abdominal: Soft. Bowel sounds are normal. He exhibits no distension. There is no tenderness.  Musculoskeletal: Normal range of motion.  Neurological: He is alert  and oriented to person, place, and time. No cranial nerve deficit.  Skin: Skin is warm and dry. No rash noted.  Psychiatric: Mood and affect normal.   LABORATORY PANEL:   CBC  Recent Labs Lab 04/29/15 1938  WBC 7.1  HGB 13.2  HCT 39.0*  PLT 156   ------------------------------------------------------------------------------------------------------------------  Chemistries   Recent Labs Lab 04/29/15 1938  NA 136  K 4.2  CL 103  CO2 24  GLUCOSE 83  BUN 13  CREATININE 1.91*  CALCIUM 9.2  AST 27  ALT 22  ALKPHOS 45  BILITOT 0.5   ------------------------------------------------------------------------------------------------------------------  Cardiac Enzymes  Recent Labs Lab 04/29/15 1938  TROPONINI <0.03   ------------------------------------------------------------------------------------------------------------------  RADIOLOGY:  Dg Chest 2 View  04/29/2015  CLINICAL DATA:  Headache, chest pain and shortness of breath for 3 days. Initial encounter. EXAM: CHEST  2 VIEW COMPARISON:  PA and lateral chest 02/07/2013. FINDINGS: The lungs are clear. Heart size is normal. No pneumothorax or pleural effusion. No focal bony abnormality. IMPRESSION: No acute disease. Electronically Signed   By: Inge Rise M.D.   On: 04/29/2015 20:07   Ct Head Wo Contrast  04/29/2015  CLINICAL DATA:  Headache for 3 days. EXAM: CT HEAD WITHOUT CONTRAST TECHNIQUE: Contiguous axial images were obtained from the base of the skull through the vertex without intravenous contrast. COMPARISON:  None. FINDINGS: Brain: No evidence of acute infarction, hemorrhage, extra-axial collection, ventriculomegaly, or mass effect. Mild but age advanced cerebral atrophy. Prominent posterior fossa CSF space may be an arachnoid cyst or mega cisterna magna. Vascular: No hyperdense vessel or unexpected calcification. Skull: Negative for fracture or focal lesion. Sinuses/Orbits: No acute findings. Other: None.  IMPRESSION: 1.  No acute intracranial abnormality. 2. Generalized atrophy, mild, however advanced for patient age. Electronically Signed   By: Jeb Levering M.D.   On: 04/29/2015 20:18   IMPRESSION AND PLAN:  20 y m with k/h/o diabetes, COPD, arthritis, hypertension being admitted for persistent hypotension, headache and chest pain  * Chest pain - R/o serial troponins - had recent stress test week ago which was neg per pt - c/s dr Humphrey Rolls who knows him well - pt reports having 100% dz in 1 vessel - continue ranexa  * Persistent Hypotension - likely iatrogenic (he's on multiple different BP meds - quite a few of those are new) - IVF and monitor  * ARF - likely prerenal but can't r/o ATN due to persistent hypotension - avoid nephrotoxic meds - IVFs and monitor  * Persistent severe headache -  likely due to nitro - discontinue nitro and monitor    All the records are reviewed and case discussed with ED provider. Management plans discussed with the patient, family and they are in agreement.  CODE STATUS: FULL CODE  TOTAL TIME TAKING CARE OF THIS PATIENT: 45 minutes.    Jose Carroll, Jose Carroll M.D on 04/29/2015 at 10:40 PM  Between 7am to 6pm - Pager - [phone]  After 6pm go to [email or web address] - password [email or web address] [facility]  Office  [phone]  CC: Primary care physician; Perrin Maltese, MD   Note: This dictation was prepared with Dragon dictation along with smaller phrase technology. Any transcriptional errors that result from this process are unintentional.

## 2015-04-29 NOTE — ED Notes (Signed)
Pt c/o headache x3days that started to affect his equilibrium today, states he also has some CP and SOB. Pt A&O. NS on monitor

## 2015-04-30 DIAGNOSIS — R51 Headache: Secondary | ICD-10-CM | POA: Diagnosis not present

## 2015-04-30 LAB — CBC
HCT: 37.9 % — ABNORMAL LOW (ref 40.0–52.0)
Hemoglobin: 12.7 g/dL — ABNORMAL LOW (ref 13.0–18.0)
MCH: 28.2 pg (ref 26.0–34.0)
MCHC: 33.5 g/dL (ref 32.0–36.0)
MCV: 84.2 fL (ref 80.0–100.0)
PLATELETS: 120 10*3/uL — AB (ref 150–440)
RBC: 4.5 MIL/uL (ref 4.40–5.90)
RDW: 15.6 % — AB (ref 11.5–14.5)
WBC: 6 10*3/uL (ref 3.8–10.6)

## 2015-04-30 LAB — BASIC METABOLIC PANEL
Anion gap: 4 — ABNORMAL LOW (ref 5–15)
BUN: 14 mg/dL (ref 6–20)
CALCIUM: 8.4 mg/dL — AB (ref 8.9–10.3)
CO2: 24 mmol/L (ref 22–32)
CREATININE: 1.71 mg/dL — AB (ref 0.61–1.24)
Chloride: 107 mmol/L (ref 101–111)
GFR calc Af Amer: 53 mL/min — ABNORMAL LOW (ref >60)
GFR, EST NON AFRICAN AMERICAN: 46 mL/min — AB (ref >60)
Glucose, Bld: 117 mg/dL — ABNORMAL HIGH (ref 65–99)
Potassium: 4.1 mmol/L (ref 3.5–5.1)
SODIUM: 135 mmol/L (ref 135–145)

## 2015-04-30 LAB — GLUCOSE, CAPILLARY
GLUCOSE-CAPILLARY: 108 mg/dL — AB (ref 65–99)
Glucose-Capillary: 86 mg/dL (ref 65–99)

## 2015-04-30 LAB — MRSA PCR SCREENING: MRSA BY PCR: NEGATIVE

## 2015-04-30 MED ORDER — ACETAMINOPHEN 650 MG RE SUPP
650.0000 mg | Freq: Four times a day (QID) | RECTAL | Status: DC | PRN
Start: 2015-04-30 — End: 2015-04-30

## 2015-04-30 MED ORDER — ALBUTEROL SULFATE (2.5 MG/3ML) 0.083% IN NEBU
2.5000 mg | INHALATION_SOLUTION | Freq: Four times a day (QID) | RESPIRATORY_TRACT | Status: DC | PRN
  Administered 2015-04-30: 2.5 mg via RESPIRATORY_TRACT
  Filled 2015-04-30: qty 3

## 2015-04-30 MED ORDER — ALBUTEROL SULFATE (2.5 MG/3ML) 0.083% IN NEBU: 2.5000 mg | INHALATION_SOLUTION | Freq: Four times a day (QID) | RESPIRATORY_TRACT | Status: DC | PRN

## 2015-04-30 MED ORDER — ALBUTEROL SULFATE HFA 108 (90 BASE) MCG/ACT IN AERS: 2.0000 | INHALATION_SPRAY | Freq: Four times a day (QID) | RESPIRATORY_TRACT | Status: DC | PRN

## 2015-04-30 MED ORDER — LINAGLIPTIN 5 MG PO TABS
5.0000 mg | ORAL_TABLET | Freq: Every day | ORAL | Status: DC
  Administered 2015-04-30: 5 mg via ORAL
  Filled 2015-04-30: qty 1

## 2015-04-30 MED ORDER — SODIUM CHLORIDE 0.9 % IV SOLN
INTRAVENOUS | Status: DC
  Administered 2015-04-30: 02:00:00 via INTRAVENOUS

## 2015-04-30 MED ORDER — RANOLAZINE ER 500 MG PO TB12
1000.0000 mg | ORAL_TABLET | Freq: Two times a day (BID) | ORAL | Status: DC
Start: 2015-04-30 — End: 2015-04-30
  Administered 2015-04-30 (×2): 1000 mg via ORAL
  Filled 2015-04-30 (×2): qty 2

## 2015-04-30 MED ORDER — FENOFIBRATE 54 MG PO TABS
54.0000 mg | ORAL_TABLET | Freq: Every day | ORAL | Status: DC
  Administered 2015-04-30: 54 mg via ORAL
  Filled 2015-04-30: qty 1

## 2015-04-30 MED ORDER — HEPARIN SODIUM (PORCINE) 5000 UNIT/ML IJ SOLN
5000.0000 [IU] | Freq: Three times a day (TID) | INTRAMUSCULAR | Status: DC
  Administered 2015-04-30: 5000 [IU] via SUBCUTANEOUS
  Filled 2015-04-30 (×2): qty 1

## 2015-04-30 MED ORDER — ONDANSETRON HCL 4 MG PO TABS: 4.0000 mg | ORAL_TABLET | Freq: Four times a day (QID) | ORAL | Status: DC | PRN

## 2015-04-30 MED ORDER — PREGABALIN 75 MG PO CAPS
75.0000 mg | ORAL_CAPSULE | Freq: Three times a day (TID) | ORAL | Status: DC
  Administered 2015-04-30 (×2): 75 mg via ORAL
  Filled 2015-04-30 (×2): qty 1

## 2015-04-30 MED ORDER — ACETAMINOPHEN 325 MG PO TABS
650.0000 mg | ORAL_TABLET | Freq: Four times a day (QID) | ORAL | Status: DC | PRN
  Administered 2015-04-30: 650 mg via ORAL
  Filled 2015-04-30: qty 2

## 2015-04-30 MED ORDER — DOCUSATE SODIUM 100 MG PO CAPS
100.0000 mg | ORAL_CAPSULE | Freq: Two times a day (BID) | ORAL | Status: DC
  Administered 2015-04-30 (×2): 100 mg via ORAL
  Filled 2015-04-30 (×2): qty 1

## 2015-04-30 MED ORDER — METFORMIN HCL 500 MG PO TABS
1000.0000 mg | ORAL_TABLET | Freq: Two times a day (BID) | ORAL | Status: DC
  Administered 2015-04-30: 1000 mg via ORAL
  Filled 2015-04-30: qty 2

## 2015-04-30 MED ORDER — BISACODYL 5 MG PO TBEC: 5.0000 mg | DELAYED_RELEASE_TABLET | Freq: Every day | ORAL | Status: DC | PRN

## 2015-04-30 MED ORDER — SODIUM CHLORIDE 0.9% FLUSH
3.0000 mL | Freq: Two times a day (BID) | INTRAVENOUS | Status: DC
  Administered 2015-04-30 (×2): 3 mL via INTRAVENOUS

## 2015-04-30 MED ORDER — ONDANSETRON HCL 4 MG/2ML IJ SOLN: 4.0000 mg | Freq: Four times a day (QID) | INTRAMUSCULAR | Status: DC | PRN

## 2015-04-30 NOTE — Progress Notes (Signed)
Patient discharged.  IV's and telemetry removed.  Patient given discharge instructions.  Patient escorted in wheelchair by auxillary and friend.

## 2015-04-30 NOTE — Care Management Obs Status (Signed)
[place] NOTIFICATION   Patient Details  Name: Jose Carroll MRN: [id number] Date of Birth: 1965-09-19   Medicare Observation Status Notification Given:  Yes    Marshell Garfinkel, RN 04/30/2015, 11:43 AM

## 2015-04-30 NOTE — Progress Notes (Addendum)
Patient ID: Abdirashid Drennon, male   DOB: [birth year], 50 y.o.   MRN: [id number] [facility] [place] [facility] NAME: Otto Spagnoli    MR#:  [id number]  DATE OF BIRTH:  Jul 21, 1965  SUBJECTIVE:  Presented to the emergency room with chest pain on and off for several weeks and headache now resolved remains in sinus rhythm  REVIEW OF SYSTEMS:   Review of Systems  Constitutional: Negative for fever, chills and weight loss.  HENT: Negative for ear discharge, ear pain and nosebleeds.   Eyes: Negative for blurred vision, pain and discharge.  Respiratory: Negative for sputum production, shortness of breath, wheezing and stridor.   Cardiovascular: Negative for chest pain, palpitations, orthopnea and PND.  Gastrointestinal: Negative for nausea, vomiting, abdominal pain and diarrhea.  Genitourinary: Negative for urgency and frequency.  Musculoskeletal: Negative for back pain and joint pain.  Neurological: Negative for sensory change, speech change, focal weakness and weakness.  Psychiatric/Behavioral: Negative for depression and hallucinations. The patient is not nervous/anxious.   All other systems reviewed and are negative.  Tolerating Diet: yes Tolerating PT: Not needed  DRUG ALLERGIES:   Allergies  Allergen Reactions  . Naproxen Rash  . Penicillins Hives and Other (See Comments)    Has patient had a PCN reaction causing immediate rash, facial/tongue/throat swelling, SOB or lightheadedness with hypotension: No Has patient had a PCN reaction causing severe rash involving mucus membranes or skin necrosis: No Has patient had a PCN reaction that required hospitalization No Has patient had a PCN reaction occurring within the last 10 years: No If all of the above answers are "NO", then may proceed with Cephalosporin use.    VITALS:  Blood pressure 114/73, pulse 65, temperature 97.6 F (36.4 C), temperature source Axillary, resp. rate 12, height  5\' 10"  (1.778 m), weight 96.3 kg (212 lb 4.9 oz), SpO2 96 %.  PHYSICAL EXAMINATION:   Physical Exam  GENERAL:  50 y.o.-year-old patient lying in the bed with no acute distress.  EYES: Pupils equal, round, reactive to light and accommodation. No scleral icterus. Extraocular muscles intact.  HEENT: Head atraumatic, normocephalic. Oropharynx and nasopharynx clear.  NECK:  Supple, no jugular venous distention. No thyroid enlargement, no tenderness.  LUNGS: Normal breath sounds bilaterally, no wheezing, rales, rhonchi. No use of accessory muscles of respiration.  CARDIOVASCULAR: S1, S2 normal. No murmurs, rubs, or gallops.  ABDOMEN: Soft, nontender, nondistended. Bowel sounds present. No organomegaly or mass.  EXTREMITIES: No cyanosis, clubbing or edema b/l.    NEUROLOGIC: Cranial nerves II through XII are intact. No focal Motor or sensory deficits b/l.   PSYCHIATRIC:  patient is alert and oriented x 3.  SKIN: No obvious rash, lesion, or ulcer.   LABORATORY PANEL:  CBC  Recent Labs Lab 04/30/15 0350  WBC 6.0  HGB 12.7*  HCT 37.9*  PLT 120*    Chemistries   Recent Labs Lab 04/29/15 1938 04/30/15 0350  NA 136 135  K 4.2 4.1  CL 103 107  CO2 24 24  GLUCOSE 83 117*  BUN 13 14  CREATININE 1.91* 1.71*  CALCIUM 9.2 8.4*  AST 27  --   ALT 22  --   ALKPHOS 45  --   BILITOT 0.5  --    Cardiac Enzymes  Recent Labs Lab 04/29/15 1938  TROPONINI <0.03   RADIOLOGY:  Dg Chest 2 View  04/29/2015  CLINICAL DATA:  Headache, chest pain and shortness of breath for 3 days.  Initial encounter. EXAM: CHEST  2 VIEW COMPARISON:  PA and lateral chest 02/07/2013. FINDINGS: The lungs are clear. Heart size is normal. No pneumothorax or pleural effusion. No focal bony abnormality. IMPRESSION: No acute disease. Electronically Signed   By: Inge Rise M.D.   On: 04/29/2015 20:07   Ct Head Wo Contrast  04/29/2015  CLINICAL DATA:  Headache for 3 days. EXAM: CT HEAD WITHOUT CONTRAST  TECHNIQUE: Contiguous axial images were obtained from the base of the skull through the vertex without intravenous contrast. COMPARISON:  None. FINDINGS: Brain: No evidence of acute infarction, hemorrhage, extra-axial collection, ventriculomegaly, or mass effect. Mild but age advanced cerebral atrophy. Prominent posterior fossa CSF space may be an arachnoid cyst or mega cisterna magna. Vascular: No hyperdense vessel or unexpected calcification. Skull: Negative for fracture or focal lesion. Sinuses/Orbits: No acute findings. Other: None. IMPRESSION: 1.  No acute intracranial abnormality. 2. Generalized atrophy, mild, however advanced for patient age. Electronically Signed   By: Jeb Levering M.D.   On: 04/29/2015 20:18   ASSESSMENT AND PLAN:  31 y m with k/h/o diabetes, COPD, arthritis, hypertension being admitted for persistent hypotension, headache and chest pain  * Chest pain, chronic for about 1 month - serial troponin negative, EKG NSR - had recent stress test week ago which was neg per pt - c/s dr Humphrey Rolls who knows him well - pt reports having 100% dz in 1 vessel - continue ranexa  * Persistent Hypotension - likely iatrogenic (he's on multiple different BP meds - quite a few of those are new) -received  IVF and  Now better  * ARF - likely prerenal  - avoid nephrotoxic meds - IVFs  -improved with fluids.  * Persistent severe headache-resolved - likely due to nitro - discontinue nitro and monitor  -overall feels better. Await cardiology recommendations. Case discussed with Care Management/Social Worker. Management plans discussed with the patient and  in agreement.  CODE STATUS: full  DVT Prophylaxis: lovenox TOTAL TIME TAKING CARE OF THIS PATIENT:30 minutes.  >50% time spent on counselling and coordination of care  POSSIBLE D/C IN 1 DAYS, DEPENDING ON CLINICAL CONDITION.  Note: This dictation was prepared with Dragon dictation along with smaller phrase technology. Any  transcriptional errors that result from this process are unintentional.  Waymond Meador M.D on 04/30/2015 at 7:42 AM  Between 7am to 6pm - Pager - [phone]  After 6pm go to [email or web address] - password [email or web address] [facility]  Office  [phone]  CC: Primary care physician; Perrin Maltese, MD patient presented to the emergency room with on and off chest pain for several weeks and headache

## 2015-04-30 NOTE — Care Management (Signed)
Patient eager to discharge home today. He denies RNCM needs.

## 2015-04-30 NOTE — Discharge Summary (Signed)
[facility] [place] NAME: Jose Carroll    MR#:  [id number]  DATE OF BIRTH:  Jun 17, 1965  DATE OF ADMISSION:  04/29/2015 ADMITTING PHYSICIAN: Max Sane, MD  DATE OF DISCHARGE: 04/30/15  PRIMARY CARE PHYSICIAN: Perrin Maltese, MD    ADMISSION DIAGNOSIS:  Hypotension, unspecified hypotension type [I95.9] Chest pain, unspecified chest pain type [R07.9] Headache, unspecified headache type [R51]  DISCHARGE DIAGNOSIS:  Headache resolved Chest pain/chronic angina HTN Dm-2  SECONDARY DIAGNOSIS:   Past Medical History  Diagnosis Date  . Diabetes mellitus without complication ([place])   . COPD (chronic obstructive pulmonary disease) ([place])   . Shortness of breath   . Arthritis   . Hypertension     dr Humphrey Rolls   [place]  . GERD (gastroesophageal reflux disease)     HOSPITAL COURSE:   68 y m with k/h/o diabetes, COPD, arthritis, hypertension being admitted for persistent hypotension, headache and chest pain  * Chest pain, chronic for about 1 month - serial troponin negative, EKG NSR - had recent stress test week ago which was neg per pt - c/s dr Humphrey Rolls who knows him well - pt reports having 100% dz in 1 vessel - continue ranexa -d/w dr Clayborn Bigness. Since w/u is negative and pt asymptomatic no further cardiac work up needed. Follow-up with Dr. Yancey Flemings as outpatient.  * Persistent Hypotension - likely iatrogenic (he's on multiple different BP meds - quite a few of those are new) -received  IVF and  Now better  * ARF - likely prerenal  - avoid nephrotoxic meds -improved with fluids.  * Persistent severe headache-resolved - likely due to nitro - discontinue nitro and monitor  -overall feels better. We'll discharge patient to home. Recommended outpatient follow-up with Dr. Darrow Bussing.  CONSULTS OBTAINED:  Treatment Team:  Dionisio David, MD Yolonda Kida, MD  DRUG ALLERGIES:   Allergies  Allergen Reactions  . Naproxen Rash   . Penicillins Hives and Other (See Comments)    Has patient had a PCN reaction causing immediate rash, facial/tongue/throat swelling, SOB or lightheadedness with hypotension: No Has patient had a PCN reaction causing severe rash involving mucus membranes or skin necrosis: No Has patient had a PCN reaction that required hospitalization No Has patient had a PCN reaction occurring within the last 10 years: No If all of the above answers are "NO", then may proceed with Cephalosporin use.    DISCHARGE MEDICATIONS:   Current Discharge Medication List    CONTINUE these medications which have NOT CHANGED   Details  albuterol (PROVENTIL HFA;VENTOLIN HFA) 108 (90 BASE) MCG/ACT inhaler Inhale 2 puffs into the lungs every 6 (six) hours as needed for wheezing or shortness of breath.     amLODipine-benazepril (LOTREL) 10-40 MG per capsule Take 1 capsule by mouth daily.    aspirin EC 81 MG tablet Take 81 mg by mouth daily.    atorvastatin (LIPITOR) 80 MG tablet Take 80 mg by mouth daily.    clopidogrel (PLAVIX) 75 MG tablet Take 75 mg by mouth daily.    dexlansoprazole (DEXILANT) 60 MG capsule Take 60 mg by mouth daily.     doxazosin (CARDURA XL) 4 MG 24 hr tablet Take 4 mg by mouth daily with breakfast.    fenofibrate (TRICOR) 145 MG tablet Take 145 mg by mouth daily.    Icosapent Ethyl (VASCEPA) 1 g CAPS Take 2 g by mouth 2 (two) times daily.    Insulin Degludec (TRESIBA FLEXTOUCH)  100 UNIT/ML SOPN Inject 10 Units into the skin at bedtime.    isosorbide mononitrate (IMDUR) 60 MG 24 hr tablet Take 60 mg by mouth 2 (two) times daily.    metFORMIN (GLUCOPHAGE) 1000 MG tablet Take 1,000 mg by mouth 2 (two) times daily with a meal.    metoprolol succinate (TOPROL-XL) 25 MG 24 hr tablet Take 25 mg by mouth daily.    nitroGLYCERIN (NITROSTAT) 0.4 MG SL tablet Place 0.4 mg under the tongue every 5 (five) minutes as needed for chest pain.    pregabalin (LYRICA) 75 MG capsule Take 75 mg by mouth  every 8 (eight) hours.    ranolazine (RANEXA) 1000 MG SR tablet Take 1,000 mg by mouth 2 (two) times daily.    sitaGLIPtin (JANUVIA) 100 MG tablet Take 100 mg by mouth daily.     tiZANidine (ZANAFLEX) 4 MG tablet Take 4 mg by mouth every 8 (eight) hours as needed for muscle spasms.    umeclidinium bromide (INCRUSE ELLIPTA) 62.5 MCG/INH AEPB Inhale 1 puff into the lungs daily.    varenicline (CHANTIX) 0.5 MG tablet Take 0.5 mg by mouth daily.      STOP taking these medications     ciprofloxacin (CIPRO) 250 MG tablet         If you experience worsening of your admission symptoms, develop shortness of breath, life threatening emergency, suicidal or homicidal thoughts you must seek medical attention immediately by calling 911 or calling your MD immediately  if symptoms less severe.  You Must read complete instructions/literature along with all the possible adverse reactions/side effects for all the Medicines you take and that have been prescribed to you. Take any new Medicines after you have completely understood and accept all the possible adverse reactions/side effects.   Please note  You were cared for by a hospitalist during your hospital stay. If you have any questions about your discharge medications or the care you received while you were in the hospital after you are discharged, you can call the unit and asked to speak with the hospitalist on call if the hospitalist that took care of you is not available. Once you are discharged, your primary care physician will handle any further medical issues. Please note that NO REFILLS for any discharge medications will be authorized once you are discharged, as it is imperative that you return to your primary care physician (or establish a relationship with a primary care physician if you do not have one) for your aftercare needs so that they can reassess your need for medications and monitor your lab values.  DATA REVIEW:   CBC   Recent  Labs Lab 04/30/15 0350  WBC 6.0  HGB 12.7*  HCT 37.9*  PLT 120*    Chemistries   Recent Labs Lab 04/29/15 1938 04/30/15 0350  NA 136 135  K 4.2 4.1  CL 103 107  CO2 24 24  GLUCOSE 83 117*  BUN 13 14  CREATININE 1.91* 1.71*  CALCIUM 9.2 8.4*  AST 27  --   ALT 22  --   ALKPHOS 45  --   BILITOT 0.5  --     Microbiology Results   Recent Results (from the past 240 hour(s))  MRSA PCR Screening     Status: None   Collection Time: 04/30/15 12:44 AM  Result Value Ref Range Status   MRSA by PCR NEGATIVE NEGATIVE Final    Comment:        The GeneXpert MRSA Assay (FDA  approved for NASAL specimens only), is one component of a comprehensive MRSA colonization surveillance program. It is not intended to diagnose MRSA infection nor to guide or monitor treatment for MRSA infections.     RADIOLOGY:  Dg Chest 2 View  04/29/2015  CLINICAL DATA:  Headache, chest pain and shortness of breath for 3 days. Initial encounter. EXAM: CHEST  2 VIEW COMPARISON:  PA and lateral chest 02/07/2013. FINDINGS: The lungs are clear. Heart size is normal. No pneumothorax or pleural effusion. No focal bony abnormality. IMPRESSION: No acute disease. Electronically Signed   By: Inge Rise M.D.   On: 04/29/2015 20:07   Ct Head Wo Contrast  04/29/2015  CLINICAL DATA:  Headache for 3 days. EXAM: CT HEAD WITHOUT CONTRAST TECHNIQUE: Contiguous axial images were obtained from the base of the skull through the vertex without intravenous contrast. COMPARISON:  None. FINDINGS: Brain: No evidence of acute infarction, hemorrhage, extra-axial collection, ventriculomegaly, or mass effect. Mild but age advanced cerebral atrophy. Prominent posterior fossa CSF space may be an arachnoid cyst or mega cisterna magna. Vascular: No hyperdense vessel or unexpected calcification. Skull: Negative for fracture or focal lesion. Sinuses/Orbits: No acute findings. Other: None. IMPRESSION: 1.  No acute intracranial abnormality.  2. Generalized atrophy, mild, however advanced for patient age. Electronically Signed   By: Jeb Levering M.D.   On: 04/29/2015 20:18     Management plans discussed with the patient, family and they are in agreement.  CODE STATUS:     Code Status Orders        Start     Ordered   04/30/15 0038  Full code   Continuous     04/30/15 0037    Code Status History    Date Active Date Inactive Code Status Order ID Comments User Context   09/10/2014  9:49 AM 09/10/2014  1:57 PM Full Code [id number]  Dionisio David, MD Inpatient   02/14/2013  4:09 PM 02/16/2013  2:34 PM Full Code [id number]  Eustace Moore, MD Inpatient      TOTAL TIME TAKING CARE OF THIS PATIENT:40 minutes.    Johan Antonacci M.D on 04/30/2015 at 12:19 PM  Between 7am to 6pm - Pager - [phone] After 6pm go to [email or web address] - password [email or web address] [facility]  Office  [phone]  CC: Primary care physician; Perrin Maltese, MD

## 2015-04-30 NOTE — Therapy (Signed)
Nurse requested neb tx for patient. Patient found on room air with mild SOB. Patient reported increased SOB with increased use of rescue inhaler at home. PRN neb tx given. Patient stated he was breathing better after tx.

## 2015-05-01 ENCOUNTER — Ambulatory Visit: Payer: Medicare Other | Admitting: Pain Medicine

## 2015-05-07 ENCOUNTER — Ambulatory Visit
Admission: RE | Admit: 2015-05-07 | Discharge: 2015-05-07 | Disposition: A | Discharge status: Home / Self Care | Payer: Medicare Other | Source: Ambulatory Visit | Attending: Neurological Surgery | Admitting: Neurological Surgery

## 2015-05-07 DIAGNOSIS — M47816 Spondylosis without myelopathy or radiculopathy, lumbar region: Secondary | ICD-10-CM | POA: Insufficient documentation

## 2015-05-07 DIAGNOSIS — M4806 Spinal stenosis, lumbar region: Secondary | ICD-10-CM | POA: Insufficient documentation

## 2015-05-07 DIAGNOSIS — Z981 Arthrodesis status: Secondary | ICD-10-CM | POA: Diagnosis not present

## 2015-05-07 DIAGNOSIS — Z9889 Other specified postprocedural states: Secondary | ICD-10-CM | POA: Diagnosis not present

## 2015-05-07 DIAGNOSIS — M48061 Spinal stenosis, lumbar region without neurogenic claudication: Secondary | ICD-10-CM

## 2015-06-24 ENCOUNTER — Other Ambulatory Visit: Payer: Self-pay | Admitting: Cardiovascular Disease

## 2015-06-26 ENCOUNTER — Encounter
Admission: RE | Disposition: A | Discharge status: Home / Self Care | Payer: Self-pay | Source: Ambulatory Visit | Attending: Cardiovascular Disease

## 2015-06-26 ENCOUNTER — Encounter: Payer: Self-pay | Admitting: *Deleted

## 2015-06-26 ENCOUNTER — Ambulatory Visit
Admission: RE | Admit: 2015-06-26 | Discharge: 2015-06-26 | Disposition: A | Discharge status: Home / Self Care | Payer: Medicare Other | Source: Ambulatory Visit | Attending: Cardiovascular Disease | Admitting: Cardiovascular Disease

## 2015-06-26 DIAGNOSIS — N529 Male erectile dysfunction, unspecified: Secondary | ICD-10-CM | POA: Insufficient documentation

## 2015-06-26 DIAGNOSIS — Z880 Allergy status to penicillin: Secondary | ICD-10-CM | POA: Diagnosis not present

## 2015-06-26 DIAGNOSIS — E785 Hyperlipidemia, unspecified: Secondary | ICD-10-CM | POA: Insufficient documentation

## 2015-06-26 DIAGNOSIS — I10 Essential (primary) hypertension: Secondary | ICD-10-CM | POA: Diagnosis not present

## 2015-06-26 DIAGNOSIS — I25119 Atherosclerotic heart disease of native coronary artery with unspecified angina pectoris: Secondary | ICD-10-CM | POA: Insufficient documentation

## 2015-06-26 DIAGNOSIS — Z7982 Long term (current) use of aspirin: Secondary | ICD-10-CM | POA: Insufficient documentation

## 2015-06-26 DIAGNOSIS — Z833 Family history of diabetes mellitus: Secondary | ICD-10-CM | POA: Insufficient documentation

## 2015-06-26 DIAGNOSIS — Z8249 Family history of ischemic heart disease and other diseases of the circulatory system: Secondary | ICD-10-CM | POA: Insufficient documentation

## 2015-06-26 DIAGNOSIS — Z7902 Long term (current) use of antithrombotics/antiplatelets: Secondary | ICD-10-CM | POA: Diagnosis not present

## 2015-06-26 DIAGNOSIS — R079 Chest pain, unspecified: Secondary | ICD-10-CM | POA: Diagnosis not present

## 2015-06-26 DIAGNOSIS — Z8051 Family history of malignant neoplasm of kidney: Secondary | ICD-10-CM | POA: Diagnosis not present

## 2015-06-26 DIAGNOSIS — E1140 Type 2 diabetes mellitus with diabetic neuropathy, unspecified: Secondary | ICD-10-CM | POA: Insufficient documentation

## 2015-06-26 DIAGNOSIS — K219 Gastro-esophageal reflux disease without esophagitis: Secondary | ICD-10-CM | POA: Insufficient documentation

## 2015-06-26 DIAGNOSIS — Z8049 Family history of malignant neoplasm of other genital organs: Secondary | ICD-10-CM | POA: Diagnosis not present

## 2015-06-26 DIAGNOSIS — Z807 Family history of other malignant neoplasms of lymphoid, hematopoietic and related tissues: Secondary | ICD-10-CM | POA: Diagnosis not present

## 2015-06-26 DIAGNOSIS — Z7984 Long term (current) use of oral hypoglycemic drugs: Secondary | ICD-10-CM | POA: Diagnosis not present

## 2015-06-26 DIAGNOSIS — Z9889 Other specified postprocedural states: Secondary | ICD-10-CM | POA: Diagnosis not present

## 2015-06-26 DIAGNOSIS — I209 Angina pectoris, unspecified: Secondary | ICD-10-CM | POA: Diagnosis present

## 2015-06-26 DIAGNOSIS — Z79899 Other long term (current) drug therapy: Secondary | ICD-10-CM | POA: Insufficient documentation

## 2015-06-26 DIAGNOSIS — F17290 Nicotine dependence, other tobacco product, uncomplicated: Secondary | ICD-10-CM | POA: Diagnosis not present

## 2015-06-26 DIAGNOSIS — J449 Chronic obstructive pulmonary disease, unspecified: Secondary | ICD-10-CM | POA: Insufficient documentation

## 2015-06-26 DIAGNOSIS — M1990 Unspecified osteoarthritis, unspecified site: Secondary | ICD-10-CM | POA: Diagnosis not present

## 2015-06-26 HISTORY — DX: Atherosclerotic heart disease of native coronary artery without angina pectoris: I25.10

## 2015-06-26 HISTORY — PX: CARDIAC CATHETERIZATION: SHX172

## 2015-06-26 HISTORY — DX: Angina pectoris, unspecified: I20.9

## 2015-06-26 SURGERY — LEFT HEART CATH AND CORONARY ANGIOGRAPHY
Anesthesia: Moderate Sedation | Laterality: Left

## 2015-06-26 SURGERY — LEFT HEART CATH AND CORONARY ANGIOGRAPHY
Anesthesia: Moderate Sedation | Laterality: Right

## 2015-06-26 MED ORDER — SODIUM CHLORIDE 0.9% FLUSH: 3.0000 mL | INTRAVENOUS | Status: DC | PRN

## 2015-06-26 MED ORDER — SODIUM CHLORIDE 0.9 % WEIGHT BASED INFUSION: 1.0000 mL/kg/h | INTRAVENOUS | Status: DC

## 2015-06-26 MED ORDER — MIDAZOLAM HCL 2 MG/2ML IJ SOLN
INTRAMUSCULAR | Status: AC
  Filled 2015-06-26: qty 2

## 2015-06-26 MED ORDER — MIDAZOLAM HCL 2 MG/2ML IJ SOLN
INTRAMUSCULAR | Status: DC | PRN
  Administered 2015-06-26: 1 mg via INTRAVENOUS

## 2015-06-26 MED ORDER — FENTANYL CITRATE (PF) 100 MCG/2ML IJ SOLN
INTRAMUSCULAR | Status: AC
  Filled 2015-06-26: qty 2

## 2015-06-26 MED ORDER — SODIUM CHLORIDE 0.9 % IV SOLN: 250.0000 mL | INTRAVENOUS | Status: DC | PRN

## 2015-06-26 MED ORDER — SODIUM CHLORIDE 0.9% FLUSH: 3.0000 mL | Freq: Two times a day (BID) | INTRAVENOUS | Status: DC

## 2015-06-26 MED ORDER — HEPARIN (PORCINE) IN NACL 2-0.9 UNIT/ML-% IJ SOLN
INTRAMUSCULAR | Status: AC
  Filled 2015-06-26: qty 500

## 2015-06-26 MED ORDER — ASPIRIN 81 MG PO CHEW: 81.0000 mg | CHEWABLE_TABLET | ORAL | Status: DC

## 2015-06-26 MED ORDER — FENTANYL CITRATE (PF) 100 MCG/2ML IJ SOLN
INTRAMUSCULAR | Status: DC | PRN
  Administered 2015-06-26: 50 ug via INTRAVENOUS

## 2015-06-26 MED ORDER — SODIUM CHLORIDE 0.9 % WEIGHT BASED INFUSION: 3.0000 mL/kg/h | INTRAVENOUS | Status: DC

## 2015-06-26 MED ORDER — ACETAMINOPHEN 325 MG PO TABS: 650.0000 mg | ORAL_TABLET | ORAL | Status: DC | PRN

## 2015-06-26 MED ORDER — ONDANSETRON HCL 4 MG/2ML IJ SOLN: 4.0000 mg | Freq: Four times a day (QID) | INTRAMUSCULAR | Status: DC | PRN

## 2015-06-26 MED ORDER — IOPAMIDOL (ISOVUE-300) INJECTION 61%
INTRAVENOUS | Status: DC | PRN
  Administered 2015-06-26: 110 mL via INTRA_ARTERIAL

## 2015-06-26 SURGICAL SUPPLY — 10 items
CATH INFINITI 5 FR 3DRC (CATHETERS) ×2 IMPLANT
CATH INFINITI 5FR ANG PIGTAIL (CATHETERS) ×2 IMPLANT
CATH INFINITI 5FR JL4 (CATHETERS) ×2 IMPLANT
CATH INFINITI JR4 5F (CATHETERS) ×2 IMPLANT
DEVICE CLOSURE MYNXGRIP 5F (Vascular Products) ×2 IMPLANT
KIT MANI 3VAL PERCEP (MISCELLANEOUS) ×2 IMPLANT
NEEDLE PERC 18GX7CM (NEEDLE) ×2 IMPLANT
PACK CARDIAC CATH (CUSTOM PROCEDURE TRAY) ×2 IMPLANT
SHEATH PINNACLE 5F 10CM (SHEATH) ×2 IMPLANT
WIRE EMERALD 3MM-J .035X150CM (WIRE) ×2 IMPLANT

## 2015-06-26 NOTE — Progress Notes (Signed)
Patient is alert and oriented. Reporting no pain. Right groin site WNL with pulses WNL. Reviewed discharge instructions with patient. Will continue to monitor.

## 2015-07-03 ENCOUNTER — Ambulatory Visit: Payer: Medicare Other | Admitting: Pain Medicine

## 2015-07-08 ENCOUNTER — Ambulatory Visit: Payer: Medicare Other | Attending: Pain Medicine | Admitting: Pain Medicine

## 2015-07-08 ENCOUNTER — Encounter: Payer: Self-pay | Admitting: Pain Medicine

## 2015-07-08 VITALS — BP 104/71 | HR 84 | Temp 95.7°F | Resp 18 | Ht 70.0 in | Wt 206.0 lb

## 2015-07-08 DIAGNOSIS — I70209 Unspecified atherosclerosis of native arteries of extremities, unspecified extremity: Secondary | ICD-10-CM

## 2015-07-08 DIAGNOSIS — M533 Sacrococcygeal disorders, not elsewhere classified: Secondary | ICD-10-CM | POA: Diagnosis not present

## 2015-07-08 DIAGNOSIS — M5136 Other intervertebral disc degeneration, lumbar region: Secondary | ICD-10-CM | POA: Insufficient documentation

## 2015-07-08 DIAGNOSIS — M5416 Radiculopathy, lumbar region: Secondary | ICD-10-CM

## 2015-07-08 DIAGNOSIS — Z9889 Other specified postprocedural states: Secondary | ICD-10-CM | POA: Diagnosis not present

## 2015-07-08 DIAGNOSIS — M79606 Pain in leg, unspecified: Secondary | ICD-10-CM | POA: Diagnosis present

## 2015-07-08 DIAGNOSIS — M4806 Spinal stenosis, lumbar region: Secondary | ICD-10-CM | POA: Diagnosis not present

## 2015-07-08 DIAGNOSIS — M51369 Other intervertebral disc degeneration, lumbar region without mention of lumbar back pain or lower extremity pain: Secondary | ICD-10-CM

## 2015-07-08 DIAGNOSIS — M47816 Spondylosis without myelopathy or radiculopathy, lumbar region: Secondary | ICD-10-CM

## 2015-07-08 DIAGNOSIS — E0841 Diabetes mellitus due to underlying condition with diabetic mononeuropathy: Secondary | ICD-10-CM

## 2015-07-08 DIAGNOSIS — M545 Low back pain: Secondary | ICD-10-CM | POA: Diagnosis present

## 2015-07-08 DIAGNOSIS — M48061 Spinal stenosis, lumbar region without neurogenic claudication: Secondary | ICD-10-CM

## 2015-07-08 NOTE — Patient Instructions (Addendum)
PLAN   Continue present medication for now.  F/U PCP C Holland  for evaliation of  BP cardiac condition and general medical  condition as we discussed today  F/U Dr.S Humphrey Rolls as discussed for cardiac evaluation as planned   F/U surgical evaluation. Follow-up with Dr. Sherley Bounds for neurosurgical reevaluation as we previously discussed  F/U neurological evaluation. May consider pending follow-up evaluations  Ask the nurses and secretary the date you are to receive your TENS unit and physical therapy  May consider radiofrequency rhizolysis or intraspinal procedures pending response to present treatment and F/U evaluation   Patient to call Pain Management Center should patient have concerns prior to scheduled return appointment.

## 2015-07-08 NOTE — Progress Notes (Signed)
Safety precautions to be maintained throughout the outpatient stay will include: orient to surroundings, keep bed in low position, maintain call bell within reach at all times, provide assistance with transfer out of bed and ambulation.  

## 2015-07-08 NOTE — Progress Notes (Signed)
   Subjective:    Patient ID: DASHEL NULF, male    DOB: 1965-04-14, 50 y.o.   MRN: [id number]  HPI  The patient is a 50 year old gentleman who returns to pain management for further evaluation and treatment of pain involving the lower back and lower extremity region. At the present time we will avoid interventional treatment to patient. Patient is with significant cardiac condition. The patient is undergone evaluation by Dr. Sherley Bounds and by Dr. Yancey Flemings and we will avoid interventional treatment due to patient's general medical condition. The patient has been recommended for TENS unit prescribed for patient at this time. The patient is call pain management should they be change in condition or should patient have other concerns regarding condition prior to scheduled return appointment. No medications were prescribed for patient on today's visit. All agreed to suggested treatment plan. Patient will be observed in terms of response to TENS unit at this time.  Review of Systems     Objective:   Physical Exam  There was tenderness to palpation of paraspinal musculature region cervical region cervical facet region palpation which reproduces mild discomfort with mild tenderness of the splenius capitis and occipitalis regions with mild tenderness over the region of the thoracic facet thoracic paraspinal muscular region is well. No crepitus of the thoracic region was noted. The patient appeared to be with bilaterally equal grip strength with Tinel and Phalen's maneuver reproducing minimal discomfort. There was tends to palpation over the lumbar paraspinal musculatures and lumbar facet region with tenderness over the PSIS and PII S regions as well. There was minimal tenderness of the greater trochanteric region iliotibial band region. Straight leg raise was tolerates approximately 30 without increased pain with dorsiflexion noted. DTRs were difficult to elicit patient had difficulty relaxing. No definite  sensory deficit or dermatomal dystrophy detected.There was negative clonus negative Homans. Abdomen nontender with no costovertebral tenderness noted      Assessment & Plan:       Degenerative disc disease lumbar spine  Status post lumbar surgery 4  Lumbar facet syndrome  Lumbar stenosis with neurogenic claudication  Sacroiliac joint dysfunction     PLAN   Continue present medication for now.  F/U PCP C Holland  for evaliation of  BP cardiac condition and general medical  condition as we discussed today  F/U Dr.S Humphrey Rolls as discussed for cardiac evaluation as planned   F/U surgical evaluation. Follow-up with Dr. Sherley Bounds for neurosurgical reevaluation as we previously discussed  F/U neurological evaluation. May consider pending follow-up evaluations  Ask the nurses and secretary the date you are to receive your TENS unit and physical therapy  May consider radiofrequency rhizolysis or intraspinal procedures pending response to present treatment and F/U evaluation   Patient to call Pain Management Center should patient have concerns prior to scheduled return appointment.

## 2015-07-22 ENCOUNTER — Ambulatory Visit: Payer: Medicare Other | Attending: Nurse Practitioner | Admitting: Physical Therapy

## 2015-07-22 ENCOUNTER — Encounter: Payer: Self-pay | Admitting: Physical Therapy

## 2015-07-22 DIAGNOSIS — R262 Difficulty in walking, not elsewhere classified: Secondary | ICD-10-CM | POA: Insufficient documentation

## 2015-07-22 DIAGNOSIS — M6281 Muscle weakness (generalized): Secondary | ICD-10-CM | POA: Diagnosis present

## 2015-07-22 DIAGNOSIS — M5441 Lumbago with sciatica, right side: Secondary | ICD-10-CM | POA: Diagnosis present

## 2015-07-22 DIAGNOSIS — M5442 Lumbago with sciatica, left side: Secondary | ICD-10-CM | POA: Diagnosis present

## 2015-07-22 NOTE — Therapy (Signed)
[facility] [address], [place], [address] Phone: [phone]   Fax:  [phone]  Physical Therapy Evaluation  Patient Details  Name: Jose Carroll MRN: [id number] Date of Birth: 12-04-1965 Referring Provider: Janann August, NP  Encounter Date: 07/22/2015      PT End of Session - 07/22/15 1452    Visit Number 1   Number of Visits 12   Authorization Type 1   Authorization Time Period 10 (G-code)   PT Start Time 0916   PT Stop Time 1015   PT Time Calculation (min) 59 min   Activity Tolerance Patient tolerated treatment well   Behavior During Therapy [facility] for tasks assessed/performed      Past Medical History  Diagnosis Date  . Diabetes mellitus without complication ([place])   . COPD (chronic obstructive pulmonary disease) ([place])   . Shortness of breath   . Arthritis   . Hypertension     dr Humphrey Rolls   [place]  . GERD (gastroesophageal reflux disease)   . Anginal pain ([place])   . Coronary artery disease     Past Surgical History  Procedure Laterality Date  . Back surgery      x3  . Finger surgery      sixth finger removed  . Hernia repair    . Cardiac catheterization N/A 09/10/2014    Procedure: Left Heart Cath and Coronary Angiogram ;  Surgeon: Dionisio David, MD;  Location: [facility];  Service: Cardiovascular;  Laterality: N/A;  . Fetal surgery for congenital hernia  2012  . Cardiac catheterization Left 06/26/2015    Procedure: Left Heart Cath and Coronary Angiography;  Surgeon: Dionisio David, MD;  Location: [facility];  Service: Cardiovascular;  Laterality: Left;    There were no vitals filed for this visit.       Subjective Assessment - 07/22/15 0937    Subjective Patient reports increased low back pain (more on R vs L) with radiating symptoms down the posterior aspect of the leg into the lateral aspect of the ankle. Patient states he is able to walk 3 blocks  before having increased pain and losing LE sensation; after sitting for 5 minutes he is able to continue without difficulty. Reports going for a peripheral vascular test in the near future. Patient reports increased pain with walking, bending, lifting and prolonged sitting; decreased pain with heat and anti-inflammatory treatment. Patient mentions no pain with sneezing or coughing.  Patient states deceased sensation along foot secondary to diabetic neuropathy. Worst pain patient presents is 8/10 and best pain is a 1/10   Pertinent History Pt reports 24 years of low back pain stating improved pain after fusion surgery L3-L4  in 2015; Lumabr fusion between L3-S1; Recent exacerbation of symptoms 2 months prior. 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 30 minutes    Patient Stated Goals To decrease pain to better be able to take care of his 3 year old daughter   Currently in Pain? Yes   Pain Score 4    Pain Location Back   Pain Orientation Right;Left   Pain Descriptors / Indicators Aching;Shooting;Stabbing   Pain Type Chronic pain;Acute pain   Pain Radiating Towards Lateral ankle   Pain Onset More than a month ago   Pain Frequency Constant  [facility] PT Assessment - 07/22/15 1439    Assessment   Medical Diagnosis Chronic low back pain M54.5   Referring Provider Janann August, NP   Onset Date/Surgical Date 01/11/14   Hand Dominance Left   Next MD Visit unknown   Prior Therapy None   Precautions   Precautions None   Restrictions   Weight Bearing Restrictions No   Balance Screen   Has the patient fallen in the past 6 months No   Has the patient had a decrease in activity level because of a fear of falling?  No   Is the patient reluctant to leave their home because of a fear of falling?  No   Prior Function   Level of Independence Independent   Vocation On disability    Leisure Playing with daughter        Objective: Observation: Decreased ambulation speed with widened BOS; increased bilateral edema throughout both feet and ankles Palpation: Increased pain along the lumbar extensors and glutes bilaterally; minor spasms noted throughout  Measurement:  Lumbar AROM/MMT: Flexion: 75% limited increased pain, Extension: 25%-- decreased pain, Left lateral flexion: 50% --minor increase in pain, Right lateral flexion: 50% -- drastic increase in pain , Right rotation: WNL increase in pain, Left rotation: WNL R Hip AROM/MMT: Flexion:25% limited 4-/5, Extension:4-/5, ABD:4-/5 -- increased pain,  ER: 4-/5 -- increased pain L Hip AROM/MMT: Flexion:25% limited 5/5, Extension: 5/5 limited , ABD: 4-/5 limited, ER: 4-/5 R Knee AROM/MMT: Flexion: 4-/5 , Extension: 4-/5 L Knee AROM/MMT: Flexion:5/5, Extension:5/5 R Ankle: Dorsiflexion: 4-/5; L Ankle Dorsiflexion: 5/5   Reflexes: L4: Absent on R, S1: Absent on R Sensory light touch: diminished L5,S1 on the right   Special Testing: Positive: SLR for back pain Negative:  SLUMP Prone on elbows: 5 min decreased pain and spasms  Outcome Measures:  MODI: 66%  Therapeutic Exercise: Patient performed exercises with guidance, verbal and tactile cues and demonstration of therapist: Sidelying Clamshells -- x10 Ball squeeze/glute squeeze -- x10 Hip Abduction in sitting with the band -- x10 Prone hip extension in prone -- x10  Patient response to treatment: Decreased pain and spasms by 25% after performing extension based mobility. Decreased pain with extension based activity indicating possible disc involvement and poor motor control.           PT Education - 07/22/15 1450    Education provided Yes   Education Details HEP: Hip abduction in sititng, Ball squeeze/glute squeeze, clamshells, prone hip extension   Person(s) Educated Patient   Methods Explanation;Demonstration;Handout   Comprehension Verbalized  understanding;Returned demonstration             PT Long Term Goals - 07/22/15 1504    PT LONG TERM GOAL #1   Title Pt will score <50% on the MODI by 09/02/15 to demonstrate significant improvement in lumbar function and improved ability to perform bending activities without onset of pain.   Baseline MODI: 66%   Status New   PT LONG TERM GOAL #2   Title Pt will be able to ascend stairs without onset of pain by 09/02/15 to demonstrate significant improvement in LE and lumbar function and improve functional capacity.   Baseline Unable to perform stairs without significant onset of pain   Status New   PT LONG TERM GOAL #3   Title Pt will be independent with HEP focused on improving lumbar and LE strength by 09/02/15 to continue benefits of treatments after discharge from physical therapy   Baseline Dependent with exercise  performance and progression.    Status New               Plan - 07/22/15 1454    Clinical Impression Statement Pt is a 50 yo left hand dominant male presenting with increased low back pain and generalized LE weakness. Patient demonstrates increased low back dysfunction as demonstrated by increased MODI scores, increased active SLR, and pain with walking and performing low back movements. Patient demonstrates decreased strength, endurance and coordination with lumbar movement and will benefit from further skilled therapy to return to prior level of function   Rehab Potential Good   Clinical Impairments Affecting Rehab Potential (+) Positive family support, highly motivated (-) Comorbities   PT Frequency 2x / week   PT Duration 6 weeks   PT Treatment/Interventions Moist Heat;Iontophoresis 4mg /ml Dexamethasone;Cryotherapy;Electrical Stimulation;Manual techniques;Therapeutic exercise;Therapeutic activities;Stair training;Ultrasound;Traction;Neuromuscular re-education;Patient/family education;Passive range of motion;Aquatic Therapy   PT Next Visit Plan Improve lumbar  stabilization and functional activities   Consulted and Agree with Plan of Care Patient      Patient will benefit from skilled therapeutic intervention in order to improve the following deficits and impairments:  Impaired sensation, Decreased strength, Decreased mobility, Decreased balance, Hypermobility, Pain, Difficulty walking, Decreased range of motion, Abnormal gait, Increased muscle spasms, Decreased endurance  Visit Diagnosis: Difficulty in walking, not elsewhere classified - Plan: PT plan of care cert/re-cert  Muscle weakness (generalized) - Plan: PT plan of care cert/re-cert  Bilateral low back pain with sciatica, sciatica laterality unspecified - Plan: PT plan of care cert/re-cert      G-Codes - [id number]    Functional Assessment Tool Used modified oswestry, pain scale, ROM, strength deficits, clinical judgment   Functional Limitation Mobility: Walking and moving around   Mobility: Walking and Moving Around Current Status [id number]) At least 40 percent but less than 60 percent impaired, limited or restricted   Mobility: Walking and Moving Around Goal Status [id number]) At least 20 percent but less than 40 percent impaired, limited or restricted       Problem List Patient Active Problem List   Diagnosis Date Noted  . Chest pain 04/29/2015  . DDD (degenerative disc disease), lumbar 03/06/2015  . Status post lumbar surgery 03/06/2015  . Lumbar radiculopathy 03/06/2015  . Spinal stenosis of lumbar region 03/06/2015  . Facet syndrome, lumbar 03/06/2015  . Sacroiliac joint dysfunction 03/06/2015  . S/P lumbar spinal fusion 02/14/2013    Blythe Stanford, SPT 07/22/2015, 5:35 PM  [facility] [address] [place], [address] Phone: [phone]   Fax:  [phone]  Name: Jose Carroll MRN: [id number] Date of Birth: 27-May-1965

## 2015-07-24 ENCOUNTER — Encounter: Payer: Medicare Other | Admitting: Physical Therapy

## 2015-07-28 ENCOUNTER — Encounter: Payer: Medicare Other | Admitting: Physical Therapy

## 2015-07-29 ENCOUNTER — Ambulatory Visit: Payer: Medicare Other | Admitting: Physical Therapy

## 2015-07-31 ENCOUNTER — Encounter: Payer: Medicare Other | Admitting: Physical Therapy

## 2015-08-01 ENCOUNTER — Encounter: Payer: Medicare Other | Admitting: Physical Therapy

## 2015-08-04 ENCOUNTER — Encounter: Payer: Medicare Other | Admitting: Physical Therapy

## 2015-08-05 ENCOUNTER — Ambulatory Visit: Payer: Medicare Other | Attending: Pain Medicine | Admitting: Pain Medicine

## 2015-08-05 ENCOUNTER — Encounter: Payer: Self-pay | Admitting: Pain Medicine

## 2015-08-05 VITALS — BP 146/81 | HR 75 | Temp 97.5°F | Resp 16 | Ht 70.0 in | Wt 206.0 lb

## 2015-08-05 DIAGNOSIS — M47816 Spondylosis without myelopathy or radiculopathy, lumbar region: Secondary | ICD-10-CM

## 2015-08-05 DIAGNOSIS — M5416 Radiculopathy, lumbar region: Secondary | ICD-10-CM

## 2015-08-05 DIAGNOSIS — M5136 Other intervertebral disc degeneration, lumbar region: Secondary | ICD-10-CM | POA: Insufficient documentation

## 2015-08-05 DIAGNOSIS — M4806 Spinal stenosis, lumbar region: Secondary | ICD-10-CM | POA: Insufficient documentation

## 2015-08-05 DIAGNOSIS — M545 Low back pain: Secondary | ICD-10-CM | POA: Diagnosis present

## 2015-08-05 DIAGNOSIS — M79606 Pain in leg, unspecified: Secondary | ICD-10-CM | POA: Diagnosis present

## 2015-08-05 DIAGNOSIS — M62830 Muscle spasm of back: Secondary | ICD-10-CM | POA: Diagnosis not present

## 2015-08-05 DIAGNOSIS — M48061 Spinal stenosis, lumbar region without neurogenic claudication: Secondary | ICD-10-CM

## 2015-08-05 DIAGNOSIS — Z981 Arthrodesis status: Secondary | ICD-10-CM

## 2015-08-05 DIAGNOSIS — Z9889 Other specified postprocedural states: Secondary | ICD-10-CM | POA: Insufficient documentation

## 2015-08-05 DIAGNOSIS — M533 Sacrococcygeal disorders, not elsewhere classified: Secondary | ICD-10-CM | POA: Diagnosis not present

## 2015-08-05 NOTE — Progress Notes (Signed)
      The patient is a 50 year old gentleman who returns to pain management for further evaluation and treatment of pain involving the region of the lower back and lower extremity region predominantly with pain of the neck and upper extremity region of lesser degree. Present time patient continues to undergo evaluation treatment of his cardiac condition. The patient states his been improvement of pain involving the lower extremity regions since he has had modification of medications by his cardiologist. The patient also continues Lyrica. We informed patient that we will remain available to consider additional modifications of treatment regimen including modification of medications as discussed. We also discussed patient's use of TENS unit and patient is without evidence evidence of contraindications to the use of TENS unit. The patient will proceed with obtaining TENS unit as discussed. All agreed to suggested treatment plan. The patient will call pain management should there be significant change in condition prior to scheduled return appointment     Physical examination  There was tenderness of the paraspinal misreading cervical region cervical facet region a mild to moderate degree with mild to moderate tenderness over the splenius capitis and occipitalis region. Palpation of the acromioclavicular and glenohumeral joint regions reproduces minimal to mild discomfort. There was unremarkable Spurling's maneuver and patient appeared to be with bilaterally equal grip strength without increase of pain with Tinel and Phalen's maneuver. Palpation of the thoracic region was with moderate muscle spasm involving the lower thoracic region without crepitus of the thoracic region noted. The patient appeared to be with unremarkable Spurling maneuver as well. Lumbar region was attends to palpation of moderate degree with lateral bending rotation extension and palpation of the lumbar facets reproducing moderate  discomfort. Palpation over the PSIS and PII S region reproduced mild to moderate discomfort. The patient was with decreased straight leg raising tolerate to 20 without increased pain with dorsiflexion noted. DTRs appeared to be trace at the knees the knees were without increased warmth erythema. There was crepitus of the knees with negative anterior and posterior drawer signs without ballottement of the patella. EHL strength appeared to be slightly decreased without a definite sensory deficit or dermatomal distribution detected. There was negative clonus negative Homans. Abdomen nontender with no costovertebral angle tenderness noted.      Assessment    Degenerative disc disease lumbar spine  Status post lumbar surgery 4  Lumbar facet syndrome  Lumbar stenosis with neurogenic claudication  Sacroiliac joint dysfunction     .PLAN   Continue present medication for now.  F/U PCP C Holland  for evaliation of  BP cardiac condition and general medical  condition as we discussed   F/U Dr.S Humphrey Rolls as discussed for cardiac evaluation as planned   F/U surgical evaluation. Follow-up with Dr. Sherley Bounds for neurosurgical reevaluation as we previously discussed  F/U neurological evaluation. May consider pending follow-up evaluations as discussed  Ask the nurses and secretary the date you are to receive your TENS unit from physical therapy  May consider radiofrequency rhizolysis or intraspinal procedures pending response to present treatment and F/U evaluation   Patient to call Pain Management Center should patient have concerns prior to scheduled return appointment.

## 2015-08-05 NOTE — Patient Instructions (Addendum)
PLAN   Continue present medication for now.  F/U PCP C Holland  for evaliation of  BP cardiac condition and general medical  condition as we discussed today  F/U Dr.S Humphrey Rolls as discussed for cardiac evaluation as planned   F/U surgical evaluation. Follow-up with Dr. Sherley Bounds for neurosurgical reevaluation as we previously discussed  F/U neurological evaluation. May consider pending follow-up evaluations as discussed  Ask the nurses and secretary the date you are to receive your TENS unit from physical therapy  May consider radiofrequency rhizolysis or intraspinal procedures pending response to present treatment and F/U evaluation   Patient to call Pain Management Center should patient have concerns prior to scheduled return appointment.Pain Management Discharge Instructions  General Discharge Instructions :  If you need to reach your doctor call: Monday-Friday 8:00 am - 4:00 pm at [phone] or toll free [phone].  After clinic hours [phone] to have operator reach doctor.  Bring all of your medication bottles to all your appointments in the pain clinic.  To cancel or reschedule your appointment with Pain Management please remember to call 24 hours in advance to avoid a fee.  Refer to the educational materials which you have been given on: General Risks, I had my Procedure. Discharge Instructions, Post Sedation.  Post Procedure Instructions:  The drugs you were given will stay in your system until tomorrow, so for the next 24 hours you should not drive, make any legal decisions or drink any alcoholic beverages.  You may eat anything you prefer, but it is better to start with liquids then soups and crackers, and gradually work up to solid foods.  Please notify your doctor immediately if you have any unusual bleeding, trouble breathing or pain that is not related to your normal pain.  Depending on the type of procedure that was done, some parts of your body may feel  week and/or numb.  This usually clears up by tonight or the next day.  Walk with the use of an assistive device or accompanied by an adult for the 24 hours.  You may use ice on the affected area for the first 24 hours.  Put ice in a Ziploc bag and cover with a towel and place against area 15 minutes on 15 minutes off.  You may switch to heat after 24 hours.

## 2015-08-07 ENCOUNTER — Ambulatory Visit: Payer: Medicare Other | Admitting: Physical Therapy

## 2015-08-07 ENCOUNTER — Encounter: Payer: Medicare Other | Admitting: Physical Therapy

## 2015-08-12 ENCOUNTER — Ambulatory Visit: Payer: Medicare Other | Attending: Nurse Practitioner | Admitting: Physical Therapy

## 2015-09-03 ENCOUNTER — Encounter: Payer: Self-pay | Admitting: Pain Medicine

## 2015-09-03 ENCOUNTER — Ambulatory Visit: Payer: Medicare Other | Attending: Pain Medicine | Admitting: Pain Medicine

## 2015-09-03 VITALS — BP 109/70 | HR 86 | Temp 97.7°F | Resp 16 | Ht 70.0 in | Wt 215.0 lb

## 2015-09-03 DIAGNOSIS — M533 Sacrococcygeal disorders, not elsewhere classified: Secondary | ICD-10-CM

## 2015-09-03 DIAGNOSIS — M5136 Other intervertebral disc degeneration, lumbar region: Secondary | ICD-10-CM

## 2015-09-03 DIAGNOSIS — M48061 Spinal stenosis, lumbar region without neurogenic claudication: Secondary | ICD-10-CM

## 2015-09-03 DIAGNOSIS — Z981 Arthrodesis status: Secondary | ICD-10-CM

## 2015-09-03 DIAGNOSIS — M47816 Spondylosis without myelopathy or radiculopathy, lumbar region: Secondary | ICD-10-CM

## 2015-09-03 DIAGNOSIS — Z9889 Other specified postprocedural states: Secondary | ICD-10-CM

## 2015-09-03 MED ORDER — HYDROCODONE-ACETAMINOPHEN 5-325MG PREPACK (~~LOC~~: ORAL_TABLET | ORAL | 0 refills | Status: DC

## 2015-09-03 NOTE — Progress Notes (Signed)
The patient is a 50 year old [place] who returns to pain management for further evaluation and treatment of pain involving the region of the neck entire back upper and lower extremity regions. The patient is with significant pain of the lumbar lower extremity region. The patient stated that his band stopped on him and that he had to push is an adult the road. We caution patient regarding such strenuous activity due to patient's general medical condition. On today's visit we discussed patient's condition and decision was made to prescribe hydrocodone acetaminophen for patient. The patient stated that he was having significant pain due to the reduction of Lyrica. The patient's Lyrica was reduced due to side effects such as swelling and other side effects and patient with significant cardiac history. We discussed patient's condition and caution patient regarding the use of hydrocodone acetaminophen. The patient reminded [place] that he wishes to remain alert so that he could care for young child. We will observe response to hydrocodone acetaminophen as well as the use of TENS unit and patient will minimize the use of Lyrica due to undesirable side effects of Lyrica. We remain available to consider additional modifications of treatment regimen pending response to treatment and follow-up evaluation. Patient will follow-up with primary care physician H Boscia to further address his use of Lyrica and general medical condition. All agreed to suggested treatment plan    Physical examination  There was moderate tenderness of the splenius capitis and occipitalis regions as well as the cervical facet cervical paraspinal muscles region. Palpation of the region of the trapezius musculature region the patient is A and rhomboid musculature regions reproduce moderate discomfort. There was moderate tenderness to palpation over the thoracic facet thoracic paraspinal musculature region with no crepitus of the thoracic region  noted. There was tenderness of the acromioclavicular and glenohumeral joint regions with mild difficulty attempted to performed the drop test. The patient appeared to be with bilaterally equal grip strength without significant increase of pain with Tinel and Phalen's maneuver. Palpation over the region of the lumbar region was attends to palpation with lateral bending rotation extension and palpation of the lumbar facets reproducing moderate discomfort. There was moderate tenderness of the PSIS and PII S region. Palpation of the gluteal and piriformis musculature region reproduced moderate discomfort. There was mild tenderness of the greater trochanteric region iliotibial band region. Straight leg raise was tolerates approximately 30 without a definite increase of pain with dorsiflexion noted. No definite sensory deficit of dermatomal distribution was detected. There was negative clonus negative Homans. EHL strength appeared to be slightly decreased. Abdomen nontender with no costovertebral angle tenderness noted.     Assessment   Degenerative disc disease lumbar spine  Status post lumbar surgery 4  Lumbar facet syndrome  Lumbar stenosis with neurogenic claudication  Sacroiliac joint dysfunction     PLAN   Continue present medication for now.. Continue Lyrica and remember as discussed that Lyrica can cause lower extremity swelling as discussed. Please follow-up with PCP as we discussed regarding Lyrica and the reduction of Lyrica dose  BEGIN   hydrocodone acetaminophen  CAUTION hydrocodone acetaminophen and Lyrica and other medications have significant side effects Medication can cause respiratory depression and cause you to stop breathing, cause excessive sedation, cause confusion and other side effects.  Exercise extreme caution when taking medication and call EMS or go to the Emergency Department immediately if you develop any of these symptoms   F/U PCP C Holland  for evaliation  of  BP cardiac condition and general medical  condition . Patient will discuss decreasing Lyrica with his PCP as well due to increased swelling due to Lyrica and other concerns regarding the use of Lyrica  F/U Dr.S Humphrey Rolls as discussed for cardiac evaluation as planned  F/U surgical evaluation. Follow-up with Dr. Sherley Bounds for neurosurgical reevaluation as we previously discussed  F/U neurological evaluation. May consider pending follow-up evaluations as discussed  Use TENS unit with caution to avoid aggravation of symptoms  May consider radiofrequency rhizolysis or intraspinal procedures pending response to present treatment and F/U evaluation   Patient to call Pain Management Center should patient have concerns prior to scheduled return appointment

## 2015-09-03 NOTE — Progress Notes (Signed)
Safety precautions to be maintained throughout the outpatient stay will include: orient to surroundings, keep bed in low position, maintain call bell within reach at all times, provide assistance with transfer out of bed and ambulation.  

## 2015-09-03 NOTE — Patient Instructions (Addendum)
PLAN   Continue present medication for now.. Continue Lyrica and remember as discussed that Lyrica can cause lower extremity swelling as discussed. Please follow-up with PCP as we discussed regarding Lyrica and the reduction of Lyrica dose  BEGIN   hydrocodone acetaminophen  CAUTION hydrocodone acetaminophen and Lyrica and other medications have significant side effects Medication can cause respiratory depression and cause you to stop breathing, cause excessive sedation, cause confusion and other side effects.  Exercise extreme caution when taking medication and call EMS or go to the Emergency Department immediately if you develop any of these symptoms   F/U PCP C Holland  for evaliation of  BP cardiac condition and general medical  condition   F/U Dr.S Humphrey Rolls as discussed for cardiac evaluation as planned  F/U surgical evaluation. Follow-up with Dr. Sherley Bounds for neurosurgical reevaluation as we previously discussed  F/U neurological evaluation. May consider pending follow-up evaluations as discussed  Use TENS unit with caution to avoid aggravation of symptoms  May consider radiofrequency rhizolysis or intraspinal procedures pending response to present treatment and F/U evaluation   Patient to call Pain Management Center should patient have concerns prior to scheduled return appointment  Hydrocodone script handed to patient.

## 2015-09-16 ENCOUNTER — Telehealth: Payer: Self-pay | Admitting: *Deleted

## 2015-10-01 ENCOUNTER — Ambulatory Visit: Payer: Medicare Other | Admitting: Pain Medicine

## 2016-04-27 ENCOUNTER — Encounter: Payer: Medicare Other | Attending: Internal Medicine | Admitting: Internal Medicine

## 2016-04-27 DIAGNOSIS — L97511 Non-pressure chronic ulcer of other part of right foot limited to breakdown of skin: Secondary | ICD-10-CM | POA: Diagnosis not present

## 2016-04-27 DIAGNOSIS — Z7902 Long term (current) use of antithrombotics/antiplatelets: Secondary | ICD-10-CM | POA: Diagnosis not present

## 2016-04-27 DIAGNOSIS — J449 Chronic obstructive pulmonary disease, unspecified: Secondary | ICD-10-CM | POA: Insufficient documentation

## 2016-04-27 DIAGNOSIS — K219 Gastro-esophageal reflux disease without esophagitis: Secondary | ICD-10-CM | POA: Diagnosis not present

## 2016-04-27 DIAGNOSIS — M5416 Radiculopathy, lumbar region: Secondary | ICD-10-CM | POA: Insufficient documentation

## 2016-04-27 DIAGNOSIS — Z880 Allergy status to penicillin: Secondary | ICD-10-CM | POA: Insufficient documentation

## 2016-04-27 DIAGNOSIS — E1142 Type 2 diabetes mellitus with diabetic polyneuropathy: Secondary | ICD-10-CM | POA: Diagnosis not present

## 2016-04-27 DIAGNOSIS — E1151 Type 2 diabetes mellitus with diabetic peripheral angiopathy without gangrene: Secondary | ICD-10-CM | POA: Diagnosis not present

## 2016-04-27 DIAGNOSIS — L97211 Non-pressure chronic ulcer of right calf limited to breakdown of skin: Secondary | ICD-10-CM | POA: Insufficient documentation

## 2016-04-27 DIAGNOSIS — E785 Hyperlipidemia, unspecified: Secondary | ICD-10-CM | POA: Insufficient documentation

## 2016-04-27 DIAGNOSIS — M1990 Unspecified osteoarthritis, unspecified site: Secondary | ICD-10-CM | POA: Insufficient documentation

## 2016-04-27 DIAGNOSIS — F17210 Nicotine dependence, cigarettes, uncomplicated: Secondary | ICD-10-CM | POA: Diagnosis not present

## 2016-04-27 DIAGNOSIS — Z79899 Other long term (current) drug therapy: Secondary | ICD-10-CM | POA: Diagnosis not present

## 2016-04-27 DIAGNOSIS — I10 Essential (primary) hypertension: Secondary | ICD-10-CM | POA: Diagnosis not present

## 2016-04-27 DIAGNOSIS — I2510 Atherosclerotic heart disease of native coronary artery without angina pectoris: Secondary | ICD-10-CM | POA: Insufficient documentation

## 2016-04-28 NOTE — Progress Notes (Signed)
Jose Carroll, Jose Carroll ([id number]) Visit Report for 04/27/2016 Abuse/Suicide Risk Screen Details Jose Carroll Date of Service: 04/27/2016 10:30 AM Patient Name: A. Patient Account Number: [id number] Medical Record Treating RN: Ahmed Prima [id number] Number: Other Clinician: Date of Birth/Sex: [birth year] (51 y.o. Male) Treating ROBSON, [place] Primary Care Johnette Teigen: [name] Maxwell Martorano/Extender: G Referring Tamicka Shimon: [name] Weeks in Treatment: 0 Abuse/Suicide Risk Screen Items Answer ABUSE/SUICIDE RISK SCREEN: Has anyone close to you tried to hurt or harm you recentlyo No Do you feel uncomfortable with anyone in your familyo No Has anyone forced you do things that you didnot want to doo No Do you have any thoughts of harming yourselfo No Patient displays signs or symptoms of abuse and/or neglect. No Electronic Signature(s) Signed: 04/27/2016 4:52:03 PM By: Alric Quan Entered By: Alric Quan on 04/27/2016 11:00:22 Jose Carroll ([id number]) -------------------------------------------------------------------------------- Activities of Daily Living Details Jose Carroll Date of Service: 04/27/2016 10:30 AM Patient Name: A. Patient Account Number: [id number] Medical Record Treating RN: Ahmed Prima [id number] Number: Other Clinician: Date of Birth/Sex: Aug 17, 1965 (51 y.o. Male) Treating ROBSON, [place] Primary Care Troi Florendo: [name] Lillyn Wieczorek/Extender: G Referring Chareese Sergent: [name] Weeks in Treatment: 0 Activities of Daily Living Items Answer Activities of Daily Living (Please select one for each item) Drive Automobile Not Able Take Medications Completely Able Use Telephone Completely Able Care for Appearance Completely Able Use Toilet Completely Able Bath / Shower Completely Able Dress Self Completely Able Feed Self Completely Able Walk Completely Able Get In / Out Bed Completely Able Housework Completely Able Prepare  Meals Completely [place] for Self Completely Able Notes Pt had license taken away unsure of reason. Electronic Signature(s) Signed: 04/27/2016 4:52:03 PM By: Alric Quan Entered By: Alric Quan on 04/27/2016 11:01:06 Jose Carroll ([id number]) -------------------------------------------------------------------------------- Education Assessment Details Jose Carroll Date of Service: 04/27/2016 10:30 AM Patient Name: A. Patient Account Number: [id number] Medical Record Treating RN: Ahmed Prima [id number] Number: Other Clinician: Date of Birth/Sex: 1965/02/07 (51 y.o. Male) Treating ROBSON, MICHAEL Primary Care Carolee Channell: [name] Janequa Kipnis/Extender: G Referring Bryer Cozzolino: Oletta Darter in Treatment: 0 Primary Learner Assessed: Patient Learning Preferences/Education Level/Primary Language Learning Preference: Explanation, Printed Material Highest Education Level: Grade School Preferred Language: English Cognitive Barrier Assessment/Beliefs Language Barrier: No Translator Needed: No Memory Deficit: No Emotional Barrier: No Cultural/Religious Beliefs Affecting Medical No Care: Physical Barrier Assessment Impaired Vision: Yes Glasses Impaired Hearing: Yes HOH Decreased Hand dexterity: No Knowledge/Comprehension Assessment Knowledge Level: Medium Comprehension Level: Medium Ability to understand written Medium instructions: Ability to understand verbal Medium instructions: Motivation Assessment Anxiety Level: Calm Cooperation: Cooperative Education Importance: Acknowledges Need Interest in Health Problems: Asks Questions Perception: Coherent Willingness to Engage in Self- Medium Management Activities: Readiness to Engage in Self- Medium Management Activities: Jose Carroll, Jose Carroll ([id number]) Electronic Signature(s) Signed: 04/27/2016 4:52:03 PM By: Alric Quan Entered By: Alric Quan on  04/27/2016 11:01:32 Jose Carroll ([id number]) -------------------------------------------------------------------------------- Fall Risk Assessment Details Jose Carroll Date of Service: 04/27/2016 10:30 AM Patient Name: A. Patient Account Number: [id number] Medical Record Treating RN: Ahmed Prima [id number] Number: Other Clinician: Date of Birth/Sex: Jul 20, 1965 (51 y.o. Male) Treating ROBSON, MICHAEL Primary Care Orly Quimby: [name] Denishia Citro/Extender: G Referring Shaakira Borrero: [name] Weeks in Treatment: 0 Fall Risk Assessment Items Have you had 2 or more falls in the last 12 monthso 0 Yes Have you had any fall that resulted in injury in the last 12 monthso 0 No FALL RISK ASSESSMENT: History of falling - immediate or within 3 months 0  No Secondary diagnosis 0 No Ambulatory aid None/bed rest/wheelchair/nurse 0 No Crutches/cane/walker 0 No Furniture 0 No IV Access/Saline Lock 0 No Gait/Training Normal/bed rest/immobile 0 No Weak 0 No Impaired 0 No Mental Status Oriented to own ability 0 Yes Electronic Signature(s) Signed: 04/27/2016 4:52:03 PM By: Alric Quan Entered By: Alric Quan on 04/27/2016 11:01:56 Jose Carroll ([id number]) -------------------------------------------------------------------------------- Foot Assessment Details Jose Carroll Date of Service: 04/27/2016 10:30 AM Patient Name: A. Patient Account Number: [id number] Medical Record Treating RN: Ahmed Prima [id number] Number: Other Clinician: Date of Birth/Sex: 11-Jun-1965 (51 y.o. Male) Treating ROBSON, MICHAEL Primary Care Deandrea Vanpelt: [name] Jordie Skalsky/Extender: G Referring Jazzmyne Rasnick: [name] Weeks in Treatment: 0 Foot Assessment Items Site Locations + = Sensation present, - = Sensation absent, C = Callus, U = Ulcer R = Redness, W = Warmth, M = Maceration, PU = Pre-ulcerative lesion F = Fissure, S = Swelling, D = Dryness Assessment Right:  Left: Other Deformity: No No Prior Foot Ulcer: No No Prior Amputation: No No Charcot Joint: No No Ambulatory Status: Ambulatory Without Help Gait: Steady Electronic Signature(s) Signed: 04/27/2016 4:52:03 PM By: Alric Quan Entered By: Alric Quan on 04/27/2016 11:05:13 Jose Carroll ([id number]) -------------------------------------------------------------------------------- Nutrition Risk Assessment Details Jose Carroll Date of Service: 04/27/2016 10:30 AM Patient Name: A. Patient Account Number: [id number] Medical Record Treating RN: Ahmed Prima [id number] Number: Other Clinician: Date of Birth/Sex: 01-02-1966 (51 y.o. Male) Treating ROBSON, MICHAEL Primary Care Wauneta Silveria: [name] Keziyah Kneale/Extender: G Referring Esteen Delpriore: [name] Weeks in Treatment: 0 Height (in): 70 Weight (lbs): 216.3 Body Mass Index (BMI): 31 Nutrition Risk Assessment Items NUTRITION RISK SCREEN: I have an illness or condition that made me change the kind and/or 0 No amount of food I eat I eat fewer than two meals per day 0 No I eat few fruits and vegetables, or milk products 0 No I have three or more drinks of beer, liquor or wine almost every day 0 No I have tooth or mouth problems that make it hard for me to eat 0 No I don't always have enough money to buy the food I need 0 No I eat alone most of the time 0 No I take three or more different prescribed or over-the-counter drugs a 1 Yes day Without wanting to, I have lost or gained 10 pounds in the last six 0 No months I am not always physically able to shop, cook and/or feed myself 0 No Nutrition Protocols Good Risk Protocol Moderate Risk Protocol Electronic Signature(s) Signed: 04/27/2016 4:52:03 PM By: Alric Quan Entered By: Alric Quan on 04/27/2016 11:02:03

## 2016-04-29 NOTE — Progress Notes (Signed)
Jose Carroll ([id number]) Visit Report for 04/27/2016 Chief Complaint Document Details Jose Carroll Date of Service: 04/27/2016 10:30 AM Patient Name: A. Patient Account Number: [id number] Medical Record Treating RN: Ahmed Prima [id number] Number: Other Clinician: Date of Birth/Sex: Aug 02, 1965 (51 y.o. Male) Treating Daishia Fetterly, [place] Primary Care Provider: [name] Provider/Extender: G Referring Provider: [name] Weeks in Treatment: 0 Information Obtained from: Patient Chief Complaint 04/27/16 Patient is here for review of 2 wounds on the right leg and right foot that have been present for two months Electronic Signature(s) Signed: 04/28/2016 7:57:33 AM By: Linton Ham MD Entered By: Linton Ham on 04/27/2016 11:54:00 Jose Carroll ([id number]) -------------------------------------------------------------------------------- Debridement Details Jose Carroll Date of Service: 04/27/2016 10:30 AM Patient Name: A. Patient Account Number: [id number] Medical Record Treating RN: Ahmed Prima [id number] Number: Other Clinician: Date of Birth/Sex: Feb 08, 1965 (51 y.o. Male) Treating Alexzandra Bilton, [place] Primary Care Provider: [name] Provider/Extender: G Referring Provider: [name] Weeks in Treatment: 0 Debridement Performed for Wound #1 Right,Anterior Lower Leg Assessment: Performed By: Physician Ricard Dillon, MD Debridement: Debridement Pre-procedure Yes - 11:24 Verification/Time Out Taken: Start Time: 11:25 Pain Control: Lidocaine 4% Topical Solution Level: Skin/Subcutaneous Tissue Total Area Debrided (L x 1.9 (cm) x 1.4 (cm) = 2.66 (cm) W): Tissue and other Viable, Non-Viable, Exudate, Fibrin/Slough, Subcutaneous material debrided: Instrument: Curette Bleeding: Minimum Hemostasis Achieved: Pressure End Time: 11:26 Procedural Pain: 0 Post Procedural Pain: 0 Response to Treatment: Procedure was tolerated well Post  Debridement Measurements of Total Wound Length: (cm) 1.9 Width: (cm) 1.4 Depth: (cm) 0.1 Volume: (cm) 0.209 Character of Wound/Ulcer Post Requires Further Debridement Debridement: Severity of Tissue Post Debridement: Fat layer exposed Post Procedure Diagnosis Same as Pre-procedure Electronic Signature(s) Signed: 04/27/2016 4:52:03 PM By: Alric Quan Signed: 04/28/2016 7:57:33 AM By: Linton Ham MD Jose Carroll ([id number]) Entered By: Alric Quan on 04/27/2016 11:26:39 Jose Carroll ([id number]) -------------------------------------------------------------------------------- Debridement Details Jose Carroll Date of Service: 04/27/2016 10:30 AM Patient Name: A. Patient Account Number: [id number] Medical Record Treating RN: Ahmed Prima [id number] Number: Other Clinician: Date of Birth/Sex: 1965-12-30 (51 y.o. Male) Treating Irvin Bastin, [place] Primary Care Provider: [name] Provider/Extender: G Referring Provider: [name] Weeks in Treatment: 0 Debridement Performed for Wound #2 Right,Lateral Foot Assessment: Performed By: Physician Ricard Dillon, MD Debridement: Debridement Pre-procedure Yes - 11:24 Verification/Time Out Taken: Start Time: 11:27 Pain Control: Lidocaine 4% Topical Solution Level: Skin/Subcutaneous Tissue Total Area Debrided (L x 0.5 (cm) x 1 (cm) = 0.5 (cm) W): Tissue and other Viable, Non-Viable, Exudate, Fibrin/Slough, Subcutaneous material debrided: Instrument: Curette Bleeding: Minimum Hemostasis Achieved: Pressure End Time: 11:28 Procedural Pain: 0 Post Procedural Pain: 0 Response to Treatment: Procedure was tolerated well Post Debridement Measurements of Total Wound Length: (cm) 0.5 Width: (cm) 1 Depth: (cm) 0.1 Volume: (cm) 0.039 Character of Wound/Ulcer Post Requires Further Debridement Debridement: Severity of Tissue Post Debridement: Fat layer exposed Post Procedure Diagnosis Same as  Pre-procedure Electronic Signature(s) Signed: 04/27/2016 4:52:03 PM By: Alric Quan Signed: 04/28/2016 7:57:33 AM By: Linton Ham MD Jose Carroll ([id number]) Entered By: Alric Quan on 04/27/2016 11:27:19 Jose Carroll ([id number]) -------------------------------------------------------------------------------- HPI Details Jose Carroll Date of Service: 04/27/2016 10:30 AM Patient Name: A. Patient Account Number: [id number] Medical Record Treating RN: Ahmed Prima [id number] Number: Other Clinician: Date of Birth/Sex: 08/09/1965 (51 y.o. Male) Treating Linton Ham Primary Care Provider: [name] Provider/Extender: G Referring Provider: [name] Weeks in Treatment: 0 History of Present Illness HPI Description: 04/27/16; The is a 51 yo type 2  diabetic who still smokes. He tells me that he has known PAD followed by Dr. Chancy Milroy at [facility]. He thinks he had arterial studies by Dr Chancy Milroy (osp) less than a year ago. He tells me he had less than a block claudication at one point but is now on a combination of asa, Plavix and Zontivity and now has a 1/2 mile tolerance. 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 Approx 1 month ago he noted 2 open wounds on his right leg and foot . No obvious trauma. He has not been doing anything specific to the wounds. "leaving open to air". He denies a hx of CAD although this appears on his problem list. He was given a course of clindamycin which he has completed His ABI in this clinic is 0.97. No major wound history although he has small healed areas on the right leg Electronic Signature(s) Signed: 04/28/2016 7:57:33 AM By: Linton Ham MD Entered By: Linton Ham on 04/27/2016 12:18:49 Jose Carroll ([id number]) -------------------------------------------------------------------------------- Physical Exam Details Jose Carroll Date of  Service: 04/27/2016 10:30 AM Patient Name: A. Patient Account Number: [id number] Medical Record Treating RN: Ahmed Prima [id number] Number: Other Clinician: Date of Birth/Sex: 04/16/1965 (52 y.o. Male) Treating Taaj Hurlbut Primary Care Provider: [name] Provider/Extender: G Referring Provider: [name] Weeks in Treatment: 0 Constitutional Sitting or standing Blood Pressure is within target range for patient.. Pulse regular and within target range for patient.Marland Kitchen Respirations regular, non-labored and within target range.. Temperature is normal and within the target range for the patient.. Patient's appearance is neat and clean. Appears in no acute distress. Well nourished and well developed.. Eyes Conjunctivae clear. No discharge.. Ears, Nose, Mouth, and Throat Patient is hard of hearing.. Patient is a nodule as. Respiratory Respiratory effort is easy and symmetric bilaterally. Rate is normal at rest and on room air.. Shallow air entry no crackles or wheezes current work of breathing is normal. Cardiovascular Heart rhythm and rate regular, without murmur or gallop.. Palpable but reduced. Also palpable at the popliteal but reduced. I can feel a right posterior tibial, not a dorsalis pedis is delayed and the foot is cool. Edema present in both extremities. 2+ bilaterally and pitting. No evidence of significant venous or lymphatic disease. Lymphatic None palpable in the right popliteal or inguinal area. Neurological Decreased light touch. Psychiatric No evidence of depression, anxiety, or agitation. Calm, cooperative, and communicative. Appropriate interactions and affect.. Notes Wound exam; the patient has 2 wounds one on the right anterior leg covered in a necrotic surface. Using a #3 curet nonviable tissue was removed from the surface of this wound. He cleans up quite nicely hemostasis with direct pressure oWound on the right medial foot in a similar state also  debrided with a #3 curet. This is a smaller wound oNo evidence of infection in either area Electronic Signature(s) Signed: 04/28/2016 7:57:33 AM By: Linton Ham MD Entered By: Linton Ham on 04/27/2016 12:49:28 Jose Carroll, Jose Carroll ([id number]) Jose Carroll, Jose Carroll ([id number]) -------------------------------------------------------------------------------- Physician Orders Details Jose Carroll Date of Service: 04/27/2016 10:30 AM Patient Name: A. Patient Account Number: [id number] Medical Record Treating RN: Ahmed Prima [id number] Number: Other Clinician: Date of Birth/Sex: December 06, 1965 (51 y.o. Male) Treating Erven Ramson, Regino Ramirez Primary Care Provider: [name] Provider/Extender: G Referring Provider: [name] Weeks in Treatment: 0 Verbal / Phone Orders: Yes Clinician: Pinkerton, Debi Read Back and Verified: Yes Diagnosis Coding Wound Cleansing Wound #1 Right,Anterior Lower Leg o Clean wound with Normal Saline.   o Cleanse wound with mild soap and water Wound #2 Right,Lateral Foot o Clean wound with Normal Saline. o Cleanse wound with mild soap and water Anesthetic Wound #1 Right,Anterior Lower Leg o Topical Lidocaine 4% cream applied to wound bed prior to debridement Wound #2 Right,Lateral Foot o Topical Lidocaine 4% cream applied to wound bed prior to debridement Primary Wound Dressing Wound #1 Right,Anterior Lower Leg o Prisma Ag Wound #2 Right,Lateral Foot o Prisma Ag Secondary Dressing Wound #1 Right,Anterior Lower Leg o ABD pad o Dry Gauze Wound #2 Right,Lateral Foot o ABD pad o Dry Gauze Dressing Change Frequency Wound #1 Right,Anterior Lower Leg NIVIN, BRANIFF A. ([id number]) o Change dressing every week Wound #2 Right,Lateral Foot o Change dressing every week Follow-up Appointments Wound #1 Right,Anterior Lower Leg o Return Appointment in 1 week. Wound #2 Right,Lateral Foot o Return Appointment in 1  week. Edema Control Wound #1 Right,Anterior Lower Leg o Kerlix and Coban - Right Lower Extremity - unna to anchor o Elevate legs to the level of the heart and pump ankles as often as possible Wound #2 Right,Lateral Foot o Kerlix and Coban - Right Lower Extremity - unna to anchor o Elevate legs to the level of the heart and pump ankles as often as possible Additional Orders / Instructions Wound #1 Right,Anterior Lower Leg o Stop Smoking o Increase protein intake. Wound #2 Right,Lateral Foot o Stop Smoking o Increase protein intake. Medications-please add to medication list. Wound #1 Right,Anterior Lower Leg o Other: - vitamin c, zinc, MVI Wound #2 Right,Lateral Foot o Other: - vitamin c, zinc, MVI Electronic Signature(s) Signed: 04/27/2016 4:52:03 PM By: Alric Quan Signed: 04/28/2016 7:57:33 AM By: Linton Ham MD Entered By: Alric Quan on 04/27/2016 11:34:09 Jose Carroll ([id number]) -------------------------------------------------------------------------------- Problem List Details Jose Carroll Date of Service: 04/27/2016 10:30 AM Patient Name: A. Patient Account Number: [id number] Medical Record Treating RN: Ahmed Prima [id number] Number: Other Clinician: Date of Birth/Sex: Apr 26, 1965 (51 y.o. 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 Patient Account Number: [id number] Medical Record Treating RN: Ahmed Prima [id number] Number: Other Clinician: Date of Birth/Sex: 1965/08/21 (50 y.o. Male) Treating Linton Ham Primary Care Provider: [name] Provider/Extender: G Referring Provider: [name] Weeks in Treatment: 0 Subjective Chief Complaint Information obtained from Patient 04/27/16 Patient is here for review of 2 wounds on the right leg and right foot that have been present for two months History of Present Illness (HPI) 04/27/16; The is a 51 yo type 2 diabetic who still smokes. He tells me that he has known PAD followed by Dr. Chancy Milroy at [facility]. He thinks he had arterial studies by Dr Chancy Milroy (osp) less than a year ago. He tells me he had less than a block claudication at one point but is now on a combination of asa, Plavix and Zontivity and  now has a 1/2 mile tolerance. 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 Approx 1 month ago he noted 2 open wounds on his right leg and foot . No obvious trauma. He has not been doing anything specific to the wounds. "leaving open to air". He denies a hx of CAD although this appears on his problem list. He was given a course of clindamycin which he has completed His ABI in this clinic is 0.97. No major wound history although he has small healed areas on the right leg Wound History Patient presents with 2 open wounds that have been present for approximately 1 months. Patient has been treating wounds in the following manner: nothing. Laboratory tests have not been  performed in the last month. Patient reportedly has not tested positive for an antibiotic resistant organism. Patient reportedly has not tested positive for osteomyelitis. Patient reportedly has not had testing performed to evaluate circulation in the legs. Patient experiences the following problems associated with their wounds: swelling. Patient History Information obtained from Patient. Allergies amoxicillin, naproxen Family History Cancer - Mother, Diabetes - Mother, Father, Heart Disease - Mother, Hypertension - Mother, Stroke - Mother. Jose Carroll, Jose Carroll ([id number]) Social History Current some day smoker - 3 cigarettes a day, Marital Status - Married, Alcohol Use - Never, Drug Use - No History, Caffeine Use - Daily. Medical History Respiratory Patient has history of Chronic Obstructive Pulmonary Disease (COPD) Cardiovascular Patient has history of Angina, Coronary Artery Disease, Hypertension, Peripheral Venous Disease Endocrine Patient has history of Type II Diabetes Musculoskeletal Patient has history of Osteoarthritis Neurologic Patient has history of Neuropathy Patient is treated with Insulin, Oral Agents. Blood sugar is tested. Review of Systems (ROS) Constitutional Symptoms (General Health) The patient has no complaints or symptoms. Eyes Complains or has symptoms of Glasses / Contacts. Ear/Nose/Mouth/Throat The patient has no complaints or symptoms, HOH Hematologic/Lymphatic The patient has no complaints or symptoms. Respiratory Complains or has symptoms of Shortness of Breath - with exertion. Cardiovascular hyperlipidemia Gastrointestinal acid reflux Immunological The patient has no complaints or symptoms. Integumentary (Skin) Complains or has symptoms of Wounds. Oncologic The patient has no complaints or symptoms. Psychiatric The patient has no complaints or symptoms. Objective Jose Carroll, Jose Carroll ([id number]) Constitutional Sitting or standing Blood  Pressure is within target range for patient.. Pulse regular and within target range for patient.Marland Kitchen Respirations regular, non-labored and within target range.. Temperature is normal and within the target range for the patient.. Patient's appearance is neat and clean. Appears in no acute distress. Well nourished and well developed.. Vitals Time Taken: 10:49 AM, Height: 70 in, Source: Stated, Weight: 216.3 lbs, Source: Measured, BMI: 31, Temperature: 97.5 F, Pulse: 85 bpm, Respiratory Rate: 18 breaths/min, Blood Pressure: 131/76 mmHg. Eyes Conjunctivae clear. No discharge.. Ears, Nose, Mouth, and Throat Patient is hard of hearing.. Patient is a nodule as. Respiratory Respiratory effort is easy and symmetric bilaterally. Rate is normal at rest and on room air.. Shallow air entry no crackles or wheezes current work of breathing is normal. Cardiovascular Heart rhythm and rate regular, without murmur or gallop.. Palpable but reduced. Also palpable at the popliteal but reduced. I can feel a right posterior tibial, not a dorsalis pedis is delayed and the foot is cool. Edema present in both extremities. 2+ bilaterally and pitting. No evidence of significant venous or lymphatic disease. Lymphatic None palpable in the right popliteal or inguinal area. Neurological Decreased light touch. Psychiatric No evidence of depression, anxiety, or  agitation. Calm, cooperative, and communicative. Appropriate interactions and affect.. General Notes: Wound exam; the patient has 2 wounds one on the right anterior leg covered in a necrotic surface. Using a #3 curet nonviable tissue was removed from the surface of this wound. He cleans up quite nicely hemostasis with direct pressure Wound on the right medial foot in a similar state also debrided with a #3 curet. This is a smaller wound No evidence of infection in either area Integumentary (Hair, Skin) Wound #1 status is Open. Original cause of wound was Gradually  Appeared. The wound is located on the Right,Anterior Lower Leg. The wound measures 1.9cm length x 1.4cm width x 0.1cm depth; 2.089cm^2 area and 0.209cm^3 volume. There is no tunneling or undermining noted. There is a small amount of serosanguineous drainage noted. The wound margin is distinct with the outline attached to the wound base. There is no granulation within the wound bed. There is a large (67-100%) amount of necrotic tissue within the wound bed including Eschar. The periwound skin appearance exhibited: Dry/Scaly, Erythema. The surrounding wound skin color is noted with erythema which is circumferential. Periwound temperature was Jose Carroll, Jose A. ([id number]) noted as No Abnormality. The periwound has tenderness on palpation. Wound #2 status is Open. Original cause of wound was Gradually Appeared. The wound is located on the Right,Medial Foot. The wound measures 0.5cm length x 1cm width x 0.1cm depth; 0.393cm^2 area and 0.039cm^3 volume. The wound is limited to skin breakdown. There is no tunneling or undermining noted. There is a small amount of serosanguineous drainage noted. The wound margin is distinct with the outline attached to the wound base. There is no granulation within the wound bed. There is a large (67-100%) amount of necrotic tissue within the wound bed including Eschar. The periwound skin appearance exhibited: Dry/Scaly, Erythema. The surrounding wound skin color is noted with erythema which is circumferential. Periwound temperature was noted as No Abnormality. The periwound has tenderness on palpation. 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 There was a Skin/Subcutaneous Tissue Debridement ([id number]) debridement with total area of 2.66 sq cm performed by Ricard Dillon, MD. with the following instrument(s): Curette to remove Viable and Non-Viable tissue/material including Exudate, Fibrin/Slough, and Subcutaneous after achieving pain control using Lidocaine 4% Topical Solution. A time out was conducted at 11:24, prior to the start of the procedure. A Minimum amount of bleeding was controlled with Pressure. The procedure was tolerated well with a pain level of 0 throughout and a pain level of 0 following the procedure. Post Debridement Measurements: 1.9cm length x 1.4cm width x 0.1cm depth; 0.209cm^3 volume. Character of Wound/Ulcer Post Debridement requires further debridement. Severity of Tissue Post Debridement is: Fat layer exposed. Post procedure Diagnosis Wound #1: Same as Pre-Procedure Wound #2 Wound #2 is a Diabetic Wound/Ulcer of the Lower Extremity located on the Right,Lateral Foot . There was a Skin/Subcutaneous Tissue Debridement ([id number]) debridement with total area of 0.5 sq cm performed by Ricard Dillon, MD. with the following instrument(s): Curette to remove Viable and Non-Viable Jose Carroll, Jose A. ([id number]) tissue/material including Exudate, Fibrin/Slough, and Subcutaneous after achieving  pain control using Lidocaine 4% Topical Solution. A time out was conducted at 11:24, prior to the start of the procedure. A Minimum amount of bleeding was controlled with Pressure. The procedure was tolerated well with a pain level of 0 throughout and a pain level of 0 following the procedure. Post Debridement Measurements: 0.5cm length x 1cm width x 0.1cm depth; 0.039cm^3 volume. Character of Wound/Ulcer Post Debridement requires further debridement. Severity of Tissue  Post Debridement is: Fat layer exposed. Post procedure Diagnosis Wound #2: Same as Pre-Procedure Plan Wound Cleansing: Wound #1 Right,Anterior Lower Leg: Clean wound with Normal Saline. Cleanse wound with mild soap and water Wound #2 Right,Lateral Foot: Clean wound with Normal Saline. Cleanse wound with mild soap and water Anesthetic: Wound #1 Right,Anterior Lower Leg: Topical Lidocaine 4% cream applied to wound bed prior to debridement Wound #2 Right,Lateral Foot: Topical Lidocaine 4% cream applied to wound bed prior to debridement Primary Wound Dressing: Wound #1 Right,Anterior Lower Leg: Prisma Ag Wound #2 Right,Lateral Foot: Prisma Ag Secondary Dressing: Wound #1 Right,Anterior Lower Leg: ABD pad Dry Gauze Wound #2 Right,Lateral Foot: ABD pad Dry Gauze Dressing Change Frequency: Wound #1 Right,Anterior Lower Leg: Change dressing every week Wound #2 Right,Lateral Foot: Change dressing every week Follow-up Appointments: Wound #1 Right,Anterior Lower Leg: Return Appointment in 1 week. Wound #2 Right,Lateral Foot: Return Appointment in 1 week. Jose Carroll, Jose Carroll ([id number]) Edema Control: Wound #1 Right,Anterior Lower Leg: Kerlix and Coban - Right Lower Extremity - unna to anchor Elevate legs to the level of the heart and pump ankles as often as possible Wound #2 Right,Lateral Foot: Kerlix and Coban - Right Lower Extremity - unna to anchor Elevate legs to the level of the heart and pump ankles as often as possible Additional Orders / Instructions: Wound #1 Right,Anterior Lower Leg: Stop Smoking Increase protein intake. Wound #2 Right,Lateral Foot: Stop Smoking Increase protein intake. Medications-please add to medication list.: Wound #1 Right,Anterior Lower Leg: Other: - vitamin c, zinc, MVI Wound #2 Right,Lateral Foot: Other: - vitamin c, zinc, MVI #1 diabetic wounds on the right leg and right foot in the setting of PAD. #2 from the patient's discussion  which is vague it would appear that he has had arterial studies through his cardiologist Dr. Chancy Milroy. We'll see if we can get these results. He is on an aggressive antiplatelet regimen for PAD ando Coronary artery disease although the patient denies he has any heart issues #3 I elected to wrap him in a Kerlix Coban wrap to control some of the edema in his leg Electronic Signature(s) Signed: 04/27/2016 12:50:36 PM By: Linton Ham MD Entered By: Linton Ham on 04/27/2016 12:50:36 Jose Carroll ([id number]) -------------------------------------------------------------------------------- ROS/PFSH Details Jose Carroll Date of Service: 04/27/2016 10:30 AM Patient Name: A. Patient Account Number: [id number] Medical Record Treating RN: Ahmed Prima [id number] Number: Other Clinician: Date of Birth/Sex: 11-17-1965 (51 y.o. Male) Treating Percival Glasheen, [place] Primary Care Provider: [name] Provider/Extender: G Referring Provider: [name] Weeks in Treatment: 0 Information Obtained From Patient Wound History Do you currently have one or more open woundso Yes How many open wounds do you currently haveo 2 Approximately how long have you had your woundso 1 months How have you been treating your wound(s) until nowo nothing Has your wound(s) ever healed and then re-openedo No Have you had any lab work done in the past montho No Have you tested positive for an antibiotic resistant organism (MRSA, VRE)o No Have you tested positive for osteomyelitis (bone infection)o No Have  you had any tests for circulation on your legso No Have you had other problems associated with your woundso Swelling Eyes Complaints and Symptoms: Positive for: Glasses / Contacts Respiratory Complaints and Symptoms: Positive for: Shortness of Breath - with exertion Medical History: Positive for: Chronic Obstructive Pulmonary Disease (COPD) Integumentary (Skin) Complaints and Symptoms: Positive  for: Wounds Constitutional Symptoms (General Health) Complaints and Symptoms: No Complaints or Symptoms Ear/Nose/Mouth/Throat Jose Carroll, Jose Carroll ([id number]) Complaints and Symptoms: No Complaints or Symptoms Complaints and Symptoms: Review of System Notes: HOH Hematologic/Lymphatic Complaints and Symptoms: No Complaints or Symptoms Cardiovascular Complaints and Symptoms: Review of System Notes: hyperlipidemia Medical History: Positive for: Angina; Coronary Artery Disease; Hypertension; Peripheral Venous Disease Gastrointestinal Complaints and Symptoms: Review of System Notes: acid reflux Endocrine Medical History: Positive for: Type II Diabetes Time with diabetes: 3-4 yrs Treated with: Insulin, Oral agents Blood sugar tested every day: Yes Tested : 2 x's a day Immunological Complaints and Symptoms: No Complaints or Symptoms Musculoskeletal Medical History: Positive for: Osteoarthritis Neurologic Medical History: Positive for: Neuropathy Oncologic Jose Carroll, Jose Carroll ([id number]) Complaints and Symptoms: No Complaints or Symptoms Psychiatric Complaints and Symptoms: No Complaints or Symptoms Immunizations Pneumococcal Vaccine: Received Pneumococcal Vaccination: Yes Family and Social History Cancer: Yes - Mother; Diabetes: Yes - Mother, Father; Heart Disease: Yes - Mother; Hypertension: Yes - Mother; Stroke: Yes - Mother; Current some day smoker - 3 cigarettes a day; Marital Status - Married; Alcohol Use: Never; Drug Use: No History; Caffeine Use: Daily; Financial Concerns: No; Food, Clothing or Shelter Needs: No; Support System Lacking: No; Transportation Concerns: No; Advanced Directives: No; Patient does not want information on Advanced Directives; Do not resuscitate: No; Living Will: No; Medical Power of Attorney: No Electronic Signature(s) Signed: 04/27/2016 4:52:03 PM By: Alric Quan Signed: 04/28/2016 7:57:33 AM By: Linton Ham MD Entered By:  Alric Quan on 04/27/2016 11:00:12 Jose Carroll ([id number]) -------------------------------------------------------------------------------- SuperBill Details Jose Carroll Date of Service: 04/27/2016 Patient Name: A. Patient Account Number: [id number] Medical Record Treating RN: Ahmed Prima [id number] Number: Other Clinician: Date of Birth/Sex: 1965/06/27 (51 y.o. 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 E11.42 Type 2 diabetes mellitus with diabetic polyneuropathy Facility Procedures CPT4: Description Modifier Quantity Code 48185631 99213 - WOUND CARE VISIT-LEV 3 EST PT 1 CPT4: 49702637 11042 - DEB SUBQ TISSUE 20 SQ CM/< 1 ICD-10 Description Diagnosis 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 Physician Procedures CPT4: Description Modifier Quantity Code 8588502 WC PHYS LEVEL 3 o NEW PT 25 1 ICD-10 Description Diagnosis E11.51 Type 2 diabetes mellitus with diabetic peripheral angiopathy without gangrene L97.211 Non-pressure chronic ulcer of right calf limited to  breakdown of skin L97.511 Non-pressure chronic ulcer of other part of right foot limited to breakdown of skin CPT4: 7741287 11042 - WC PHYS SUBQ TISS 20 SQ CM 1 ICD-10 Description Diagnosis Jose Carroll, HARKLEROAD ([id number]) Electronic Signature(s) Signed: 04/27/2016 4:52:03 PM By: Alric Quan Signed: 04/28/2016 7:57:33 AM By: Linton Ham MD Entered By: Alric Quan on  04/27/2016 13:22:04

## 2016-04-29 NOTE — Progress Notes (Signed)
RHET, RORKE ([id number]) Visit Report for 04/27/2016 Allergy List Details Patient Name: Jose Carroll, Jose Carroll. Date of Service: 04/27/2016 10:30 AM Medical Record Number: [id number] Patient Account Number: [id number] Date of Birth/Sex: February 18, 1965 (51 y.o. Male) Treating RN: Ahmed Prima Primary Care Marteze Vecchio: Jose Carroll Other Clinician: Referring Joseantonio Dittmar: Jose Carroll Treating Ima Hafner/Extender: Ricard Dillon Weeks in Treatment: 0 Allergies Active Allergies amoxicillin naproxen Allergy Notes Electronic Signature(s) Signed: 04/27/2016 4:52:03 PM By: Alric Quan Entered By: Alric Quan on 04/27/2016 10:51:58 [name] ([id number]) -------------------------------------------------------------------------------- Arrival Information Details Patient Name: Jose Osgood A. Date of Service: 04/27/2016 10:30 AM Medical Record Number: [id number] Patient Account Number: [id number] Date of Birth/Sex: 06/20/1965 (51 y.o. Male) Treating RN: Ahmed Prima Primary Care Jiaire Rosebrook: Jose Carroll Other Clinician: Referring Kian Ottaviano: Jose Carroll Treating Jiyaan Steinhauser/Extender: Tito Dine in Treatment: 0 Visit Information Patient Arrived: Ambulatory Arrival Time: 10:43 Accompanied By: self Transfer Assistance: None Patient Identification Verified: Yes Secondary Verification Process Yes Completed: Patient Requires Transmission-Based No Precautions: Patient Has Alerts: Yes Patient Alerts: DM II Electronic Signature(s) Signed: 04/27/2016 4:52:03 PM By: Alric Quan Entered By: Alric Quan on 04/27/2016 10:48:59 [name] ([id number]) -------------------------------------------------------------------------------- Clinic Level of Care Assessment Details Patient Name: [name]. Date of Service: 04/27/2016 10:30 AM Medical Record Number: [id number] Patient Account Number: [id number] Date of Birth/Sex: 1965-01-25 (52  y.o. Male) Treating RN: Ahmed Prima Primary Care Zannie Runkle: Jose Carroll Other Clinician: Referring Jessilyn Catino: Jose Carroll Treating Areej Tayler/Extender: Ricard Dillon Weeks in Treatment: 0 Clinic Level of Care Assessment Items TOOL 1 Quantity Score X - Use when EandM and Procedure is performed on INITIAL visit 1 0 ASSESSMENTS - Nursing Assessment / Reassessment X - General Physical Exam (combine w/ comprehensive assessment (listed just 1 20 below) when performed on new pt. evals) X - Comprehensive Assessment (HX, ROS, Risk Assessments, Wounds Hx, etc.) 1 25 ASSESSMENTS - Wound and Skin Assessment / Reassessment []  - Dermatologic / Skin Assessment (not related to wound area) 0 ASSESSMENTS - Ostomy and/or Continence Assessment and Care []  - Incontinence Assessment and Management 0 []  - Ostomy Care Assessment and Management (repouching, etc.) 0 PROCESS - Coordination of Care []  - Simple Patient / Family Education for ongoing care 0 X - Complex (extensive) Patient / Family Education for ongoing care 1 20 X - Staff obtains Programmer, systems, Records, Test Results / Process Orders 1 10 []  - Staff telephones HHA, Nursing Homes / Clarify orders / etc 0 []  - Routine Transfer to another Facility (non-emergent condition) 0 []  - Routine Hospital Admission (non-emergent condition) 0 X - New Admissions / Biomedical engineer / Ordering NPWT, Apligraf, etc. 1 15 []  - Emergency Hospital Admission (emergent condition) 0 PROCESS - Special Needs []  - Pediatric / Minor Patient Management 0 []  - Isolation Patient Management 0 GERSHON, SHORTEN ([id number]) []  - Hearing / Language / Visual special needs 0 []  - Assessment of Community assistance (transportation, D/C planning, etc.) 0 []  - Additional assistance / Altered mentation 0 []  - Support Surface(s) Assessment (bed, cushion, seat, etc.) 0 INTERVENTIONS - Miscellaneous []  - External ear exam 0 []  - Patient Transfer (multiple staff / Librarian, academic / Similar devices) 0 []  - Simple Staple / Suture removal (25 or less) 0 []  - Complex Staple / Suture removal (26 or more) 0 []  - Hypo/Hyperglycemic Management (do not check if billed separately) 0 X - Ankle / Brachial Index (ABI) - do not check if billed separately 1 15 Has the patient been seen at the hospital  within the last three years: Yes Total Score: 105 Level Of Care: New/Established - Level 3 Electronic Signature(s) Signed: 04/27/2016 4:52:03 PM By: Alric Quan Entered By: Alric Quan on 04/27/2016 13:21:55 [name] ([id number]) -------------------------------------------------------------------------------- Encounter Discharge Information Details Patient Name: Jose Osgood A. Date of Service: 04/27/2016 10:30 AM Medical Record Number: [id number] Patient Account Number: [id number] Date of Birth/Sex: 19-Nov-1965 (51 y.o. Male) Treating RN: Ahmed Prima Primary Care Myrian Botello: Jose Carroll Other Clinician: Referring Zarayah Lanting: Jose Carroll Treating Aydee Mcnew/Extender: Ricard Dillon Weeks in Treatment: 0 Encounter Discharge Information Items Discharge Pain Level: 0 Discharge Condition: Stable Ambulatory Status: Ambulatory Discharge Destination: Home Transportation: Private Auto Accompanied By: SELF Schedule Follow-up Appointment: Yes Medication Reconciliation completed and provided to Patient/Care No Ameshia Pewitt: Provided on Clinical Summary of Care: 04/27/2016 Form Type Recipient Paper Patient GJ Electronic Signature(s) Signed: 04/27/2016 11:46:39 AM By: Ruthine Dose Entered By: Ruthine Dose on 04/27/2016 11:46:38 [name] ([id number]) -------------------------------------------------------------------------------- Lower Extremity Assessment Details Patient Name: [name]. Date of Service: 04/27/2016 10:30 AM Medical Record Number: [id number] Patient Account Number: [id number] Date of Birth/Sex: May 31, 1965 (51 y.o.  Male) Treating RN: Ahmed Prima Primary Care Tyreka Henneke: Jose Carroll Other Clinician: Referring Zarinah Oviatt: Jose Carroll Treating Trinette Vera/Extender: Ricard Dillon Weeks in Treatment: 0 Edema Assessment Assessed: [Left: No] [Right: No] E[Left: dema] [Right: :] Calf Left: Right: Point of Measurement: 32 cm From Medial Instep cm 38.9 cm Ankle Left: Right: Point of Measurement: 10 cm From Medial Instep cm 25.1 cm Vascular Assessment Pulses: Dorsalis Pedis Palpable: [Right:No] Posterior Tibial Extremity colors, hair growth, and conditions: Extremity Color: [Right:Normal] Temperature of Extremity: [Right:Cool] Capillary Refill: [Right:> 3 seconds] Blood Pressure: Brachial: [Right:124] Dorsalis Pedis: [Left:Dorsalis Pedis: 80] Ankle: Posterior Tibial: [Left:Posterior Tibial: 120] [Right:0.97] Toe Nail Assessment Left: Right: Thick: Yes Discolored: Yes Deformed: No Improper Length and Hygiene: Yes Electronic Signature(s) Signed: 04/27/2016 4:52:03 PM By: Delbert Phenix ([id number]) Entered By: Alric Quan on 04/27/2016 11:13:43 [name] ([id number]) -------------------------------------------------------------------------------- Multi Wound Chart Details Patient Name: Jose Osgood A. Date of Service: 04/27/2016 10:30 AM Medical Record Number: [id number] Patient Account Number: [id number] Date of Birth/Sex: 1965/10/18 (51 y.o. Male) Treating RN: Ahmed Prima Primary Care Anaysia Germer: Jose Carroll Other Clinician: Referring Severin Bou: Jose Carroll Treating Sharie Amorin/Extender: Ricard Dillon Weeks in Treatment: 0 Vital Signs Height(in): 70 Pulse(bpm): 85 Weight(lbs): 216.3 Blood Pressure 131/76 (mmHg): Body Mass Index(BMI): 31 Temperature(F): 97.5 Respiratory Rate 18 (breaths/min): Photos: [1:No Photos] [2:No Photos] [N/A:N/A] Wound Location: [1:Right Lower Leg - Anterior Right Foot - Lateral]  [N/A:N/A] Wounding Event: [1:Gradually Appeared] [2:Gradually Appeared] [N/A:N/A] Primary Etiology: [1:Diabetic Wound/Ulcer of Diabetic Wound/Ulcer of the Lower Extremity] [2:the Lower Extremity] [N/A:N/A] Comorbid History: [1:Chronic Obstructive Pulmonary Disease (COPD), Angina, Coronary Artery Disease, Coronary Artery Disease, Hypertension, Peripheral Hypertension, Peripheral Venous Disease, Type II Venous Disease, Type II Diabetes, Osteoarthritis,  Diabetes, Osteoarthritis, Neuropathy] [2:Chronic Obstructive Pulmonary Disease (COPD), Angina, Neuropathy] [N/A:N/A] Date Acquired: [1:04/06/2016] [2:04/06/2016] [N/A:N/A] Weeks of Treatment: [1:0] [2:0] [N/A:N/A] Wound Status: [1:Open] [2:Open] [N/A:N/A] Measurements L x W x D 1.9x1.4x0.1 [2:0.5x1x0.1] [N/A:N/A] (cm) Area (cm) : [1:2.089] [2:0.393] [N/A:N/A] Volume (cm) : [1:0.209] [2:0.039] [N/A:N/A] % Reduction in Area: [1:N/A] [2:0.00%] [N/A:N/A] % Reduction in Volume: N/A [2:0.00%] [N/A:N/A] Classification: [1:Grade 1] [2:Grade 1] [N/A:N/A] Exudate Amount: [1:Small] [2:Small] [N/A:N/A] Exudate Type: [1:Serosanguineous] [2:Serosanguineous] [N/A:N/A] Exudate Color: [1:red, brown] [2:red, brown] [N/A:N/A] Wound Margin: [1:Distinct, outline attached Distinct, outline attached] [N/A:N/A] Granulation Amount: [1:None Present (0%)] [2:None Present (0%)] [N/A:N/A] Necrotic Amount: [1:Large (67-100%)] [2:Large (67-100%)] [N/A:N/A] Necrotic Tissue: [1:Eschar] [2:Eschar] [  N/A:N/A] Epithelialization: None None N/A Periwound Skin Texture: No Abnormalities Noted No Abnormalities Noted N/A Periwound Skin Dry/Scaly: Yes Dry/Scaly: Yes N/A Moisture: Periwound Skin Color: Erythema: Yes Erythema: Yes N/A Erythema Location: Circumferential Circumferential N/A Temperature: No Abnormality No Abnormality N/A Tenderness on Yes Yes N/A Palpation: Wound Preparation: Ulcer Cleansing: Ulcer Cleansing: N/A Rinsed/Irrigated with Rinsed/Irrigated with Saline  Saline Topical Anesthetic Topical Anesthetic Applied: Other: lidocaine Applied: Other: lidocaine 4% 4% Treatment Notes Electronic Signature(s) Signed: 04/27/2016 4:52:03 PM By: Alric Quan Entered By: Alric Quan on 04/27/2016 11:25:11 [name] ([id number]) -------------------------------------------------------------------------------- [place] Details Patient Name: [name] Date of Service: 04/27/2016 10:30 AM Medical Record Number: [id number] Patient Account Number: [id number] Date of Birth/Sex: 11-17-1965 (51 y.o. Male) Treating RN: Ahmed Prima Primary Care Alayzia Pavlock: Jose Carroll Other Clinician: Referring Langston Tuberville: Jose Carroll Treating Mahlik Lenn/Extender: Ricard Dillon Weeks in Treatment: 0 Active Inactive ` Nutrition Nursing Diagnoses: Imbalanced nutrition Impaired glucose control: actual or potential Goals: Patient/caregiver agrees to and verbalizes understanding of need to use nutritional supplements and/or vitamins as prescribed Date Initiated: 04/27/2016 Target Resolution Date: 07/17/2016 Goal Status: Active Interventions: Assess patient nutrition upon admission and as needed per policy Provide education on elevated blood sugars and impact on wound healing Notes: ` Orientation to the Wound Care Program Nursing Diagnoses: Knowledge deficit related to the wound healing center program Goals: Patient/caregiver will verbalize understanding of the [place] [facility] Date Initiated: 04/27/2016 Target Resolution Date: 05/15/2016 Goal Status: Active Interventions: Provide education on orientation to the wound center Notes: ` Wound/Skin Impairment DWYANE, DUPREE ([id number]) Nursing Diagnoses: Impaired tissue integrity Knowledge deficit related to smoking impact on wound healing Knowledge deficit related to ulceration/compromised skin integrity Goals: Ulcer/skin breakdown will have a volume  reduction of 80% by week 12 Date Initiated: 04/27/2016 Target Resolution Date: 07/10/2016 Goal Status: Active Interventions: Assess patient/caregiver ability to perform ulcer/skin care regimen upon admission and as needed Assess ulceration(s) every visit Provide education on smoking Provide education on ulcer and skin care Notes: Electronic Signature(s) Signed: 04/27/2016 4:52:03 PM By: Alric Quan Entered By: Alric Quan on 04/27/2016 11:24:46 [name] ([id number]) -------------------------------------------------------------------------------- Pain Assessment Details Patient Name: Jose Osgood A. Date of Service: 04/27/2016 10:30 AM Medical Record Number: [id number] Patient Account Number: [id number] Date of Birth/Sex: 1965-08-04 (51 y.o. Male) Treating RN: Ahmed Prima Primary Care Dulcie Gammon: Jose Carroll Other Clinician: Referring Mandel Seiden: Jose Carroll Treating Laddie Math/Extender: Ricard Dillon Weeks in Treatment: 0 Active Problems Location of Pain Severity and Description of Pain Patient Has Paino Yes Site Locations Pain Location: Pain in Ulcers With Dressing Change: No Rate the pain. Current Pain Level: 5 Character of Pain Describe the Pain: Burning Pain Management and Medication Current Pain Management: Electronic Signature(s) Signed: 04/27/2016 4:52:03 PM By: Alric Quan Entered By: Alric Quan on 04/27/2016 10:49:29 [name] ([id number]) -------------------------------------------------------------------------------- Patient/Caregiver Education Details Jose Osgood Date of Service: 04/27/2016 10:30 AM Patient Name: A. Patient Account Number: [id number] Medical Record Treating RN: Ahmed Prima [id number] Number: Other Clinician: Date of Birth/Gender: 1966-01-10 (51 y.o. Male) Treating Linton Ham Primary Care Physician: Jose Carroll Physician/Extender: G Referring Physician: Oletta Darter in Treatment: 0 Education Assessment Education Provided To: Patient Education Topics Provided Elevated Blood Sugar/ Impact on Healing: Handouts: Elevated Blood Sugars: How Do They Affect Wound Healing Methods: Explain/Verbal Responses: State content correctly Smoking and Wound Healing: Handouts: Smoking and Wound Healing Methods: Explain/Verbal, Printed Responses: State content correctly Welcome To The [place]: Handouts: Welcome To The Wound Care  Center Methods: Explain/Verbal Responses: State content correctly Wound/Skin Impairment: Handouts: Other: do not get wrap wet Methods: Demonstration, Explain/Verbal Responses: State content correctly Electronic Signature(s) Signed: 04/27/2016 4:52:03 PM By: Alric Quan Entered By: Alric Quan on 04/27/2016 11:35:45 [name] ([id number]) -------------------------------------------------------------------------------- Wound Assessment Details Patient Name: Jose Osgood A. Date of Service: 04/27/2016 10:30 AM Medical Record Number: [id number] Patient Account Number: [id number] Date of Birth/Sex: December 19, 1965 (51 y.o. Male) Treating RN: Carolyne Fiscal, Debi Primary Care Lanson Randle: Jose Carroll Other Clinician: Referring Ajene Carchi: Jose Carroll Treating Tajay Muzzy/Extender: Ricard Dillon Weeks in Treatment: 0 Wound Status Wound Number: 1 Primary Diabetic Wound/Ulcer of the Lower Etiology: Extremity Wound Location: Right Lower Leg - Anterior Wound Open Wounding Event: Gradually Appeared Status: Date Acquired: 04/06/2016 Comorbid Chronic Obstructive Pulmonary Disease Weeks Of Treatment: 0 History: (COPD), Angina, Coronary Artery Clustered Wound: No Disease, Hypertension, Peripheral Venous Disease, Type II Diabetes, Osteoarthritis, Neuropathy Photos Photo Uploaded By: Alric Quan on 04/27/2016 16:43:16 Wound Measurements Length: (cm) 1.9 Width: (cm) 1.4 Depth: (cm) 0.1 Area:  (cm) 2.089 Volume: (cm) 0.209 % Reduction in Area: % Reduction in Volume: Epithelialization: None Tunneling: No Undermining: No Wound Description Classification: Grade 1 Foul Odor Aft Wound Margin: Distinct, outline attached Slough/Fibrin Exudate Amount: Small Exudate Type: Serosanguineous Exudate Color: red, brown er Cleansing: No o No Wound Bed Granulation Amount: None Present (0%) Necrotic Amount: Large (67-100%) SHONTEZ, SERMON A. ([id number]) Necrotic Quality: Eschar Periwound Skin Texture Texture Color No Abnormalities Noted: No No Abnormalities Noted: No Erythema: Yes Moisture Erythema Location: Circumferential No Abnormalities Noted: No Dry / Scaly: Yes Temperature / Pain Temperature: No Abnormality Tenderness on Palpation: Yes Wound Preparation Ulcer Cleansing: Rinsed/Irrigated with Saline Topical Anesthetic Applied: Other: lidocaine 4%, Treatment Notes Wound #1 (Right, Anterior Lower Leg) 1. Cleansed with: Clean wound with Normal Saline 2. Anesthetic Topical Lidocaine 4% cream to wound bed prior to debridement 4. Dressing Applied: Prisma Ag 5. Secondary Dressing Applied ABD Pad Dry Gauze 7. Secured with Tape Notes Mayford Knife TO ANCHOR Electronic Signature(s) Signed: 04/27/2016 4:52:03 PM By: Alric Quan Entered By: Alric Quan on 04/27/2016 11:16:52 [name] ([id number]) -------------------------------------------------------------------------------- Wound Assessment Details Patient Name: [name]. Date of Service: 04/27/2016 10:30 AM Medical Record Number: [id number] Patient Account Number: [id number] Date of Birth/Sex: 09/07/1965 (51 y.o. Male) Treating RN: Carolyne Fiscal, Debi Primary Care Amaris Garrette: Jose Carroll Other Clinician: Referring Lauralye Kinn: Jose Carroll Treating Diarra Ceja/Extender: Ricard Dillon Weeks in Treatment: 0 Wound Status Wound Number: 2 Primary Diabetic Wound/Ulcer of the  Lower Etiology: Extremity Wound Location: Right Foot - Lateral Wound Open Wounding Event: Gradually Appeared Status: Date Acquired: 04/06/2016 Comorbid Chronic Obstructive Pulmonary Disease Weeks Of Treatment: 0 History: (COPD), Angina, Coronary Artery Clustered Wound: No Disease, Hypertension, Peripheral Venous Disease, Type II Diabetes, Osteoarthritis, Neuropathy Photos Photo Uploaded By: Alric Quan on 04/27/2016 16:43:17 Wound Measurements Length: (cm) 0.5 Width: (cm) 1 Depth: (cm) 0.1 Area: (cm) 0.393 Volume: (cm) 0.039 % Reduction in Area: 0% % Reduction in Volume: 0% Epithelialization: None Tunneling: No Undermining: No Wound Description Classification: Grade 1 Foul Odor Aft Wound Margin: Distinct, outline attached Slough/Fibrin Exudate Amount: Small Exudate Type: Serosanguineous Exudate Color: red, brown er Cleansing: No o No Wound Bed Granulation Amount: None Present (0%) Exposed Structure Necrotic Amount: Large (67-100%) Fascia Exposed: No ISAIR, INABINET A. ([id number]) Necrotic Quality: Eschar Fat Layer (Subcutaneous Tissue) Exposed: No Tendon Exposed: No Muscle Exposed: No Joint Exposed: No Bone Exposed: No Limited to Skin Breakdown Periwound Skin Texture Texture Color No Abnormalities Noted: No No  Abnormalities Noted: No Erythema: Yes Moisture Erythema Location: Circumferential No Abnormalities Noted: No Dry / Scaly: Yes Temperature / Pain Temperature: No Abnormality Tenderness on Palpation: Yes Wound Preparation Ulcer Cleansing: Rinsed/Irrigated with Saline Topical Anesthetic Applied: Other: lidocaine 4%, Electronic Signature(s) Signed: 04/27/2016 4:52:03 PM By: Alric Quan Entered By: Alric Quan on 04/27/2016 11:17:37 [name] ([id number]) -------------------------------------------------------------------------------- Vitals Details Patient Name: [name] Date of Service: 04/27/2016 10:30  AM Medical Record Number: [id number] Patient Account Number: [id number] Date of Birth/Sex: 1965-12-14 (51 y.o. Male) Treating RN: Carolyne Fiscal, Debi Primary Care Alexcia Schools: Jose Carroll Other Clinician: Referring Masoud Nyce: Jose Carroll Treating Aniko Finnigan/Extender: Ricard Dillon Weeks in Treatment: 0 Vital Signs Time Taken: 10:49 Temperature (F): 97.5 Height (in): 70 Pulse (bpm): 85 Source: Stated Respiratory Rate (breaths/min): 18 Weight (lbs): 216.3 Blood Pressure (mmHg): 131/76 Source: Measured Reference Range: 80 - 120 mg / dl Body Mass Index (BMI): 31 Electronic Signature(s) Signed: 04/27/2016 4:52:03 PM By: Alric Quan Entered By: Alric Quan on 04/27/2016 10:50:55

## 2016-05-04 ENCOUNTER — Encounter: Payer: Medicare Other | Admitting: Internal Medicine

## 2016-05-04 DIAGNOSIS — L97211 Non-pressure chronic ulcer of right calf limited to breakdown of skin: Secondary | ICD-10-CM | POA: Diagnosis not present

## 2016-05-05 NOTE — Progress Notes (Addendum)
ROHAIL, KLEES ([id number]) Visit Report for 05/04/2016 Arrival Information Details Patient Name: Jose Carroll, Jose Carroll. Date of Service: 05/04/2016 3:30 PM Medical Record Number: [id number] Patient Account Number: [id number] Date of Birth/Sex: December 12, 1965 (51 y.o. Male) Treating RN: Ahmed Prima Primary Care Ashari Llewellyn: [name] Other Clinician: Referring Gertrude Tarbet: [name] Treating Bailynn Dyk/Extender: Ricard Dillon Weeks in Treatment: 1 Visit Information History Since Last Visit All ordered tests and consults were completed: No Patient Arrived: Ambulatory Added or deleted any medications: No Arrival Time: 15:03 Any new allergies or adverse reactions: No Accompanied By: self Had a fall or experienced change in No Transfer Assistance: None activities of daily living that may affect Patient Identification Verified: Yes risk of falls: Secondary Verification Process Yes Signs or symptoms of abuse/neglect since last No Completed: visito Patient Requires Transmission-Based No Hospitalized since last visit: No Precautions: Has Dressing in Place as Prescribed: Yes Patient Has Alerts: Yes Has Compression in Place as Prescribed: Yes Patient Alerts: DM II Pain Present Now: No Electronic Signature(s) Signed: 05/04/2016 4:19:56 PM By: Alric Quan Entered By: Alric Quan on 05/04/2016 15:08:35 Jose Carroll ([id number]) -------------------------------------------------------------------------------- Clinic Level of Care Assessment Details Patient Name: Jose Carroll. Date of Service: 05/04/2016 3:30 PM Medical Record Number: [id number] Patient Account Number: [id number] Date of Birth/Sex: September 27, 1965 (51 y.o. Male) Treating RN: Carolyne Fiscal, Debi Primary Care Skyanne Welle: [name] Other Clinician: Referring Carmesha Morocco: [name] Treating Tillmon Kisling/Extender: Tito Dine in Treatment: 1 Clinic Level of Care Assessment Items TOOL 4  Quantity Score X - Use when only an EandM is performed on FOLLOW-UP visit 1 0 ASSESSMENTS - Nursing Assessment / Reassessment X - Reassessment of Co-morbidities (includes updates in patient status) 1 10 X - Reassessment of Adherence to Treatment Plan 1 5 ASSESSMENTS - Wound and Skin Assessment / Reassessment []  - Simple Wound Assessment / Reassessment - one wound 0 X - Complex Wound Assessment / Reassessment - multiple wounds 2 5 []  - Dermatologic / Skin Assessment (not related to wound area) 0 ASSESSMENTS - Focused Assessment X - Circumferential Edema Measurements - multi extremities 1 5 []  - Nutritional Assessment / Counseling / Intervention 0 []  - Lower Extremity Assessment (monofilament, tuning fork, pulses) 0 []  - Peripheral Arterial Disease Assessment (using hand held doppler) 0 ASSESSMENTS - Ostomy and/or Continence Assessment and Care []  - Incontinence Assessment and Management 0 []  - Ostomy Care Assessment and Management (repouching, etc.) 0 PROCESS - Coordination of Care X - Simple Patient / Family Education for ongoing care 1 15 []  - Complex (extensive) Patient / Family Education for ongoing care 0 []  - Staff obtains Programmer, systems, Records, Test Results / Process Orders 0 []  - Staff telephones HHA, Nursing Homes / Clarify orders / etc 0 []  - Routine Transfer to another Facility (non-emergent condition) 0 KLEBER, CREAN ([id number]) []  - Routine Hospital Admission (non-emergent condition) 0 []  - New Admissions / Biomedical engineer / Ordering NPWT, Apligraf, etc. 0 []  - Emergency Hospital Admission (emergent condition) 0 X - Simple Discharge Coordination 1 10 []  - Complex (extensive) Discharge Coordination 0 PROCESS - Special Needs []  - Pediatric / Minor Patient Management 0 []  - Isolation Patient Management 0 []  - Hearing / Language / Visual special needs 0 []  - Assessment of Community assistance (transportation, D/C planning, etc.) 0 []  - Additional assistance /  Altered mentation 0 []  - Support Surface(s) Assessment (bed, cushion, seat, etc.) 0 INTERVENTIONS - Wound Cleansing / Measurement []  - Simple Wound Cleansing - one wound 0 X -  Complex Wound Cleansing - multiple wounds 2 5 X - Wound Imaging (photographs - any number of wounds) 1 5 []  - Wound Tracing (instead of photographs) 0 []  - Simple Wound Measurement - one wound 0 X - Complex Wound Measurement - multiple wounds 2 5 INTERVENTIONS - Wound Dressings []  - Small Wound Dressing one or multiple wounds 0 []  - Medium Wound Dressing one or multiple wounds 0 X - Large Wound Dressing one or multiple wounds 1 20 X - Application of Medications - topical 1 5 []  - Application of Medications - injection 0 INTERVENTIONS - Miscellaneous []  - External ear exam 0 Jose Carroll, Jose A. ([id number]) []  - Specimen Collection (cultures, biopsies, blood, body fluids, etc.) 0 []  - Specimen(s) / Culture(s) sent or taken to Lab for analysis 0 []  - Patient Transfer (multiple staff / Harrel Lemon Lift / Similar devices) 0 []  - Simple Staple / Suture removal (25 or less) 0 []  - Complex Staple / Suture removal (26 or more) 0 []  - Hypo / Hyperglycemic Management (close monitor of Blood Glucose) 0 []  - Ankle / Brachial Index (ABI) - do not check if billed separately 0 X - Vital Signs 1 5 Has the patient been seen at the hospital within the last three years: Yes Total Score: 110 Level Of Care: New/Established - Level 3 Electronic Signature(s) Signed: 05/04/2016 4:19:56 PM By: Alric Quan Entered By: Alric Quan on 05/04/2016 16:14:13 Jose Carroll ([id number]) -------------------------------------------------------------------------------- Encounter Discharge Information Details Patient Name: Jose Carroll A. Date of Service: 05/04/2016 3:30 PM Medical Record Number: [id number] Patient Account Number: [id number] Date of Birth/Sex: 01-29-1965 (51 y.o. Male) Treating RN: Ahmed Prima Primary Care  Janathan Bribiesca: [name] Other Clinician: Referring Alix Lahmann: [name] Treating Salvatore Poe/Extender: Tito Dine in Treatment: 1 Encounter Discharge Information Items Discharge Pain Level: 0 Discharge Condition: Stable Ambulatory Status: Ambulatory Discharge Destination: Home Transportation: Private Auto Accompanied By: self Schedule Follow-up Appointment: Yes Medication Reconciliation completed and provided to Patient/Care No Shaqueta Casady: Provided on Clinical Summary of Care: 05/04/2016 Form Type Recipient Paper Patient GJ Electronic Signature(s) Signed: 05/04/2016 3:44:12 PM By: Ruthine Dose Entered By: Ruthine Dose on 05/04/2016 15:44:12 Jose Carroll ([id number]) -------------------------------------------------------------------------------- General Visit Notes Details Patient Name: Jose Carroll. Date of Service: 05/04/2016 3:30 PM Medical Record Number: [id number] Patient Account Number: [id number] Date of Birth/Sex: [birth year] (51 y.o. Male) Treating RN: Ahmed Prima Primary Care Cina Klumpp: [name] Other Clinician: Referring Peng Thorstenson: [name] Treating Dorsie Burich/Extender: Ricard Dillon Weeks in Treatment: 1 Notes Gave pt blood sugar level monitoring chart. Electronic Signature(s) Signed: 05/04/2016 4:19:56 PM By: Alric Quan Entered By: Alric Quan on 05/04/2016 15:37:31 Jose Carroll ([id number]) -------------------------------------------------------------------------------- Lower Extremity Assessment Details Patient Name: Jose Carroll Date of Service: 05/04/2016 3:30 PM Medical Record Number: [id number] Patient Account Number: [id number] Date of Birth/Sex: Dec 18, 1965 (51 y.o. Male) Treating RN: Ahmed Prima Primary Care Shaena Parkerson: [name] Other Clinician: Referring Jevon Littlepage: [name] Treating Dennice Tindol/Extender: Ricard Dillon Weeks in Treatment: 1 Edema  Assessment Assessed: [Left: No] [Right: No] E[Left: dema] [Right: :] Calf Left: Right: Point of Measurement: 32 cm From Medial Instep cm 38.8 cm Ankle Left: Right: Point of Measurement: 10 cm From Medial Instep cm 22 cm Vascular Assessment Pulses: Posterior Tibial Extremity colors, Jose growth, and conditions: Extremity Color: [Right:Normal] Temperature of Extremity: [Right:Cool] Capillary Refill: [Right:> 3 seconds] Toe Nail Assessment Left: Right: Thick: Yes Discolored: Yes Deformed: No Improper Length and Hygiene: Yes Electronic Signature(s) Signed: 05/04/2016 4:19:56 PM By: Alric Quan Entered  By: Alric Quan on 05/04/2016 15:21:06 Jose Carroll ([id number]) -------------------------------------------------------------------------------- Multi Wound Chart Details Patient Name: Jose Carroll, Jose A. Date of Service: 05/04/2016 3:30 PM Medical Record Number: [id number] Patient Account Number: [id number] Date of Birth/Sex: 05-27-1965 (51 y.o. Male) Treating RN: Ahmed Prima Primary Care Briyana Badman: [name] Other Clinician: Referring Rubylee Zamarripa: [name] Treating Gryffin Altice/Extender: Ricard Dillon Weeks in Treatment: 1 Vital Signs Height(in): 70 Pulse(bpm): 95 Weight(lbs): 216.3 Blood Pressure 111/62 (mmHg): Body Mass Index(BMI): 31 Temperature(F): 98.0 Respiratory Rate 18 (breaths/min): Photos: [N/A:N/A] Wound Location: Right Lower Leg - Anterior Right Foot - Medial N/A Wounding Event: Gradually Appeared Gradually Appeared N/A Primary Etiology: Diabetic Wound/Ulcer of Diabetic Wound/Ulcer of N/A the Lower Extremity the Lower Extremity Comorbid History: Chronic Obstructive Chronic Obstructive N/A Pulmonary Disease Pulmonary Disease (COPD), Angina, (COPD), Angina, Coronary Artery Disease, Coronary Artery Disease, Hypertension, Peripheral Hypertension, Peripheral Venous Disease, Type II Venous Disease, Type II Diabetes,  Osteoarthritis, Diabetes, Osteoarthritis, Neuropathy Neuropathy Date Acquired: 04/06/2016 04/06/2016 N/A Weeks of Treatment: 1 1 N/A Wound Status: Open Open N/A Measurements L x W x D 0.8x0.6x0.1 0.2x0.2x0.1 N/A (cm) Area (cm) : 0.377 0.031 N/A Volume (cm) : 0.038 0.003 N/A % Reduction in Area: 82.00% 92.10% N/A % Reduction in Volume: 81.80% 92.30% N/A Classification: Grade 1 Grade 1 N/A Exudate Amount: Large Large N/A Jose Carroll, Jose Carroll ([id number]) Exudate Type: Serosanguineous Serosanguineous N/A Exudate Color: red, brown red, brown N/A Wound Margin: Distinct, outline attached Distinct, outline attached N/A Granulation Amount: Large (67-100%) Large (67-100%) N/A Granulation Quality: Red Red N/A Necrotic Amount: Small (1-33%) Small (1-33%) N/A Necrotic Tissue: Eschar Adherent Slough N/A Epithelialization: None None N/A Periwound Skin Texture: No Abnormalities Noted No Abnormalities Noted N/A Periwound Skin Dry/Scaly: Yes Dry/Scaly: Yes N/A Moisture: Periwound Skin Color: Erythema: Yes Erythema: Yes N/A Erythema Location: Circumferential Circumferential N/A Temperature: No Abnormality No Abnormality N/A Tenderness on Yes Yes N/A Palpation: Wound Preparation: Ulcer Cleansing: Ulcer Cleansing: N/A Rinsed/Irrigated with Rinsed/Irrigated with Saline Saline Topical Anesthetic Topical Anesthetic Applied: Other: lidocaine Applied: Other: lidocaine 4% 4% Treatment Notes Wound #1 (Right, Anterior Lower Leg) 1. Cleansed with: Clean wound with Normal Saline Cleanse wound with antibacterial soap and water 2. Anesthetic Topical Lidocaine 4% cream to wound bed prior to debridement 4. Dressing Applied: Other dressing (specify in notes) 5. Secondary Dressing Applied ABD Pad 7. Secured with Tape Notes PolyMem, kerlix, coban Wound #2 (Right, Medial Foot) 1. Cleansed with: Clean wound with Normal Saline Cleanse wound with antibacterial soap and water 2. Anesthetic Topical  Lidocaine 4% cream to wound bed prior to debridement 4. Dressing Applied: Jose Carroll, Jose Carroll ([id number]) Other dressing (specify in notes) 5. Secondary Dressing Applied ABD Pad 7. Secured with Tape Notes PolyMem, kerlix, coban Electronic Signature(s) Signed: 05/05/2016 7:39:10 AM By: Linton Ham MD Previous Signature: 05/04/2016 4:19:56 PM Version By: Alric Quan Entered By: Linton Ham on 05/05/2016 07:27:00 Jose Carroll ([id number]) -------------------------------------------------------------------------------- Multi-Disciplinary Care Plan Details Patient Name: Jose Carroll Date of Service: 05/04/2016 3:30 PM Medical Record Number: [id number] Patient Account Number: [id number] Date of Birth/Sex: 07-29-1965 (51 y.o. Male) Treating RN: Ahmed Prima Primary Care Jearldine Cassady: [name] Other Clinician: Referring Devory Mckinzie: [name] Treating Juanmiguel Defelice/Extender: Ricard Dillon Weeks in Treatment: 1 Active Inactive ` Nutrition Nursing Diagnoses: Imbalanced nutrition Impaired glucose control: actual or potential Goals: Patient/caregiver agrees to and verbalizes understanding of need to use nutritional supplements and/or vitamins as prescribed Date Initiated: 04/27/2016 Target Resolution Date: 07/17/2016 Goal Status: Active Interventions: Assess patient nutrition upon admission and as needed per  policy Provide education on elevated blood sugars and impact on wound healing Notes: ` Orientation to the Wound Care Program Nursing Diagnoses: Knowledge deficit related to the wound healing center program Goals: Patient/caregiver will verbalize understanding of the [place] Date Initiated: 04/27/2016 Target Resolution Date: 05/15/2016 Goal Status: Active Interventions: Provide education on orientation to the wound center Notes: ` Wound/Skin Impairment Jose Carroll, Jose Carroll ([id number]) Nursing Diagnoses: Impaired tissue  integrity Knowledge deficit related to smoking impact on wound healing Knowledge deficit related to ulceration/compromised skin integrity Goals: Ulcer/skin breakdown will have a volume reduction of 80% by week 12 Date Initiated: 04/27/2016 Target Resolution Date: 07/10/2016 Goal Status: Active Interventions: Assess patient/caregiver ability to perform ulcer/skin care regimen upon admission and as needed Assess ulceration(s) every visit Provide education on smoking Provide education on ulcer and skin care Notes: Electronic Signature(s) Signed: 05/04/2016 4:19:56 PM By: Alric Quan Entered By: Alric Quan on 05/04/2016 15:25:43 Jose Carroll ([id number]) -------------------------------------------------------------------------------- Pain Assessment Details Patient Name: Jose Carroll A. Date of Service: 05/04/2016 3:30 PM Medical Record Number: [id number] Patient Account Number: [id number] Date of Birth/Sex: [birth year] (51 y.o. Male) Treating RN: Ahmed Prima Primary Care Meghen Akopyan: [name] Other Clinician: Referring Sondi Desch: [name] Treating Drea Jurewicz/Extender: Ricard Dillon Weeks in Treatment: 1 Active Problems Location of Pain Severity and Description of Pain Patient Has Paino No Site Locations With Dressing Change: No Pain Management and Medication Current Pain Management: Electronic Signature(s) Signed: 05/04/2016 4:19:56 PM By: Alric Quan Entered By: Alric Quan on 05/04/2016 15:08:41 Jose Carroll ([id number]) -------------------------------------------------------------------------------- Patient/Caregiver Education Details Jose Carroll Date of Service: 05/04/2016 3:30 PM Patient Name: A. Patient Account Number: [id number] Medical Record Treating RN: Ahmed Prima [id number] Number: Other Clinician: Date of Birth/Gender: [birth year] (51 y.o. Male) Treating ROBSON, [place] Primary Care Physician: HOLL[place],  [place]: G Referring Physician: Oletta Darter in Treatment: 1 Education Assessment Education Provided To: Patient Education Topics Provided Wound/Skin Impairment: Handouts: Other: do not get wrap wet Methods: Demonstration, Explain/Verbal Responses: State content correctly Electronic Signature(s) Signed: 05/04/2016 4:19:56 PM By: Alric Quan Entered By: Alric Quan on 05/04/2016 15:25:26 Jose Carroll ([id number]) -------------------------------------------------------------------------------- Wound Assessment Details Patient Name: Jose Carroll. Date of Service: 05/04/2016 3:30 PM Medical Record Number: [id number] Patient Account Number: [id number] Date of Birth/Sex: 10-21-1965 (51 y.o. Male) Treating RN: Ahmed Prima Primary Care Bhavya Eschete: [name] Other Clinician: Referring Haydn Hutsell: [name] Treating Kourtni Stineman/Extender: Ricard Dillon Weeks in Treatment: 1 Wound Status Wound Number: 1 Primary Diabetic Wound/Ulcer of the Lower Etiology: Extremity Wound Location: Right Lower Leg - Anterior Wound Open Wounding Event: Gradually Appeared Status: Date Acquired: 04/06/2016 Comorbid Chronic Obstructive Pulmonary Disease Weeks Of Treatment: 1 History: (COPD), Angina, Coronary Artery Clustered Wound: No Disease, Hypertension, Peripheral Venous Disease, Type II Diabetes, Osteoarthritis, Neuropathy Photos Photo Uploaded By: Alric Quan on 05/04/2016 16:17:27 Wound Measurements Length: (cm) 0.8 Width: (cm) 0.6 Depth: (cm) 0.1 Area: (cm) 0.377 Volume: (cm) 0.038 % Reduction in Area: 82% % Reduction in Volume: 81.8% Epithelialization: None Tunneling: No Undermining: No Wound Description Classification: Grade 1 Wound Margin: Distinct, outline attached Exudate Amount: Large Exudate Type: Serosanguineous Exudate Color: red, brown Foul Odor After Cleansing: No Slough/Fibrino No Wound  Bed Granulation Amount: Large (67-100%) Granulation Quality: Red Jose Carroll, Jose A. ([id number]) Necrotic Amount: Small (1-33%) Necrotic Quality: Eschar Periwound Skin Texture Texture Color No Abnormalities Noted: No No Abnormalities Noted: No Erythema: Yes Moisture Erythema Location: Circumferential No Abnormalities Noted: No Dry / Scaly: Yes Temperature / Pain Temperature: No Abnormality Tenderness  on Palpation: Yes Wound Preparation Ulcer Cleansing: Rinsed/Irrigated with Saline Topical Anesthetic Applied: Other: lidocaine 4%, Treatment Notes Wound #1 (Right, Anterior Lower Leg) 1. Cleansed with: Clean wound with Normal Saline Cleanse wound with antibacterial soap and water 2. Anesthetic Topical Lidocaine 4% cream to wound bed prior to debridement 4. Dressing Applied: Other dressing (specify in notes) 5. Secondary Dressing Applied ABD Pad 7. Secured with Tape Notes PolyMem, kerlix, coban Electronic Signature(s) Signed: 05/04/2016 4:19:56 PM By: Alric Quan Entered By: Alric Quan on 05/04/2016 15:18:48 Jose Carroll ([id number]) -------------------------------------------------------------------------------- Wound Assessment Details Patient Name: Jose Carroll. Date of Service: 05/04/2016 3:30 PM Medical Record Number: [id number] Patient Account Number: [id number] Date of Birth/Sex: 06/08/1965 (51 y.o. Male) Treating RN: Ahmed Prima Primary Care Stevee Valenta: [name] Other Clinician: Referring Dervin Vore: [name] Treating Galena Logie/Extender: Ricard Dillon Weeks in Treatment: 1 Wound Status Wound Number: 2 Primary Diabetic Wound/Ulcer of the Lower Etiology: Extremity Wound Location: Right Foot - Medial Wound Open Wounding Event: Gradually Appeared Status: Date Acquired: 04/06/2016 Comorbid Chronic Obstructive Pulmonary Disease Weeks Of Treatment: 1 History: (COPD), Angina, Coronary Artery Clustered Wound: No Disease,  Hypertension, Peripheral Venous Disease, Type II Diabetes, Osteoarthritis, Neuropathy Photos Photo Uploaded By: Alric Quan on 05/04/2016 16:17:28 Wound Measurements Length: (cm) 0.2 Width: (cm) 0.2 Depth: (cm) 0.1 Area: (cm) 0.031 Volume: (cm) 0.003 % Reduction in Area: 92.1% % Reduction in Volume: 92.3% Epithelialization: None Tunneling: No Undermining: No Wound Description Classification: Grade 1 Wound Margin: Distinct, outline attached Exudate Amount: Large Exudate Type: Serosanguineous Exudate Color: red, brown Foul Odor After Cleansing: No Slough/Fibrino No Wound Bed Granulation Amount: Large (67-100%) Exposed Structure Granulation Quality: Red Fascia Exposed: No Jose Carroll, Jose A. ([id number]) Necrotic Amount: Small (1-33%) Fat Layer (Subcutaneous Tissue) Exposed: No Necrotic Quality: Adherent Slough Tendon Exposed: No Muscle Exposed: No Joint Exposed: No Bone Exposed: No Limited to Skin Breakdown Periwound Skin Texture Texture Color No Abnormalities Noted: No No Abnormalities Noted: No Erythema: Yes Moisture Erythema Location: Circumferential No Abnormalities Noted: No Dry / Scaly: Yes Temperature / Pain Temperature: No Abnormality Tenderness on Palpation: Yes Wound Preparation Ulcer Cleansing: Rinsed/Irrigated with Saline Topical Anesthetic Applied: Other: lidocaine 4%, Treatment Notes Wound #2 (Right, Medial Foot) 1. Cleansed with: Clean wound with Normal Saline Cleanse wound with antibacterial soap and water 2. Anesthetic Topical Lidocaine 4% cream to wound bed prior to debridement 4. Dressing Applied: Other dressing (specify in notes) 5. Secondary Dressing Applied ABD Pad 7. Secured with Tape Notes PolyMem, kerlix, coban Electronic Signature(s) Signed: 05/04/2016 4:19:56 PM By: Alric Quan Entered By: Alric Quan on 05/04/2016 15:19:26 Jose Carroll  ([id number]) -------------------------------------------------------------------------------- Vitals Details Patient Name: Jose Carroll Date of Service: 05/04/2016 3:30 PM Medical Record Number: [id number] Patient Account Number: [id number] Date of Birth/Sex: 1965-11-17 (51 y.o. Male) Treating RN: Ahmed Prima Primary Care Aubre Quincy: [name] Other Clinician: Referring Laini Urick: [name] Treating Tangia Pinard/Extender: Ricard Dillon Weeks in Treatment: 1 Vital Signs Time Taken: 15:08 Temperature (F): 98.0 Height (in): 70 Pulse (bpm): 95 Weight (lbs): 216.3 Respiratory Rate (breaths/min): 18 Body Mass Index (BMI): 31 Blood Pressure (mmHg): 111/62 Reference Range: 80 - 120 mg / dl Electronic Signature(s) Signed: 05/04/2016 4:19:56 PM By: Alric Quan Entered By: Alric Quan on 05/04/2016 15:09:03

## 2016-05-05 NOTE — Progress Notes (Signed)
COLSON, BARCO ([id number]) Visit Report for 05/04/2016 Chief Complaint Document Details Jose Carroll Date of Service: 05/04/2016 3:30 PM Carroll Name: Jose Carroll Account Number: [id number] Medical Record Treating RN: Ahmed Prima [id number] Number: Other Clinician: Date of Birth/Sex: [birth year] (51 y.o. Male) Treating Jose Carroll Primary Care Provider: [name] Provider/Extender: G Referring Provider: [name] Weeks in Treatment: 1 Information Obtained from: Carroll Chief Complaint 04/27/16 Carroll is here for review of 2 wounds on the right leg and right foot that have been present for two months Electronic Signature(s) Signed: 05/05/2016 7:39:10 AM By: Linton Ham MD Entered By: Linton Ham on 05/05/2016 07:27:10 [name] ([id number]) -------------------------------------------------------------------------------- HPI Details Jose Carroll Date of Service: 05/04/2016 3:30 PM Carroll Name: Jose Carroll Account Number: [id number] Medical Record Treating RN: Ahmed Prima [id number] Number: Other Clinician: Date of Birth/Sex: 05/22/1965 (51 y.o. Male) Treating Linton Ham Primary Care Provider: [name] Provider/Extender: G Referring Provider: [name] Weeks in Treatment: 1 History of Present Illness HPI Description: 04/27/16; The is a 51 yo type 2 diabetic who still smokes. He tells me that he has known PAD followed by Dr. Chancy Milroy at [facility]. He thinks he had arterial studies by Dr Chancy Milroy (osp) less than a year ago. He tells me he had less than a block claudication at one point but is now on a combination of asa, Plavix and Zontivity and now has a 1/2 mile tolerance. 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 Approx 1 month ago he noted 2 open wounds on his right leg and foot . No obvious trauma. He has not been doing anything specific to the wounds. "leaving  open to air". He denies a hx of CAD although this appears on his problem list. He was given a course of clindamycin which he has completed His ABI in this clinic is 0.97. No major wound history although he has small healed areas on the right leg 05/04/16; I have obtained this man's records from [place]. He is indeed followed there. It would appear that he had arterial studies done on 07/09/15 this showed a right ABI of 0.83 a left of 0.86. He had Doppler waveforms that appear to be monophasic for the most part. He has had CTA on 2 occasions most recently on 07/25/15. This showed mild atherosclerotic calcification in the aorta and common iliac arteries without evidence of aneurysm. There was atherosclerotic irregularity and narrowing without occlusion or high-grade stenosis of the superficial and deep femoral arteries there was atherosclerotic irregularity and narrowing of the runoff vessels bilaterally which was progressive to the prior study in 2014 at which time he had good 3 vessel runoff. 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 The Carroll's wounds have improved since last time although there are small and touchdown. Located on the right calf and right foot Electronic Signature(s) Signed: 05/05/2016 7:39:10 AM By: Linton Ham MD Entered By: Linton Ham on 05/05/2016 07:31:02 [name] ([id number]) -------------------------------------------------------------------------------- Physical Exam Details Jose Carroll Date of Service: 05/04/2016 3:30 PM Carroll Name: Jose Carroll Account Number: [id number] Medical Record Treating RN: Ahmed Prima [id number] Number: Other Clinician: Date of Birth/Sex: [birth year] (51 y.o. Male) Treating Carroll, Jose Primary Care Provider: [name] Provider/Extender: G Referring Provider: [name] Weeks in Treatment: 1 Constitutional Sitting or standing Blood  Pressure is within target range for Carroll.. Pulse regular and within target range for Carroll.Marland Kitchen Respirations regular, non-labored and within target range.. Temperature is  normal and within the target range for the Carroll.. Carroll's appearance is neat and clean. Appears in no acute distress. Well nourished and well developed.. Eyes Conjunctivae clear. No discharge.Marland Kitchen Respiratory Respiratory effort is easy and symmetric bilaterally. Rate is normal at rest and on room air.. Cardiovascular Right dorsalis pedis pulses not palpable. Lymphatic None palpable in the popliteal or inguinal area. Psychiatric No evidence of depression, anxiety, or agitation. Calm, cooperative, and communicative. Appropriate interactions and affect.. Notes Wound exam; the Carroll continues to have 2 small wounds with the major one in the right anterior lower leg and then a second one on the right medial foot. No debridement today. The wounds had contracted especially the one on the foot. There is no evidence of infection in either area. Electronic Signature(s) Signed: 05/05/2016 7:39:10 AM By: Linton Ham MD Entered By: Linton Ham on 05/05/2016 07:33:16 [name] ([id number]) -------------------------------------------------------------------------------- Physician Orders Details Jose Carroll Date of Service: 05/04/2016 3:30 PM Carroll Name: Jose Carroll Account Number: [id number] Medical Record Treating RN: Ahmed Prima [id number] Number: Other Clinician: Date of Birth/Sex: 11/08/1965 (51 y.o. Male) Treating Carroll, Irvona Primary Care Provider: [name] Provider/Extender: G Referring Provider: Oletta Darter in Treatment: 1 Verbal / Phone Orders: Yes Clinician: Pinkerton, Debi Read Back and Verified: Yes Diagnosis Coding Wound Cleansing Wound #1 Right,Anterior Lower Leg o Clean wound with Normal Saline. o Cleanse wound with mild soap and water Wound #2  Right,Medial Foot o Clean wound with Normal Saline. o Cleanse wound with mild soap and water Anesthetic Wound #1 Right,Anterior Lower Leg o Topical Lidocaine 4% cream applied to wound bed prior to debridement Wound #2 Right,Medial Foot o Topical Lidocaine 4% cream applied to wound bed prior to debridement Primary Wound Dressing Wound #1 Right,Anterior Lower Leg o Other: - PolyMem Wound #2 Right,Medial Foot o Other: - PolyMem Secondary Dressing Wound #1 Right,Anterior Lower Leg o ABD pad Wound #2 Right,Medial Foot o ABD pad Dressing Change Frequency Wound #1 Right,Anterior Lower Leg o Change dressing every week TALIN, FEISTER A. ([id number]) Wound #2 Right,Medial Foot o Change dressing every week Follow-up Appointments Wound #1 Right,Anterior Lower Leg o Return Appointment in 1 week. Wound #2 Right,Medial Foot o Return Appointment in 1 week. Edema Control Wound #1 Right,Anterior Lower Leg o Kerlix and Coban - Right Lower Extremity - unna to anchor o Elevate legs to the level of the heart and pump ankles as often as possible Wound #2 Right,Medial Foot o Kerlix and Coban - Right Lower Extremity - unna to anchor o Elevate legs to the level of the heart and pump ankles as often as possible Additional Orders / Instructions Wound #1 Right,Anterior Lower Leg o Stop Smoking o Increase protein intake. Wound #2 Right,Medial Foot o Stop Smoking o Increase protein intake. Medications-please add to medication list. Wound #1 Right,Anterior Lower Leg o Other: - vitamin c, zinc, MVI Wound #2 Right,Medial Foot o Other: - vitamin c, zinc, MVI Electronic Signature(s) Signed: 05/04/2016 4:19:56 PM By: Alric Quan Signed: 05/05/2016 7:39:10 AM By: Linton Ham MD Entered By: Alric Quan on 05/04/2016 15:26:40 [name] ([id number]) -------------------------------------------------------------------------------- Problem  List Details Jose Carroll Date of Service: 05/04/2016 3:30 PM Carroll Name: Jose Carroll Account Number: [id number] Medical Record Treating RN: Ahmed Prima [id number] Number: Other Clinician: Date of Birth/Sex: September 19, 1965 (51 y.o. 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 Male) Treating Linton Ham Primary Care Provider: [name] Provider/Extender: G Referring Provider: [name] Weeks in Treatment: 1 Subjective Chief Complaint Information obtained from Carroll 04/27/16 Carroll is here for review of 2 wounds on the right leg and right foot that have been present for two months History of Present Illness (HPI) 04/27/16; The is a 51 yo type 2 diabetic who still smokes. He tells me that he has known PAD followed by Dr. Chancy Milroy at [facility]. He thinks he had arterial studies by Dr Chancy Milroy (osp) less  than a year ago. He tells me he had less than a block claudication at one point but is now on a combination of asa, Plavix and Zontivity and now has a 1/2 mile tolerance. 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 Approx 1 month ago he noted 2 open wounds on his right leg and foot . No obvious trauma. He has not been doing anything specific to the wounds. "leaving open to air". He denies a hx of CAD although this appears on his problem list. He was given a course of clindamycin which he has completed His ABI in this clinic is 0.97. No major wound history although he has small healed areas on the right leg 05/04/16; I have obtained this man's records from [place]. He is indeed followed there. It would appear that he had arterial studies done on 07/09/15 this showed a right ABI of 0.83 a left of 0.86. He had Doppler waveforms that appear to be monophasic for the most part. He has had CTA on 2 occasions most recently on 07/25/15. This showed mild atherosclerotic calcification in the aorta and common iliac arteries without evidence of aneurysm. There was atherosclerotic irregularity and narrowing without occlusion or high-grade stenosis of the superficial and deep femoral arteries there was atherosclerotic irregularity and narrowing of the runoff vessels bilaterally which was progressive to the prior study in 2014 at which time he had good 3 vessel runoff. 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 The Carroll's wounds have improved since last time although there are small and touchdown. Located on the right calf and right foot CANDACE, RAMUS A. ([id number]) Objective Constitutional Sitting or standing Blood Pressure is within target range for Carroll.. Pulse regular and within target range for Carroll.Marland Kitchen Respirations regular, non-labored and within target range.. Temperature is normal and  within the target range for the Carroll.. Carroll's appearance is neat and clean. Appears in no acute distress. Well nourished and well developed.. Vitals Time Taken: 3:08 PM, Height: 70 in, Weight: 216.3 lbs, BMI: 31, Temperature: 98.0 F, Pulse: 95 bpm, Respiratory Rate: 18 breaths/min, Blood Pressure: 111/62 mmHg. Eyes Conjunctivae clear. No discharge.Marland Kitchen Respiratory Respiratory effort is easy and symmetric bilaterally. Rate is normal at rest and on room air.. Cardiovascular Right dorsalis  pedis pulses not palpable. Lymphatic None palpable in the popliteal or inguinal area. Psychiatric No evidence of depression, anxiety, or agitation. Calm, cooperative, and communicative. Appropriate interactions and affect.. General Notes: Wound exam; the Carroll continues to have 2 small wounds with the major one in the right anterior lower leg and then a second one on the right medial foot. No debridement today. The wounds had contracted especially the one on the foot. There is no evidence of infection in either area. Integumentary (Hair, Skin) Wound #1 status is Open. Original cause of wound was Gradually Appeared. The wound is located on the Right,Anterior Lower Leg. The wound measures 0.8cm length x 0.6cm width x 0.1cm depth; 0.377cm^2 area and 0.038cm^3 volume. There is no tunneling or undermining noted. There is a large amount of serosanguineous drainage noted. The wound margin is distinct with the outline attached to the wound base. There is large (67-100%) red granulation within the wound bed. There is a small (1-33%) amount of necrotic tissue within the wound bed including Eschar. The periwound skin appearance exhibited: Dry/Scaly, Erythema. The surrounding wound skin color is noted with erythema which is circumferential. Periwound temperature was noted as No Abnormality. The periwound has tenderness on palpation. Wound #2 status is Open. Original cause of wound was Gradually Appeared. The  wound is located on the Right,Medial Foot. The wound measures 0.2cm length x 0.2cm width x 0.1cm depth; 0.031cm^2 area and 0.003cm^3 volume. The wound is limited to skin breakdown. There is no tunneling or undermining noted. There is a large amount of serosanguineous drainage noted. The wound margin is distinct with the outline attached to the wound base. There is large (67-100%) red granulation within the wound bed. There is a small (1-33%) amount of necrotic tissue within the wound bed including Adherent Slough. The periwound SILVINO, SELMAN A. ([id number]) skin appearance exhibited: Dry/Scaly, Erythema. The surrounding wound skin color is noted with erythema which is circumferential. Periwound temperature was noted as No Abnormality. The periwound has tenderness on palpation. 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 Cleanse wound with mild soap and water Wound #2 Right,Medial Foot: Clean wound with Normal Saline. Cleanse wound with mild soap and water Anesthetic: Wound #1 Right,Anterior Lower Leg: Topical Lidocaine 4% cream applied to wound bed prior to debridement Wound #2 Right,Medial Foot: Topical Lidocaine 4% cream applied to wound bed prior to debridement Primary Wound Dressing: Wound #1 Right,Anterior Lower Leg: Other: - PolyMem Wound #2 Right,Medial Foot: Other: - PolyMem Secondary Dressing: Wound #1 Right,Anterior Lower Leg: ABD pad Wound #2 Right,Medial Foot: ABD pad Dressing Change Frequency: Wound #1 Right,Anterior Lower Leg: Change dressing every week Wound #2  Right,Medial Foot: GARIK, DIAMANT A. ([id number]) Change dressing every week Follow-up Appointments: Wound #1 Right,Anterior Lower Leg: Return Appointment in 1 week. Wound #2 Right,Medial Foot: Return Appointment in 1 week. Edema Control: Wound #1 Right,Anterior Lower Leg: Kerlix and Coban - Right Lower Extremity - unna to anchor Elevate legs to the level of the heart and pump ankles as often as possible Wound #2 Right,Medial Foot: Kerlix and Coban - Right Lower Extremity - unna to anchor Elevate legs to the level of the  heart and pump ankles as often as possible Additional Orders / Instructions: Wound #1 Right,Anterior Lower Leg: Stop Smoking Increase protein intake. Wound #2 Right,Medial Foot: Stop Smoking Increase protein intake. Medications-please add to medication list.: Wound #1 Right,Anterior Lower Leg: Other: - vitamin c, zinc, MVI Wound #2 Right,Medial Foot: Other: - vitamin c, zinc, MVI #1 we continued with polymen based dressings. Wound dimensions seem better and the base of the wounds healthier. No debridement today versus last week. We're using ABDs Kerlix and a light Coban dressing and keeping these in place #2 this Carroll has significant PAD and his last CTA suggested progressive tibial disease of the runoff vessels. If these wounds do not heal or if they worsen he'll probably need further evaluation up to and including an arteriogram. #3 he has known claudication but states status improved on Zontivity in combination with aspirin and Plavix giving him pain free increase in walking distance significantly. I believe this was started in July 17 #4 he is a continued smoker but not a diabetic. He has known coronary artery disease Electronic Signature(s) Signed: 05/05/2016 7:39:10 AM By: Linton Ham MD Entered By: Linton Ham on 05/05/2016 07:37:18 [name] ([id number]) ERSKINE, STEINFELDT  ([id number]) -------------------------------------------------------------------------------- SuperBill Details Jose Carroll Date of Service: 05/04/2016 Carroll Name: Jose Carroll Account Number: [id number] Medical Record Treating RN: Ahmed Prima [id number] Number: Other Clinician: Date of Birth/Sex: [birth year] (51 y.o. 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 E11.42 Type 2 diabetes mellitus with diabetic polyneuropathy Facility Procedures CPT4 Code: [id number] Description: 99213 - WOUND CARE VISIT-LEV 3 EST PT Modifier: Quantity: 1 Physician Procedures CPT4: Description Modifier Quantity Code [id number] 99213 - WC PHYS LEVEL 3 - EST PT 1 ICD-10 Description Diagnosis E11.51 Type 2 diabetes mellitus with diabetic peripheral angiopathy without gangrene L97.211 Non-pressure chronic ulcer of right calf limited  to breakdown of skin L97.511 Non-pressure chronic ulcer of other part of right foot limited to breakdown of skin Electronic Signature(s) Signed: 05/05/2016 7:39:10 AM By: Linton Ham MD Entered By: Linton Ham on 05/05/2016 07:37:38

## 2016-05-11 ENCOUNTER — Encounter: Payer: Medicare Other | Attending: Internal Medicine | Admitting: Internal Medicine

## 2016-05-11 DIAGNOSIS — E1151 Type 2 diabetes mellitus with diabetic peripheral angiopathy without gangrene: Secondary | ICD-10-CM | POA: Insufficient documentation

## 2016-05-11 DIAGNOSIS — M5416 Radiculopathy, lumbar region: Secondary | ICD-10-CM | POA: Diagnosis not present

## 2016-05-11 DIAGNOSIS — Z7902 Long term (current) use of antithrombotics/antiplatelets: Secondary | ICD-10-CM | POA: Diagnosis not present

## 2016-05-11 DIAGNOSIS — E785 Hyperlipidemia, unspecified: Secondary | ICD-10-CM | POA: Diagnosis not present

## 2016-05-11 DIAGNOSIS — F17210 Nicotine dependence, cigarettes, uncomplicated: Secondary | ICD-10-CM | POA: Insufficient documentation

## 2016-05-11 DIAGNOSIS — E1142 Type 2 diabetes mellitus with diabetic polyneuropathy: Secondary | ICD-10-CM | POA: Insufficient documentation

## 2016-05-11 DIAGNOSIS — J449 Chronic obstructive pulmonary disease, unspecified: Secondary | ICD-10-CM | POA: Insufficient documentation

## 2016-05-11 DIAGNOSIS — I10 Essential (primary) hypertension: Secondary | ICD-10-CM | POA: Diagnosis not present

## 2016-05-11 DIAGNOSIS — L97211 Non-pressure chronic ulcer of right calf limited to breakdown of skin: Secondary | ICD-10-CM | POA: Diagnosis not present

## 2016-05-11 DIAGNOSIS — K219 Gastro-esophageal reflux disease without esophagitis: Secondary | ICD-10-CM | POA: Insufficient documentation

## 2016-05-11 DIAGNOSIS — L97511 Non-pressure chronic ulcer of other part of right foot limited to breakdown of skin: Secondary | ICD-10-CM | POA: Diagnosis not present

## 2016-05-11 DIAGNOSIS — Z79899 Other long term (current) drug therapy: Secondary | ICD-10-CM | POA: Insufficient documentation

## 2016-05-11 DIAGNOSIS — Z880 Allergy status to penicillin: Secondary | ICD-10-CM | POA: Insufficient documentation

## 2016-05-11 DIAGNOSIS — M1990 Unspecified osteoarthritis, unspecified site: Secondary | ICD-10-CM | POA: Diagnosis not present

## 2016-05-11 DIAGNOSIS — I2510 Atherosclerotic heart disease of native coronary artery without angina pectoris: Secondary | ICD-10-CM | POA: Diagnosis not present

## 2016-05-13 NOTE — Progress Notes (Signed)
PLEZ, BELTON ([id number]) Visit Report for 05/11/2016 Arrival Information Details Patient Name: Jose Carroll, Jose Carroll. Date of Service: 05/11/2016 10:15 AM Medical Record Number: [id number] Patient Account Number: [id number] Date of Birth/Sex: Feb 19, 1965 (51 y.o. Male) Treating RN: Montey Hora Primary Care Kazuma Elena: [name] Other Clinician: Referring Lexis Potenza: [name] Treating Myrikal Messmer/Extender: Tito Dine in Treatment: 2 Visit Information History Since Last Visit Added or deleted any medications: No Patient Arrived: Ambulatory Any new allergies or adverse reactions: No Arrival Time: 10:31 Had a fall or experienced change in No Accompanied By: self activities of daily living that may affect Transfer Assistance: None risk of falls: Patient Identification Verified: Yes Signs or symptoms of abuse/neglect since last No Secondary Verification Process Yes visito Completed: Hospitalized since last visit: No Patient Requires Transmission-Based No Has Dressing in Place as Prescribed: Yes Precautions: Has Compression in Place as Prescribed: Yes Patient Has Alerts: Yes Pain Present Now: No Patient Alerts: DM II Electronic Signature(s) Signed: 05/11/2016 4:44:24 PM By: Montey Hora Entered By: Montey Hora on 05/11/2016 10:32:58 Jose Carroll ([id number]) -------------------------------------------------------------------------------- Clinic Level of Care Assessment Details Patient Name: Jose Carroll. Date of Service: 05/11/2016 10:15 AM Medical Record Number: [id number] Patient Account Number: [id number] Date of Birth/Sex: 1965/02/12 (51 y.o. Male) Treating RN: Montey Hora Primary Care Areebah Meinders: [name] Other Clinician: Referring Verita Kuroda: [name] Treating Kearie Mennen/Extender: Tito Dine in Treatment: 2 Clinic Level of Care Assessment Items TOOL 4 Quantity Score []  - Use when only an EandM is performed on  FOLLOW-UP visit 0 ASSESSMENTS - Nursing Assessment / Reassessment X - Reassessment of Co-morbidities (includes updates in patient status) 1 10 X - Reassessment of Adherence to Treatment Plan 1 5 ASSESSMENTS - Wound and Skin Assessment / Reassessment []  - Simple Wound Assessment / Reassessment - one wound 0 X - Complex Wound Assessment / Reassessment - multiple wounds 2 5 []  - Dermatologic / Skin Assessment (not related to wound area) 0 ASSESSMENTS - Focused Assessment X - Circumferential Edema Measurements - multi extremities 1 5 []  - Nutritional Assessment / Counseling / Intervention 0 X - Lower Extremity Assessment (monofilament, tuning fork, pulses) 1 5 []  - Peripheral Arterial Disease Assessment (using hand held doppler) 0 ASSESSMENTS - Ostomy and/or Continence Assessment and Care []  - Incontinence Assessment and Management 0 []  - Ostomy Care Assessment and Management (repouching, etc.) 0 PROCESS - Coordination of Care X - Simple Patient / Family Education for ongoing care 1 15 []  - Complex (extensive) Patient / Family Education for ongoing care 0 []  - Staff obtains Programmer, systems, Records, Test Results / Process Orders 0 []  - Staff telephones HHA, Nursing Homes / Clarify orders / etc 0 []  - Routine Transfer to another Facility (non-emergent condition) 0 ORYAN, WINTERTON ([id number]) []  - Routine Hospital Admission (non-emergent condition) 0 []  - New Admissions / Biomedical engineer / Ordering NPWT, Apligraf, etc. 0 []  - Emergency Hospital Admission (emergent condition) 0 X - Simple Discharge Coordination 1 10 []  - Complex (extensive) Discharge Coordination 0 PROCESS - Special Needs []  - Pediatric / Minor Patient Management 0 []  - Isolation Patient Management 0 []  - Hearing / Language / Visual special needs 0 []  - Assessment of Community assistance (transportation, D/C planning, etc.) 0 []  - Additional assistance / Altered mentation 0 []  - Support Surface(s) Assessment (bed,  cushion, seat, etc.) 0 INTERVENTIONS - Wound Cleansing / Measurement []  - Simple Wound Cleansing - one wound 0 X - Complex Wound Cleansing - multiple wounds 2 5  X - Wound Imaging (photographs - any number of wounds) 1 5 []  - Wound Tracing (instead of photographs) 0 []  - Simple Wound Measurement - one wound 0 X - Complex Wound Measurement - multiple wounds 2 5 INTERVENTIONS - Wound Dressings []  - Small Wound Dressing one or multiple wounds 0 []  - Medium Wound Dressing one or multiple wounds 0 X - Large Wound Dressing one or multiple wounds 1 20 []  - Application of Medications - topical 0 []  - Application of Medications - injection 0 INTERVENTIONS - Miscellaneous []  - External ear exam 0 Jose Carroll, Jose A. ([id number]) []  - Specimen Collection (cultures, biopsies, blood, body fluids, etc.) 0 []  - Specimen(s) / Culture(s) sent or taken to Lab for analysis 0 []  - Patient Transfer (multiple staff / Harrel Lemon Lift / Similar devices) 0 []  - Simple Staple / Suture removal (25 or less) 0 []  - Complex Staple / Suture removal (26 or more) 0 []  - Hypo / Hyperglycemic Management (close monitor of Blood Glucose) 0 []  - Ankle / Brachial Index (ABI) - do not check if billed separately 0 X - Vital Signs 1 5 Has the patient been seen at the hospital within the last three years: Yes Total Score: 110 Level Of Care: New/Established - Level 3 Electronic Signature(s) Signed: 05/11/2016 4:44:24 PM By: Montey Hora Entered By: Montey Hora on 05/11/2016 11:29:13 Jose Carroll ([id number]) -------------------------------------------------------------------------------- Encounter Discharge Information Details Patient Name: Jose Osgood A. Date of Service: 05/11/2016 10:15 AM Medical Record Number: [id number] Patient Account Number: [id number] Date of Birth/Sex: [birth year] (51 y.o. Male) Treating RN: Montey Hora Primary Care Gared Gillie: [name] Other Clinician: Referring Siani Utke: [name] Treating Zakia Sainato/Extender: Tito Dine in Treatment: 2 Encounter Discharge Information Items Discharge Pain Level: 0 Discharge Condition: Stable Ambulatory Status: Ambulatory Discharge Destination: Home Transportation: Private Auto Accompanied By: self Schedule Follow-up Appointment: Yes Medication Reconciliation completed and provided to Patient/Care No Johnie Stadel: Provided on Clinical Summary of Care: 05/11/2016 Form Type Recipient Paper Patient GJ Electronic Signature(s) Signed: 05/11/2016 11:28:25 AM By: Montey Hora Previous Signature: 05/11/2016 11:03:16 AM Version By: Ruthine Dose Entered By: Montey Hora on 05/11/2016 11:28:25 Jose Carroll ([id number]) -------------------------------------------------------------------------------- General Visit Notes Details Patient Name: Jose Carroll. Date of Service: 05/11/2016 10:15 AM Medical Record Number: [id number] Patient Account Number: [id number] Date of Birth/Sex: [birth year] (51 y.o. Male) Treating RN: Montey Hora Primary Care Dashia Caldeira: [name] Other Clinician: Referring Aayushi Solorzano: [name] Treating Dory Demont/Extender: Ricard Dillon Weeks in Treatment: 2 Notes Patient given CBG log and educated to record blood sugars and bring log to appointments Electronic Signature(s) Signed: 05/11/2016 3:57:05 PM By: Montey Hora Entered By: Montey Hora on 05/11/2016 15:57:05 Jose Carroll ([id number]) -------------------------------------------------------------------------------- Lower Extremity Assessment Details Patient Name: Jose Carroll. Date of Service: 05/11/2016 10:15 AM Medical Record Number: [id number] Patient Account Number: [id number] Date of Birth/Sex: [birth year] (50 y.o. Male) Treating RN: Montey Hora Primary Care Tiandre Teall: [name] Other Clinician: Referring Selen Smucker: [name] Treating Troyce Gieske/Extender: Ricard Dillon Weeks in  Treatment: 2 Edema Assessment Assessed: [Left: No] [Right: No] E[Left: dema] [Right: :] Calf Left: Right: Point of Measurement: 32 cm From Medial Instep cm 37.5 cm Ankle Left: Right: Point of Measurement: 10 cm From Medial Instep cm 22.8 cm Vascular Assessment Pulses: Dorsalis Pedis Palpable: [Right:Yes] Posterior Tibial Extremity colors, hair growth, and conditions: Extremity Color: [Right:Normal] Hair Growth on Extremity: [Right:Yes] Temperature of Extremity: [Right:Warm] Capillary Refill: [Right:< 3 seconds] Electronic Signature(s) Signed: 05/11/2016 4:44:24 PM By: Montey Hora  Entered By: Montey Hora on 05/11/2016 10:40:21 Jose Carroll ([id number]) -------------------------------------------------------------------------------- Multi Wound Chart Details Patient Name: Jose Carroll, Jose A. Date of Service: 05/11/2016 10:15 AM Medical Record Number: [id number] Patient Account Number: [id number] Date of Birth/Sex: [birth year] (51 y.o. 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] [2:the Lower Extremity] [N/A:N/A] Comorbid History: [1:Chronic Obstructive Pulmonary Disease (COPD), Angina, Coronary Artery Disease, Hypertension, Peripheral Venous Disease, Type II Diabetes, Osteoarthritis, Neuropathy] [2:N/A] [N/A:N/A] Date Acquired: [1:04/06/2016] [2:04/06/2016]  [N/A:N/A] Weeks of Treatment: [1:2] [2:2] [N/A:N/A] Wound Status: [1:Open] [2:Healed - Epithelialized] [N/A:N/A] Measurements L x W x D 0.5x0.2x0.1 [2:0x0x0] [N/A:N/A] (cm) Area (cm) : [1:0.079] [2:0] [N/A:N/A] Volume (cm) : [1:0.008] [2:0] [N/A:N/A] % Reduction in Area: [1:96.20%] [2:100.00%] [N/A:N/A] % Reduction in Volume: 96.20% [2:100.00%] [N/A:N/A] Classification: [1:Grade 1] [2:Grade 1] [N/A:N/A] Exudate Amount: [1:Large] [2:N/A] [N/A:N/A] Exudate Type: [1:Serosanguineous] [2:N/A] [N/A:N/A] Exudate Color: [1:red, brown] [2:N/A] [N/A:N/A] Wound Margin: [1:Distinct, outline attached N/A] [N/A:N/A] Granulation Amount: [1:Large (67-100%)] [2:N/A] [N/A:N/A] Granulation Quality: [1:Red] [2:N/A] [N/A:N/A] Necrotic Amount: [1:None Present (0%)] [2:N/A] [N/A:N/A] Epithelialization: None N/A N/A Periwound Skin Texture: No Abnormalities Noted No Abnormalities Noted N/A Periwound Skin Dry/Scaly: Yes No Abnormalities Noted N/A Moisture: Periwound Skin Color: Erythema: Yes No Abnormalities Noted N/A Erythema Location: Circumferential N/A N/A Temperature: No Abnormality N/A N/A Tenderness on Yes No N/A Palpation: Wound Preparation: Ulcer Cleansing: N/A N/A Rinsed/Irrigated with Saline Topical Anesthetic Applied: Other: lidocaine 4% Treatment Notes Electronic Signature(s) Signed: 05/12/2016 7:53:49 AM By: Linton Ham MD Entered By: Linton Ham on 05/11/2016 11:10:42 Jose Carroll ([id number]) -------------------------------------------------------------------------------- [place] Details Patient Name: Jose Carroll. Date of Service: 05/11/2016 10:15 AM Medical Record Number: [id number] Patient Account Number: [id number] Date of Birth/Sex: [birth year] (51 y.o. Male) Treating RN: Montey Hora Primary Care Myasia Sinatra: [name] Other Clinician: Referring Yashira Offenberger: [name] Treating Chico Cawood/Extender: Ricard Dillon Weeks in  Treatment: 2 Active Inactive ` Nutrition Nursing Diagnoses: Imbalanced nutrition Impaired glucose control: actual or potential Goals: Patient/caregiver agrees to and verbalizes understanding of need to use nutritional supplements and/or vitamins as prescribed Date Initiated: 04/27/2016 Target Resolution Date: 07/17/2016 Goal Status: Active Interventions: Assess patient nutrition upon admission and as needed per policy Provide education on elevated blood sugars and impact on wound healing Notes: ` Orientation to the Wound Care Program Nursing Diagnoses: Knowledge deficit related to the wound healing center program Goals: Patient/caregiver will verbalize understanding of the [place] [facility] Date Initiated: 04/27/2016 Target Resolution Date: 05/15/2016 Goal Status: Active Interventions: Provide education on orientation to the wound center Notes: ` Wound/Skin Impairment HAYWOOD, MEINDERS ([id number]) Nursing Diagnoses: Impaired tissue integrity Knowledge deficit related to smoking impact on wound healing Knowledge deficit related to ulceration/compromised skin integrity Goals: Ulcer/skin breakdown will have a volume reduction of 80% by week 12 Date Initiated: 04/27/2016 Target Resolution Date: 07/10/2016 Goal Status: Active Interventions: Assess patient/caregiver ability to perform ulcer/skin care regimen upon admission and as needed Assess ulceration(s) every visit Provide education on smoking Provide education on ulcer and skin care Notes: Electronic Signature(s) Signed: 05/11/2016 4:44:24 PM By: Montey Hora Entered By: Montey Hora  on 05/11/2016 10:49:14 Jose Carroll, Jose Carroll ([id number]) -------------------------------------------------------------------------------- Pain Assessment Details Patient Name: Jose Carroll, MACKINS. Date of Service: 05/11/2016 10:15 AM Medical Record Number: [id number] Patient Account Number: [id number] Date of Birth/Sex: 1965/01/24  (51 y.o. Male) Treating RN: Montey Hora Primary Care Gotham Raden: [name] Other Clinician: Referring Raianna Slight: [name] Treating Jatavian Calica/Extender: Ricard Dillon Weeks in Treatment: 2 Active Problems Location of Pain Severity and Description of Pain Patient Has Paino No Site Locations Pain Management and Medication Current Pain Management: Notes Topical or injectable lidocaine is offered to patient for acute pain when surgical debridement is performed. If needed, Patient is instructed to use over the counter pain medication for the following 24-48 hours after debridement. Wound care MDs do not prescribed pain medications. Patient has chronic pain or uncontrolled pain. Patient has been instructed to make an appointment with their Primary Care Physician for pain management. Electronic Signature(s) Signed: 05/11/2016 4:44:24 PM By: Montey Hora Entered By: Montey Hora on 05/11/2016 10:33:06 Jose Carroll ([id number]) -------------------------------------------------------------------------------- Patient/Caregiver Education Details Jose Osgood Date of Service: 05/11/2016 10:15 AM Patient Name: A. Patient Account Number: [id number] Medical Record Treating RN: Montey Hora [id number] Number: Other Clinician: Date of Birth/Gender: 09-01-1965 (51 y.o. Male) Treating ROBSON, [place] Primary Care Physician: HOLLAND, [place]: G Referring Physician: Oletta Darter in Treatment: 2 Education Assessment Education Provided To: Patient Education Topics Provided Venous: Handouts: Other: leg elevation Methods: Explain/Verbal Responses: State content correctly Electronic Signature(s) Signed: 05/11/2016 4:44:24 PM By: Montey Hora Entered By: Montey Hora on 05/11/2016 11:28:40 Jose Carroll ([id number]) -------------------------------------------------------------------------------- Wound Assessment Details Patient Name:  Jose Osgood A. Date of Service: 05/11/2016 10:15 AM Medical Record Number: [id number] Patient Account Number: [id number] Date of Birth/Sex: 11-21-1965 (51 y.o. Male) Treating RN: Montey Hora Primary Care Kyarra Vancamp: [name] Other Clinician: Referring Yong Grieser: [name] Treating Marlow Hendrie/Extender: Ricard Dillon Weeks in Treatment: 2 Wound Status Wound Number: 1 Primary Diabetic Wound/Ulcer of the Lower Etiology: Extremity Wound Location: Right Lower Leg - Anterior Wound Open Wounding Event: Gradually Appeared Status: Date Acquired: 04/06/2016 Comorbid Chronic Obstructive Pulmonary Disease Weeks Of Treatment: 2 History: (COPD), Angina, Coronary Artery Clustered Wound: No Disease, Hypertension, Peripheral Venous Disease, Type II Diabetes, Osteoarthritis, Neuropathy Photos Photo Uploaded By: Montey Hora on 05/11/2016 11:41:21 Wound Measurements Length: (cm) 0.5 Width: (cm) 0.2 Depth: (cm) 0.1 Area: (cm) 0.079 Volume: (cm) 0.008 % Reduction in Area: 96.2% % Reduction in Volume: 96.2% Epithelialization: None Tunneling: No Undermining: No Wound Description Classification: Grade 1 Wound Margin: Distinct, outline attached Exudate Amount: Large Exudate Type: Serosanguineous Exudate Color: red, brown Foul Odor After Cleansing: No Slough/Fibrino No Wound Bed Granulation Amount: Large (67-100%) Granulation Quality: Red JAQUARI, RECKNER A. ([id number]) Necrotic Amount: None Present (0%) Periwound Skin Texture Texture Color No Abnormalities Noted: No No Abnormalities Noted: No Erythema: Yes Moisture Erythema Location: Circumferential No Abnormalities Noted: No Dry / Scaly: Yes Temperature / Pain Temperature: No Abnormality Tenderness on Palpation: Yes Wound Preparation Ulcer Cleansing: Rinsed/Irrigated with Saline Topical Anesthetic Applied: Other: lidocaine 4%, Treatment Notes Wound #1 (Right, Anterior Lower Leg) 1. Cleansed  with: Cleanse wound with antibacterial soap and water 4. Dressing Applied: Hydrogel Promogran 5. Secondary Dressing Applied Non-Adherent pad 7. Secured with Tape Other (specify in notes) Notes kerlix, coban Electronic Signature(s) Signed: 05/11/2016 4:44:24 PM By: Montey Hora Entered By: Montey Hora on 05/11/2016 10:49:04 Jose Carroll ([id number]) -------------------------------------------------------------------------------- Wound Assessment Details Patient Name: Jose Osgood A. Date of Service: 05/11/2016 10:15 AM Medical Record Number: [id number] Patient Account Number: [id number] Date  of Birth/Sex: [birth year] (51 y.o. Male) Treating RN: Montey Hora Primary Care Skylyn Slezak: [name] Other Clinician: Referring Tashawna Thom: [name] Treating Manasa Spease/Extender: Ricard Dillon Weeks in Treatment: 2 Wound Status Wound Number: 2 Primary Diabetic Wound/Ulcer of the Lower Etiology: Extremity Wound Location: Right, Medial Foot Wound Status: Healed - Epithelialized Wounding Event: Gradually Appeared Date Acquired: 04/06/2016 Weeks Of Treatment: 2 Clustered Wound: No Photos Photo Uploaded By: Montey Hora on 05/11/2016 11:41:22 Wound Measurements Length: (cm) 0 % Reduction Width: (cm) 0 % Reduction Depth: (cm) 0 Area: (cm) 0 Volume: (cm) 0 in Area: 100% in Volume: 100% Wound Description Classification: Grade 1 Periwound Skin Texture Texture Color No Abnormalities Noted: No No Abnormalities Noted: No Moisture No Abnormalities Noted: No Electronic Signature(s) Signed: 05/11/2016 4:44:24 PM By: Wray Kearns ([id number]) Entered By: Montey Hora on 05/11/2016 10:45:38 Jose Carroll ([id number]) -------------------------------------------------------------------------------- Vitals Details Patient Name: Jose Osgood A. Date of Service: 05/11/2016 10:15 AM Medical Record Number: [id number] Patient Account  Number: [id number] Date of Birth/Sex: [birth year] (51 y.o. Male) Treating RN: Montey Hora Primary Care Benjamim Harnish: [name] Other Clinician: Referring Braydon Kullman: [name] Treating Wynee Matarazzo/Extender: Ricard Dillon Weeks in Treatment: 2 Vital Signs Time Taken: 10:33 Temperature (F): 98.2 Height (in): 70 Pulse (bpm): 82 Weight (lbs): 216.3 Respiratory Rate (breaths/min): 18 Body Mass Index (BMI): 31 Blood Pressure (mmHg): 93/59 Reference Range: 80 - 120 mg / dl Electronic Signature(s) Signed: 05/11/2016 4:44:24 PM By: Montey Hora Entered By: Montey Hora on 05/11/2016 10:33:53

## 2016-05-14 NOTE — Progress Notes (Signed)
TYKWON, FERA ([id number]) Visit Report for 05/11/2016 Chief Complaint Document Details Jose Carroll Date of Service: 05/11/2016 10:15 AM Patient Name: A. Patient Account Number: [id number] Medical Record Treating RN: Montey Hora [id number] Number: Other Clinician: Date of Birth/Sex: 02/28/1965 (51 y.o. Male) Treating ROBSON, [place] Primary Care Provider: [name] Provider/Extender: G Referring Provider: [name] Weeks in Treatment: 2 Information Obtained from: Patient Chief Complaint 04/27/16 Patient is here for review of 2 wounds on the right leg and right foot that have been present for two months Electronic Signature(s) Signed: 05/12/2016 7:53:49 AM By: Linton Ham MD Entered By: Linton Ham on 05/11/2016 11:11:01 [name] ([id number]) -------------------------------------------------------------------------------- HPI Details Jose Carroll Date of Service: 05/11/2016 10:15 AM Patient Name: A. Patient Account Number: [id number] Medical Record Treating RN: Montey Hora [id number] Number: Other Clinician: Date of Birth/Sex: May 29, 1965 (51 y.o. Male) Treating Linton Ham Primary Care Provider: [name] Provider/Extender: G Referring Provider: [name] Weeks in Treatment: 2 History of Present Illness HPI Description: 04/27/16; The is a 51 yo type 2 diabetic who still smokes. He tells me that he has known PAD followed by Dr. Chancy Milroy at [place]. He thinks he had arterial studies by Dr Chancy Milroy (osp) less than a year ago. He tells me he had less than a block claudication at one point but is now on a combination of asa, Plavix and Zontivity and now has a 1/2 mile tolerance. 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 Approx 1 month ago he noted 2 open wounds on his right leg and foot . No obvious trauma. He has not been doing anything specific to the wounds. "Jose Carroll". He denies a hx of CAD although this appears on his problem list. He was given a course of clindamycin which he has completed His ABI in this clinic is 0.97. No major wound history although he has small healed areas on the right leg 05/04/16; I have obtained this man's records from [place]. He is indeed followed there. It would appear that he had arterial studies done on 07/09/15 this showed a right ABI of 0.83 a left of 0.86. He had Doppler waveforms that appear to be monophasic for the most part. He has had CTA on 2 occasions most recently on 07/25/15. This showed mild atherosclerotic calcification in the aorta and common iliac arteries without evidence of aneurysm. There was atherosclerotic irregularity and narrowing without occlusion or high-grade stenosis of the superficial and deep femoral arteries there was atherosclerotic irregularity and narrowing of the runoff vessels bilaterally which was progressive to the prior study in 2014 at which time he had good 3 vessel runoff. 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 The patient's wounds have improved since last time although there are small and touchdown. Located on the right calf and right foot Electronic Signature(s) Signed: 05/12/2016 7:53:49 AM By: Linton Ham MD Entered By: Linton Ham on 05/11/2016 11:11:08 [name] ([id number]) -------------------------------------------------------------------------------- Physical Exam Details Jose Carroll Date of Service: 05/11/2016 10:15 AM Patient Name: A. Patient Account Number: [id number] Medical Record Treating RN: Montey Hora [id number] Number: Other Clinician: Date of Birth/Sex: 07-08-1965 (51 y.o. Male) Treating ROBSON, MICHAEL Primary Care Provider: [name] Provider/Extender: G Referring Provider: [name] Weeks in Treatment: 2 Constitutional Patient is hypotensive.appears  well. Cardiovascular DP softly palpable. Notes Wound exam; 2 small wounds mostly improved. right medial foot is essentially closed. no debridement Electronic Signature(s) Signed: 05/12/2016  7:53:49 AM By: Linton Ham MD Entered By: Linton Ham on 05/11/2016 11:12:46 [name] ([id number]) -------------------------------------------------------------------------------- Physician Orders Details Jose Carroll Date of Service: 05/11/2016 10:15 AM Patient Name: A. Patient Account Number: [id number] Medical Record Treating RN: Montey Hora [id number] Number: Other Clinician: Date of Birth/Sex: 12-31-1965 (51 y.o. Male) Treating ROBSON, [place] Primary Care Provider: [name] Provider/Extender: G Referring Provider: [name] Weeks in Treatment: 2 Verbal / Phone Orders: No Diagnosis Coding Wound Cleansing Wound #1 Right,Anterior Lower Leg o Clean wound with Normal Saline. o Cleanse wound with mild soap and water Anesthetic Wound #1 Right,Anterior Lower Leg o Topical Lidocaine 4% cream applied to wound bed prior to debridement Primary Wound Dressing Wound #1 Right,Anterior Lower Leg o Hydrogel o Promogran Secondary Dressing Wound #1 Right,Anterior Lower Leg o Non-adherent pad Dressing Change Frequency Wound #1 Right,Anterior Lower Leg o Change dressing every week Follow-up Appointments Wound #1 Right,Anterior Lower Leg o Return Appointment in 1 week. Edema Control Wound #1 Right,Anterior Lower Leg o Kerlix and Coban - Right Lower Extremity - unna to anchor o Elevate legs to the level of the heart and pump ankles as often as possible Additional Orders / Instructions Wound #1 Right,Anterior Lower Leg [name], [name] ([id number]) o Stop Smoking o Increase protein intake. Medications-please add to medication list. Wound #1 Right,Anterior Lower Leg o Other: - vitamin c, zinc, MVI Electronic Signature(s) Signed:  05/11/2016 4:44:24 PM By: Montey Hora Signed: 05/12/2016 7:53:49 AM By: Linton Ham MD Entered By: Montey Hora on 05/11/2016 10:50:10 [name] ([id number]) -------------------------------------------------------------------------------- Problem List Details Jose Carroll Date of Service: 05/11/2016 10:15 AM Patient Name: A. Patient Account Number: [id number] Medical Record Treating RN: Montey Hora [id number] Number: Other Clinician: Date of Birth/Sex: [birth year] (51 y.o. 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 Patient Account Number: [id number] Medical Record Treating RN: Montey Hora [id number] Number: Other Clinician: Date of Birth/Sex: 1965/03/26 (51 y.o. Male) Treating Linton Ham Primary Care Provider: [name] Provider/Extender: G Referring Provider: [name] Weeks in Treatment: 2 Subjective Chief Complaint Information obtained from Patient 04/27/16 Patient is here for review of 2 wounds on the right leg and right foot that  have been present for two months History of Present Illness (HPI) 04/27/16; The is a 51 yo type 2 diabetic who still smokes. He tells me that he has known PAD followed by Dr. Chancy Milroy at [place]. He thinks he had arterial studies by Dr Chancy Milroy (osp) less than a year ago. He tells me he had less than a block claudication at one point but is now on a combination of asa, Plavix and Zontivity and now has a 1/2 mile tolerance. 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 Approx 1 month ago he noted 2 open wounds on his right leg and foot .  No obvious trauma. He has not been doing anything specific to the wounds. "Jose open to Carroll". He denies a hx of CAD although this appears on his problem list. He was given a course of clindamycin which he has completed His ABI in this clinic is 0.97. No major wound history although he has small healed areas on the right leg 05/04/16; I have obtained this man's records from [place]. He is indeed followed there. It would appear that he had arterial studies done on 07/09/15 this showed a right ABI of 0.83 a left of 0.86. He had Doppler waveforms that appear to be monophasic for the most part. He has had CTA on 2 occasions most recently on 07/25/15. This showed mild atherosclerotic calcification in the aorta and common iliac arteries without evidence of aneurysm. There was atherosclerotic irregularity and narrowing without occlusion or high-grade stenosis of the superficial and deep femoral arteries there was atherosclerotic irregularity and narrowing of the runoff vessels bilaterally which was progressive to the prior study in 2014 at which time he had good 3 vessel runoff. 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 The patient's wounds have improved since last time although there are small and touchdown. Located on the right calf and right foot TIRTH, COTHRON. ([id number]) Objective Constitutional Patient is hypotensive.appears well. Vitals Time Taken: 10:33 AM, Height: 70 in, Weight: 216.3 lbs, BMI: 31, Temperature: 98.2 F, Pulse: 82 bpm, Respiratory Rate: 18 breaths/min, Blood Pressure: 93/59 mmHg. Cardiovascular DP softly palpable. General Notes: Wound exam; 2 small wounds mostly improved. right medial foot is essentially closed. no debridement Integumentary (Hair, Skin) Wound #1 status is Open. Original cause of wound was Gradually Appeared. The wound is located on the Right,Anterior Lower Leg. The wound measures 0.5cm length x 0.2cm width x 0.1cm depth; 0.079cm^2 area and 0.008cm^3 volume. There is no tunneling or undermining noted. There is a large amount of serosanguineous drainage noted. The wound margin is distinct with the outline attached to the wound base. There is large (67-100%) red granulation within the wound bed. There is no necrotic tissue within the wound bed. The periwound skin appearance exhibited: Dry/Scaly, Erythema. The surrounding wound skin color is noted with erythema which is circumferential. Periwound temperature was noted as No Abnormality. The periwound has tenderness on palpation. Wound #2 status is Healed - Epithelialized. Original cause of wound was Gradually Appeared. The wound is located on the Right,Medial Foot. The wound measures 0cm length x 0cm width x 0cm depth; 0cm^2 area and 0cm^3 volume. 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 ([id number]) Plan Wound Cleansing: Wound #1 Right,Anterior Lower Leg: Clean wound with Normal Saline. Cleanse wound with mild  soap and water Anesthetic: Wound #1 Right,Anterior Lower Leg: Topical Lidocaine 4% cream applied to wound bed prior to debridement Primary Wound Dressing: Wound #1 Right,Anterior Lower Leg: Hydrogel Promogran Secondary Dressing: Wound #1 Right,Anterior Lower Leg: Non-adherent pad Dressing Change Frequency: Wound #1 Right,Anterior Lower Leg: Change dressing every week Follow-up Appointments: Wound #1 Right,Anterior Lower Leg: Return Appointment in 1 week. Edema Control: Wound #1 Right,Anterior Lower Leg: Kerlix and Coban - Right Lower Extremity - unna to anchor Elevate legs to the level of  the heart and pump ankles as often as possible Additional Orders / Instructions: Wound #1 Right,Anterior Lower Leg: Stop Smoking Increase protein intake. Medications-please add to medication list.: Wound #1 Right,Anterior Lower Leg: Other: - vitamin c, zinc, MVI 1 changed to prisma hydrogen as polyment stuck to the wound bed 2 hopefully closed next week, as of now does not appear that there is any need for further vascular evaluation Electronic Signature(s) Signed: 05/12/2016 8:46:39 AM By: Gretta Cool, RN, BSN, Kim RN, BSN Signed: 05/12/2016 5:52:53 PM By: Linton Ham MD [name] ([id number]) Previous Signature: 05/12/2016 7:53:49 AM Version By: Linton Ham MD Entered By: Gretta Cool, RN, BSN, Kim on 05/12/2016 08:46:39 [name] ([id number]) -------------------------------------------------------------------------------- SuperBill Details Jose Carroll Date of Service: 05/11/2016 Patient Name: A. Patient Account Number: [id number] Medical Record Treating RN: Montey Hora [id number] Number: Other Clinician: Date of Birth/Sex: 1965/05/12 (51 y.o. 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 E11.42 Type 2 diabetes mellitus with diabetic polyneuropathy Facility Procedures CPT4 Code: [id number] Description: 99213 - WOUND CARE VISIT-LEV 3 EST PT Modifier: Quantity: 1 Physician Procedures CPT4: Description Modifier Quantity Code [id number] 91638 - WC PHYS LEVEL 2 - EST PT 1 ICD-10 Description Diagnosis E11.51 Type 2 diabetes mellitus with diabetic peripheral angiopathy without gangrene L97.211 Non-pressure chronic ulcer of right calf limited  to breakdown of skin Electronic Signature(s) Signed: 05/12/2016 7:53:49 AM By: Linton Ham MD Previous Signature: 05/11/2016 11:29:21 AM Version By: Montey Hora Entered By: Linton Ham on 05/11/2016 12:05:07

## 2016-05-18 ENCOUNTER — Encounter: Payer: Medicare Other | Admitting: Internal Medicine

## 2016-05-18 DIAGNOSIS — L97211 Non-pressure chronic ulcer of right calf limited to breakdown of skin: Secondary | ICD-10-CM | POA: Diagnosis not present

## 2016-05-20 NOTE — Progress Notes (Signed)
ELIS, SAUBER ([id number]) Visit Report for 05/18/2016 Arrival Information Details Patient Name: Jose Carroll, Jose Carroll. Date of Service: 05/18/2016 9:15 AM Medical Record Number: [id number] Patient Account Number: [id number] Date of Birth/Sex: Feb 03, 1965 (51 y.o. Male) Treating RN: Montey Hora Primary Care Donica Derouin: [name] Other Clinician: Referring Jamyia Fortune: [name] Treating Roemello Speyer/Extender: Tito Dine in Treatment: 3 Visit Information History Since Last Visit Added or deleted any medications: No Patient Arrived: Ambulatory Any new allergies or adverse reactions: No Arrival Time: 09:25 Had a fall or experienced change in No Accompanied By: self activities of daily living that may affect Transfer Assistance: None risk of falls: Patient Identification Verified: Yes Signs or symptoms of abuse/neglect since last No Secondary Verification Process Yes visito Completed: Hospitalized since last visit: No Patient Requires Transmission-Based No Has Dressing in Place as Prescribed: Yes Precautions: Has Compression in Place as Prescribed: Yes Patient Has Alerts: Yes Pain Present Now: No Patient Alerts: DM II Electronic Signature(s) Signed: 05/18/2016 4:56:51 PM By: Montey Hora Entered By: Montey Hora on 05/18/2016 09:25:51 Jose Carroll ([id number]) -------------------------------------------------------------------------------- Clinic Level of Care Assessment Details Patient Name: Jose Carroll. Date of Service: 05/18/2016 9:15 AM Medical Record Number: [id number] Patient Account Number: [id number] Date of Birth/Sex: 1965/03/18 (51 y.o. Male) Treating RN: Montey Hora Primary Care Makayla Confer: [name] Other Clinician: Referring Jorgeluis Gurganus: [name] Treating Brigett Estell/Extender: Tito Dine in Treatment: 3 Clinic Level of Care Assessment Items TOOL 4 Quantity Score []  - Use when only an EandM is performed on  FOLLOW-UP visit 0 ASSESSMENTS - Nursing Assessment / Reassessment X - Reassessment of Co-morbidities (includes updates in patient status) 1 10 X - Reassessment of Adherence to Treatment Plan 1 5 ASSESSMENTS - Wound and Skin Assessment / Reassessment X - Simple Wound Assessment / Reassessment - one wound 1 5 []  - Complex Wound Assessment / Reassessment - multiple wounds 0 []  - Dermatologic / Skin Assessment (not related to wound area) 0 ASSESSMENTS - Focused Assessment []  - Circumferential Edema Measurements - multi extremities 0 []  - Nutritional Assessment / Counseling / Intervention 0 X - Lower Extremity Assessment (monofilament, tuning fork, pulses) 1 5 []  - Peripheral Arterial Disease Assessment (using hand held doppler) 0 ASSESSMENTS - Ostomy and/or Continence Assessment and Care []  - Incontinence Assessment and Management 0 []  - Ostomy Care Assessment and Management (repouching, etc.) 0 PROCESS - Coordination of Care X - Simple Patient / Family Education for ongoing care 1 15 []  - Complex (extensive) Patient / Family Education for ongoing care 0 []  - Staff obtains Programmer, systems, Records, Test Results / Process Orders 0 []  - Staff telephones HHA, Nursing Homes / Clarify orders / etc 0 []  - Routine Transfer to another Facility (non-emergent condition) 0 Jose Carroll, Jose Carroll ([id number]) []  - Routine Hospital Admission (non-emergent condition) 0 []  - New Admissions / Biomedical engineer / Ordering NPWT, Apligraf, etc. 0 []  - Emergency Hospital Admission (emergent condition) 0 X - Simple Discharge Coordination 1 10 []  - Complex (extensive) Discharge Coordination 0 PROCESS - Special Needs []  - Pediatric / Minor Patient Management 0 []  - Isolation Patient Management 0 []  - Hearing / Language / Visual special needs 0 []  - Assessment of Community assistance (transportation, D/C planning, etc.) 0 []  - Additional assistance / Altered mentation 0 []  - Support Surface(s) Assessment (bed,  cushion, seat, etc.) 0 INTERVENTIONS - Wound Cleansing / Measurement X - Simple Wound Cleansing - one wound 1 5 []  - Complex Wound Cleansing - multiple wounds 0 X -  Wound Imaging (photographs - any number of wounds) 1 5 []  - Wound Tracing (instead of photographs) 0 X - Simple Wound Measurement - one wound 1 5 []  - Complex Wound Measurement - multiple wounds 0 INTERVENTIONS - Wound Dressings []  - Small Wound Dressing one or multiple wounds 0 []  - Medium Wound Dressing one or multiple wounds 0 []  - Large Wound Dressing one or multiple wounds 0 []  - Application of Medications - topical 0 []  - Application of Medications - injection 0 INTERVENTIONS - Miscellaneous []  - External ear exam 0 Jose Carroll, Jose A. ([id number]) []  - Specimen Collection (cultures, biopsies, blood, body fluids, etc.) 0 []  - Specimen(s) / Culture(s) sent or taken to Lab for analysis 0 []  - Patient Transfer (multiple staff / Harrel Lemon Lift / Similar devices) 0 []  - Simple Staple / Suture removal (25 or less) 0 []  - Complex Staple / Suture removal (26 or more) 0 []  - Hypo / Hyperglycemic Management (close monitor of Blood Glucose) 0 []  - Ankle / Brachial Index (ABI) - do not check if billed separately 0 X - Vital Signs 1 5 Has the patient been seen at the hospital within the last three years: Yes Total Score: 70 Level Of Care: New/Established - Level 2 Electronic Signature(s) Signed: 05/18/2016 4:56:51 PM By: Montey Hora Entered By: Montey Hora on 05/18/2016 10:01:54 Jose Carroll ([id number]) -------------------------------------------------------------------------------- Encounter Discharge Information Details Patient Name: Jose Osgood A. Date of Service: 05/18/2016 9:15 AM Medical Record Number: [id number] Patient Account Number: [id number] Date of Birth/Sex: Dec 27, 1965 (51 y.o. Male) Treating RN: Montey Hora Primary Care Nathen Balaban: [name] Other Clinician: Referring Felissa Blouch: [name] Treating Robbin Loughmiller/Extender: Tito Dine in Treatment: 3 Encounter Discharge Information Items Discharge Pain Level: 0 Discharge Condition: Stable Ambulatory Status: Ambulatory Discharge Destination: Home Transportation: Private Auto Accompanied By: self Schedule Follow-up Appointment: No Medication Reconciliation completed No and provided to Patient/Care Siboney Requejo: Patient Clinical Summary of Care: Declined Electronic Signature(s) Signed: 05/18/2016 10:40:22 AM By: Ruthine Dose Previous Signature: 05/18/2016 10:22:21 AM Version By: Montey Hora Entered By: Ruthine Dose on 05/18/2016 10:40:21 Jose Carroll ([id number]) -------------------------------------------------------------------------------- Lower Extremity Assessment Details Patient Name: Jose Carroll. Date of Service: 05/18/2016 9:15 AM Medical Record Number: [id number] Patient Account Number: [id number] Date of Birth/Sex: Jul 03, 1965 (51 y.o. Male) Treating RN: Montey Hora Primary Care Lashonta Pilling: [name] Other Clinician: Referring Kayla Deshaies: [name] Treating Ellington Cornia/Extender: Ricard Dillon Weeks in Treatment: 3 Edema Assessment Assessed: [Left: No] [Right: No] Edema: [Left: Ye] [Right: s] Vascular Assessment Pulses: Dorsalis Pedis Palpable: [Right:Yes] Posterior Tibial Extremity colors, hair growth, and conditions: Extremity Color: [Right:Hyperpigmented] Hair Growth on Extremity: [Right:Yes] Temperature of Extremity: [Right:Warm] Capillary Refill: [Right:< 3 seconds] Electronic Signature(s) Signed: 05/18/2016 4:56:51 PM By: Montey Hora Entered By: Montey Hora on 05/18/2016 09:48:27 Jose Carroll ([id number]) -------------------------------------------------------------------------------- Multi Wound Chart Details Patient Name: Jose Osgood A. Date of Service: 05/18/2016 9:15 AM Medical Record Number: [id number] Patient Account Number:  [id number] Date of Birth/Sex: [birth year] (51 y.o. Male) Treating RN: Montey Hora Primary Care Isam Unrein: [name] Other Clinician: Referring Kayce Betty: [name] Treating Jailynne Opperman/Extender: Ricard Dillon Weeks in Treatment: 3 Vital Signs Height(in): 70 Pulse(bpm): 79 Weight(lbs): 216.3 Blood Pressure 99/58 (mmHg): Body Mass Index(BMI): 31 Temperature(F): 98.3 Respiratory Rate 18 (breaths/min): Photos: [1:No Photos] [N/A:N/A] Wound Location: [1:Right, Anterior Lower Leg] [N/A:N/A] Wounding Event: [1:Gradually Appeared] [N/A:N/A] Primary Etiology: [1:Diabetic Wound/Ulcer of the Lower Extremity] [N/A:N/A] Date Acquired: [1:04/06/2016] [N/A:N/A] Weeks of Treatment: [1:3] [N/A:N/A] Wound Status: [1:Healed - Epithelialized] [N/A:N/A]  Measurements L x W x D 0x0x0 [N/A:N/A] (cm) Area (cm) : [1:0] [N/A:N/A] Volume (cm) : [1:0] [N/A:N/A] % Reduction in Area: [1:100.00%] [N/A:N/A] % Reduction in Volume: 100.00% [N/A:N/A] Classification: [1:Grade 1] [N/A:N/A] Periwound Skin Texture: No Abnormalities Noted [N/A:N/A] Periwound Skin [1:No Abnormalities Noted] [N/A:N/A] Moisture: Periwound Skin Color: No Abnormalities Noted [N/A:N/A] Tenderness on [1:No] [N/A:N/A] Treatment Notes Electronic Signature(s) Signed: 05/19/2016 12:30:45 PM By: Linton Ham MD Entered By: Linton Ham on 05/18/2016 10:39:45 Jose Carroll ([id number]) Jose Carroll, Jose Carroll ([id number]) -------------------------------------------------------------------------------- Multi-Disciplinary Care Plan Details Patient Name: Jose Osgood A. Date of Service: 05/18/2016 9:15 AM Medical Record Number: [id number] Patient Account Number: [id number] Date of Birth/Sex: 03/09/1965 (51 y.o. Male) Treating RN: Montey Hora Primary Care Michelle Wnek: [name] Other Clinician: Referring Cana Mignano: [name] Treating Aeisha Minarik/Extender: Ricard Dillon Weeks in Treatment: 3 Active  Inactive Electronic Signature(s) Signed: 05/18/2016 4:56:51 PM By: Montey Hora Entered By: Montey Hora on 05/18/2016 09:49:56 Jose Carroll ([id number]) -------------------------------------------------------------------------------- Pain Assessment Details Patient Name: Jose Carroll. Date of Service: 05/18/2016 9:15 AM Medical Record Number: [id number] Patient Account Number: [id number] Date of Birth/Sex: 1965-12-29 (51 y.o. Male) Treating RN: Montey Hora Primary Care Beni Turrell: [name] Other Clinician: Referring Tristy Udovich: [name] Treating Pasha Broad/Extender: Ricard Dillon Weeks in Treatment: 3 Active Problems Location of Pain Severity and Description of Pain Patient Has Paino No Site Locations Pain Management and Medication Current Pain Management: Notes Topical or injectable lidocaine is offered to patient for acute pain when surgical debridement is performed. If needed, Patient is instructed to use over the counter pain medication for the following 24-48 hours after debridement. Wound care MDs do not prescribed pain medications. Patient has chronic pain or uncontrolled pain. Patient has been instructed to make an appointment with their Primary Care Physician for pain management. Electronic Signature(s) Signed: 05/18/2016 4:56:51 PM By: Montey Hora Entered By: Montey Hora on 05/18/2016 09:27:25 Jose Carroll ([id number]) -------------------------------------------------------------------------------- Patient/Caregiver Education Details Jose Osgood Date of Service: 05/18/2016 9:15 AM Patient Name: A. Patient Account Number: [id number] Medical Record Treating RN: Montey Hora [id number] Number: Other Clinician: Date of Birth/Gender: 02-14-1965 (51 y.o. Male) Treating ROBSON, [place] Primary Care Physician: HOLLAND, [place]: G Referring Physician: Oletta Darter in Treatment: 3 Education  Assessment Education Provided To: Patient Education Topics Provided Venous: Handouts: Other: wear compression daily Methods: Explain/Verbal Responses: State content correctly Electronic Signature(s) Signed: 05/18/2016 4:56:51 PM By: Montey Hora Entered By: Montey Hora on 05/18/2016 10:22:44 Jose Carroll ([id number]) -------------------------------------------------------------------------------- Wound Assessment Details Patient Name: Jose Osgood A. Date of Service: 05/18/2016 9:15 AM Medical Record Number: [id number] Patient Account Number: [id number] Date of Birth/Sex: 02-May-1965 (51 y.o. Male) Treating RN: Montey Hora Primary Care Ailyn Gladd: [name] Other Clinician: Referring Dhanush Jokerst: [name] Treating Kiely Cousar/Extender: Ricard Dillon Weeks in Treatment: 3 Wound Status Wound Number: 1 Primary Diabetic Wound/Ulcer of the Lower Etiology: Extremity Wound Location: Right, Anterior Lower Leg Wound Status: Healed - Epithelialized Wounding Event: Gradually Appeared Date Acquired: 04/06/2016 Weeks Of Treatment: 3 Clustered Wound: No Photos Photo Uploaded By: Montey Hora on 05/18/2016 16:53:05 Wound Measurements Length: (cm) 0 % Reduction Width: (cm) 0 % Reduction Depth: (cm) 0 Area: (cm) 0 Volume: (cm) 0 in Area: 100% in Volume: 100% Wound Description Classification: Grade 1 Periwound Skin Texture Texture Color No Abnormalities Noted: No No Abnormalities Noted: No Moisture No Abnormalities Noted: No Electronic Signature(s) Signed: 05/18/2016 4:56:51 PM By: Wray Kearns ([id number]) Entered By: Montey Hora on 05/18/2016 09:33:28 Jose Carroll ([id number]) --------------------------------------------------------------------------------  Vitals Details Patient Name: Jose Carroll, Jose Carroll. Date of Service: 05/18/2016 9:15 AM Medical Record Number: [id number] Patient Account Number: [id number] Date of  Birth/Sex: Jul 15, 1965 (51 y.o. Male) Treating RN: Montey Hora Primary Care Ceana Fiala: [name] Other Clinician: Referring Arianna Haydon: [name] Treating Ivann Trimarco/Extender: Ricard Dillon Weeks in Treatment: 3 Vital Signs Time Taken: 09:29 Temperature (F): 98.3 Height (in): 70 Pulse (bpm): 79 Weight (lbs): 216.3 Respiratory Rate (breaths/min): 18 Body Mass Index (BMI): 31 Blood Pressure (mmHg): 99/58 Reference Range: 80 - 120 mg / dl Electronic Signature(s) Signed: 05/18/2016 4:56:51 PM By: Montey Hora Entered By: Montey Hora on 05/18/2016 09:30:06

## 2016-05-20 NOTE — Progress Notes (Signed)
LEEROY, LOVINGS ([id number]) Visit Report for 05/18/2016 Chief Complaint Document Details Jose Carroll: 05/18/2016 9:15 AM Patient Name: A. Patient Account Number: [id number] Medical Record Treating RN: Montey Hora [id number] Number: Other Clinician: Date of Birth/Sex: 02-Dec-1965 (51 y.o. Male) Treating Kaliope Quinonez, [place] Primary Care Provider: [name] Provider/Extender: G Referring Provider: [name] Weeks in Treatment: 3 Information Obtained from: Patient Chief Complaint 04/27/16 Patient is here for review of 2 wounds on the right leg and right foot that have been present for two months Electronic Signature(s) Signed: 05/19/2016 12:30:45 PM By: Linton Ham MD Entered By: Linton Ham on 05/18/2016 10:39:56 Jose Carroll ([id number]) -------------------------------------------------------------------------------- HPI Details Jose Carroll: 05/18/2016 9:15 AM Patient Name: A. Patient Account Number: [id number] Medical Record Treating RN: Montey Hora [id number] Number: Other Clinician: Date of Birth/Sex: 1965-12-04 (51 y.o. Male) Treating Linton Ham Primary Care Provider: [name] Provider/Extender: G Referring Provider: [name] Weeks in Treatment: 3 History of Present Illness HPI Description: 04/27/16; The is a 51 yo type 2 diabetic who still smokes. He tells me that he has known PAD followed by Dr. Chancy Milroy at [place]. He thinks he had arterial studies by Dr Chancy Milroy (osp) less than a year ago. He tells me he had less than a block claudication at one point but is now on a combination of asa, Plavix and Zontivity and now has a 1/2 mile tolerance. 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 Approx 1 month ago he noted 2 open wounds on his right leg and foot . No obvious trauma. He has not been doing anything specific to the wounds. "leaving open  to air". He denies a hx of CAD although this appears on his problem list. He was given a course of clindamycin which he has completed His ABI in this clinic is 0.97. No major wound history although he has small healed areas on the right leg 05/04/16; I have obtained this man's records from [place]. He is indeed followed there. It would appear that he had arterial studies done on 07/09/15 this showed a right ABI of 0.83 a left of 0.86. He had Doppler waveforms that appear to be monophasic for the most part. He has had CTA on 2 occasions most recently on 07/25/15. This showed mild atherosclerotic calcification in the aorta and common iliac arteries without evidence of aneurysm. There was atherosclerotic irregularity and narrowing without occlusion or high-grade stenosis of the superficial and deep femoral arteries there was atherosclerotic irregularity and narrowing of the runoff vessels bilaterally which was progressive to the prior study in 2014 at which time he had good 3 vessel runoff. 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 The patient's wounds have improved since last time although there are small and touchdown. Located on the right calf and right foot 05/18/16; Wounds are totally epithelialized Electronic Signature(s) Signed: 05/19/2016 12:30:45 PM By: Linton Ham MD Entered By: Linton Ham on 05/18/2016 10:41:09 Jose Carroll ([id number]) -------------------------------------------------------------------------------- Physical Exam Details Jose Carroll: 05/18/2016 9:15 AM Patient Name: A. Patient Account Number: [id number] Medical Record Treating RN: Montey Hora [id number] Number: Other Clinician: Date of Birth/Sex: [birth year] (51 y.o. Male) Treating Letricia Krinsky Primary Care Provider: [name] Provider/Extender: G Referring Provider: [name] Weeks in Treatment:  3 Cardiovascular right DP palpable. Notes doing well. wound on the right anterior leg is fully epithelialized Electronic Signature(s) Signed: 05/19/2016 12:30:45 PM By: Dellia Nims,  Legrand Como MD Entered By: Linton Ham on 05/18/2016 10:42:24 Jose Carroll ([id number]) -------------------------------------------------------------------------------- Physician Orders Details Jose Carroll: 05/18/2016 9:15 AM Patient Name: A. Patient Account Number: [id number] Medical Record Treating RN: Montey Hora [id number] Number: Other Clinician: Date of Birth/Sex: Aug 03, 1965 (51 y.o. Male) Treating Dellia Nims, [place] Primary Care Provider: [name] Provider/Extender: G Referring Provider: [name] Weeks in Treatment: 3 Verbal / Phone Orders: No Diagnosis Coding Discharge From [facility] o Discharge from [place] Signature(s) Signed: 05/18/2016 4:56:51 PM By: Montey Hora Signed: 05/19/2016 12:30:45 PM By: Linton Ham MD Entered By: Montey Hora on 05/18/2016 09:51:39 Jose Carroll ([id number]) -------------------------------------------------------------------------------- Problem List Details Jose Carroll: 05/18/2016 9:15 AM Patient Name: A. Patient Account Number: [id number] Medical Record Treating RN: Montey Hora [id number] Number: Other Clinician: Date of Birth/Sex: 11/19/1965 (51 y.o. 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 Patient Account Number: [id number] Medical Record Treating RN: Montey Hora [id number] Number: Other Clinician: Date of Birth/Sex: 05-30-1965 (51 y.o. Male) Treating Linton Ham Primary Care Provider: [name] Provider/Extender: G Referring Provider: [name] Weeks in Treatment: 3 Subjective Chief Complaint Information obtained from Patient 04/27/16 Patient is here for review of 2 wounds on the right leg and right foot that have been present for two months History of Present Illness (HPI) 04/27/16; The is a 51 yo type 2 diabetic who still smokes. He tells me that he has known PAD followed by Dr. Chancy Milroy at [place]. He thinks he had arterial studies by Dr Chancy Milroy (osp) less than a year ago. He tells me he had less than a block claudication at one point but is now on a combination of asa, Plavix and Zontivity and now has a 1/2 mile tolerance. 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 Approx 1 month ago he noted 2 open wounds on his right leg and foot . No obvious trauma. He has not been doing anything specific to the wounds. "leaving open to air". He denies a hx of CAD although this appears on his problem list. He was given a course of clindamycin which he has completed His ABI in this clinic is 0.97. No major wound history although he has small healed areas on the right leg 05/04/16; I have obtained this man's records from [place]. He is indeed followed there. It  would appear that he had arterial studies done on 07/09/15 this showed a right ABI of 0.83 a left of 0.86. He had Doppler waveforms that appear to be monophasic for the most part. He has had CTA on 2 occasions most recently on 07/25/15. This showed mild atherosclerotic calcification in the aorta and common iliac arteries without evidence of aneurysm. There was atherosclerotic irregularity and narrowing without occlusion or high-grade stenosis of the  superficial and deep femoral arteries there was atherosclerotic irregularity and narrowing of the runoff vessels bilaterally which was progressive to the prior study in 2014 at which time he had good 3 vessel runoff. 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 The patient's wounds have improved since last time although there are small and touchdown. Located on the right calf and right foot 05/18/16; Wounds are totally epithelialized Jose Carroll, Jose A. ([id number]) Objective Constitutional Vitals Time Taken: 9:29 AM, Height: 70 in, Weight: 216.3 lbs, BMI: 31, Temperature: 98.3 F, Pulse: 79 bpm, Respiratory Rate: 18 breaths/min, Blood Pressure: 99/58 mmHg. Cardiovascular right DP palpable. General Notes: doing well. wound on the right anterior leg is fully epithelialized Integumentary (Hair, Skin) Wound #1 status is Healed - Epithelialized. Original cause of wound was Gradually Appeared. The wound is located on the Right,Anterior Lower Leg. The wound measures 0cm length x 0cm width x 0cm depth; 0cm^2 area and 0cm^3 volume. 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 2 he states he has his own stockings at home 3 known PAD followed by Dr. Chancy Milroy of cardiology. Electronic Signature(s) Signed: 05/19/2016 12:30:45 PM By: Linton Ham MD Entered By: Linton Ham on 05/18/2016 10:44:03 Jose Carroll ([id number]) -------------------------------------------------------------------------------- SuperBill Details Jose Carroll: 05/18/2016 Patient Name: A. Patient Account Number: [id number] Medical Record Treating RN: Montey Hora [id number] Number: Other Clinician: Date of Birth/Sex: 1965/09/17 (51 y.o. 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 E11.42 Type 2 diabetes mellitus with diabetic polyneuropathy Facility Procedures CPT4 Code: [id number] Description: [phone] - WOUND CARE VISIT-LEV 2 EST PT Modifier: Quantity: 1 Physician Procedures CPT4: Description Modifier Quantity Code [id number] - WC PHYS LEVEL 2 - EST PT 1 ICD-10 Description Diagnosis 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

## 2016-09-01 DIAGNOSIS — E1342 Other specified diabetes mellitus with diabetic polyneuropathy: Secondary | ICD-10-CM | POA: Insufficient documentation

## 2016-09-01 DIAGNOSIS — M7070 Other bursitis of hip, unspecified hip: Secondary | ICD-10-CM | POA: Insufficient documentation

## 2017-04-21 IMAGING — CR DG CHEST 2V
2 series · 2 of 2 positions shown · non-contrast
Comparison: PA and lateral chest 02/07/2013.

CLINICAL DATA: Headache, chest pain and shortness of breath for 3
days. Initial encounter.

EXAM:
CHEST  2 VIEW

[chest pa]
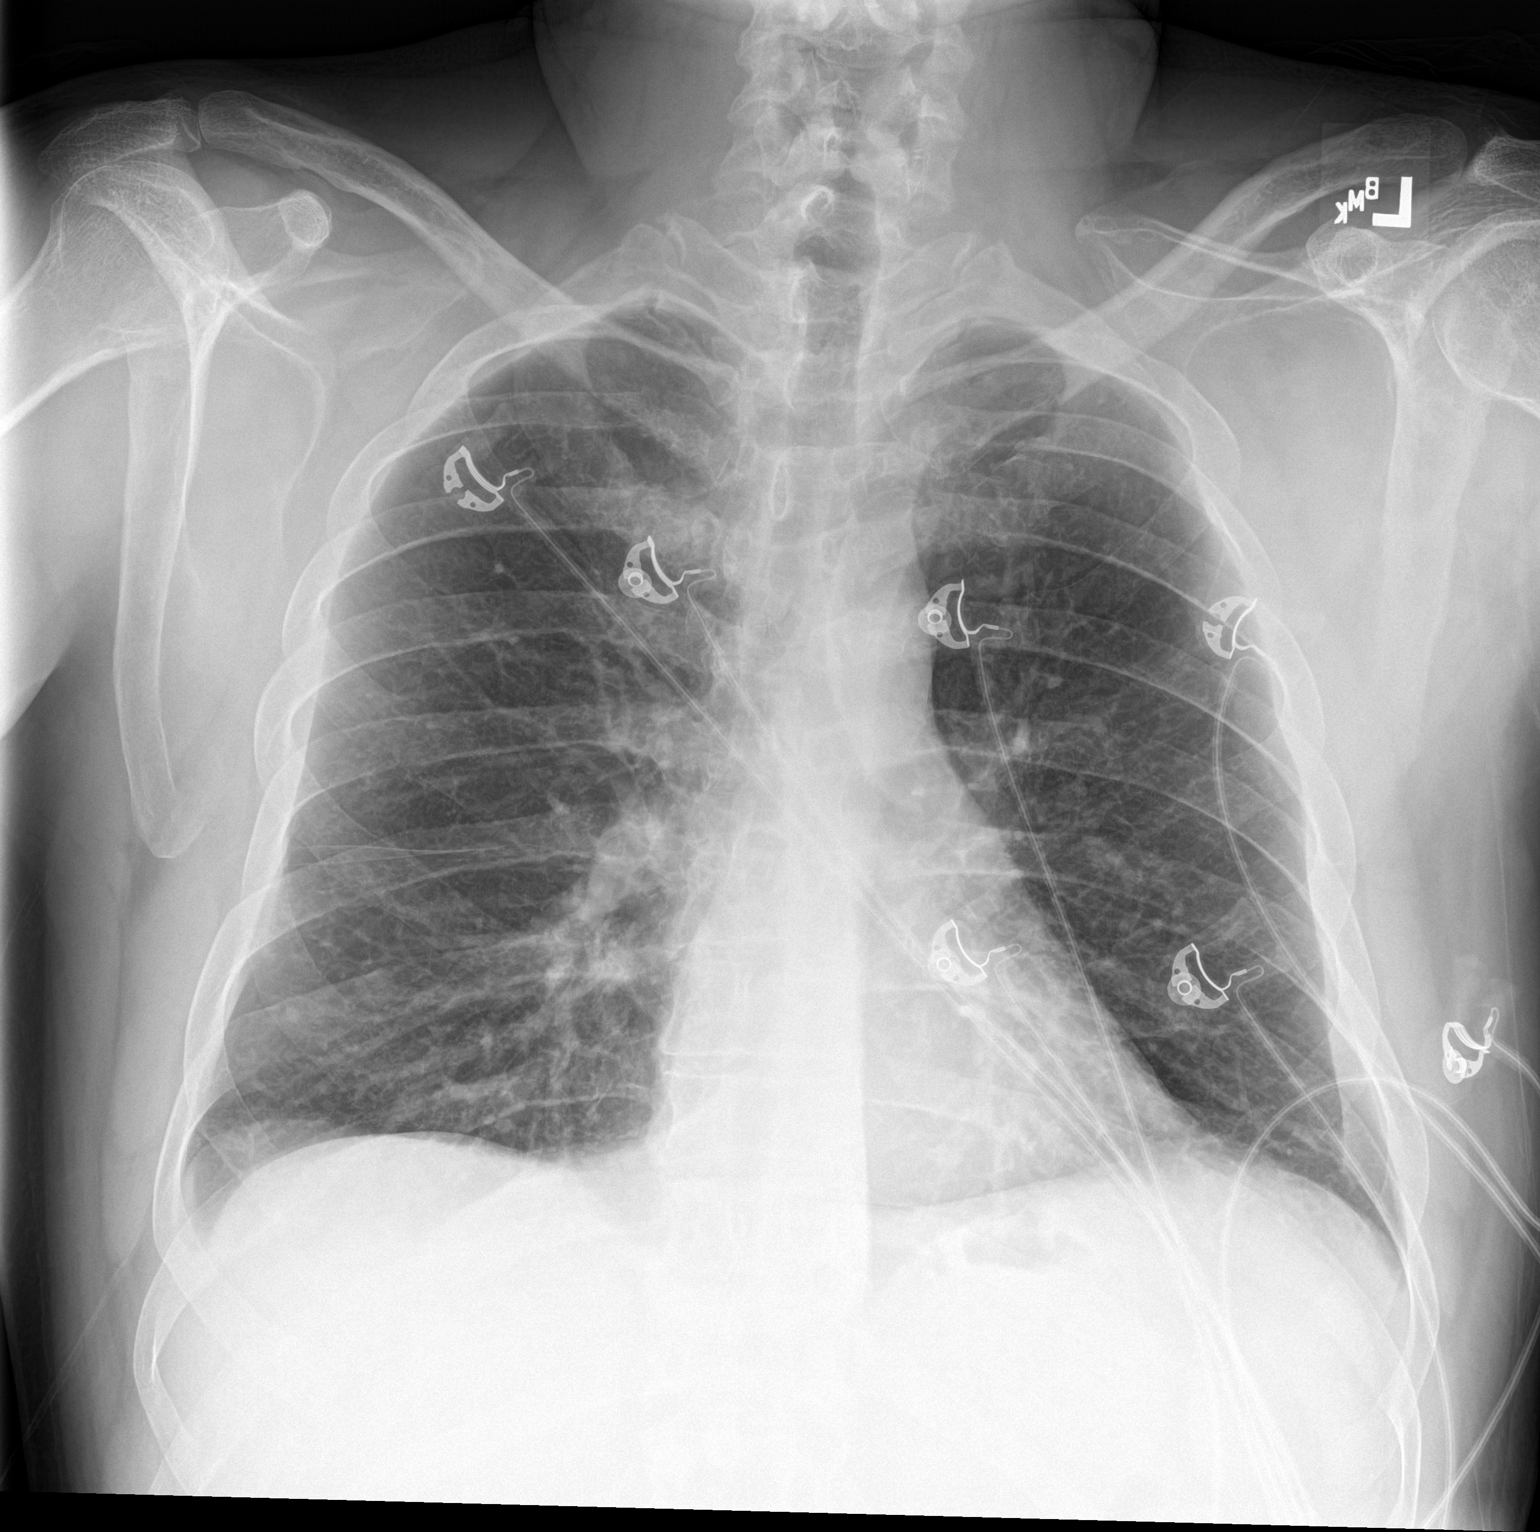

[chest lat]
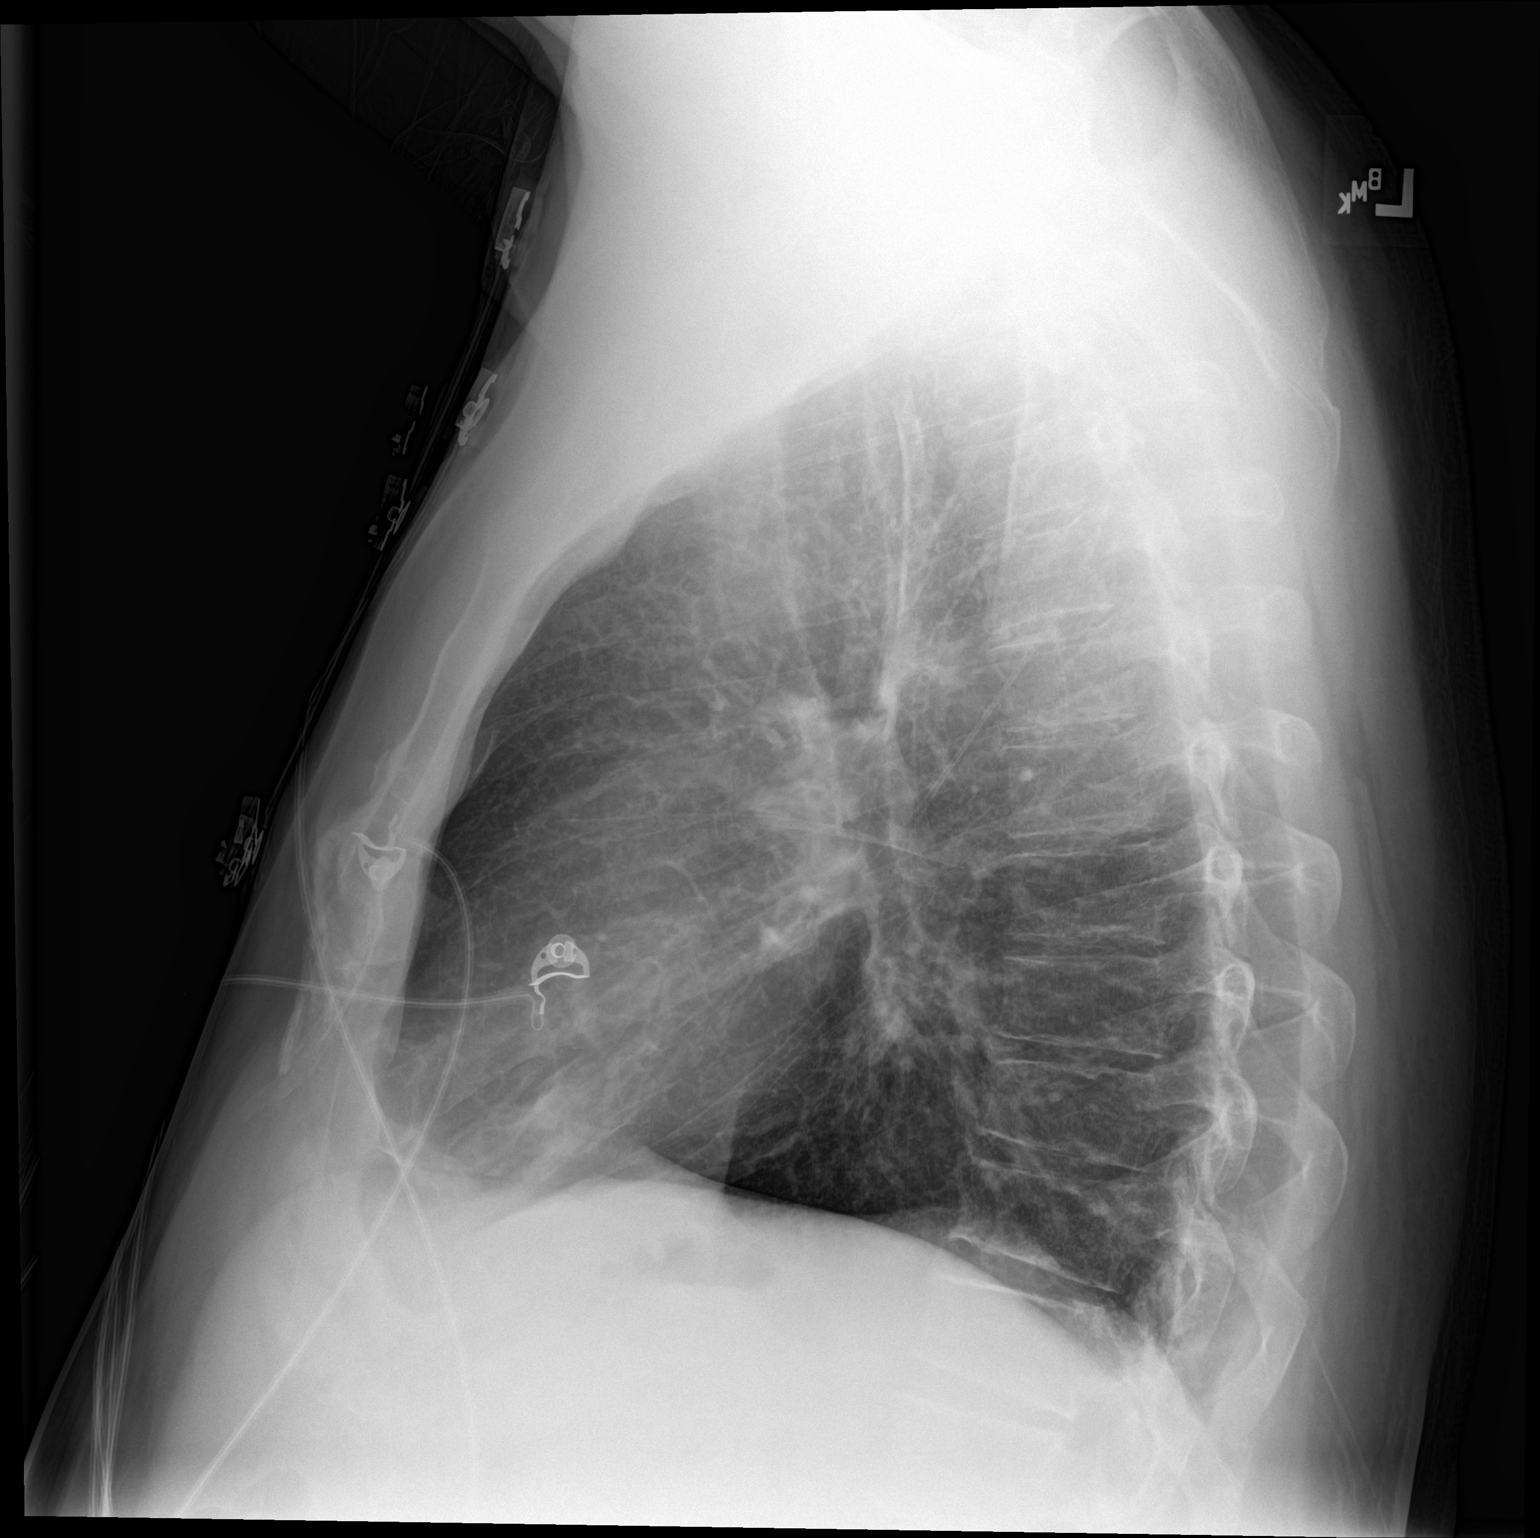

[2 of 2 positions shown; findings below may reference images not displayed]

FINDINGS: The lungs are clear. Heart size is normal. No pneumothorax or
pleural effusion. No focal bony abnormality.
IMPRESSION: No acute disease.

## 2017-04-21 IMAGING — CT CT HEAD W/O CM
1 series · 16 of 30 positions shown, 20 images · non-contrast
Comparison: None.

CLINICAL DATA: Headache for 3 days.

EXAM:
CT HEAD WITHOUT CONTRAST
TECHNIQUE: Contiguous axial images were obtained from the base of the skull
through the vertex without intravenous contrast.

[Series 2: head wo · axial · 0.46mm/px · z∈[-64,+84]mm · 16 of 36 slices shown, 20 images]
[im 2/36  brain]
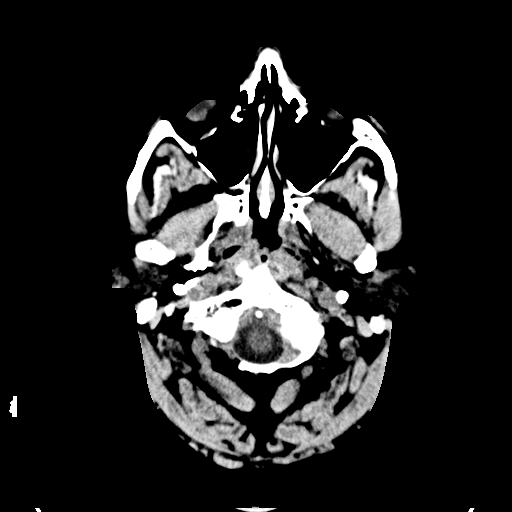
[im 2/36  bone]
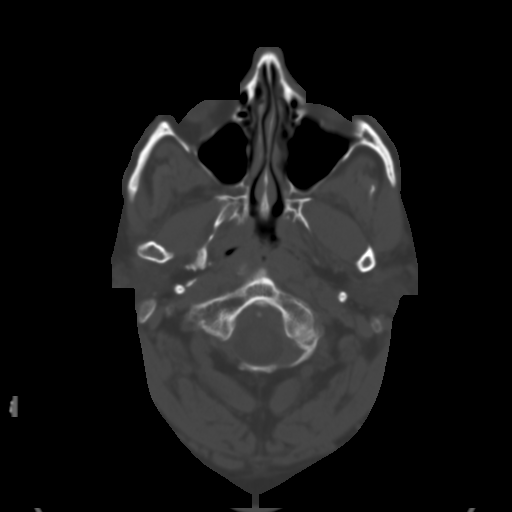
[im 4/36  brain]
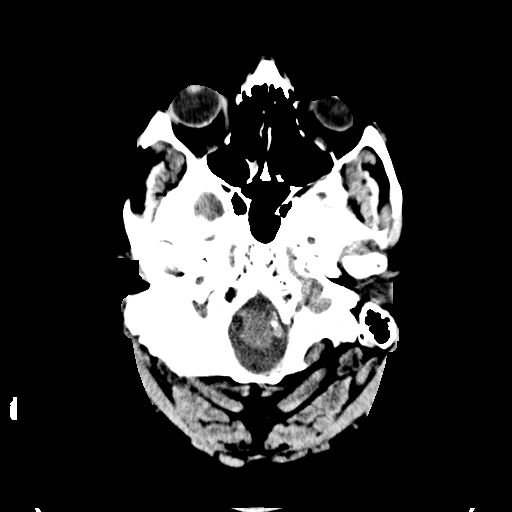
[im 7/36  brain]
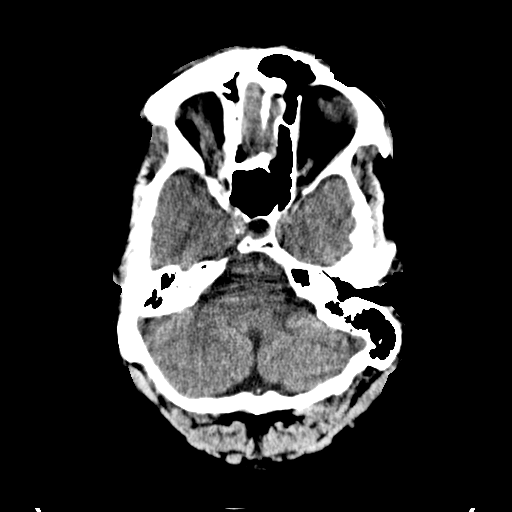
[im 9/36  brain]
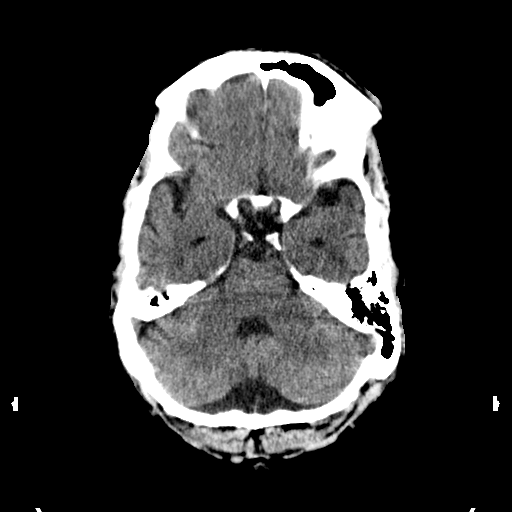
[im 10/36  brain]
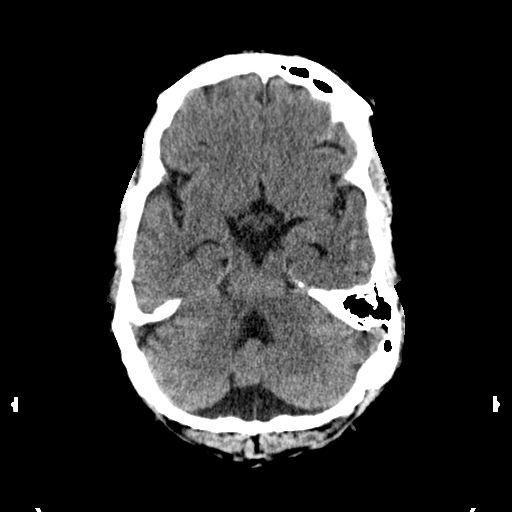
[im 10/36  bone]
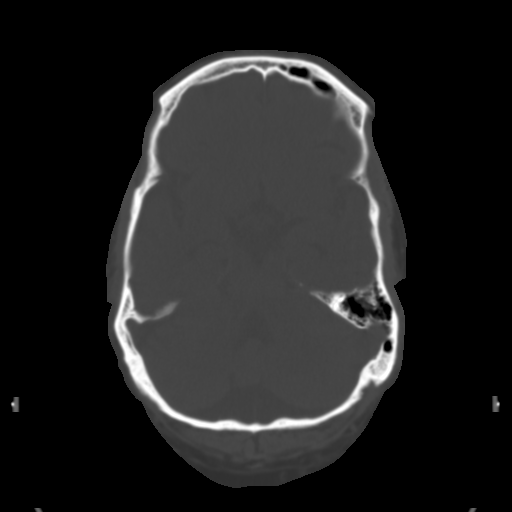
[im 13/36  brain]
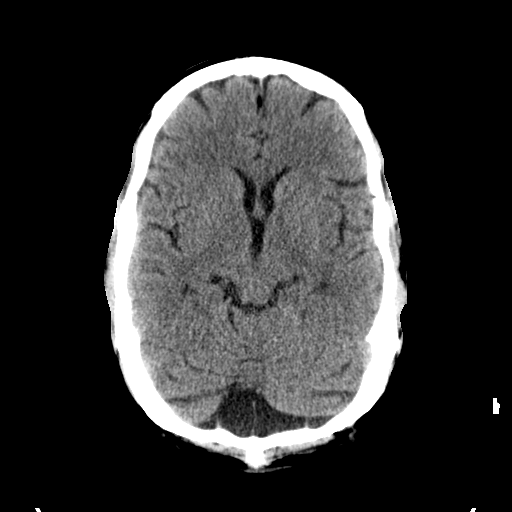
[im 15/36  brain]
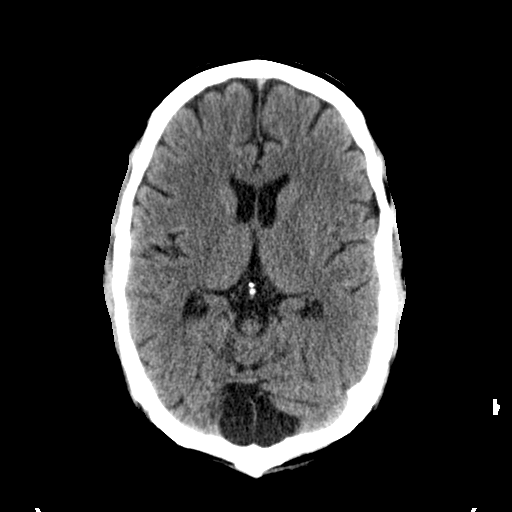
[im 17/36  brain]
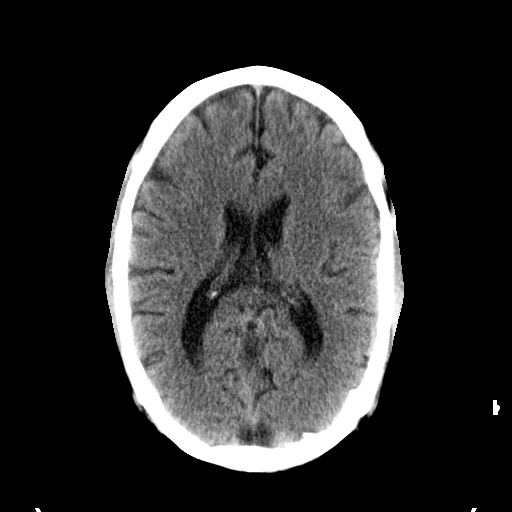
[im 19/36  brain]
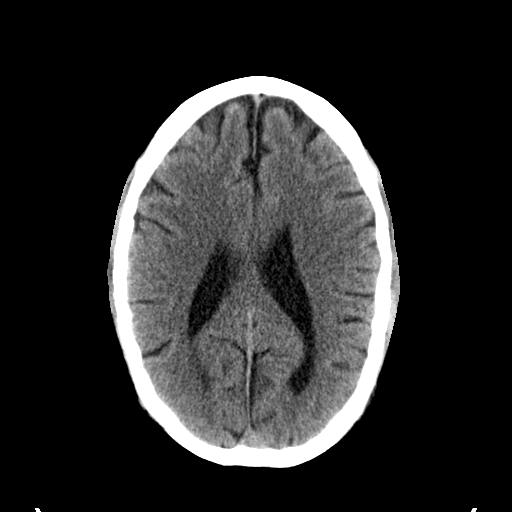
[im 19/36  bone]
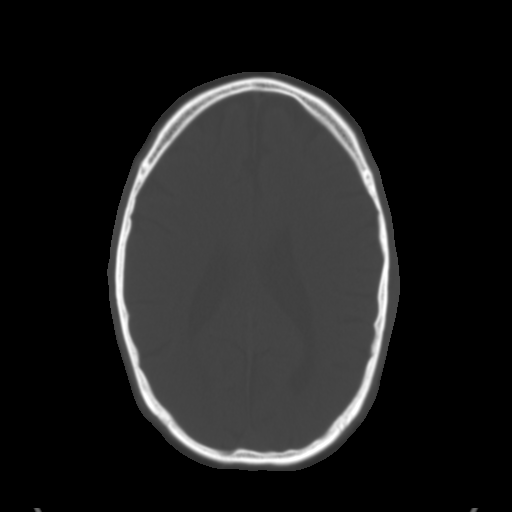
[im 21/36  brain]
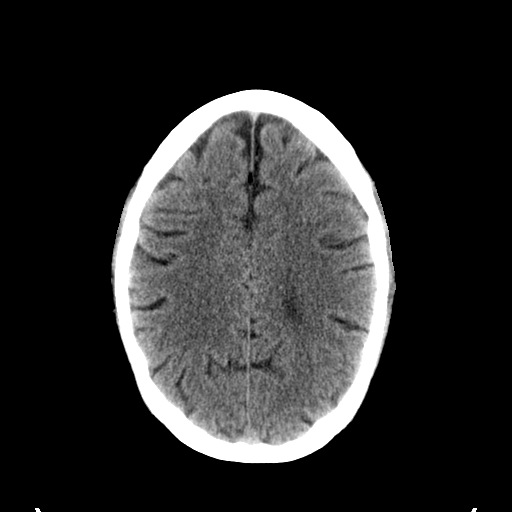
[im 23/36  brain]
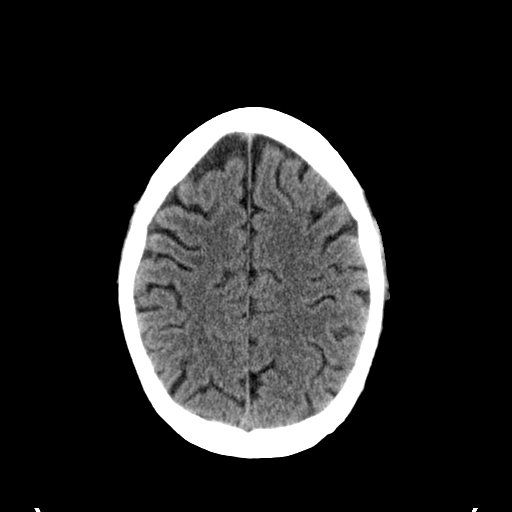
[im 26/36  brain]
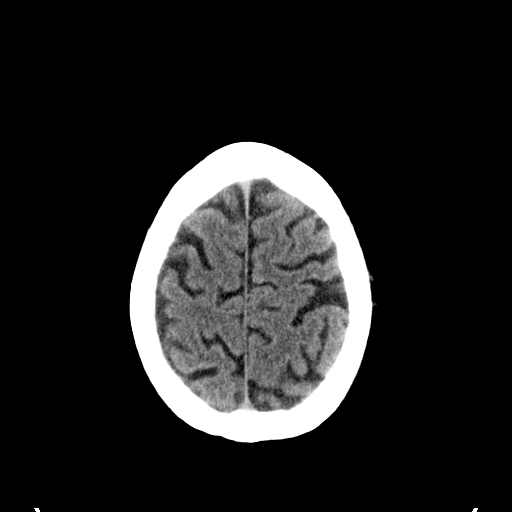
[im 27/36  brain]
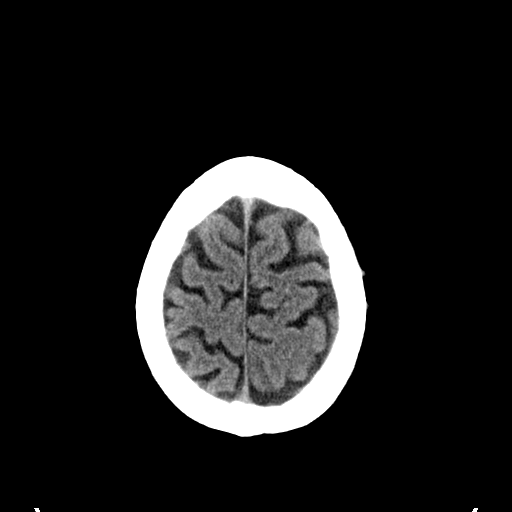
[im 27/36  bone]
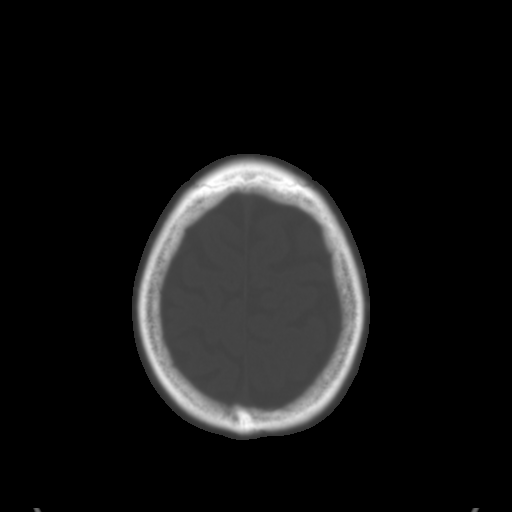
[im 29/36  brain]
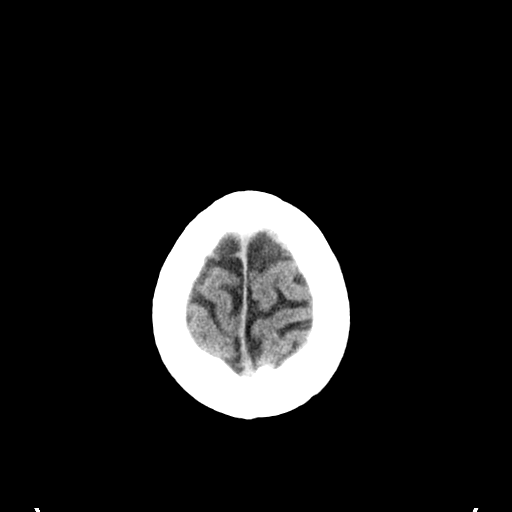
[im 32/36  brain]
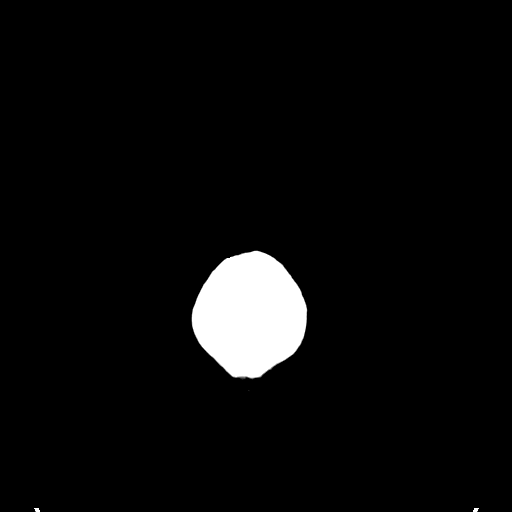
[im 34/36  brain]
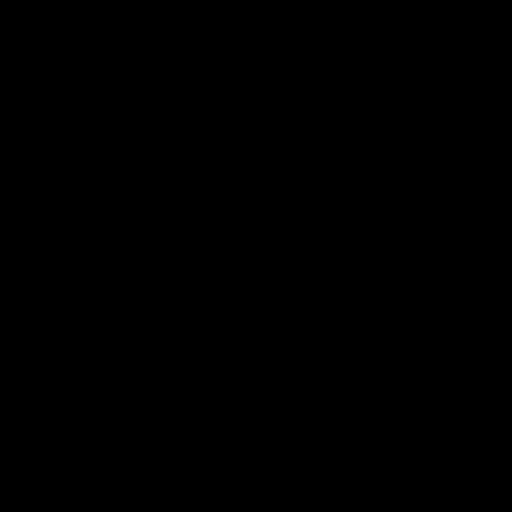

[16 of 30 positions shown; findings below may reference images not displayed]

FINDINGS: Brain: No evidence of acute infarction, hemorrhage, extra-axial
collection, ventriculomegaly, or mass effect. Mild but age advanced
cerebral atrophy. Prominent posterior fossa CSF space may be an
arachnoid cyst or mega cisterna magna.

Vascular: No hyperdense vessel or unexpected calcification.

Skull: Negative for fracture or focal lesion.

Sinuses/Orbits: No acute findings.

Other: None.
IMPRESSION: 1.  No acute intracranial abnormality.
2. Generalized atrophy, mild, however advanced for patient age.

## 2017-10-28 ENCOUNTER — Inpatient Hospital Stay
Admission: AD | Admit: 2017-10-28 | Discharge: 2017-10-29 | DRG: 247 | Disposition: A | Discharge status: Home / Self Care | Payer: Medicare Other | Source: Ambulatory Visit | Attending: Internal Medicine | Admitting: Internal Medicine

## 2017-10-28 ENCOUNTER — Encounter: Payer: Self-pay | Admitting: *Deleted

## 2017-10-28 ENCOUNTER — Other Ambulatory Visit: Payer: Self-pay | Admitting: Cardiovascular Disease

## 2017-10-28 ENCOUNTER — Encounter
Admission: AD | Disposition: A | Discharge status: Home / Self Care | Payer: Self-pay | Source: Ambulatory Visit | Attending: Internal Medicine

## 2017-10-28 ENCOUNTER — Other Ambulatory Visit: Payer: Self-pay

## 2017-10-28 DIAGNOSIS — Z7951 Long term (current) use of inhaled steroids: Secondary | ICD-10-CM | POA: Diagnosis not present

## 2017-10-28 DIAGNOSIS — Z886 Allergy status to analgesic agent status: Secondary | ICD-10-CM

## 2017-10-28 DIAGNOSIS — Z7982 Long term (current) use of aspirin: Secondary | ICD-10-CM | POA: Diagnosis not present

## 2017-10-28 DIAGNOSIS — Z79899 Other long term (current) drug therapy: Secondary | ICD-10-CM

## 2017-10-28 DIAGNOSIS — Z23 Encounter for immunization: Secondary | ICD-10-CM

## 2017-10-28 DIAGNOSIS — M5136 Other intervertebral disc degeneration, lumbar region: Secondary | ICD-10-CM

## 2017-10-28 DIAGNOSIS — I249 Acute ischemic heart disease, unspecified: Secondary | ICD-10-CM | POA: Diagnosis present

## 2017-10-28 DIAGNOSIS — I200 Unstable angina: Secondary | ICD-10-CM | POA: Insufficient documentation

## 2017-10-28 DIAGNOSIS — M5416 Radiculopathy, lumbar region: Secondary | ICD-10-CM

## 2017-10-28 DIAGNOSIS — K219 Gastro-esophageal reflux disease without esophagitis: Secondary | ICD-10-CM | POA: Diagnosis present

## 2017-10-28 DIAGNOSIS — Z7902 Long term (current) use of antithrombotics/antiplatelets: Secondary | ICD-10-CM | POA: Diagnosis not present

## 2017-10-28 DIAGNOSIS — I25110 Atherosclerotic heart disease of native coronary artery with unstable angina pectoris: Secondary | ICD-10-CM | POA: Diagnosis present

## 2017-10-28 DIAGNOSIS — M47816 Spondylosis without myelopathy or radiculopathy, lumbar region: Secondary | ICD-10-CM

## 2017-10-28 DIAGNOSIS — E1122 Type 2 diabetes mellitus with diabetic chronic kidney disease: Secondary | ICD-10-CM | POA: Diagnosis present

## 2017-10-28 DIAGNOSIS — G8929 Other chronic pain: Secondary | ICD-10-CM | POA: Diagnosis present

## 2017-10-28 DIAGNOSIS — Z9889 Other specified postprocedural states: Secondary | ICD-10-CM

## 2017-10-28 DIAGNOSIS — Z880 Allergy status to penicillin: Secondary | ICD-10-CM | POA: Diagnosis not present

## 2017-10-28 DIAGNOSIS — J449 Chronic obstructive pulmonary disease, unspecified: Secondary | ICD-10-CM | POA: Diagnosis present

## 2017-10-28 DIAGNOSIS — I2510 Atherosclerotic heart disease of native coronary artery without angina pectoris: Secondary | ICD-10-CM | POA: Diagnosis present

## 2017-10-28 DIAGNOSIS — N183 Chronic kidney disease, stage 3 (moderate): Secondary | ICD-10-CM | POA: Diagnosis not present

## 2017-10-28 DIAGNOSIS — M48061 Spinal stenosis, lumbar region without neurogenic claudication: Secondary | ICD-10-CM

## 2017-10-28 DIAGNOSIS — Z794 Long term (current) use of insulin: Secondary | ICD-10-CM | POA: Diagnosis not present

## 2017-10-28 DIAGNOSIS — M533 Sacrococcygeal disorders, not elsewhere classified: Secondary | ICD-10-CM

## 2017-10-28 DIAGNOSIS — I129 Hypertensive chronic kidney disease with stage 1 through stage 4 chronic kidney disease, or unspecified chronic kidney disease: Secondary | ICD-10-CM | POA: Diagnosis present

## 2017-10-28 DIAGNOSIS — Z87891 Personal history of nicotine dependence: Secondary | ICD-10-CM | POA: Diagnosis not present

## 2017-10-28 HISTORY — PX: LEFT HEART CATH AND CORONARY ANGIOGRAPHY: CATH118249

## 2017-10-28 HISTORY — PX: CORONARY STENT INTERVENTION: CATH118234

## 2017-10-28 LAB — CBC
HEMATOCRIT: 48.2 % (ref 39.0–52.0)
Hemoglobin: 15.4 g/dL (ref 13.0–17.0)
MCH: 28.1 pg (ref 26.0–34.0)
MCHC: 32 g/dL (ref 30.0–36.0)
MCV: 88 fL (ref 80.0–100.0)
NRBC: 0 % (ref 0.0–0.2)
PLATELETS: 178 10*3/uL (ref 150–400)
RBC: 5.48 MIL/uL (ref 4.22–5.81)
RDW: 15.3 % (ref 11.5–15.5)
WBC: 10.9 10*3/uL — AB (ref 4.0–10.5)

## 2017-10-28 LAB — CREATININE, SERUM
Creatinine, Ser: 1.37 mg/dL — ABNORMAL HIGH (ref 0.61–1.24)
GFR, EST NON AFRICAN AMERICAN: 59 mL/min — AB (ref >60)

## 2017-10-28 LAB — GLUCOSE, CAPILLARY
GLUCOSE-CAPILLARY: 142 mg/dL — AB (ref 70–99)
Glucose-Capillary: 99 mg/dL (ref 70–99)
Glucose-Capillary: 87 mg/dL (ref 70–99)

## 2017-10-28 LAB — POCT ACTIVATED CLOTTING TIME: ACTIVATED CLOTTING TIME: 543 s

## 2017-10-28 SURGERY — LEFT HEART CATH AND CORONARY ANGIOGRAPHY
Anesthesia: Moderate Sedation

## 2017-10-28 MED ORDER — SODIUM CHLORIDE 0.9 % WEIGHT BASED INFUSION
1.0000 mL/kg/h | INTRAVENOUS | Status: AC
  Administered 2017-10-28: 1 mL/kg/h via INTRAVENOUS

## 2017-10-28 MED ORDER — ACETAMINOPHEN 650 MG RE SUPP: 650.0000 mg | Freq: Four times a day (QID) | RECTAL | Status: DC | PRN

## 2017-10-28 MED ORDER — ACETAMINOPHEN 325 MG PO TABS: 650.0000 mg | ORAL_TABLET | ORAL | Status: DC | PRN

## 2017-10-28 MED ORDER — MIDAZOLAM HCL 2 MG/2ML IJ SOLN
INTRAMUSCULAR | Status: AC
  Filled 2017-10-28: qty 2

## 2017-10-28 MED ORDER — DOXAZOSIN MESYLATE 2 MG PO TABS
2.0000 mg | ORAL_TABLET | Freq: Two times a day (BID) | ORAL | Status: DC
  Administered 2017-10-28 – 2017-10-29 (×2): 2 mg via ORAL
  Filled 2017-10-28 (×3): qty 1

## 2017-10-28 MED ORDER — RANOLAZINE ER 500 MG PO TB12
1000.0000 mg | ORAL_TABLET | Freq: Two times a day (BID) | ORAL | Status: DC
  Administered 2017-10-28 – 2017-10-29 (×2): 1000 mg via ORAL
  Filled 2017-10-28 (×3): qty 2

## 2017-10-28 MED ORDER — SODIUM BICARBONATE BOLUS VIA INFUSION
INTRAVENOUS | Status: DC
  Filled 2017-10-28: qty 1

## 2017-10-28 MED ORDER — NITROGLYCERIN 0.4 MG SL SUBL
0.4000 mg | SUBLINGUAL_TABLET | SUBLINGUAL | Status: DC | PRN
  Administered 2017-10-28: 0.4 mg via SUBLINGUAL

## 2017-10-28 MED ORDER — ONDANSETRON HCL 4 MG PO TABS: 4.0000 mg | ORAL_TABLET | Freq: Four times a day (QID) | ORAL | Status: DC | PRN

## 2017-10-28 MED ORDER — SODIUM CHLORIDE 0.9% FLUSH
3.0000 mL | Freq: Two times a day (BID) | INTRAVENOUS | Status: DC
  Administered 2017-10-28 – 2017-10-29 (×2): 3 mL via INTRAVENOUS

## 2017-10-28 MED ORDER — SODIUM CHLORIDE 0.9% FLUSH: 3.0000 mL | Freq: Two times a day (BID) | INTRAVENOUS | Status: DC

## 2017-10-28 MED ORDER — LABETALOL HCL 5 MG/ML IV SOLN
10.0000 mg | Freq: Once | INTRAVENOUS | Status: AC
  Administered 2017-10-28: 10 mg via INTRAVENOUS

## 2017-10-28 MED ORDER — AMLODIPINE BESYLATE 10 MG PO TABS
10.0000 mg | ORAL_TABLET | Freq: Every day | ORAL | Status: DC
  Administered 2017-10-29: 10 mg via ORAL
  Filled 2017-10-28: qty 1

## 2017-10-28 MED ORDER — PNEUMOCOCCAL VAC POLYVALENT 25 MCG/0.5ML IJ INJ
0.5000 mL | INJECTION | INTRAMUSCULAR | Status: AC
  Administered 2017-10-29: 0.5 mL via INTRAMUSCULAR
  Filled 2017-10-28: qty 0.5

## 2017-10-28 MED ORDER — CALCIUM CARBONATE ANTACID 500 MG PO CHEW
1.0000 | CHEWABLE_TABLET | Freq: Three times a day (TID) | ORAL | Status: DC | PRN
  Administered 2017-10-28: 200 mg via ORAL
  Filled 2017-10-28: qty 1

## 2017-10-28 MED ORDER — GABAPENTIN 400 MG PO CAPS
400.0000 mg | ORAL_CAPSULE | Freq: Three times a day (TID) | ORAL | Status: DC
  Administered 2017-10-28 – 2017-10-29 (×2): 400 mg via ORAL
  Filled 2017-10-28 (×2): qty 1

## 2017-10-28 MED ORDER — SODIUM CHLORIDE 0.9 % IV SOLN
INTRAVENOUS | Status: DC
  Administered 2017-10-28: 12:00:00 via INTRAVENOUS

## 2017-10-28 MED ORDER — ATORVASTATIN CALCIUM 20 MG PO TABS
80.0000 mg | ORAL_TABLET | Freq: Every day | ORAL | Status: DC
  Administered 2017-10-29: 80 mg via ORAL
  Filled 2017-10-28: qty 4

## 2017-10-28 MED ORDER — ICOSAPENT ETHYL 1 G PO CAPS: 2.0000 g | ORAL_CAPSULE | Freq: Two times a day (BID) | ORAL | Status: DC

## 2017-10-28 MED ORDER — MIDAZOLAM HCL 2 MG/2ML IJ SOLN
INTRAMUSCULAR | Status: DC | PRN
  Administered 2017-10-28: 1 mg via INTRAVENOUS

## 2017-10-28 MED ORDER — INSULIN DEGLUDEC 100 UNIT/ML ~~LOC~~ SOPN: 10.0000 [IU] | PEN_INJECTOR | Freq: Every day | SUBCUTANEOUS | Status: DC

## 2017-10-28 MED ORDER — NITROGLYCERIN 1 MG/10 ML FOR IR/CATH LAB
INTRA_ARTERIAL | Status: DC | PRN
  Administered 2017-10-28: 200 ug via INTRACORONARY

## 2017-10-28 MED ORDER — DOXAZOSIN MESYLATE ER 4 MG PO TB24: 4.0000 mg | ORAL_TABLET | Freq: Every day | ORAL | Status: DC

## 2017-10-28 MED ORDER — TICAGRELOR 90 MG PO TABS
ORAL_TABLET | ORAL | Status: AC
  Filled 2017-10-28: qty 2

## 2017-10-28 MED ORDER — IPRATROPIUM-ALBUTEROL 0.5-2.5 (3) MG/3ML IN SOLN: 3.0000 mL | Freq: Four times a day (QID) | RESPIRATORY_TRACT | Status: DC | PRN

## 2017-10-28 MED ORDER — LABETALOL HCL 5 MG/ML IV SOLN
INTRAVENOUS | Status: AC
  Filled 2017-10-28: qty 4

## 2017-10-28 MED ORDER — TICAGRELOR 90 MG PO TABS
ORAL_TABLET | ORAL | Status: DC | PRN
  Administered 2017-10-28: 180 mg via ORAL

## 2017-10-28 MED ORDER — TICAGRELOR 90 MG PO TABS
90.0000 mg | ORAL_TABLET | Freq: Two times a day (BID) | ORAL | Status: DC
  Administered 2017-10-29: 90 mg via ORAL
  Filled 2017-10-28 (×2): qty 1

## 2017-10-28 MED ORDER — FENOFIBRATE 160 MG PO TABS
160.0000 mg | ORAL_TABLET | Freq: Every day | ORAL | Status: DC
  Administered 2017-10-29: 160 mg via ORAL
  Filled 2017-10-28: qty 1

## 2017-10-28 MED ORDER — FENTANYL CITRATE (PF) 100 MCG/2ML IJ SOLN
25.0000 ug | Freq: Once | INTRAMUSCULAR | Status: AC
  Administered 2017-10-28: 25 ug via INTRAVENOUS

## 2017-10-28 MED ORDER — PANTOPRAZOLE SODIUM 40 MG PO TBEC
40.0000 mg | DELAYED_RELEASE_TABLET | Freq: Every day | ORAL | Status: DC
  Administered 2017-10-28 – 2017-10-29 (×2): 40 mg via ORAL
  Filled 2017-10-28 (×2): qty 1

## 2017-10-28 MED ORDER — NITROGLYCERIN 0.4 MG SL SUBL
SUBLINGUAL_TABLET | SUBLINGUAL | Status: AC
  Administered 2017-10-28: 0.4 mg via SUBLINGUAL
  Filled 2017-10-28: qty 1

## 2017-10-28 MED ORDER — ALBUTEROL SULFATE (2.5 MG/3ML) 0.083% IN NEBU: 2.5000 mg | INHALATION_SOLUTION | Freq: Four times a day (QID) | RESPIRATORY_TRACT | Status: DC | PRN

## 2017-10-28 MED ORDER — ASPIRIN 81 MG PO CHEW
CHEWABLE_TABLET | ORAL | Status: AC
  Filled 2017-10-28: qty 3

## 2017-10-28 MED ORDER — BIVALIRUDIN TRIFLUOROACETATE 250 MG IV SOLR
INTRAVENOUS | Status: AC
  Filled 2017-10-28: qty 250

## 2017-10-28 MED ORDER — LINAGLIPTIN 5 MG PO TABS
5.0000 mg | ORAL_TABLET | Freq: Every day | ORAL | Status: DC
  Administered 2017-10-29: 5 mg via ORAL
  Filled 2017-10-28: qty 1

## 2017-10-28 MED ORDER — ASPIRIN 81 MG PO CHEW: 81.0000 mg | CHEWABLE_TABLET | ORAL | Status: DC

## 2017-10-28 MED ORDER — ONDANSETRON HCL 4 MG/2ML IJ SOLN: 4.0000 mg | Freq: Four times a day (QID) | INTRAMUSCULAR | Status: DC | PRN

## 2017-10-28 MED ORDER — HEPARIN (PORCINE) IN NACL 1000-0.9 UT/500ML-% IV SOLN
INTRAVENOUS | Status: AC
  Filled 2017-10-28: qty 1000

## 2017-10-28 MED ORDER — IOPAMIDOL (ISOVUE-300) INJECTION 61%
INTRAVENOUS | Status: DC | PRN
  Administered 2017-10-28: 50 mL via INTRA_ARTERIAL

## 2017-10-28 MED ORDER — SODIUM BICARBONATE 8.4 % IV SOLN
INTRAVENOUS | Status: DC
  Filled 2017-10-28: qty 1000

## 2017-10-28 MED ORDER — SODIUM CHLORIDE 0.9 % IV SOLN: 250.0000 mL | INTRAVENOUS | Status: DC | PRN

## 2017-10-28 MED ORDER — OXYCODONE HCL 5 MG PO TABS
5.0000 mg | ORAL_TABLET | Freq: Three times a day (TID) | ORAL | Status: DC | PRN
  Administered 2017-10-28: 5 mg via ORAL

## 2017-10-28 MED ORDER — SODIUM CHLORIDE 0.9% FLUSH: 3.0000 mL | INTRAVENOUS | Status: DC | PRN

## 2017-10-28 MED ORDER — AMLODIPINE BESY-BENAZEPRIL HCL 10-40 MG PO CAPS: 1.0000 | ORAL_CAPSULE | Freq: Every day | ORAL | Status: DC

## 2017-10-28 MED ORDER — OXYCODONE-ACETAMINOPHEN 5-325 MG PO TABS: 1.0000 | ORAL_TABLET | Freq: Three times a day (TID) | ORAL | Status: DC | PRN

## 2017-10-28 MED ORDER — ACETAMINOPHEN 325 MG PO TABS: 650.0000 mg | ORAL_TABLET | Freq: Four times a day (QID) | ORAL | Status: DC | PRN

## 2017-10-28 MED ORDER — TIZANIDINE HCL 4 MG PO TABS
4.0000 mg | ORAL_TABLET | Freq: Three times a day (TID) | ORAL | Status: DC | PRN
  Administered 2017-10-28: 4 mg via ORAL
  Filled 2017-10-28 (×2): qty 1

## 2017-10-28 MED ORDER — ASPIRIN 81 MG PO CHEW
CHEWABLE_TABLET | ORAL | Status: DC | PRN
  Administered 2017-10-28: 243 mg via ORAL

## 2017-10-28 MED ORDER — FENTANYL CITRATE (PF) 100 MCG/2ML IJ SOLN
INTRAMUSCULAR | Status: AC
  Filled 2017-10-28: qty 2

## 2017-10-28 MED ORDER — INSULIN ASPART 100 UNIT/ML ~~LOC~~ SOLN
0.0000 [IU] | Freq: Three times a day (TID) | SUBCUTANEOUS | Status: DC
  Administered 2017-10-29 (×2): 1 [IU] via SUBCUTANEOUS
  Filled 2017-10-28 (×2): qty 1

## 2017-10-28 MED ORDER — INFLUENZA VAC SPLIT QUAD 0.5 ML IM SUSY
0.5000 mL | PREFILLED_SYRINGE | INTRAMUSCULAR | Status: AC
  Administered 2017-10-29: 0.5 mL via INTRAMUSCULAR
  Filled 2017-10-28: qty 0.5

## 2017-10-28 MED ORDER — NITROGLYCERIN 0.4 MG SL SUBL: 0.4000 mg | SUBLINGUAL_TABLET | SUBLINGUAL | Status: DC | PRN

## 2017-10-28 MED ORDER — GLIPIZIDE 5 MG PO TABS
5.0000 mg | ORAL_TABLET | Freq: Two times a day (BID) | ORAL | Status: DC
  Administered 2017-10-29: 5 mg via ORAL
  Filled 2017-10-28 (×3): qty 1

## 2017-10-28 MED ORDER — SODIUM CHLORIDE 0.9 % IV SOLN
INTRAVENOUS | Status: AC | PRN
  Administered 2017-10-28: 1.75 mg/kg/h via INTRAVENOUS

## 2017-10-28 MED ORDER — POLYETHYLENE GLYCOL 3350 17 G PO PACK: 17.0000 g | PACK | Freq: Every day | ORAL | Status: DC | PRN

## 2017-10-28 MED ORDER — CANAGLIFLOZIN 100 MG PO TABS: 100.0000 mg | ORAL_TABLET | Freq: Every day | ORAL | Status: DC

## 2017-10-28 MED ORDER — VARENICLINE TARTRATE 1 MG PO TABS
0.5000 mg | ORAL_TABLET | Freq: Every day | ORAL | Status: DC
  Filled 2017-10-28 (×2): qty 1

## 2017-10-28 MED ORDER — BENAZEPRIL HCL 20 MG PO TABS
40.0000 mg | ORAL_TABLET | Freq: Every day | ORAL | Status: DC
  Administered 2017-10-29: 40 mg via ORAL
  Filled 2017-10-28: qty 2

## 2017-10-28 MED ORDER — METOPROLOL SUCCINATE ER 25 MG PO TB24
25.0000 mg | ORAL_TABLET | Freq: Every day | ORAL | Status: DC
  Administered 2017-10-28 – 2017-10-29 (×2): 25 mg via ORAL
  Filled 2017-10-28 (×2): qty 1

## 2017-10-28 MED ORDER — NITROGLYCERIN 5 MG/ML IV SOLN
INTRAVENOUS | Status: AC
  Filled 2017-10-28: qty 10

## 2017-10-28 MED ORDER — FENTANYL CITRATE (PF) 100 MCG/2ML IJ SOLN
INTRAMUSCULAR | Status: DC | PRN
  Administered 2017-10-28: 50 ug via INTRAVENOUS

## 2017-10-28 MED ORDER — ISOSORBIDE MONONITRATE ER 60 MG PO TB24
60.0000 mg | ORAL_TABLET | Freq: Two times a day (BID) | ORAL | Status: DC
  Administered 2017-10-28 – 2017-10-29 (×2): 60 mg via ORAL
  Filled 2017-10-28 (×2): qty 1

## 2017-10-28 MED ORDER — IOPAMIDOL (ISOVUE-300) INJECTION 61%
INTRAVENOUS | Status: DC | PRN
  Administered 2017-10-28: 100 mL via INTRA_ARTERIAL

## 2017-10-28 MED ORDER — UMECLIDINIUM BROMIDE 62.5 MCG/INH IN AEPB
1.0000 | INHALATION_SPRAY | Freq: Every day | RESPIRATORY_TRACT | Status: DC
  Administered 2017-10-29: 1 via RESPIRATORY_TRACT
  Filled 2017-10-28: qty 7

## 2017-10-28 MED ORDER — PREGABALIN 75 MG PO CAPS: 75.0000 mg | ORAL_CAPSULE | Freq: Three times a day (TID) | ORAL | Status: DC

## 2017-10-28 MED ORDER — OXYCODONE HCL 5 MG PO TABS
ORAL_TABLET | ORAL | Status: AC
  Administered 2017-10-28: 5 mg via ORAL
  Filled 2017-10-28: qty 1

## 2017-10-28 MED ORDER — INSULIN GLARGINE 100 UNIT/ML ~~LOC~~ SOLN
10.0000 [IU] | Freq: Every day | SUBCUTANEOUS | Status: DC
  Administered 2017-10-28: 10 [IU] via SUBCUTANEOUS
  Filled 2017-10-28 (×2): qty 0.1

## 2017-10-28 MED ORDER — BIVALIRUDIN BOLUS VIA INFUSION - CUPID
INTRAVENOUS | Status: DC | PRN
  Administered 2017-10-28: 68.7 mg via INTRAVENOUS

## 2017-10-28 MED ORDER — ASPIRIN EC 81 MG PO TBEC
81.0000 mg | DELAYED_RELEASE_TABLET | Freq: Every day | ORAL | Status: DC
  Administered 2017-10-29: 81 mg via ORAL
  Filled 2017-10-28: qty 1

## 2017-10-28 SURGICAL SUPPLY — 17 items
BALLN MINITREK RX 2.0X12 (BALLOONS) ×3
BALLOON MINITREK RX 2.0X12 (BALLOONS) ×2 IMPLANT
CATH INFINITI 5FR ANG PIGTAIL (CATHETERS) ×3 IMPLANT
CATH INFINITI 5FR JL4 (CATHETERS) ×3 IMPLANT
CATH INFINITI JR4 5F (CATHETERS) ×3 IMPLANT
CATH VISTA GUIDE 6FR XB3.5 (CATHETERS) ×3 IMPLANT
DEVICE CLOSURE MYNXGRIP 6/7F (Vascular Products) ×3 IMPLANT
DEVICE INFLAT 30 PLUS (MISCELLANEOUS) ×3 IMPLANT
DEVICE SAFEGUARD 24CM (GAUZE/BANDAGES/DRESSINGS) ×3 IMPLANT
KIT MANI 3VAL PERCEP (MISCELLANEOUS) ×3 IMPLANT
NEEDLE PERC 18GX7CM (NEEDLE) ×3 IMPLANT
PACK CARDIAC CATH (CUSTOM PROCEDURE TRAY) ×3 IMPLANT
SHEATH AVANTI 5FR X 11CM (SHEATH) ×3 IMPLANT
SHEATH AVANTI 6FR X 11CM (SHEATH) ×3 IMPLANT
STENT RESOLUTE ONYX 2.5X15 (Permanent Stent) ×3 IMPLANT
WIRE GUIDERIGHT .035X150 (WIRE) ×3 IMPLANT
WIRE RUNTHROUGH .014X180CM (WIRE) ×3 IMPLANT

## 2017-10-28 NOTE — Progress Notes (Signed)
Pt complaining at 1445 of new sharp pain in left chest radiating down left arm. EKg completed no acute changes from post Cath EKG. Mild HTN otherwise VSS. Dr. Fletcher Anon notified of Pt status, VS  and EKG provided for review. NTG SL ordered and Fentanyl 25 mcg IVP x 1 if needed. NTG given STAT. Will monitor closely.

## 2017-10-28 NOTE — Progress Notes (Signed)
Talked to Dr. Jerelyn Charles about patient's home medication, patient states he does not take Lyrica anymore, but takes gabapentin 400 mg tid instead, order place. RN will continue to monitor.

## 2017-10-28 NOTE — Progress Notes (Signed)
Sodium Bicarb for CIN prevention per pharmacy consult:  Order set entered and Sodium bicarb drip ordered per weight of 91.6 kg  Called Cath Lab and patient is in Cath Lab at this time  Chinita Greenland PharmD Clinical Pharmacist 10/28/2017 12:41 PM

## 2017-10-28 NOTE — Progress Notes (Signed)
Angiomax Per Pharmacy consult  Angiomax initiated in Cath Lab.  Drip to be continued at 0.2 mg/kg/h x 4 hours. Called Cath Lab and spoke with [facility]. Angiomax drip has been decreased and should be discontinued at 1710 per RN.  Chinita Greenland PharmD Clinical Pharmacist 10/28/2017

## 2017-10-28 NOTE — H&P (Signed)
[facility] [place] NAME: Jose Carroll    MR#:  [id number]  DATE OF BIRTH:  [birth year]  DATE OF ADMISSION:  10/28/2017  PRIMARY CARE PHYSICIAN: Danelle Berry, NP   REQUESTING/REFERRING PHYSICIAN: Dr. Neoma Laming  CHIEF COMPLAINT:   Patient is status post angioplasty and stent placement. Has worsening renal function. HISTORY OF PRESENT ILLNESS:  Jose Carroll  is a 52 y.o. male with a known history of COPD, coronary artery disease, diabetes, Jerrye Bushy, chronic kidney disease stage II/3 with baseline creatinine around 1.43 was admitted as elective cardiac catheterization for ongoing shortness of breath and chest pain  It underwent cardiac cath by Dr. Yancey Flemings found to have 95% distal RCA blockage. Dr. Fletcher Anon repeated cath and PCI with drug-eluting stent was placed. Patient's creatinine was 51.59. Dr. Yancey Flemings recommends IV fluids, IV bicarb drip, nephrology consultation and follow-up on patient's creatinine.  Patient was seen in specials recovery. Denies any complaints. Does not have any chest pain.  PAST MEDICAL HISTORY:   Past Medical History:  Diagnosis Date  . Anginal pain ([place])   . Arthritis   . COPD (chronic obstructive pulmonary disease) ([place])   . Coronary artery disease   . Diabetes mellitus without complication ([place])   . GERD (gastroesophageal reflux disease)   . Hypertension    dr Humphrey Rolls   [place]  . Shortness of breath     PAST SURGICAL HISTOIRY:   Past Surgical History:  Procedure Laterality Date  . BACK SURGERY     x3  . CARDIAC CATHETERIZATION N/A 09/10/2014   Procedure: Left Heart Cath and Coronary Angiogram ;  Surgeon: Dionisio David, MD;  Location: [place] [facility];  Service: Cardiovascular;  Laterality: N/A;  . CARDIAC CATHETERIZATION Left 06/26/2015   Procedure: Left Heart Cath and Coronary Angiography;  Surgeon: Dionisio David, MD;  Location: [place] [facility];  Service: Cardiovascular;  Laterality:  Left;  . FETAL SURGERY FOR CONGENITAL HERNIA  2012  . FINGER SURGERY     sixth finger removed  . HERNIA REPAIR    . SUBCLAVIAN ARTERY STENT      SOCIAL HISTORY:   Social History   Tobacco Use  . Smoking status: Former Smoker    Packs/day: 1.00    Years: 25.00    Pack years: 25.00    Types: Cigarettes    Last attempt to quit: 07/04/2015    Years since quitting: 2.3  . Smokeless tobacco: Former Systems developer    Quit date: 07/01/2015  Substance Use Topics  . Alcohol use: No    Alcohol/week: 0.0 standard drinks    FAMILY HISTORY:   Family History  Problem Relation Age of Onset  . Cancer Mother   . Diabetes Mother   . Anuerysm Father     DRUG ALLERGIES:   Allergies  Allergen Reactions  . Naproxen Rash  . Penicillins Hives and Other (See Comments)    Has patient had a PCN reaction causing immediate rash, facial/tongue/throat swelling, SOB or lightheadedness with hypotension: No Has patient had a PCN reaction causing severe rash involving mucus membranes or skin necrosis: No Has patient had a PCN reaction that required hospitalization No Has patient had a PCN reaction occurring within the last 10 years: No If all of the above answers are "NO", then may proceed with Cephalosporin use.    REVIEW OF SYSTEMS:  Review of Systems  Constitutional: Negative for chills, fever and weight loss.  HENT: Negative for  ear discharge, ear pain and nosebleeds.   Eyes: Negative for blurred vision, pain and discharge.  Respiratory: Negative for sputum production, shortness of breath, wheezing and stridor.   Cardiovascular: Positive for chest pain. Negative for palpitations, orthopnea and PND.  Gastrointestinal: Negative for abdominal pain, diarrhea, nausea and vomiting.  Genitourinary: Negative for frequency and urgency.  Musculoskeletal: Negative for back pain and joint pain.  Neurological: Negative for sensory change, speech change, focal weakness and weakness.  Psychiatric/Behavioral:  Negative for depression and hallucinations. The patient is not nervous/anxious.      MEDICATIONS AT HOME:   Prior to Admission medications   Medication Sig Start Date End Date Taking? Authorizing Provider  albuterol (PROVENTIL HFA;VENTOLIN HFA) 108 (90 BASE) MCG/ACT inhaler Inhale 2 puffs into the lungs every 6 (six) hours as needed for wheezing or shortness of breath.    Yes [provider]  amLODipine-benazepril (LOTREL) 10-40 MG per capsule Take 1 capsule by mouth daily.   Yes [provider]  aspirin EC 81 MG tablet Take 81 mg by mouth daily.   Yes [provider]  atorvastatin (LIPITOR) 80 MG tablet Take 80 mg by mouth daily.   Yes [provider]  clopidogrel (PLAVIX) 75 MG tablet Take 75 mg by mouth daily.   Yes [provider]  doxazosin (CARDURA XL) 4 MG 24 hr tablet Take 4 mg by mouth daily with breakfast.   Yes [provider]  empagliflozin (JARDIANCE) 25 MG TABS tablet Take 25 mg by mouth daily.   Yes [provider]  fenofibrate (TRICOR) 145 MG tablet Take 145 mg by mouth daily.   Yes [provider]  glipiZIDE (GLUCOTROL) 5 MG tablet Take 5 mg by mouth 2 (two) times daily before a meal.   Yes [provider]  Icosapent Ethyl (VASCEPA) 1 g CAPS Take 2 g by mouth 2 (two) times daily.   Yes [provider]  Insulin Degludec (TRESIBA FLEXTOUCH) 100 UNIT/ML SOPN Inject 10 Units into the skin at bedtime.   Yes [provider]  ipratropium-albuterol (DUONEB) 0.5-2.5 (3) MG/3ML SOLN Take 3 mLs by nebulization.   Yes [provider]  isosorbide mononitrate (IMDUR) 60 MG 24 hr tablet Take 60 mg by mouth 2 (two) times daily.   Yes [provider]  nitroGLYCERIN (NITROSTAT) 0.4 MG SL tablet Place 0.4 mg under the tongue every 5 (five) minutes as needed for chest pain.   Yes [provider]  oxyCODONE ER (XTAMPZA ER) 9 MG C12A Take by mouth.   Yes [provider]  pregabalin (LYRICA) 75 MG capsule Take 75 mg by mouth every 8 (eight) hours.   Yes [provider]  ranolazine (RANEXA) 1000 MG SR tablet Take 1,000 mg by mouth 2 (two) times daily.   Yes [provider]  tiZANidine (ZANAFLEX) 4 MG tablet Take 4 mg by mouth every 8 (eight) hours as needed for muscle spasms.   Yes [provider]  Vorapaxar Sulfate (ZONTIVITY) 2.08 MG TABS Take by mouth.   Yes [provider]  dexlansoprazole (DEXILANT) 60 MG capsule Take 60 mg by mouth daily.     [provider]  HYDROcodone-acetaminophen (VICODIN) 5-325 mg TABS tablet Limit one half to one tablet by mouth per day or twice per day if tolerated Patient not taking: Reported on 10/28/2017 09/03/15   Mohammed Kindle, MD  metFORMIN (GLUCOPHAGE) 1000 MG tablet Take 1,000 mg by mouth 2 (two) times daily with a meal.  [provider]  metoprolol succinate (TOPROL-XL) 25 MG 24 hr tablet Take 25 mg by mouth daily.    [provider]  sitaGLIPtin (JANUVIA) 100 MG tablet Take 100 mg by mouth daily. Reported on 07/22/2015    [provider]  umeclidinium bromide (INCRUSE ELLIPTA) 62.5 MCG/INH AEPB Inhale 1 puff into the lungs daily.    [provider]  varenicline (CHANTIX) 0.5 MG tablet Take 0.5 mg by mouth daily. Reported on 07/22/2015    [provider]      VITAL SIGNS:  Blood pressure (!) 157/110, pulse 73, temperature (!) 97.5 F (36.4 C), temperature source Oral, resp. rate 13, height 5\' 10"  (1.778 m), weight 91.6 kg, SpO2 98 %.  PHYSICAL EXAMINATION:  GENERAL:  52 y.o.-year-old patient lying in the bed with no acute distress.  EYES: Pupils equal, round, reactive to light and accommodation. No scleral icterus. Extraocular muscles intact.  HEENT: Head atraumatic, normocephalic. Oropharynx and nasopharynx clear.  NECK:  Supple, no jugular venous distention. No thyroid enlargement, no tenderness.  LUNGS: Normal breath sounds  bilaterally, no wheezing, rales,rhonchi or crepitation. No use of accessory muscles of respiration.  CARDIOVASCULAR: S1, S2 normal. No murmurs, rubs, or gallops.  ABDOMEN: Soft, nontender, nondistended. Bowel sounds present. No organomegaly or mass.  EXTREMITIES: No pedal edema, cyanosis, or clubbing.  NEUROLOGIC: Cranial nerves II through XII are intact. Muscle strength 5/5 in all extremities. Sensation intact. Gait not checked.  PSYCHIATRIC: The patient is alert and oriented x 3.  SKIN: No obvious rash, lesion, or ulcer.   LABORATORY PANEL:   CBC No results for input(s): WBC, HGB, HCT, PLT in the last 168 hours. ------------------------------------------------------------------------------------------------------------------  Chemistries  No results for input(s): NA, K, CL, CO2, GLUCOSE, BUN, CREATININE, CALCIUM, MG, AST, ALT, ALKPHOS, BILITOT in the last 168 hours.  Invalid input(s): GFRCGP ------------------------------------------------------------------------------------------------------------------  Cardiac Enzymes No results for input(s): TROPONINI in the last 168 hours. ------------------------------------------------------------------------------------------------------------------  RADIOLOGY:  No results found.  EKG:    IMPRESSION AND PLAN:   Jose Carroll  is a 52 y.o. male with a known history of COPD, coronary artery disease, diabetes, Jerrye Bushy, chronic kidney disease stage II/3 with baseline creatinine around 1.43 was admitted as elective cardiac catheterization for ongoing shortness of breath and chest pain  1. acute on chronic kidney disease stage II/III -admit to telemetry floor -IV fluids,, IV bicarb drip -baseline creatinine 1.43 -came in with creatinine of 1.59--- IV fluid and bicarb drip-- metabolic panel in the morning -nephrology consultation with Dr. Candiss Norse  2. CAD  -patient underwent cardiac cath today with drug-eluting stent placement in distal RCA  by Dr. Fletcher Anon -new further heart meds per Dr. Sharol Roussel recommendation  3. Type II diabetes -scale insulin and home meds  4. Hypertension continue home meds  5. DVT prophylaxis subcu Lovenox     All the records are reviewed and case discussed with ED provider. Management plans discussed with the patient, family and they are in agreement.  CODE STATUS: full  TOTAL TIME TAKING CARE OF THIS PATIENT: *50* minutes.    Fritzi Mandes M.D on 10/28/2017 at 2:32 PM  Between 7am to 6pm - Pager - [phone]  After 6pm go to [email or web address] - password [email or web address]  [facility]  Office  [phone]  CC: Primary care physician; Danelle Berry, NP

## 2017-10-28 NOTE — Consult Note (Signed)
Jose Carroll is a 52 y.o. male  [id number]  Primary Cardiologist: Neoma Laming Reason for Consultation: Acute coronary syndrome  HPI: This is a 52 year old white male with a past medical history of coronary artery disease presented to the hospital with chest pain after being seen in the office at 10 AM this morning.  Is been using 2-3 nitros a day for the past couple of days because of chest pain and presented to the office with a 3 out of 10 chest pain associated with shortness of breath.  EKG was done which showed a diffuse ST depression inferolaterally as well as anteriorly and it was decided that patient would undergo cardiac catheterization with diagnosis of acute coronary syndrome.   Review of Systems: No orthopnea PND or leg swelling   Past Medical History:  Diagnosis Date  . Anginal pain ([place])   . Arthritis   . COPD (chronic obstructive pulmonary disease) ([place])   . Coronary artery disease   . Diabetes mellitus without complication ([place])   . GERD (gastroesophageal reflux disease)   . Hypertension    dr Humphrey Rolls   [place]  . Shortness of breath     Facility-Administered Medications Prior to Admission  Medication Dose Route Frequency Provider Last Rate Last Dose  . fentaNYL (SUBLIMAZE) injection 100 mcg  100 mcg Intravenous Once Mohammed Kindle, MD       Medications Prior to Admission  Medication Sig Dispense Refill  . albuterol (PROVENTIL HFA;VENTOLIN HFA) 108 (90 BASE) MCG/ACT inhaler Inhale 2 puffs into the lungs every 6 (six) hours as needed for wheezing or shortness of breath.     Marland Kitchen amLODipine-benazepril (LOTREL) 10-40 MG per capsule Take 1 capsule by mouth daily.    Marland Kitchen aspirin EC 81 MG tablet Take 81 mg by mouth daily.    Marland Kitchen atorvastatin (LIPITOR) 80 MG tablet Take 80 mg by mouth daily.    . clopidogrel (PLAVIX) 75 MG tablet Take 75 mg by mouth daily.    Marland Kitchen doxazosin (CARDURA XL) 4 MG 24 hr tablet Take 4 mg by mouth daily with breakfast.    . empagliflozin  (JARDIANCE) 25 MG TABS tablet Take 25 mg by mouth daily.    . fenofibrate (TRICOR) 145 MG tablet Take 145 mg by mouth daily.    Marland Kitchen glipiZIDE (GLUCOTROL) 5 MG tablet Take 5 mg by mouth 2 (two) times daily before a meal.    . Icosapent Ethyl (VASCEPA) 1 g CAPS Take 2 g by mouth 2 (two) times daily.    . Insulin Degludec (TRESIBA FLEXTOUCH) 100 UNIT/ML SOPN Inject 10 Units into the skin at bedtime.    Marland Kitchen ipratropium-albuterol (DUONEB) 0.5-2.5 (3) MG/3ML SOLN Take 3 mLs by nebulization.    . isosorbide mononitrate (IMDUR) 60 MG 24 hr tablet Take 60 mg by mouth 2 (two) times daily.    . nitroGLYCERIN (NITROSTAT) 0.4 MG SL tablet Place 0.4 mg under the tongue every 5 (five) minutes as needed for chest pain.    Marland Kitchen oxyCODONE ER (XTAMPZA ER) 9 MG C12A Take by mouth.    . pregabalin (LYRICA) 75 MG capsule Take 75 mg by mouth every 8 (eight) hours.    . ranolazine (RANEXA) 1000 MG SR tablet Take 1,000 mg by mouth 2 (two) times daily.    Marland Kitchen tiZANidine (ZANAFLEX) 4 MG tablet Take 4 mg by mouth every 8 (eight) hours as needed for muscle spasms.    . Vorapaxar Sulfate (ZONTIVITY) 2.08 MG TABS Take by mouth.    Marland Kitchen  dexlansoprazole (DEXILANT) 60 MG capsule Take 60 mg by mouth daily.     Marland Kitchen HYDROcodone-acetaminophen (VICODIN) 5-325 mg TABS tablet Limit one half to one tablet by mouth per day or twice per day if tolerated (Patient not taking: Reported on 10/28/2017) 50 tablet 0  . metFORMIN (GLUCOPHAGE) 1000 MG tablet Take 1,000 mg by mouth 2 (two) times daily with a meal.    . metoprolol succinate (TOPROL-XL) 25 MG 24 hr tablet Take 25 mg by mouth daily.    . sitaGLIPtin (JANUVIA) 100 MG tablet Take 100 mg by mouth daily. Reported on 07/22/2015    . umeclidinium bromide (INCRUSE ELLIPTA) 62.5 MCG/INH AEPB Inhale 1 puff into the lungs daily.    . varenicline (CHANTIX) 0.5 MG tablet Take 0.5 mg by mouth daily. Reported on 07/22/2015       . sodium chloride flush  3 mL Intravenous Q12H  . ticagrelor  90 mg Oral BID     Infusions: . sodium chloride    . sodium chloride      Allergies  Allergen Reactions  . Naproxen Rash  . Penicillins Hives and Other (See Comments)    Has patient had a PCN reaction causing immediate rash, facial/tongue/throat swelling, SOB or lightheadedness with hypotension: No Has patient had a PCN reaction causing severe rash involving mucus membranes or skin necrosis: No Has patient had a PCN reaction that required hospitalization No Has patient had a PCN reaction occurring within the last 10 years: No If all of the above answers are "NO", then may proceed with Cephalosporin use.    Social History   Socioeconomic History  . Marital status: Divorced    Spouse name: Not on file  . Number of children: Not on file  . Years of education: Not on file  . Highest education level: Not on file  Occupational History  . Not on file  Social Needs  . Financial resource strain: Not on file  . Food insecurity:    Worry: Not on file    Inability: Not on file  . Transportation needs:    Medical: Not on file    Non-medical: Not on file  Tobacco Use  . Smoking status: Former Smoker    Packs/day: 1.00    Years: 25.00    Pack years: 25.00    Types: Cigarettes    Last attempt to quit: 07/04/2015    Years since quitting: 2.3  . Smokeless tobacco: Former Systems developer    Quit date: 07/01/2015  Substance and Sexual Activity  . Alcohol use: No    Alcohol/week: 0.0 standard drinks  . Drug use: No  . Sexual activity: Not on file  Lifestyle  . Physical activity:    Days per week: Not on file    Minutes per session: Not on file  . Stress: Not on file  Relationships  . Social connections:    Talks on phone: Not on file    Gets together: Not on file    Attends religious service: Not on file    Active member of club or organization: Not on file    Attends meetings of clubs or organizations: Not on file    Relationship status: Not on file  . Intimate partner violence:    Fear of current  or ex partner: Not on file    Emotionally abused: Not on file    Physically abused: Not on file    Forced sexual activity: Not on file  Other Topics Concern  . Not  on file  Social History Narrative  . Not on file    Family History  Problem Relation Age of Onset  . Cancer Mother   . Diabetes Mother   . Anuerysm Father     PHYSICAL EXAM: Vitals:   10/28/17 1345 10/28/17 1400  BP: (!) 151/92 (!) 138/95  Pulse: 77 77  Resp: (!) 22 13  Temp:    SpO2: 98% 98%    No intake or output data in the 24 hours ending 10/28/17 1417  General:  Well appearing. No respiratory difficulty HEENT: normal Neck: supple. no JVD. Carotids 2+ bilat; no bruits. No lymphadenopathy or thryomegaly appreciated. Cor: PMI nondisplaced. Regular rate & rhythm. No rubs, gallops or murmurs. Lungs: clear Abdomen: soft, nontender, nondistended. No hepatosplenomegaly. No bruits or masses. Good bowel sounds. Extremities: no cyanosis, clubbing, rash, edema Neuro: alert & oriented x 3, cranial nerves grossly intact. moves all 4 extremities w/o difficulty. Affect pleasant.  ECG: Normal sinus rhythm with diffuse ST depression about 2 to 3 mm diffusely  Results for orders placed or performed during the hospital encounter of 10/28/17 (from the past 24 hour(s))  Glucose, capillary     Status: None   Collection Time: 10/28/17 11:31 AM  Result Value Ref Range   Glucose-Capillary 99 70 - 99 mg/dL  POCT Activated clotting time     Status: None   Collection Time: 10/28/17  1:06 PM  Result Value Ref Range   Activated Clotting Time 543 seconds   No results found.   ASSESSMENT AND PLAN: Acute coronary syndrome with cardiac catheterization done urgently revealed 95% lesion in the distal left circumflex after the first obtuse marginal which is giving collaterals to occluded mid RCA.  LAD had no significant disease.  Left ventricular ejection fraction was normal about 60% with mild inferobasal hypokinesis.  Patient  underwent PCI with drug-eluting stent in the distal left circumflex and doing very well.  His creatinine was 1.59 and would get bicarb drip as well as IV fluid and get renal consult.  Advise discharge in the a.m. on current medications plus Brilinta and follow-up in the office on Monday at 10:00.  Christianjames Soule A

## 2017-10-28 NOTE — Progress Notes (Signed)
Text page prime MD about patient's c/o of heart burn, order for TUMS given. RN will continue to monitor.

## 2017-10-28 NOTE — Progress Notes (Signed)
Pt denies pain in left arm at this time following meds. Bp remains elevated. Dr. Fletcher Anon notified. Labetalol 10 mg IVP ordered x 1 . Will continue to monitor closely.

## 2017-10-28 NOTE — Progress Notes (Signed)
Per Dr. Fletcher Anon, ok for Pt to sit up once Angio max gtt is turned off. Per Cath lab staff reduced rate started at 1310 will remain for 4 hrs per order. Pt sit up time and gtt off at 1710.

## 2017-10-29 DIAGNOSIS — Z23 Encounter for immunization: Secondary | ICD-10-CM | POA: Diagnosis not present

## 2017-10-29 DIAGNOSIS — I25110 Atherosclerotic heart disease of native coronary artery with unstable angina pectoris: Secondary | ICD-10-CM | POA: Diagnosis present

## 2017-10-29 LAB — CBC
HEMATOCRIT: 44.4 % (ref 39.0–52.0)
HEMOGLOBIN: 14.3 g/dL (ref 13.0–17.0)
MCH: 28.1 pg (ref 26.0–34.0)
MCHC: 32.2 g/dL (ref 30.0–36.0)
MCV: 87.4 fL (ref 80.0–100.0)
NRBC: 0 % (ref 0.0–0.2)
Platelets: 157 10*3/uL (ref 150–400)
RBC: 5.08 MIL/uL (ref 4.22–5.81)
RDW: 15.4 % (ref 11.5–15.5)
WBC: 8 10*3/uL (ref 4.0–10.5)

## 2017-10-29 LAB — GLUCOSE, CAPILLARY
Glucose-Capillary: 131 mg/dL — ABNORMAL HIGH (ref 70–99)
Glucose-Capillary: 121 mg/dL — ABNORMAL HIGH (ref 70–99)

## 2017-10-29 LAB — BASIC METABOLIC PANEL
Anion gap: 6 (ref 5–15)
BUN: 19 mg/dL (ref 6–20)
CHLORIDE: 110 mmol/L (ref 98–111)
CO2: 24 mmol/L (ref 22–32)
Calcium: 9.1 mg/dL (ref 8.9–10.3)
Creatinine, Ser: 1.31 mg/dL — ABNORMAL HIGH (ref 0.61–1.24)
GFR calc Af Amer: >60 mL/min (ref >60)
GFR calc non Af Amer: >60 mL/min (ref >60)
GLUCOSE: 103 mg/dL — AB (ref 70–99)
POTASSIUM: 4.3 mmol/L (ref 3.5–5.1)
Sodium: 140 mmol/L (ref 135–145)

## 2017-10-29 MED ORDER — TICAGRELOR 90 MG PO TABS: 90.0000 mg | ORAL_TABLET | Freq: Two times a day (BID) | ORAL | 2 refills | Status: DC

## 2017-10-29 MED ORDER — METFORMIN HCL 1000 MG PO TABS: 1000.0000 mg | ORAL_TABLET | Freq: Two times a day (BID) | ORAL | Status: DC

## 2017-10-29 NOTE — Discharge Summary (Signed)
[facility] [place] NAME: Jose Carroll    MR#:  [id number]  DATE OF BIRTH:  [birth year]  DATE OF ADMISSION:  10/28/2017   ADMITTING PHYSICIAN: Dionisio David, MD  DATE OF DISCHARGE:  10/29/17  PRIMARY CARE PHYSICIAN: Danelle Berry, NP   ADMISSION DIAGNOSIS:   LT Heart Cath   Unstable Angina  DISCHARGE DIAGNOSIS:   Principal Problem:   Unstable angina (HCC) Active Problems:   CAD (coronary artery disease)   SECONDARY DIAGNOSIS:   Past Medical History:  Diagnosis Date  . Anginal pain ([place])   . Arthritis   . COPD (chronic obstructive pulmonary disease) ([place])   . Coronary artery disease   . Diabetes mellitus without complication ([place])   . GERD (gastroesophageal reflux disease)   . Hypertension    dr Humphrey Rolls   [place]  . Shortness of breath     HOSPITAL COURSE:   52 year old male with past medical history significant for COPD not on home oxygen, ongoing smoking, diabetes, hypertension, CKD stage III admitted for cardiac catheterization for unstable angina.  1.  Unstable angina-appreciate cardiology consult.  Patient underwent an elective cardiac catheterization yesterday and found to  distal RCA blockage -PCI to 95% distal circumflex disease placed.  RCA was chronically occluded and 100% stenosed with collaterals. -Chest pain-free at this time.  Ambulating well -On aspirin.  Plavix changed to Brilinta -On statin, metoprolol and benazepril.  Already on Imdur and Ranexa -We will follow-up with cardiology as outpatient  2.  CKD stage III-baseline creatinine around 1.4, creatinine stable at 1.3. -Hold metformin for the next couple of days.  Patient already follows with nephrology as outpatient.  Continue outpatient follow-up. -Was on IV bicarbonate drip here in the hospital.  3.  Diabetes mellitus-on Tyler Aas..  Patient on Jardiance, glipizide.  Metformin on hold for the next couple of days  4.  Chronic pain-continue  outpatient medications.  Ambulated well.  Discussed with cardiology.  For discharge today   DISCHARGE CONDITIONS:   Guarded  CONSULTS OBTAINED:   Treatment Team:  Murlean Iba, MD Dionisio David, MD  DRUG ALLERGIES:   Allergies  Allergen Reactions  . Naproxen Rash  . Penicillins Hives and Other (See Comments)    Has patient had a PCN reaction causing immediate rash, facial/tongue/throat swelling, SOB or lightheadedness with hypotension: No Has patient had a PCN reaction causing severe rash involving mucus membranes or skin necrosis: No Has patient had a PCN reaction that required hospitalization No Has patient had a PCN reaction occurring within the last 10 years: No If all of the above answers are "NO", then may proceed with Cephalosporin use.   DISCHARGE MEDICATIONS:   Allergies as of 10/29/2017      Reactions   Naproxen Rash   Penicillins Hives, Other (See Comments)   Has patient had a PCN reaction causing immediate rash, facial/tongue/throat swelling, SOB or lightheadedness with hypotension: No Has patient had a PCN reaction causing severe rash involving mucus membranes or skin necrosis: No Has patient had a PCN reaction that required hospitalization No Has patient had a PCN reaction occurring within the last 10 years: No If all of the above answers are "NO", then may proceed with Cephalosporin use.      Medication List    STOP taking these medications   clopidogrel 75 MG tablet Commonly known as:  PLAVIX   HYDROcodone-acetaminophen 5-325 mg Tabs tablet Commonly known as:  VICODIN  TAKE these medications   albuterol 108 (90 Base) MCG/ACT inhaler Commonly known as:  PROVENTIL HFA;VENTOLIN HFA Inhale 2 puffs into the lungs every 6 (six) hours as needed for wheezing or shortness of breath.   amLODipine-benazepril 10-40 MG capsule Commonly known as:  LOTREL Take 1 capsule by mouth daily.   aspirin EC 81 MG tablet Take 81 mg by mouth daily.     atorvastatin 80 MG tablet Commonly known as:  LIPITOR Take 80 mg by mouth daily.   DEXILANT 60 MG capsule Generic drug:  dexlansoprazole Take 60 mg by mouth daily.   doxazosin 4 MG 24 hr tablet Commonly known as:  CARDURA XL Take 4 mg by mouth daily with breakfast.   empagliflozin 25 MG Tabs tablet Commonly known as:  JARDIANCE Take 25 mg by mouth daily.   fenofibrate 145 MG tablet Commonly known as:  TRICOR Take 145 mg by mouth daily.   glipiZIDE 5 MG tablet Commonly known as:  GLUCOTROL Take 5 mg by mouth 2 (two) times daily before a meal.   INCRUSE ELLIPTA 62.5 MCG/INH Aepb Generic drug:  umeclidinium bromide Inhale 1 puff into the lungs daily.   ipratropium-albuterol 0.5-2.5 (3) MG/3ML Soln Commonly known as:  DUONEB Take 3 mLs by nebulization.   isosorbide mononitrate 60 MG 24 hr tablet Commonly known as:  IMDUR Take 60 mg by mouth 2 (two) times daily.   metFORMIN 1000 MG tablet Commonly known as:  GLUCOPHAGE Take 1 tablet (1,000 mg total) by mouth 2 (two) times daily with a meal. RESUME FROM 10/31/17 What changed:  additional instructions   metoprolol succinate 25 MG 24 hr tablet Commonly known as:  TOPROL-XL Take 25 mg by mouth daily.   nitroGLYCERIN 0.4 MG SL tablet Commonly known as:  NITROSTAT Place 0.4 mg under the tongue every 5 (five) minutes as needed for chest pain.   pregabalin 75 MG capsule Commonly known as:  LYRICA Take 75 mg by mouth every 8 (eight) hours.   ranolazine 1000 MG SR tablet Commonly known as:  RANEXA Take 1,000 mg by mouth 2 (two) times daily.   sitaGLIPtin 100 MG tablet Commonly known as:  JANUVIA Take 100 mg by mouth daily. Reported on 07/22/2015   ticagrelor 90 MG Tabs tablet Commonly known as:  BRILINTA Take 1 tablet (90 mg total) by mouth 2 (two) times daily.   tiZANidine 4 MG tablet Commonly known as:  ZANAFLEX Take 4 mg by mouth every 8 (eight) hours as needed for muscle spasms.   TRESIBA FLEXTOUCH 100  UNIT/ML Sopn FlexTouch Pen Generic drug:  insulin degludec Inject 10 Units into the skin at bedtime.   varenicline 0.5 MG tablet Commonly known as:  CHANTIX Take 0.5 mg by mouth daily. Reported on 07/22/2015   VASCEPA 1 g Caps Generic drug:  Icosapent Ethyl Take 2 g by mouth 2 (two) times daily.   XTAMPZA ER 9 MG C12a Generic drug:  oxyCODONE ER Take by mouth.   ZONTIVITY 2.08 MG Tabs Generic drug:  Vorapaxar Sulfate Take by mouth.        DISCHARGE INSTRUCTIONS:   1. PCP f/u in 1-2 weeks 2. Cardiology f/u in 1 week 3. Hold metformin for 2 more days and resume then due to cath  DIET:   Cardiac diet and Diabetic diet  ACTIVITY:   Activity as tolerated  OXYGEN:   Home Oxygen: No.  Oxygen Delivery: room air  DISCHARGE LOCATION:   home   If you experience worsening of  your admission symptoms, develop shortness of breath, life threatening emergency, suicidal or homicidal thoughts you must seek medical attention immediately by calling 911 or calling your MD immediately  if symptoms less severe.  You Must read complete instructions/literature along with all the possible adverse reactions/side effects for all the Medicines you take and that have been prescribed to you. Take any new Medicines after you have completely understood and accpet all the possible adverse reactions/side effects.   Please note  You were cared for by a hospitalist during your hospital stay. If you have any questions about your discharge medications or the care you received while you were in the hospital after you are discharged, you can call the unit and asked to speak with the hospitalist on call if the hospitalist that took care of you is not available. Once you are discharged, your primary care physician will handle any further medical issues. Please note that NO REFILLS for any discharge medications will be authorized once you are discharged, as it is imperative that you return to your primary  care physician (or establish a relationship with a primary care physician if you do not have one) for your aftercare needs so that they can reassess your need for medications and monitor your lab values.    On the day of Discharge:  VITAL SIGNS:   Blood pressure 140/88, pulse 73, temperature 97.9 F (36.6 C), temperature source Oral, resp. rate 18, height 5\' 10"  (1.778 m), weight 91.6 kg, SpO2 98 %.  PHYSICAL EXAMINATION:    GENERAL:  52 y.o.-year-old patient lying in the bed with no acute distress.  EYES: Pupils equal, round, reactive to light and accommodation. No scleral icterus. Extraocular muscles intact.  HEENT: Head atraumatic, normocephalic. Oropharynx and nasopharynx clear.  NECK:  Supple, no jugular venous distention. No thyroid enlargement, no tenderness.  LUNGS: Normal breath sounds bilaterally, no wheezing, rales,rhonchi or crepitation. Minimal wheezing at the bases. No use of accessory muscles of respiration.  CARDIOVASCULAR: S1, S2 normal. No murmurs, rubs, or gallops.  ABDOMEN: Soft, non-tender, non-distended. Bowel sounds present. No organomegaly or mass.  EXTREMITIES: No pedal edema, cyanosis, or clubbing. Cath site looks clean NEUROLOGIC: Cranial nerves II through XII are intact. Muscle strength 5/5 in all extremities. Sensation intact. Gait not checked.  PSYCHIATRIC: The patient is alert and oriented x 3.  SKIN: No obvious rash, lesion, or ulcer.   DATA REVIEW:   CBC Recent Labs  Lab 10/29/17 0333  WBC 8.0  HGB 14.3  HCT 44.4  PLT 157    Chemistries  Recent Labs  Lab 10/29/17 0333  NA 140  K 4.3  CL 110  CO2 24  GLUCOSE 103*  BUN 19  CREATININE 1.31*  CALCIUM 9.1     Microbiology Results  Results for orders placed or performed during the hospital encounter of 04/29/15  MRSA PCR Screening     Status: None   Collection Time: 04/30/15 12:44 AM  Result Value Ref Range Status   MRSA by PCR NEGATIVE NEGATIVE Final    Comment:        The  GeneXpert MRSA Assay (FDA approved for NASAL specimens only), is one component of a comprehensive MRSA colonization surveillance program. It is not intended to diagnose MRSA infection nor to guide or monitor treatment for MRSA infections.     RADIOLOGY:  No results found.   Management plans discussed with the patient, family and they are in agreement.  CODE STATUS:     Code Status  Orders  (From admission, onward)         Start     Ordered   10/28/17 1726  Full code  Continuous     10/28/17 1725        Code Status History    Date Active Date Inactive Code Status Order ID Comments User Context   10/28/2017 1340 10/28/2017 1725 Full Code [id number]  Wellington Hampshire, MD Inpatient   06/26/2015 1440 06/26/2015 1837 Full Code [id number]  Dionisio David, MD Inpatient   04/30/2015 0037 04/30/2015 1539 Full Code [id number]  Max Sane, MD Inpatient   09/10/2014 0949 09/10/2014 1357 Full Code [id number]  Dionisio David, MD Inpatient   02/14/2013 1609 02/16/2013 1434 Full Code [id number]  Eustace Moore, MD Inpatient      TOTAL TIME TAKING CARE OF THIS PATIENT: 38 minutes.    Hazley Dezeeuw M.D on 10/29/2017 at 10:00 AM  Between 7am to 6pm - Pager - [phone]  After 6pm go to [email or web address] - [employer] [place] [facility]  Office  [phone]  CC: Primary care physician; Danelle Berry, NP   Note: This dictation was prepared with Dragon dictation along with smaller phrase technology. Any transcriptional errors that result from this process are unintentional.

## 2017-10-29 NOTE — Progress Notes (Signed)
Discharge paperwork reviewed with pt.  No questions or concerns at this time.

## 2017-10-29 NOTE — Progress Notes (Signed)
Pt ambulated in hallway with RN.  No complaints of pain from incision site.  HR maintained rate from 95 - 105 bpm.  No issues noted.

## 2017-10-29 NOTE — Plan of Care (Signed)

## 2017-10-29 NOTE — Progress Notes (Signed)
SUBJECTIVE: doing well   Vitals:   10/28/17 1740 10/28/17 2039 10/29/17 0427 10/29/17 0720  BP: (!) 163/104 (!) 154/88 129/78 (!) 155/85  Pulse: 84 86 79 73  Resp: 18   18  Temp: (!) 97.3 F (36.3 C) 97.6 F (36.4 C) 97.6 F (36.4 C) 97.9 F (36.6 C)  TempSrc: Oral Oral Oral Oral  SpO2: 100% 99% 99% 98%  Weight:  94.3 kg 91.6 kg   Height:        Intake/Output Summary (Last 24 hours) at 10/29/2017 0940 Last data filed at 10/29/2017 0445 Gross per 24 hour  Intake 1155.1 ml  Output 1625 ml  Net -469.9 ml    LABS: Basic Metabolic Panel: Recent Labs    10/28/17 1801 10/29/17 0333  NA  --  140  K  --  4.3  CL  --  110  CO2  --  24  GLUCOSE  --  103*  BUN  --  19  CREATININE 1.37* 1.31*  CALCIUM  --  9.1   Liver Function Tests: No results for input(s): AST, ALT, ALKPHOS, BILITOT, PROT, ALBUMIN in the last 72 hours. No results for input(s): LIPASE, AMYLASE in the last 72 hours. CBC: Recent Labs    10/28/17 1801 10/29/17 0333  WBC 10.9* 8.0  HGB 15.4 14.3  HCT 48.2 44.4  MCV 88.0 87.4  PLT 178 157   Cardiac Enzymes: No results for input(s): CKTOTAL, CKMB, CKMBINDEX, TROPONINI in the last 72 hours. BNP: Invalid input(s): POCBNP D-Dimer: No results for input(s): DDIMER in the last 72 hours. Hemoglobin A1C: No results for input(s): HGBA1C in the last 72 hours. Fasting Lipid Panel: No results for input(s): CHOL, HDL, LDLCALC, TRIG, CHOLHDL, LDLDIRECT in the last 72 hours. Thyroid Function Tests: No results for input(s): TSH, T4TOTAL, T3FREE, THYROIDAB in the last 72 hours.  Invalid input(s): FREET3 Anemia Panel: No results for input(s): VITAMINB12, FOLATE, FERRITIN, TIBC, IRON, RETICCTPCT in the last 72 hours.   PHYSICAL EXAM General: Well developed, well nourished, in no acute distress HEENT:  Normocephalic and atramatic Neck:  No JVD.  Lungs: Clear bilaterally to auscultation and percussion. Heart: HRRR . Normal S1 and S2 without gallops or murmurs.   Abdomen: Bowel sounds are positive, abdomen soft and non-tender  Msk:  Back normal, normal gait. Normal strength and tone for age. Extremities: No clubbing, cyanosis or edema.   Neuro: Alert and oriented X 3. Psych:  Good affect, responds appropriately  TELEMETRY: nsr  ASSESSMENT AND PLAN: SS/p PCI distal LCX, may go home on brilanta and asp, and f/u Monday 10 am.  Principal Problem:   Unstable angina (Jose Carroll) Active Problems:   CAD (coronary artery disease)    Jose Carroll A, MD, [facility] 10/29/2017 9:40 AM

## 2017-10-31 ENCOUNTER — Encounter: Payer: Self-pay | Admitting: Cardiovascular Disease

## 2017-10-31 LAB — GLUCOSE, CAPILLARY: Glucose-Capillary: 92 mg/dL (ref 70–99)

## 2018-02-07 DIAGNOSIS — M5440 Lumbago with sciatica, unspecified side: Secondary | ICD-10-CM | POA: Diagnosis not present

## 2018-02-07 DIAGNOSIS — R69 Illness, unspecified: Secondary | ICD-10-CM | POA: Diagnosis not present

## 2018-02-07 DIAGNOSIS — J449 Chronic obstructive pulmonary disease, unspecified: Secondary | ICD-10-CM | POA: Diagnosis not present

## 2018-02-07 DIAGNOSIS — Z79899 Other long term (current) drug therapy: Secondary | ICD-10-CM | POA: Diagnosis not present

## 2018-02-07 DIAGNOSIS — M961 Postlaminectomy syndrome, not elsewhere classified: Secondary | ICD-10-CM | POA: Diagnosis not present

## 2018-02-07 DIAGNOSIS — G8929 Other chronic pain: Secondary | ICD-10-CM | POA: Diagnosis not present

## 2018-02-27 DIAGNOSIS — E559 Vitamin D deficiency, unspecified: Secondary | ICD-10-CM | POA: Diagnosis not present

## 2018-02-27 DIAGNOSIS — E1142 Type 2 diabetes mellitus with diabetic polyneuropathy: Secondary | ICD-10-CM | POA: Diagnosis not present

## 2018-02-27 DIAGNOSIS — E1165 Type 2 diabetes mellitus with hyperglycemia: Secondary | ICD-10-CM | POA: Diagnosis not present

## 2018-02-27 DIAGNOSIS — E785 Hyperlipidemia, unspecified: Secondary | ICD-10-CM | POA: Diagnosis not present

## 2018-02-27 DIAGNOSIS — R339 Retention of urine, unspecified: Secondary | ICD-10-CM | POA: Diagnosis not present

## 2018-02-27 DIAGNOSIS — I10 Essential (primary) hypertension: Secondary | ICD-10-CM | POA: Diagnosis not present

## 2018-02-28 DIAGNOSIS — E785 Hyperlipidemia, unspecified: Secondary | ICD-10-CM | POA: Diagnosis not present

## 2018-02-28 DIAGNOSIS — K210 Gastro-esophageal reflux disease with esophagitis: Secondary | ICD-10-CM | POA: Diagnosis not present

## 2018-02-28 DIAGNOSIS — I10 Essential (primary) hypertension: Secondary | ICD-10-CM | POA: Diagnosis not present

## 2018-02-28 DIAGNOSIS — J309 Allergic rhinitis, unspecified: Secondary | ICD-10-CM | POA: Diagnosis not present

## 2018-02-28 DIAGNOSIS — I2510 Atherosclerotic heart disease of native coronary artery without angina pectoris: Secondary | ICD-10-CM | POA: Diagnosis not present

## 2018-02-28 DIAGNOSIS — R0602 Shortness of breath: Secondary | ICD-10-CM | POA: Diagnosis not present

## 2018-02-28 DIAGNOSIS — E118 Type 2 diabetes mellitus with unspecified complications: Secondary | ICD-10-CM | POA: Diagnosis not present

## 2018-02-28 DIAGNOSIS — J449 Chronic obstructive pulmonary disease, unspecified: Secondary | ICD-10-CM | POA: Diagnosis not present

## 2018-03-06 DIAGNOSIS — J449 Chronic obstructive pulmonary disease, unspecified: Secondary | ICD-10-CM | POA: Diagnosis not present

## 2018-03-06 DIAGNOSIS — M5440 Lumbago with sciatica, unspecified side: Secondary | ICD-10-CM | POA: Diagnosis not present

## 2018-03-06 DIAGNOSIS — G8929 Other chronic pain: Secondary | ICD-10-CM | POA: Diagnosis not present

## 2018-03-06 DIAGNOSIS — F1120 Opioid dependence, uncomplicated: Secondary | ICD-10-CM | POA: Diagnosis not present

## 2018-03-06 DIAGNOSIS — Z79899 Other long term (current) drug therapy: Secondary | ICD-10-CM | POA: Diagnosis not present

## 2018-03-06 DIAGNOSIS — M961 Postlaminectomy syndrome, not elsewhere classified: Secondary | ICD-10-CM | POA: Diagnosis not present

## 2018-03-23 DIAGNOSIS — E1122 Type 2 diabetes mellitus with diabetic chronic kidney disease: Secondary | ICD-10-CM | POA: Diagnosis not present

## 2018-03-23 DIAGNOSIS — N183 Chronic kidney disease, stage 3 (moderate): Secondary | ICD-10-CM | POA: Diagnosis not present

## 2018-03-23 DIAGNOSIS — D631 Anemia in chronic kidney disease: Secondary | ICD-10-CM | POA: Diagnosis not present

## 2018-03-23 DIAGNOSIS — I10 Essential (primary) hypertension: Secondary | ICD-10-CM | POA: Diagnosis not present

## 2018-04-03 DIAGNOSIS — M5440 Lumbago with sciatica, unspecified side: Secondary | ICD-10-CM | POA: Diagnosis not present

## 2018-04-03 DIAGNOSIS — G8929 Other chronic pain: Secondary | ICD-10-CM | POA: Diagnosis not present

## 2018-04-03 DIAGNOSIS — J449 Chronic obstructive pulmonary disease, unspecified: Secondary | ICD-10-CM | POA: Diagnosis not present

## 2018-04-03 DIAGNOSIS — G8928 Other chronic postprocedural pain: Secondary | ICD-10-CM | POA: Diagnosis not present

## 2018-04-03 DIAGNOSIS — M961 Postlaminectomy syndrome, not elsewhere classified: Secondary | ICD-10-CM | POA: Diagnosis not present

## 2018-04-03 DIAGNOSIS — G894 Chronic pain syndrome: Secondary | ICD-10-CM | POA: Diagnosis not present

## 2018-04-03 DIAGNOSIS — I10 Essential (primary) hypertension: Secondary | ICD-10-CM | POA: Diagnosis not present

## 2018-04-03 DIAGNOSIS — M545 Low back pain: Secondary | ICD-10-CM | POA: Diagnosis not present

## 2018-05-01 DIAGNOSIS — G8928 Other chronic postprocedural pain: Secondary | ICD-10-CM | POA: Diagnosis not present

## 2018-05-01 DIAGNOSIS — F1120 Opioid dependence, uncomplicated: Secondary | ICD-10-CM | POA: Diagnosis not present

## 2018-05-01 DIAGNOSIS — M961 Postlaminectomy syndrome, not elsewhere classified: Secondary | ICD-10-CM | POA: Diagnosis not present

## 2018-05-01 DIAGNOSIS — I10 Essential (primary) hypertension: Secondary | ICD-10-CM | POA: Diagnosis not present

## 2018-05-01 DIAGNOSIS — G8929 Other chronic pain: Secondary | ICD-10-CM | POA: Diagnosis not present

## 2018-05-01 DIAGNOSIS — M545 Low back pain: Secondary | ICD-10-CM | POA: Diagnosis not present

## 2018-05-01 DIAGNOSIS — G894 Chronic pain syndrome: Secondary | ICD-10-CM | POA: Diagnosis not present

## 2018-05-01 DIAGNOSIS — Z79899 Other long term (current) drug therapy: Secondary | ICD-10-CM | POA: Diagnosis not present

## 2018-05-01 DIAGNOSIS — J449 Chronic obstructive pulmonary disease, unspecified: Secondary | ICD-10-CM | POA: Diagnosis not present

## 2018-05-26 DIAGNOSIS — E785 Hyperlipidemia, unspecified: Secondary | ICD-10-CM | POA: Diagnosis not present

## 2018-05-26 DIAGNOSIS — E118 Type 2 diabetes mellitus with unspecified complications: Secondary | ICD-10-CM | POA: Diagnosis not present

## 2018-05-26 DIAGNOSIS — I10 Essential (primary) hypertension: Secondary | ICD-10-CM | POA: Diagnosis not present

## 2018-06-07 DIAGNOSIS — I10 Essential (primary) hypertension: Secondary | ICD-10-CM | POA: Diagnosis not present

## 2018-06-07 DIAGNOSIS — J449 Chronic obstructive pulmonary disease, unspecified: Secondary | ICD-10-CM | POA: Diagnosis not present

## 2018-06-07 DIAGNOSIS — M961 Postlaminectomy syndrome, not elsewhere classified: Secondary | ICD-10-CM | POA: Diagnosis not present

## 2018-06-07 DIAGNOSIS — G8929 Other chronic pain: Secondary | ICD-10-CM | POA: Diagnosis not present

## 2018-06-07 DIAGNOSIS — F1120 Opioid dependence, uncomplicated: Secondary | ICD-10-CM | POA: Diagnosis not present

## 2018-06-07 DIAGNOSIS — Z79899 Other long term (current) drug therapy: Secondary | ICD-10-CM | POA: Diagnosis not present

## 2018-06-07 DIAGNOSIS — G894 Chronic pain syndrome: Secondary | ICD-10-CM | POA: Diagnosis not present

## 2018-06-07 DIAGNOSIS — M545 Low back pain: Secondary | ICD-10-CM | POA: Diagnosis not present

## 2018-06-07 DIAGNOSIS — G8928 Other chronic postprocedural pain: Secondary | ICD-10-CM | POA: Diagnosis not present

## 2018-07-05 DIAGNOSIS — E785 Hyperlipidemia, unspecified: Secondary | ICD-10-CM | POA: Diagnosis not present

## 2018-07-05 DIAGNOSIS — R079 Chest pain, unspecified: Secondary | ICD-10-CM | POA: Diagnosis not present

## 2018-07-05 DIAGNOSIS — E118 Type 2 diabetes mellitus with unspecified complications: Secondary | ICD-10-CM | POA: Diagnosis not present

## 2018-07-05 DIAGNOSIS — J309 Allergic rhinitis, unspecified: Secondary | ICD-10-CM | POA: Diagnosis not present

## 2018-07-05 DIAGNOSIS — E559 Vitamin D deficiency, unspecified: Secondary | ICD-10-CM | POA: Diagnosis not present

## 2018-07-05 DIAGNOSIS — J449 Chronic obstructive pulmonary disease, unspecified: Secondary | ICD-10-CM | POA: Diagnosis not present

## 2018-07-05 DIAGNOSIS — I10 Essential (primary) hypertension: Secondary | ICD-10-CM | POA: Diagnosis not present

## 2018-07-05 DIAGNOSIS — E119 Type 2 diabetes mellitus without complications: Secondary | ICD-10-CM | POA: Diagnosis not present

## 2018-07-10 DIAGNOSIS — M545 Low back pain: Secondary | ICD-10-CM | POA: Diagnosis not present

## 2018-07-10 DIAGNOSIS — I10 Essential (primary) hypertension: Secondary | ICD-10-CM | POA: Diagnosis not present

## 2018-07-10 DIAGNOSIS — G8928 Other chronic postprocedural pain: Secondary | ICD-10-CM | POA: Diagnosis not present

## 2018-07-10 DIAGNOSIS — G8929 Other chronic pain: Secondary | ICD-10-CM | POA: Diagnosis not present

## 2018-07-10 DIAGNOSIS — M5440 Lumbago with sciatica, unspecified side: Secondary | ICD-10-CM | POA: Diagnosis not present

## 2018-07-10 DIAGNOSIS — F1120 Opioid dependence, uncomplicated: Secondary | ICD-10-CM | POA: Diagnosis not present

## 2018-07-10 DIAGNOSIS — J449 Chronic obstructive pulmonary disease, unspecified: Secondary | ICD-10-CM | POA: Diagnosis not present

## 2018-07-10 DIAGNOSIS — M961 Postlaminectomy syndrome, not elsewhere classified: Secondary | ICD-10-CM | POA: Diagnosis not present

## 2018-07-10 DIAGNOSIS — Z79899 Other long term (current) drug therapy: Secondary | ICD-10-CM | POA: Diagnosis not present

## 2018-07-10 DIAGNOSIS — G894 Chronic pain syndrome: Secondary | ICD-10-CM | POA: Diagnosis not present

## 2018-07-15 DIAGNOSIS — E119 Type 2 diabetes mellitus without complications: Secondary | ICD-10-CM | POA: Diagnosis not present

## 2018-08-07 DIAGNOSIS — J449 Chronic obstructive pulmonary disease, unspecified: Secondary | ICD-10-CM | POA: Diagnosis not present

## 2018-08-07 DIAGNOSIS — M961 Postlaminectomy syndrome, not elsewhere classified: Secondary | ICD-10-CM | POA: Diagnosis not present

## 2018-08-07 DIAGNOSIS — M545 Low back pain: Secondary | ICD-10-CM | POA: Diagnosis not present

## 2018-08-07 DIAGNOSIS — F1120 Opioid dependence, uncomplicated: Secondary | ICD-10-CM | POA: Diagnosis not present

## 2018-08-07 DIAGNOSIS — Z79899 Other long term (current) drug therapy: Secondary | ICD-10-CM | POA: Diagnosis not present

## 2018-08-07 DIAGNOSIS — G894 Chronic pain syndrome: Secondary | ICD-10-CM | POA: Diagnosis not present

## 2018-08-07 DIAGNOSIS — I10 Essential (primary) hypertension: Secondary | ICD-10-CM | POA: Diagnosis not present

## 2018-09-11 DIAGNOSIS — G8929 Other chronic pain: Secondary | ICD-10-CM | POA: Diagnosis not present

## 2018-09-11 DIAGNOSIS — J449 Chronic obstructive pulmonary disease, unspecified: Secondary | ICD-10-CM | POA: Diagnosis not present

## 2018-09-11 DIAGNOSIS — F1120 Opioid dependence, uncomplicated: Secondary | ICD-10-CM | POA: Diagnosis not present

## 2018-09-11 DIAGNOSIS — Z79899 Other long term (current) drug therapy: Secondary | ICD-10-CM | POA: Diagnosis not present

## 2018-09-11 DIAGNOSIS — G894 Chronic pain syndrome: Secondary | ICD-10-CM | POA: Diagnosis not present

## 2018-09-11 DIAGNOSIS — M545 Low back pain: Secondary | ICD-10-CM | POA: Diagnosis not present

## 2018-09-11 DIAGNOSIS — M5440 Lumbago with sciatica, unspecified side: Secondary | ICD-10-CM | POA: Diagnosis not present

## 2018-09-11 DIAGNOSIS — I10 Essential (primary) hypertension: Secondary | ICD-10-CM | POA: Diagnosis not present

## 2018-09-11 DIAGNOSIS — M961 Postlaminectomy syndrome, not elsewhere classified: Secondary | ICD-10-CM | POA: Diagnosis not present

## 2018-09-11 DIAGNOSIS — G8928 Other chronic postprocedural pain: Secondary | ICD-10-CM | POA: Diagnosis not present

## 2018-09-22 DIAGNOSIS — K210 Gastro-esophageal reflux disease with esophagitis: Secondary | ICD-10-CM | POA: Diagnosis not present

## 2018-09-22 DIAGNOSIS — E785 Hyperlipidemia, unspecified: Secondary | ICD-10-CM | POA: Diagnosis not present

## 2018-09-22 DIAGNOSIS — R0602 Shortness of breath: Secondary | ICD-10-CM | POA: Diagnosis not present

## 2018-09-22 DIAGNOSIS — R079 Chest pain, unspecified: Secondary | ICD-10-CM | POA: Diagnosis not present

## 2018-09-22 DIAGNOSIS — I2510 Atherosclerotic heart disease of native coronary artery without angina pectoris: Secondary | ICD-10-CM | POA: Diagnosis not present

## 2018-09-22 DIAGNOSIS — G4733 Obstructive sleep apnea (adult) (pediatric): Secondary | ICD-10-CM | POA: Diagnosis not present

## 2018-10-09 DIAGNOSIS — M961 Postlaminectomy syndrome, not elsewhere classified: Secondary | ICD-10-CM | POA: Diagnosis not present

## 2018-10-09 DIAGNOSIS — Z79899 Other long term (current) drug therapy: Secondary | ICD-10-CM | POA: Diagnosis not present

## 2018-10-09 DIAGNOSIS — G894 Chronic pain syndrome: Secondary | ICD-10-CM | POA: Diagnosis not present

## 2018-10-09 DIAGNOSIS — I10 Essential (primary) hypertension: Secondary | ICD-10-CM | POA: Diagnosis not present

## 2018-10-09 DIAGNOSIS — G8929 Other chronic pain: Secondary | ICD-10-CM | POA: Diagnosis not present

## 2018-10-09 DIAGNOSIS — G8928 Other chronic postprocedural pain: Secondary | ICD-10-CM | POA: Diagnosis not present

## 2018-10-09 DIAGNOSIS — F1120 Opioid dependence, uncomplicated: Secondary | ICD-10-CM | POA: Diagnosis not present

## 2018-10-09 DIAGNOSIS — J449 Chronic obstructive pulmonary disease, unspecified: Secondary | ICD-10-CM | POA: Diagnosis not present

## 2018-10-09 DIAGNOSIS — M545 Low back pain: Secondary | ICD-10-CM | POA: Diagnosis not present

## 2018-10-14 DIAGNOSIS — E119 Type 2 diabetes mellitus without complications: Secondary | ICD-10-CM | POA: Diagnosis not present

## 2018-10-23 DIAGNOSIS — J441 Chronic obstructive pulmonary disease with (acute) exacerbation: Secondary | ICD-10-CM | POA: Diagnosis not present

## 2018-10-23 DIAGNOSIS — R5383 Other fatigue: Secondary | ICD-10-CM | POA: Diagnosis not present

## 2018-10-23 DIAGNOSIS — E785 Hyperlipidemia, unspecified: Secondary | ICD-10-CM | POA: Diagnosis not present

## 2018-10-23 DIAGNOSIS — R339 Retention of urine, unspecified: Secondary | ICD-10-CM | POA: Diagnosis not present

## 2018-10-23 DIAGNOSIS — I10 Essential (primary) hypertension: Secondary | ICD-10-CM | POA: Diagnosis not present

## 2018-10-23 DIAGNOSIS — N390 Urinary tract infection, site not specified: Secondary | ICD-10-CM | POA: Diagnosis not present

## 2018-10-23 DIAGNOSIS — E119 Type 2 diabetes mellitus without complications: Secondary | ICD-10-CM | POA: Diagnosis not present

## 2018-10-30 DIAGNOSIS — R339 Retention of urine, unspecified: Secondary | ICD-10-CM | POA: Diagnosis not present

## 2018-10-30 DIAGNOSIS — E785 Hyperlipidemia, unspecified: Secondary | ICD-10-CM | POA: Diagnosis not present

## 2018-10-30 DIAGNOSIS — I10 Essential (primary) hypertension: Secondary | ICD-10-CM | POA: Diagnosis not present

## 2018-10-30 DIAGNOSIS — E119 Type 2 diabetes mellitus without complications: Secondary | ICD-10-CM | POA: Diagnosis not present

## 2018-10-30 DIAGNOSIS — R5383 Other fatigue: Secondary | ICD-10-CM | POA: Diagnosis not present

## 2018-10-30 DIAGNOSIS — N390 Urinary tract infection, site not specified: Secondary | ICD-10-CM | POA: Diagnosis not present

## 2018-11-06 DIAGNOSIS — I10 Essential (primary) hypertension: Secondary | ICD-10-CM | POA: Diagnosis not present

## 2018-11-06 DIAGNOSIS — G894 Chronic pain syndrome: Secondary | ICD-10-CM | POA: Diagnosis not present

## 2018-11-06 DIAGNOSIS — G8929 Other chronic pain: Secondary | ICD-10-CM | POA: Diagnosis not present

## 2018-11-06 DIAGNOSIS — M545 Low back pain: Secondary | ICD-10-CM | POA: Diagnosis not present

## 2018-11-06 DIAGNOSIS — G8928 Other chronic postprocedural pain: Secondary | ICD-10-CM | POA: Diagnosis not present

## 2018-11-06 DIAGNOSIS — M5440 Lumbago with sciatica, unspecified side: Secondary | ICD-10-CM | POA: Diagnosis not present

## 2018-11-06 DIAGNOSIS — M961 Postlaminectomy syndrome, not elsewhere classified: Secondary | ICD-10-CM | POA: Diagnosis not present

## 2018-11-06 DIAGNOSIS — J449 Chronic obstructive pulmonary disease, unspecified: Secondary | ICD-10-CM | POA: Diagnosis not present

## 2018-12-04 DIAGNOSIS — Z79899 Other long term (current) drug therapy: Secondary | ICD-10-CM | POA: Diagnosis not present

## 2018-12-04 DIAGNOSIS — F1120 Opioid dependence, uncomplicated: Secondary | ICD-10-CM | POA: Diagnosis not present

## 2018-12-11 DIAGNOSIS — G894 Chronic pain syndrome: Secondary | ICD-10-CM | POA: Diagnosis not present

## 2018-12-11 DIAGNOSIS — M545 Low back pain: Secondary | ICD-10-CM | POA: Diagnosis not present

## 2018-12-11 DIAGNOSIS — M961 Postlaminectomy syndrome, not elsewhere classified: Secondary | ICD-10-CM | POA: Diagnosis not present

## 2018-12-11 DIAGNOSIS — J449 Chronic obstructive pulmonary disease, unspecified: Secondary | ICD-10-CM | POA: Diagnosis not present

## 2018-12-11 DIAGNOSIS — G8929 Other chronic pain: Secondary | ICD-10-CM | POA: Diagnosis not present

## 2018-12-11 DIAGNOSIS — G8928 Other chronic postprocedural pain: Secondary | ICD-10-CM | POA: Diagnosis not present

## 2018-12-11 DIAGNOSIS — I10 Essential (primary) hypertension: Secondary | ICD-10-CM | POA: Diagnosis not present

## 2018-12-21 DIAGNOSIS — I10 Essential (primary) hypertension: Secondary | ICD-10-CM | POA: Diagnosis not present

## 2018-12-21 DIAGNOSIS — E785 Hyperlipidemia, unspecified: Secondary | ICD-10-CM | POA: Diagnosis not present

## 2018-12-21 DIAGNOSIS — I2510 Atherosclerotic heart disease of native coronary artery without angina pectoris: Secondary | ICD-10-CM | POA: Diagnosis not present

## 2018-12-21 DIAGNOSIS — R0602 Shortness of breath: Secondary | ICD-10-CM | POA: Diagnosis not present

## 2019-01-08 DIAGNOSIS — M5440 Lumbago with sciatica, unspecified side: Secondary | ICD-10-CM | POA: Diagnosis not present

## 2019-01-08 DIAGNOSIS — I10 Essential (primary) hypertension: Secondary | ICD-10-CM | POA: Diagnosis not present

## 2019-01-08 DIAGNOSIS — J449 Chronic obstructive pulmonary disease, unspecified: Secondary | ICD-10-CM | POA: Diagnosis not present

## 2019-01-08 DIAGNOSIS — M961 Postlaminectomy syndrome, not elsewhere classified: Secondary | ICD-10-CM | POA: Diagnosis not present

## 2019-01-08 DIAGNOSIS — M545 Low back pain: Secondary | ICD-10-CM | POA: Diagnosis not present

## 2019-01-08 DIAGNOSIS — G894 Chronic pain syndrome: Secondary | ICD-10-CM | POA: Diagnosis not present

## 2019-01-13 DIAGNOSIS — E119 Type 2 diabetes mellitus without complications: Secondary | ICD-10-CM | POA: Diagnosis not present

## 2019-01-25 DIAGNOSIS — E785 Hyperlipidemia, unspecified: Secondary | ICD-10-CM | POA: Diagnosis not present

## 2019-01-25 DIAGNOSIS — I10 Essential (primary) hypertension: Secondary | ICD-10-CM | POA: Diagnosis not present

## 2019-01-25 DIAGNOSIS — E118 Type 2 diabetes mellitus with unspecified complications: Secondary | ICD-10-CM | POA: Diagnosis not present

## 2019-01-25 DIAGNOSIS — E119 Type 2 diabetes mellitus without complications: Secondary | ICD-10-CM | POA: Diagnosis not present

## 2019-01-26 DIAGNOSIS — E785 Hyperlipidemia, unspecified: Secondary | ICD-10-CM | POA: Diagnosis not present

## 2019-01-26 DIAGNOSIS — N400 Enlarged prostate without lower urinary tract symptoms: Secondary | ICD-10-CM | POA: Diagnosis not present

## 2019-01-26 DIAGNOSIS — E119 Type 2 diabetes mellitus without complications: Secondary | ICD-10-CM | POA: Diagnosis not present

## 2019-01-26 DIAGNOSIS — R5383 Other fatigue: Secondary | ICD-10-CM | POA: Diagnosis not present

## 2019-01-26 DIAGNOSIS — J449 Chronic obstructive pulmonary disease, unspecified: Secondary | ICD-10-CM | POA: Diagnosis not present

## 2019-02-05 DIAGNOSIS — M545 Low back pain: Secondary | ICD-10-CM | POA: Diagnosis not present

## 2019-02-05 DIAGNOSIS — G894 Chronic pain syndrome: Secondary | ICD-10-CM | POA: Diagnosis not present

## 2019-02-05 DIAGNOSIS — I10 Essential (primary) hypertension: Secondary | ICD-10-CM | POA: Diagnosis not present

## 2019-02-05 DIAGNOSIS — Z79899 Other long term (current) drug therapy: Secondary | ICD-10-CM | POA: Diagnosis not present

## 2019-02-05 DIAGNOSIS — G8929 Other chronic pain: Secondary | ICD-10-CM | POA: Diagnosis not present

## 2019-02-05 DIAGNOSIS — M961 Postlaminectomy syndrome, not elsewhere classified: Secondary | ICD-10-CM | POA: Diagnosis not present

## 2019-02-05 DIAGNOSIS — J449 Chronic obstructive pulmonary disease, unspecified: Secondary | ICD-10-CM | POA: Diagnosis not present

## 2019-02-05 DIAGNOSIS — G8928 Other chronic postprocedural pain: Secondary | ICD-10-CM | POA: Diagnosis not present

## 2019-02-19 DIAGNOSIS — J449 Chronic obstructive pulmonary disease, unspecified: Secondary | ICD-10-CM | POA: Diagnosis not present

## 2019-02-22 DIAGNOSIS — R079 Chest pain, unspecified: Secondary | ICD-10-CM | POA: Diagnosis not present

## 2019-02-22 DIAGNOSIS — E113212 Type 2 diabetes mellitus with mild nonproliferative diabetic retinopathy with macular edema, left eye: Secondary | ICD-10-CM | POA: Diagnosis not present

## 2019-02-22 DIAGNOSIS — J449 Chronic obstructive pulmonary disease, unspecified: Secondary | ICD-10-CM | POA: Diagnosis not present

## 2019-02-22 DIAGNOSIS — R69 Illness, unspecified: Secondary | ICD-10-CM | POA: Diagnosis not present

## 2019-02-22 DIAGNOSIS — K2100 Gastro-esophageal reflux disease with esophagitis, without bleeding: Secondary | ICD-10-CM | POA: Diagnosis not present

## 2019-02-22 DIAGNOSIS — E785 Hyperlipidemia, unspecified: Secondary | ICD-10-CM | POA: Diagnosis not present

## 2019-02-22 DIAGNOSIS — R0602 Shortness of breath: Secondary | ICD-10-CM | POA: Diagnosis not present

## 2019-02-22 DIAGNOSIS — I2510 Atherosclerotic heart disease of native coronary artery without angina pectoris: Secondary | ICD-10-CM | POA: Diagnosis not present

## 2019-02-22 DIAGNOSIS — I10 Essential (primary) hypertension: Secondary | ICD-10-CM | POA: Diagnosis not present

## 2019-02-22 DIAGNOSIS — R5383 Other fatigue: Secondary | ICD-10-CM | POA: Diagnosis not present

## 2019-03-05 DIAGNOSIS — I10 Essential (primary) hypertension: Secondary | ICD-10-CM | POA: Diagnosis not present

## 2019-03-05 DIAGNOSIS — M545 Low back pain: Secondary | ICD-10-CM | POA: Diagnosis not present

## 2019-03-05 DIAGNOSIS — J449 Chronic obstructive pulmonary disease, unspecified: Secondary | ICD-10-CM | POA: Diagnosis not present

## 2019-03-05 DIAGNOSIS — G8928 Other chronic postprocedural pain: Secondary | ICD-10-CM | POA: Diagnosis not present

## 2019-03-05 DIAGNOSIS — G8929 Other chronic pain: Secondary | ICD-10-CM | POA: Diagnosis not present

## 2019-03-05 DIAGNOSIS — M961 Postlaminectomy syndrome, not elsewhere classified: Secondary | ICD-10-CM | POA: Diagnosis not present

## 2019-03-05 DIAGNOSIS — G894 Chronic pain syndrome: Secondary | ICD-10-CM | POA: Diagnosis not present

## 2019-03-05 DIAGNOSIS — Z79899 Other long term (current) drug therapy: Secondary | ICD-10-CM | POA: Diagnosis not present

## 2019-03-12 ENCOUNTER — Encounter (INDEPENDENT_AMBULATORY_CARE_PROVIDER_SITE_OTHER): Payer: Self-pay

## 2019-03-12 ENCOUNTER — Inpatient Hospital Stay: Payer: Medicare HMO

## 2019-03-12 ENCOUNTER — Encounter: Payer: Self-pay | Admitting: Oncology

## 2019-03-12 ENCOUNTER — Other Ambulatory Visit: Payer: Self-pay

## 2019-03-12 ENCOUNTER — Inpatient Hospital Stay: Payer: Medicare HMO | Attending: Oncology | Admitting: Oncology

## 2019-03-12 VITALS — BP 117/91 | HR 90 | Temp 96.3°F | Ht 70.0 in | Wt 199.0 lb

## 2019-03-12 DIAGNOSIS — E119 Type 2 diabetes mellitus without complications: Secondary | ICD-10-CM

## 2019-03-12 DIAGNOSIS — I10 Essential (primary) hypertension: Secondary | ICD-10-CM

## 2019-03-12 DIAGNOSIS — J449 Chronic obstructive pulmonary disease, unspecified: Secondary | ICD-10-CM | POA: Diagnosis not present

## 2019-03-12 DIAGNOSIS — D696 Thrombocytopenia, unspecified: Secondary | ICD-10-CM

## 2019-03-12 DIAGNOSIS — Z87891 Personal history of nicotine dependence: Secondary | ICD-10-CM | POA: Insufficient documentation

## 2019-03-12 LAB — TECHNOLOGIST SMEAR REVIEW: Tech Review: NORMAL

## 2019-03-12 LAB — CBC WITH DIFFERENTIAL/PLATELET
Abs Immature Granulocytes: 0.02 10*3/uL (ref 0.00–0.07)
Basophils Absolute: 0.1 10*3/uL (ref 0.0–0.1)
Basophils Relative: 1 %
Eosinophils Absolute: 0.2 10*3/uL (ref 0.0–0.5)
Eosinophils Relative: 2 %
HCT: 50.8 % (ref 39.0–52.0)
Hemoglobin: 16.4 g/dL (ref 13.0–17.0)
Immature Granulocytes: 0 %
Lymphocytes Relative: 26 %
Lymphs Abs: 2.2 10*3/uL (ref 0.7–4.0)
MCH: 27.9 pg (ref 26.0–34.0)
MCHC: 32.3 g/dL (ref 30.0–36.0)
MCV: 86.4 fL (ref 80.0–100.0)
Monocytes Absolute: 0.6 10*3/uL (ref 0.1–1.0)
Monocytes Relative: 7 %
Neutro Abs: 5.6 10*3/uL (ref 1.7–7.7)
Neutrophils Relative %: 64 %
Platelets: 139 10*3/uL — ABNORMAL LOW (ref 150–400)
RBC: 5.88 MIL/uL — ABNORMAL HIGH (ref 4.22–5.81)
RDW: 13.9 % (ref 11.5–15.5)
WBC: 8.6 10*3/uL (ref 4.0–10.5)
nRBC: 0 % (ref 0.0–0.2)

## 2019-03-12 LAB — HEPATITIS C ANTIBODY: HCV Ab: NONREACTIVE

## 2019-03-12 LAB — FOLATE: Folate: 32 ng/mL (ref >5.9)

## 2019-03-12 LAB — HIV ANTIBODY (ROUTINE TESTING W REFLEX): HIV Screen 4th Generation wRfx: NONREACTIVE

## 2019-03-12 LAB — VITAMIN B12: Vitamin B-12: 575 pg/mL (ref 180–914)

## 2019-03-12 NOTE — Progress Notes (Signed)
Patient is here today to establish care for thrombocytopenia. 

## 2019-03-13 ENCOUNTER — Encounter: Payer: Self-pay | Admitting: Oncology

## 2019-03-13 NOTE — Progress Notes (Signed)
Hematology/Oncology Consult note [facility] [phone] [phone]  Patient Care Team: Danelle Berry, NP as PCP - General (Nurse Practitioner)   Name of the patient: Jose Carroll  ID:[id number]  May 25, 1965    Reason for referral-thrombocytopenia   Referring physician-Chelsea Charlynn Grimes, NP  Date of visit: 03/13/19   History of presenting illness-patient is a 54 year old male with a past medical history significant for coronary artery disease, degenerative disc disease among other medical problems.  He has been referred to [place] for thrombocytopenia. On review of his prior labs patient has had mild intermittent thrombocytopenia with a platelet count between 120s to 130s between 20 13-20 19.  Most recent CBC in February 2021 showed a white count of 7.7, hemoglobin of 15.4 and a platelet count of 136.  Liver function tests were normal.  Patient denies any history of chronic liver disease or alcohol consumption.  Denies any bleeding or bruising.  Overall his weight has been stable and he denies any changes in his appetite or new aches or pains anywhere.  ECOG PS- 1  Pain scale- 0   Review of systems- Review of Systems  Constitutional: Negative for chills, fever, malaise/fatigue and weight loss.  HENT: Negative for congestion, ear discharge and nosebleeds.   Eyes: Negative for blurred vision.  Respiratory: Negative for cough, hemoptysis, sputum production, shortness of breath and wheezing.   Cardiovascular: Negative for chest pain, palpitations, orthopnea and claudication.  Gastrointestinal: Negative for abdominal pain, blood in stool, constipation, diarrhea, heartburn, melena, nausea and vomiting.  Genitourinary: Negative for dysuria, flank pain, frequency, hematuria and urgency.  Musculoskeletal: Negative for back pain, joint pain and myalgias.  Skin: Negative for rash.  Neurological: Negative for dizziness, tingling, focal weakness,  seizures, weakness and headaches.  Endo/Heme/Allergies: Does not bruise/bleed easily.  Psychiatric/Behavioral: Negative for depression and suicidal ideas. The patient does not have insomnia.     Allergies  Allergen Reactions  . Naproxen Rash  . Penicillins Hives and Other (See Comments)    Has patient had a PCN reaction causing immediate rash, facial/tongue/throat swelling, SOB or lightheadedness with hypotension: No Has patient had a PCN reaction causing severe rash involving mucus membranes or skin necrosis: No Has patient had a PCN reaction that required hospitalization No Has patient had a PCN reaction occurring within the last 10 years: No If all of the above answers are "NO", then may proceed with Cephalosporin use.    Patient Active Problem List   Diagnosis Date Noted  . Unstable angina ([place]) 10/28/2017  . CAD (coronary artery disease) 10/28/2017  . Chest pain 04/29/2015  . DDD (degenerative disc disease), lumbar 03/06/2015  . Status post lumbar surgery 03/06/2015  . Lumbar radiculopathy 03/06/2015  . Spinal stenosis of lumbar region 03/06/2015  . Facet syndrome, lumbar 03/06/2015  . Sacroiliac joint dysfunction 03/06/2015  . S/P lumbar spinal fusion 02/14/2013     Past Medical History:  Diagnosis Date  . Anginal pain ([place])   . Arthritis   . COPD (chronic obstructive pulmonary disease) ([place])   . Coronary artery disease   . Diabetes mellitus without complication ([place])   . GERD (gastroesophageal reflux disease)   . Hypertension    dr Humphrey Rolls   [place]  . Shortness of breath      Past Surgical History:  Procedure Laterality Date  . BACK SURGERY     x3  . CARDIAC CATHETERIZATION N/A 09/10/2014   Procedure: Left Heart Cath and Coronary Angiogram ;  Surgeon: Earlyne Iba A  Humphrey Rolls, MD;  Location: [facility];  Service: Cardiovascular;  Laterality: N/A;  . CARDIAC CATHETERIZATION Left 06/26/2015   Procedure: Left Heart Cath and Coronary Angiography;  Surgeon:  Dionisio David, MD;  Location: [facility];  Service: Cardiovascular;  Laterality: Left;  . CORONARY STENT INTERVENTION N/A 10/28/2017   Procedure: CORONARY STENT INTERVENTION;  Surgeon: Wellington Hampshire, MD;  Location: [place] [facility];  Service: Cardiovascular;  Laterality: N/A;  . FETAL SURGERY FOR CONGENITAL HERNIA  2012  . FINGER SURGERY     sixth finger removed  . HERNIA REPAIR    . LEFT HEART CATH AND CORONARY ANGIOGRAPHY Left 10/28/2017   Procedure: LEFT HEART CATH AND CORONARY ANGIOGRAPHY;  Surgeon: Dionisio David, MD;  Location: [facility];  Service: Cardiovascular;  Laterality: Left;  . SUBCLAVIAN ARTERY STENT      Social History   Socioeconomic History  . Marital status: Divorced    Spouse name: Not on file  . Number of children: Not on file  . Years of education: Not on file  . Highest education level: Not on file  Occupational History  . Not on file  Tobacco Use  . Smoking status: Former Smoker    Packs/day: 1.00    Years: 25.00    Pack years: 25.00    Types: Cigarettes    Quit date: 07/04/2015    Years since quitting: 3.6  . Smokeless tobacco: Former Systems developer    Quit date: 07/01/2015  Substance and Sexual Activity  . Alcohol use: No    Alcohol/week: 0.0 standard drinks  . Drug use: No  . Sexual activity: Not on file  Other Topics Concern  . Not on file  Social History Narrative  . Not on file   Social Determinants of Health   Financial Resource Strain:   . Difficulty of Paying Living Expenses: Not on file  Food Insecurity:   . Worried About Charity fundraiser in the Last Year: Not on file  . Ran Out of Food in the Last Year: Not on file  Transportation Needs:   . Lack of Transportation (Medical): Not on file  . Lack of Transportation (Non-Medical): Not on file  Physical Activity:   . Days of Exercise per Week: Not on file  . Minutes of Exercise per Session: Not on file  Stress:   . Feeling of Stress : Not on file  Social  Connections:   . Frequency of Communication with Friends and Family: Not on file  . Frequency of Social Gatherings with Friends and Family: Not on file  . Attends Religious Services: Not on file  . Active Member of Clubs or Organizations: Not on file  . Attends Archivist Meetings: Not on file  . Marital Status: Not on file  Intimate Partner Violence:   . Fear of Current or Ex-Partner: Not on file  . Emotionally Abused: Not on file  . Physically Abused: Not on file  . Sexually Abused: Not on file     Family History  Problem Relation Age of Onset  . Cancer Mother   . Diabetes Mother   . Anuerysm Father      Current Outpatient Medications:  .  albuterol (PROVENTIL HFA;VENTOLIN HFA) 108 (90 BASE) MCG/ACT inhaler, Inhale 2 puffs into the lungs every 6 (six) hours as needed for wheezing or shortness of breath. , Disp: , Rfl:  .  aspirin EC 81 MG tablet, Take 81 mg by mouth  daily., Disp: , Rfl:  .  atorvastatin (LIPITOR) 80 MG tablet, Take 80 mg by mouth daily., Disp: , Rfl:  .  cetirizine (ZYRTEC) 10 MG tablet, Take 10 mg by mouth daily., Disp: , Rfl:  .  empagliflozin (JARDIANCE) 25 MG TABS tablet, Take 25 mg by mouth daily., Disp: , Rfl:  .  gabapentin (NEURONTIN) 600 MG tablet, Take 600 mg by mouth 2 (two) times daily., Disp: , Rfl:  .  glipiZIDE (GLUCOTROL) 5 MG tablet, Take 5 mg by mouth 2 (two) times daily before a meal., Disp: , Rfl:  .  Icosapent Ethyl (VASCEPA) 1 g CAPS, Take 2 g by mouth 2 (two) times daily., Disp: , Rfl:  .  Insulin Degludec (TRESIBA FLEXTOUCH) 100 UNIT/ML SOPN, Inject 26 Units into the skin at bedtime. , Disp: , Rfl:  .  ipratropium-albuterol (DUONEB) 0.5-2.5 (3) MG/3ML SOLN, Take 3 mLs by nebulization., Disp: , Rfl:  .  isosorbide mononitrate (IMDUR) 60 MG 24 hr tablet, Take 60 mg by mouth 2 (two) times daily., Disp: , Rfl:  .  nitroGLYCERIN (NITROSTAT) 0.4 MG SL tablet, Place 0.4 mg under the tongue every 5 (five) minutes as needed for chest  pain., Disp: , Rfl:  .  oxyCODONE ER (XTAMPZA ER) 9 MG C12A, Take by mouth., Disp: , Rfl:  .  pantoprazole (PROTONIX) 40 MG tablet, Take 40 mg by mouth daily., Disp: , Rfl:  .  ranolazine (RANEXA) 1000 MG SR tablet, Take 1,000 mg by mouth 2 (two) times daily., Disp: , Rfl:  .  tamsulosin (FLOMAX) 0.4 MG CAPS capsule, Take 0.4 mg by mouth daily., Disp: , Rfl:  .  ticagrelor (BRILINTA) 90 MG TABS tablet, Take 1 tablet (90 mg total) by mouth 2 (two) times daily., Disp: 60 tablet, Rfl: 2 .  tiZANidine (ZANAFLEX) 4 MG tablet, Take 4 mg by mouth every 8 (eight) hours as needed for muscle spasms., Disp: , Rfl:  .  umeclidinium bromide (INCRUSE ELLIPTA) 62.5 MCG/INH AEPB, Inhale 1 puff into the lungs daily., Disp: , Rfl:    Physical exam:  Vitals:   03/12/19 1406  BP: (!) 117/91  Pulse: 90  Temp: (!) 96.3 F (35.7 C)  TempSrc: Tympanic  SpO2: 99%  Weight: 199 lb (90.3 kg)  Height: 5\' 10"  (1.778 m)   Physical Exam HENT:     Head: Normocephalic and atraumatic.  Eyes:     Pupils: Pupils are equal, round, and reactive to light.  Cardiovascular:     Rate and Rhythm: Normal rate and regular rhythm.     Heart sounds: Normal heart sounds.  Pulmonary:     Effort: Pulmonary effort is normal.     Breath sounds: Normal breath sounds.  Abdominal:     General: Bowel sounds are normal.     Palpations: Abdomen is soft.     Comments: No palpable splenomegaly  Musculoskeletal:     Cervical back: Normal range of motion.  Skin:    General: Skin is warm and dry.  Neurological:     Mental Status: He is alert and oriented to person, place, and time.        CMP Latest Ref Rng & Units 10/29/2017  Glucose 70 - 99 mg/dL 103(H)  BUN 6 - 20 mg/dL 19  Creatinine 0.61 - 1.24 mg/dL 1.31(H)  Sodium 135 - 145 mmol/L 140  Potassium 3.5 - 5.1 mmol/L 4.3  Chloride 98 - 111 mmol/L 110  CO2 22 - 32 mmol/L 24  Calcium 8.9 -  10.3 mg/dL 9.1  Total Protein 6.5 - 8.1 g/dL -  Total Bilirubin 0.3 - 1.2 mg/dL -    Alkaline Phos 38 - 126 U/L -  AST 15 - 41 U/L -  ALT 17 - 63 U/L -   CBC Latest Ref Rng & Units 03/12/2019  WBC 4.0 - 10.5 K/uL 8.6  Hemoglobin 13.0 - 17.0 g/dL 16.4  Hematocrit 39.0 - 52.0 % 50.8  Platelets 150 - 400 K/uL 139(L)    Assessment and plan- Patient is a 54 y.o. male referred for thrombocytopenia  Patient has mild isolated thrombocytopenia with a normal white cell count and hemoglobin.  He has had mildly low platelet counts between 120s to 130s over the last 4 years as well.  No known liver disease.  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  Today I will check a CBC with differential, smear review, B12, folate , HIV and hepatitis C testing.  Video visit with patient in 2 weeks time to discuss the results of blood work and further management   Thank you for this kind referral and the opportunity to participate in the care of this  Patient   Visit Diagnosis 1. Thrombocytopenia ([place])     Dr. Randa Evens, MD, MPH [facility] [id number] 03/13/2019 11:26 AM

## 2019-03-15 DIAGNOSIS — J449 Chronic obstructive pulmonary disease, unspecified: Secondary | ICD-10-CM | POA: Diagnosis not present

## 2019-03-26 ENCOUNTER — Inpatient Hospital Stay (HOSPITAL_BASED_OUTPATIENT_CLINIC_OR_DEPARTMENT_OTHER): Payer: Medicare HMO | Admitting: Oncology

## 2019-03-26 ENCOUNTER — Encounter: Payer: Self-pay | Admitting: Oncology

## 2019-03-26 ENCOUNTER — Other Ambulatory Visit: Payer: Self-pay | Admitting: *Deleted

## 2019-03-26 DIAGNOSIS — D696 Thrombocytopenia, unspecified: Secondary | ICD-10-CM | POA: Diagnosis not present

## 2019-03-26 NOTE — Progress Notes (Signed)
F/u video after having labs

## 2019-03-28 NOTE — Progress Notes (Signed)
I connected with Jose Carroll on 03/28/19 at  2:00 PM EDT by video enabled telemedicine visit and verified that I am speaking with the correct person using two identifiers.   I discussed the limitations, risks, security and privacy concerns of performing an evaluation and management service by telemedicine and the availability of in-person appointments. I also discussed with the patient that there may be a patient responsible charge related to this service. The patient expressed understanding and agreed to proceed.  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  He has been referred to [place] for thrombocytopenia. On review of his prior labs patient has had mild intermittent thrombocytopenia with a platelet count between 120s to 130s between 20 13-20 19.  Most recent CBC in February 2021 showed a white count of 7.7, hemoglobin of 15.4 and a platelet count of 136.  Liver function tests were normal.  Patient denies any history of chronic liver disease or alcohol consumption.   Results of blood work from 2021 were as follows: CBC showed white count of 8.6, H&H of 16.4/50.8 and a platelet count of 139.  HIV and hepatitis C testing are unremarkable.  B12 and folate were normal.  Smear review was unremarkable.    Interval history no acute issues since last visit   Review of Systems  Constitutional: Negative for chills, fever, malaise/fatigue and weight loss.  HENT: Negative for congestion, ear discharge and nosebleeds.   Eyes: Negative for blurred vision.  Respiratory: Negative for cough, hemoptysis, sputum production, shortness of  breath and wheezing.   Cardiovascular: Negative for chest pain, palpitations, orthopnea and claudication.  Gastrointestinal: Negative for abdominal pain, blood in stool, constipation, diarrhea, heartburn, melena, nausea and vomiting.  Genitourinary: Negative for dysuria, flank pain, frequency, hematuria and urgency.  Musculoskeletal: Negative for back pain, joint pain and myalgias.  Skin: Negative for rash.  Neurological: Negative for dizziness, tingling, focal weakness, seizures, weakness and headaches.  Endo/Heme/Allergies: Does not bruise/bleed easily.  Psychiatric/Behavioral: Negative for depression and suicidal ideas. The patient does not have insomnia.     Allergies  Allergen Reactions  . Naproxen Rash  . Penicillins Hives and Other (See Comments)    Has patient had a PCN reaction causing immediate rash, facial/tongue/throat swelling, SOB or lightheadedness with hypotension: No Has patient had a PCN reaction causing severe rash involving mucus membranes or skin necrosis: No Has patient had a PCN reaction that required hospitalization No Has patient had a PCN reaction occurring within the last 10 years: No If all of the above answers are "NO", then may proceed with Cephalosporin use.    Past Medical History:  Diagnosis Date  . Anginal pain ([place])   . Arthritis   . COPD (chronic obstructive pulmonary disease) ([place])   . Coronary artery disease   . Diabetes mellitus without complication ([place])   . GERD (gastroesophageal reflux disease)   . Hypertension    dr Humphrey Rolls   [place]  . Shortness of breath     Past Surgical History:  Procedure Laterality Date  . BACK SURGERY     x3  . CARDIAC CATHETERIZATION N/A 09/10/2014   Procedure: Left Heart Cath and Coronary Angiogram ;  Surgeon: Dionisio David, MD;  Location: [facility];  Service: Cardiovascular;  Laterality: N/A;  . CARDIAC CATHETERIZATION Left 06/26/2015   Procedure: Left Heart Cath and Coronary Angiography;   Surgeon: Dionisio David, MD;  Location: [facility];  Service: Cardiovascular;  Laterality: Left;  . CORONARY STENT INTERVENTION N/A 10/28/2017   Procedure: CORONARY STENT INTERVENTION;  Surgeon: Wellington Hampshire, MD;  Location: [facility];  Service: Cardiovascular;  Laterality: N/A;  . FETAL SURGERY FOR CONGENITAL HERNIA  2012  . FINGER SURGERY     sixth finger removed  . HERNIA REPAIR    . LEFT HEART CATH AND CORONARY ANGIOGRAPHY Left 10/28/2017   Procedure: LEFT HEART CATH AND CORONARY ANGIOGRAPHY;  Surgeon: Dionisio David, MD;  Location: [place];  Service: Cardiovascular;  Laterality: Left;  . SUBCLAVIAN ARTERY STENT      Social History   Socioeconomic History  . Marital status: Divorced    Spouse name: Not on file  . Number of children: Not on file  . Years of education: Not on file  . Highest education level: Not on file  Occupational History  . Not on file  Tobacco Use  . Smoking status: Former Smoker    Packs/day: 1.00    Years: 25.00    Pack years: 25.00    Types: Cigarettes    Quit date: 07/04/2015    Years since quitting: 3.7  . Smokeless tobacco: Former Systems developer    Quit date: 07/01/2015  Substance and Sexual Activity  . Alcohol use: No    Alcohol/week: 0.0 standard drinks  . Drug use: No  . Sexual activity: Not on file  Other Topics Concern  . Not on file  Social History Narrative  . Not on file   Social Determinants of Health   Financial Resource Strain:   . Difficulty of Paying Living Expenses:   Food Insecurity:   . Worried About Charity fundraiser in the Last Year:   . Arboriculturist in the Last Year:   Transportation Needs:   . Film/video editor (Medical):   Marland Kitchen Lack of Transportation (Non-Medical):   Physical Activity:   . Days of Exercise per Week:   . Minutes of Exercise per Session:   Stress:   . Feeling of Stress :   Social Connections:   . Frequency of Communication with Friends and Family:   .  Frequency of Social Gatherings with Friends and Family:   . Attends Religious Services:   . Active Member of Clubs or Organizations:   . Attends Archivist Meetings:   Marland Kitchen Marital Status:   Intimate Partner Violence:   . Fear of Current or Ex-Partner:   . Emotionally Abused:   Marland Kitchen Physically Abused:   . Sexually Abused:     Family History  Problem Relation Age of Onset  . Cancer Mother   . Diabetes Mother   . Anuerysm Father      Current Outpatient Medications:  .  albuterol (PROVENTIL HFA;VENTOLIN HFA) 108 (90 BASE) MCG/ACT inhaler, Inhale 2 puffs into the lungs every 6 (six) hours as needed for wheezing or shortness of breath. , Disp: , Rfl:  .  aspirin EC 81 MG tablet, Take 81 mg by mouth daily., Disp: , Rfl:  .  atorvastatin (LIPITOR) 80 MG tablet, Take 80 mg by mouth daily., Disp: , Rfl:  .  cetirizine (ZYRTEC) 10 MG tablet, Take 10 mg by mouth  daily., Disp: , Rfl:  .  empagliflozin (JARDIANCE) 25 MG TABS tablet, Take 25 mg by mouth daily., Disp: , Rfl:  .  gabapentin (NEURONTIN) 600 MG tablet, Take 1,200 mg by mouth 3 (three) times daily. , Disp: , Rfl:  .  glipiZIDE (GLUCOTROL) 5 MG tablet, Take 5 mg by mouth 2 (two) times daily before a meal., Disp: , Rfl:  .  Icosapent Ethyl (VASCEPA) 1 g CAPS, Take 2 g by mouth 2 (two) times daily., Disp: , Rfl:  .  Insulin Degludec (TRESIBA FLEXTOUCH) 100 UNIT/ML SOPN, Inject 26 Units into the skin at bedtime. , Disp: , Rfl:  .  ipratropium-albuterol (DUONEB) 0.5-2.5 (3) MG/3ML SOLN, Take 3 mLs by nebulization every 6 (six) hours as needed. , Disp: , Rfl:  .  isosorbide mononitrate (IMDUR) 60 MG 24 hr tablet, Take 120 mg by mouth at bedtime. , Disp: , Rfl:  .  nitroGLYCERIN (NITROSTAT) 0.4 MG SL tablet, Place 0.4 mg under the tongue every 5 (five) minutes as needed for chest pain., Disp: , Rfl:  .  oxyCODONE ER (XTAMPZA ER) 9 MG C12A, Take 9 mg by mouth every 12 (twelve) hours. , Disp: , Rfl:  .  pantoprazole (PROTONIX) 40 MG  tablet, Take 40 mg by mouth daily., Disp: , Rfl:  .  ranolazine (RANEXA) 1000 MG SR tablet, Take 1,000 mg by mouth 2 (two) times daily., Disp: , Rfl:  .  tamsulosin (FLOMAX) 0.4 MG CAPS capsule, Take 0.4 mg by mouth daily., Disp: , Rfl:  .  ticagrelor (BRILINTA) 90 MG TABS tablet, Take 1 tablet (90 mg total) by mouth 2 (two) times daily. (Patient taking differently: Take 60 mg by mouth 2 (two) times daily. ), Disp: 60 tablet, Rfl: 2 .  tiZANidine (ZANAFLEX) 4 MG tablet, Take 4 mg by mouth every 8 (eight) hours as needed for muscle spasms., Disp: , Rfl:  .  umeclidinium bromide (INCRUSE ELLIPTA) 62.5 MCG/INH AEPB, Inhale 1 puff into the lungs in the morning and at bedtime. , Disp: , Rfl:   No results found.  No images are attached to the encounter.   CMP Latest Ref Rng & Units 10/29/2017  Glucose 70 - 99 mg/dL 103(H)  BUN 6 - 20 mg/dL 19  Creatinine 0.61 - 1.24 mg/dL 1.31(H)  Sodium 135 - 145 mmol/L 140  Potassium 3.5 - 5.1 mmol/L 4.3  Chloride 98 - 111 mmol/L 110  CO2 22 - 32 mmol/L 24  Calcium 8.9 - 10.3 mg/dL 9.1  Total Protein 6.5 - 8.1 g/dL -  Total Bilirubin 0.3 - 1.2 mg/dL -  Alkaline Phos 38 - 126 U/L -  AST 15 - 41 U/L -  ALT 17 - 63 U/L -   CBC Latest Ref Rng & Units 03/12/2019  WBC 4.0 - 10.5 K/uL 8.6  Hemoglobin 13.0 - 17.0 g/dL 16.4  Hematocrit 39.0 - 52.0 % 50.8  Platelets 150 - 400 K/uL 139(L)     Observation/objective:appears in no acute distress over video visit today. 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  B12 folate HIV and hep C testing were unremarkable.I suspect this is secondary to ITP which can be monitored conservatively without any intervention at this time.  I will see him back in 1 years time for repeat CBC with differential  Follow-up  instructions:  I discussed the assessment and treatment plan with the patient. The patient was provided an opportunity to ask questions and all were answered. The patient agreed with the plan and demonstrated an understanding of the instructions.   The patient was advised to call back or seek an in-person evaluation if the symptoms worsen or if the condition fails to improve as anticipated.  Visit Diagnosis: No diagnosis found.  Dr. Randa Evens, MD, MPH [place] at [facility] Tel- [id number] 03/28/2019 1:24 PM

## 2019-04-02 DIAGNOSIS — G8928 Other chronic postprocedural pain: Secondary | ICD-10-CM | POA: Diagnosis not present

## 2019-04-02 DIAGNOSIS — M545 Low back pain: Secondary | ICD-10-CM | POA: Diagnosis not present

## 2019-04-02 DIAGNOSIS — G894 Chronic pain syndrome: Secondary | ICD-10-CM | POA: Diagnosis not present

## 2019-04-02 DIAGNOSIS — M961 Postlaminectomy syndrome, not elsewhere classified: Secondary | ICD-10-CM | POA: Diagnosis not present

## 2019-04-02 DIAGNOSIS — G8929 Other chronic pain: Secondary | ICD-10-CM | POA: Diagnosis not present

## 2019-04-02 DIAGNOSIS — I10 Essential (primary) hypertension: Secondary | ICD-10-CM | POA: Diagnosis not present

## 2019-04-02 DIAGNOSIS — J449 Chronic obstructive pulmonary disease, unspecified: Secondary | ICD-10-CM | POA: Diagnosis not present

## 2019-04-02 DIAGNOSIS — Z79899 Other long term (current) drug therapy: Secondary | ICD-10-CM | POA: Diagnosis not present

## 2019-04-02 DIAGNOSIS — M5440 Lumbago with sciatica, unspecified side: Secondary | ICD-10-CM | POA: Diagnosis not present

## 2019-04-14 DIAGNOSIS — E119 Type 2 diabetes mellitus without complications: Secondary | ICD-10-CM | POA: Diagnosis not present

## 2019-04-26 DIAGNOSIS — J449 Chronic obstructive pulmonary disease, unspecified: Secondary | ICD-10-CM | POA: Diagnosis not present

## 2019-05-02 DIAGNOSIS — E113212 Type 2 diabetes mellitus with mild nonproliferative diabetic retinopathy with macular edema, left eye: Secondary | ICD-10-CM | POA: Diagnosis not present

## 2019-05-02 DIAGNOSIS — I10 Essential (primary) hypertension: Secondary | ICD-10-CM | POA: Diagnosis not present

## 2019-05-02 DIAGNOSIS — E785 Hyperlipidemia, unspecified: Secondary | ICD-10-CM | POA: Diagnosis not present

## 2019-05-04 DIAGNOSIS — I10 Essential (primary) hypertension: Secondary | ICD-10-CM | POA: Diagnosis not present

## 2019-05-04 DIAGNOSIS — G603 Idiopathic progressive neuropathy: Secondary | ICD-10-CM | POA: Diagnosis not present

## 2019-05-04 DIAGNOSIS — E113212 Type 2 diabetes mellitus with mild nonproliferative diabetic retinopathy with macular edema, left eye: Secondary | ICD-10-CM | POA: Diagnosis not present

## 2019-05-04 DIAGNOSIS — R55 Syncope and collapse: Secondary | ICD-10-CM | POA: Diagnosis not present

## 2019-05-04 DIAGNOSIS — J449 Chronic obstructive pulmonary disease, unspecified: Secondary | ICD-10-CM | POA: Diagnosis not present

## 2019-05-04 DIAGNOSIS — N400 Enlarged prostate without lower urinary tract symptoms: Secondary | ICD-10-CM | POA: Diagnosis not present

## 2019-05-04 DIAGNOSIS — R69 Illness, unspecified: Secondary | ICD-10-CM | POA: Diagnosis not present

## 2019-05-04 DIAGNOSIS — N189 Chronic kidney disease, unspecified: Secondary | ICD-10-CM | POA: Diagnosis not present

## 2019-05-04 DIAGNOSIS — E1143 Type 2 diabetes mellitus with diabetic autonomic (poly)neuropathy: Secondary | ICD-10-CM | POA: Diagnosis not present

## 2019-05-04 DIAGNOSIS — E785 Hyperlipidemia, unspecified: Secondary | ICD-10-CM | POA: Diagnosis not present

## 2019-05-11 DIAGNOSIS — I2510 Atherosclerotic heart disease of native coronary artery without angina pectoris: Secondary | ICD-10-CM | POA: Diagnosis not present

## 2019-05-11 DIAGNOSIS — K2100 Gastro-esophageal reflux disease with esophagitis, without bleeding: Secondary | ICD-10-CM | POA: Diagnosis not present

## 2019-05-11 DIAGNOSIS — E785 Hyperlipidemia, unspecified: Secondary | ICD-10-CM | POA: Diagnosis not present

## 2019-05-11 DIAGNOSIS — J449 Chronic obstructive pulmonary disease, unspecified: Secondary | ICD-10-CM | POA: Diagnosis not present

## 2019-05-11 DIAGNOSIS — I10 Essential (primary) hypertension: Secondary | ICD-10-CM | POA: Diagnosis not present

## 2019-05-14 DIAGNOSIS — R69 Illness, unspecified: Secondary | ICD-10-CM | POA: Diagnosis not present

## 2019-05-15 DIAGNOSIS — R55 Syncope and collapse: Secondary | ICD-10-CM | POA: Diagnosis not present

## 2019-05-16 DIAGNOSIS — Z1211 Encounter for screening for malignant neoplasm of colon: Secondary | ICD-10-CM | POA: Diagnosis not present

## 2019-05-16 DIAGNOSIS — Z1212 Encounter for screening for malignant neoplasm of rectum: Secondary | ICD-10-CM | POA: Diagnosis not present

## 2019-05-24 DIAGNOSIS — I2510 Atherosclerotic heart disease of native coronary artery without angina pectoris: Secondary | ICD-10-CM | POA: Diagnosis not present

## 2019-05-28 DIAGNOSIS — G8928 Other chronic postprocedural pain: Secondary | ICD-10-CM | POA: Diagnosis not present

## 2019-05-28 DIAGNOSIS — M545 Low back pain: Secondary | ICD-10-CM | POA: Diagnosis not present

## 2019-05-28 DIAGNOSIS — I10 Essential (primary) hypertension: Secondary | ICD-10-CM | POA: Diagnosis not present

## 2019-05-28 DIAGNOSIS — J449 Chronic obstructive pulmonary disease, unspecified: Secondary | ICD-10-CM | POA: Diagnosis not present

## 2019-05-28 DIAGNOSIS — G8929 Other chronic pain: Secondary | ICD-10-CM | POA: Diagnosis not present

## 2019-05-28 DIAGNOSIS — F1120 Opioid dependence, uncomplicated: Secondary | ICD-10-CM | POA: Diagnosis not present

## 2019-05-28 DIAGNOSIS — M5440 Lumbago with sciatica, unspecified side: Secondary | ICD-10-CM | POA: Diagnosis not present

## 2019-05-28 DIAGNOSIS — G894 Chronic pain syndrome: Secondary | ICD-10-CM | POA: Diagnosis not present

## 2019-05-28 DIAGNOSIS — Z79899 Other long term (current) drug therapy: Secondary | ICD-10-CM | POA: Diagnosis not present

## 2019-05-28 DIAGNOSIS — M961 Postlaminectomy syndrome, not elsewhere classified: Secondary | ICD-10-CM | POA: Diagnosis not present

## 2019-05-30 DIAGNOSIS — J449 Chronic obstructive pulmonary disease, unspecified: Secondary | ICD-10-CM | POA: Diagnosis not present

## 2019-05-31 DIAGNOSIS — R0602 Shortness of breath: Secondary | ICD-10-CM | POA: Diagnosis not present

## 2019-05-31 DIAGNOSIS — I10 Essential (primary) hypertension: Secondary | ICD-10-CM | POA: Diagnosis not present

## 2019-05-31 DIAGNOSIS — K2100 Gastro-esophageal reflux disease with esophagitis, without bleeding: Secondary | ICD-10-CM | POA: Diagnosis not present

## 2019-05-31 DIAGNOSIS — R079 Chest pain, unspecified: Secondary | ICD-10-CM | POA: Diagnosis not present

## 2019-05-31 DIAGNOSIS — E785 Hyperlipidemia, unspecified: Secondary | ICD-10-CM | POA: Diagnosis not present

## 2019-05-31 DIAGNOSIS — I2510 Atherosclerotic heart disease of native coronary artery without angina pectoris: Secondary | ICD-10-CM | POA: Diagnosis not present

## 2019-06-25 DIAGNOSIS — I10 Essential (primary) hypertension: Secondary | ICD-10-CM | POA: Diagnosis not present

## 2019-06-25 DIAGNOSIS — G8929 Other chronic pain: Secondary | ICD-10-CM | POA: Diagnosis not present

## 2019-06-25 DIAGNOSIS — M961 Postlaminectomy syndrome, not elsewhere classified: Secondary | ICD-10-CM | POA: Diagnosis not present

## 2019-06-25 DIAGNOSIS — G8928 Other chronic postprocedural pain: Secondary | ICD-10-CM | POA: Diagnosis not present

## 2019-06-25 DIAGNOSIS — M545 Low back pain: Secondary | ICD-10-CM | POA: Diagnosis not present

## 2019-06-25 DIAGNOSIS — G894 Chronic pain syndrome: Secondary | ICD-10-CM | POA: Diagnosis not present

## 2019-06-25 DIAGNOSIS — J449 Chronic obstructive pulmonary disease, unspecified: Secondary | ICD-10-CM | POA: Diagnosis not present

## 2019-06-25 DIAGNOSIS — Z79899 Other long term (current) drug therapy: Secondary | ICD-10-CM | POA: Diagnosis not present

## 2019-06-26 DIAGNOSIS — J449 Chronic obstructive pulmonary disease, unspecified: Secondary | ICD-10-CM | POA: Diagnosis not present

## 2019-07-10 DIAGNOSIS — K2100 Gastro-esophageal reflux disease with esophagitis, without bleeding: Secondary | ICD-10-CM | POA: Diagnosis not present

## 2019-07-10 DIAGNOSIS — R079 Chest pain, unspecified: Secondary | ICD-10-CM | POA: Diagnosis not present

## 2019-07-10 DIAGNOSIS — E113212 Type 2 diabetes mellitus with mild nonproliferative diabetic retinopathy with macular edema, left eye: Secondary | ICD-10-CM | POA: Diagnosis not present

## 2019-07-10 DIAGNOSIS — I10 Essential (primary) hypertension: Secondary | ICD-10-CM | POA: Diagnosis not present

## 2019-07-10 DIAGNOSIS — E785 Hyperlipidemia, unspecified: Secondary | ICD-10-CM | POA: Diagnosis not present

## 2019-07-10 DIAGNOSIS — J449 Chronic obstructive pulmonary disease, unspecified: Secondary | ICD-10-CM | POA: Diagnosis not present

## 2019-07-10 DIAGNOSIS — Z9861 Coronary angioplasty status: Secondary | ICD-10-CM | POA: Diagnosis not present

## 2019-07-10 DIAGNOSIS — I2510 Atherosclerotic heart disease of native coronary artery without angina pectoris: Secondary | ICD-10-CM | POA: Diagnosis not present

## 2019-07-10 DIAGNOSIS — R69 Illness, unspecified: Secondary | ICD-10-CM | POA: Diagnosis not present

## 2019-07-10 DIAGNOSIS — R0602 Shortness of breath: Secondary | ICD-10-CM | POA: Diagnosis not present

## 2019-07-14 DIAGNOSIS — E119 Type 2 diabetes mellitus without complications: Secondary | ICD-10-CM | POA: Diagnosis not present

## 2019-07-25 DIAGNOSIS — J449 Chronic obstructive pulmonary disease, unspecified: Secondary | ICD-10-CM | POA: Diagnosis not present

## 2019-07-26 DIAGNOSIS — R079 Chest pain, unspecified: Secondary | ICD-10-CM | POA: Diagnosis not present

## 2019-07-26 DIAGNOSIS — I10 Essential (primary) hypertension: Secondary | ICD-10-CM | POA: Diagnosis not present

## 2019-07-26 DIAGNOSIS — K2100 Gastro-esophageal reflux disease with esophagitis, without bleeding: Secondary | ICD-10-CM | POA: Diagnosis not present

## 2019-07-26 DIAGNOSIS — J449 Chronic obstructive pulmonary disease, unspecified: Secondary | ICD-10-CM | POA: Diagnosis not present

## 2019-07-26 DIAGNOSIS — R69 Illness, unspecified: Secondary | ICD-10-CM | POA: Diagnosis not present

## 2019-07-26 DIAGNOSIS — E113212 Type 2 diabetes mellitus with mild nonproliferative diabetic retinopathy with macular edema, left eye: Secondary | ICD-10-CM | POA: Diagnosis not present

## 2019-07-26 DIAGNOSIS — F17200 Nicotine dependence, unspecified, uncomplicated: Secondary | ICD-10-CM | POA: Diagnosis not present

## 2019-07-26 DIAGNOSIS — E785 Hyperlipidemia, unspecified: Secondary | ICD-10-CM | POA: Diagnosis not present

## 2019-07-26 DIAGNOSIS — F17210 Nicotine dependence, cigarettes, uncomplicated: Secondary | ICD-10-CM | POA: Diagnosis not present

## 2019-07-26 DIAGNOSIS — I2510 Atherosclerotic heart disease of native coronary artery without angina pectoris: Secondary | ICD-10-CM | POA: Diagnosis not present

## 2019-07-26 DIAGNOSIS — R0602 Shortness of breath: Secondary | ICD-10-CM | POA: Diagnosis not present

## 2019-07-31 DIAGNOSIS — G894 Chronic pain syndrome: Secondary | ICD-10-CM | POA: Diagnosis not present

## 2019-07-31 DIAGNOSIS — Z79899 Other long term (current) drug therapy: Secondary | ICD-10-CM | POA: Diagnosis not present

## 2019-07-31 DIAGNOSIS — I739 Peripheral vascular disease, unspecified: Secondary | ICD-10-CM | POA: Diagnosis not present

## 2019-08-07 DIAGNOSIS — R69 Illness, unspecified: Secondary | ICD-10-CM | POA: Diagnosis not present

## 2019-08-07 DIAGNOSIS — I2510 Atherosclerotic heart disease of native coronary artery without angina pectoris: Secondary | ICD-10-CM | POA: Diagnosis not present

## 2019-08-07 DIAGNOSIS — I10 Essential (primary) hypertension: Secondary | ICD-10-CM | POA: Diagnosis not present

## 2019-08-07 DIAGNOSIS — Z9861 Coronary angioplasty status: Secondary | ICD-10-CM | POA: Diagnosis not present

## 2019-08-07 DIAGNOSIS — K2100 Gastro-esophageal reflux disease with esophagitis, without bleeding: Secondary | ICD-10-CM | POA: Diagnosis not present

## 2019-08-07 DIAGNOSIS — E785 Hyperlipidemia, unspecified: Secondary | ICD-10-CM | POA: Diagnosis not present

## 2019-08-08 DIAGNOSIS — N400 Enlarged prostate without lower urinary tract symptoms: Secondary | ICD-10-CM | POA: Diagnosis not present

## 2019-08-08 DIAGNOSIS — I10 Essential (primary) hypertension: Secondary | ICD-10-CM | POA: Diagnosis not present

## 2019-08-08 DIAGNOSIS — G603 Idiopathic progressive neuropathy: Secondary | ICD-10-CM | POA: Diagnosis not present

## 2019-08-08 DIAGNOSIS — E113212 Type 2 diabetes mellitus with mild nonproliferative diabetic retinopathy with macular edema, left eye: Secondary | ICD-10-CM | POA: Diagnosis not present

## 2019-08-08 DIAGNOSIS — E785 Hyperlipidemia, unspecified: Secondary | ICD-10-CM | POA: Diagnosis not present

## 2019-08-08 DIAGNOSIS — N189 Chronic kidney disease, unspecified: Secondary | ICD-10-CM | POA: Diagnosis not present

## 2019-08-10 DIAGNOSIS — E1165 Type 2 diabetes mellitus with hyperglycemia: Secondary | ICD-10-CM | POA: Diagnosis not present

## 2019-08-10 DIAGNOSIS — E559 Vitamin D deficiency, unspecified: Secondary | ICD-10-CM | POA: Diagnosis not present

## 2019-08-10 DIAGNOSIS — N189 Chronic kidney disease, unspecified: Secondary | ICD-10-CM | POA: Diagnosis not present

## 2019-08-10 DIAGNOSIS — E785 Hyperlipidemia, unspecified: Secondary | ICD-10-CM | POA: Diagnosis not present

## 2019-08-10 DIAGNOSIS — J449 Chronic obstructive pulmonary disease, unspecified: Secondary | ICD-10-CM | POA: Diagnosis not present

## 2019-08-10 DIAGNOSIS — I10 Essential (primary) hypertension: Secondary | ICD-10-CM | POA: Diagnosis not present

## 2019-08-20 DIAGNOSIS — Z79899 Other long term (current) drug therapy: Secondary | ICD-10-CM | POA: Diagnosis not present

## 2019-08-20 DIAGNOSIS — G894 Chronic pain syndrome: Secondary | ICD-10-CM | POA: Diagnosis not present

## 2019-08-20 DIAGNOSIS — G8929 Other chronic pain: Secondary | ICD-10-CM | POA: Diagnosis not present

## 2019-08-20 DIAGNOSIS — M545 Low back pain: Secondary | ICD-10-CM | POA: Diagnosis not present

## 2019-08-20 DIAGNOSIS — I10 Essential (primary) hypertension: Secondary | ICD-10-CM | POA: Diagnosis not present

## 2019-08-20 DIAGNOSIS — M961 Postlaminectomy syndrome, not elsewhere classified: Secondary | ICD-10-CM | POA: Diagnosis not present

## 2019-08-20 DIAGNOSIS — J449 Chronic obstructive pulmonary disease, unspecified: Secondary | ICD-10-CM | POA: Diagnosis not present

## 2019-08-20 DIAGNOSIS — G8928 Other chronic postprocedural pain: Secondary | ICD-10-CM | POA: Diagnosis not present

## 2019-09-04 DIAGNOSIS — J449 Chronic obstructive pulmonary disease, unspecified: Secondary | ICD-10-CM | POA: Diagnosis not present

## 2019-10-03 DIAGNOSIS — J449 Chronic obstructive pulmonary disease, unspecified: Secondary | ICD-10-CM | POA: Diagnosis not present

## 2019-10-04 DIAGNOSIS — Z79891 Long term (current) use of opiate analgesic: Secondary | ICD-10-CM | POA: Diagnosis not present

## 2019-10-04 DIAGNOSIS — G894 Chronic pain syndrome: Secondary | ICD-10-CM | POA: Diagnosis not present

## 2019-10-10 DIAGNOSIS — I2510 Atherosclerotic heart disease of native coronary artery without angina pectoris: Secondary | ICD-10-CM | POA: Diagnosis not present

## 2019-10-10 DIAGNOSIS — Z9861 Coronary angioplasty status: Secondary | ICD-10-CM | POA: Diagnosis not present

## 2019-10-10 DIAGNOSIS — I209 Angina pectoris, unspecified: Secondary | ICD-10-CM | POA: Diagnosis not present

## 2019-10-10 DIAGNOSIS — R69 Illness, unspecified: Secondary | ICD-10-CM | POA: Diagnosis not present

## 2019-10-10 DIAGNOSIS — I10 Essential (primary) hypertension: Secondary | ICD-10-CM | POA: Diagnosis not present

## 2019-10-10 DIAGNOSIS — E785 Hyperlipidemia, unspecified: Secondary | ICD-10-CM | POA: Diagnosis not present

## 2019-10-13 DIAGNOSIS — E119 Type 2 diabetes mellitus without complications: Secondary | ICD-10-CM | POA: Diagnosis not present

## 2019-10-15 DIAGNOSIS — M961 Postlaminectomy syndrome, not elsewhere classified: Secondary | ICD-10-CM | POA: Diagnosis not present

## 2019-10-15 DIAGNOSIS — J449 Chronic obstructive pulmonary disease, unspecified: Secondary | ICD-10-CM | POA: Diagnosis not present

## 2019-10-15 DIAGNOSIS — M545 Low back pain, unspecified: Secondary | ICD-10-CM | POA: Diagnosis not present

## 2019-10-15 DIAGNOSIS — G8928 Other chronic postprocedural pain: Secondary | ICD-10-CM | POA: Diagnosis not present

## 2019-10-15 DIAGNOSIS — I10 Essential (primary) hypertension: Secondary | ICD-10-CM | POA: Diagnosis not present

## 2019-10-15 DIAGNOSIS — G894 Chronic pain syndrome: Secondary | ICD-10-CM | POA: Diagnosis not present

## 2019-10-15 DIAGNOSIS — G8929 Other chronic pain: Secondary | ICD-10-CM | POA: Diagnosis not present

## 2019-11-06 DIAGNOSIS — J449 Chronic obstructive pulmonary disease, unspecified: Secondary | ICD-10-CM | POA: Diagnosis not present

## 2019-11-11 DIAGNOSIS — Z79891 Long term (current) use of opiate analgesic: Secondary | ICD-10-CM | POA: Diagnosis not present

## 2019-11-11 DIAGNOSIS — G894 Chronic pain syndrome: Secondary | ICD-10-CM | POA: Diagnosis not present

## 2019-11-19 DIAGNOSIS — N189 Chronic kidney disease, unspecified: Secondary | ICD-10-CM | POA: Diagnosis not present

## 2019-11-19 DIAGNOSIS — E785 Hyperlipidemia, unspecified: Secondary | ICD-10-CM | POA: Diagnosis not present

## 2019-11-19 DIAGNOSIS — I10 Essential (primary) hypertension: Secondary | ICD-10-CM | POA: Diagnosis not present

## 2019-11-21 DIAGNOSIS — R609 Edema, unspecified: Secondary | ICD-10-CM | POA: Diagnosis not present

## 2019-11-21 DIAGNOSIS — G894 Chronic pain syndrome: Secondary | ICD-10-CM | POA: Diagnosis not present

## 2019-11-21 DIAGNOSIS — M545 Low back pain, unspecified: Secondary | ICD-10-CM | POA: Diagnosis not present

## 2019-11-21 DIAGNOSIS — M961 Postlaminectomy syndrome, not elsewhere classified: Secondary | ICD-10-CM | POA: Diagnosis not present

## 2019-11-21 DIAGNOSIS — E118 Type 2 diabetes mellitus with unspecified complications: Secondary | ICD-10-CM | POA: Diagnosis not present

## 2019-11-21 DIAGNOSIS — N189 Chronic kidney disease, unspecified: Secondary | ICD-10-CM | POA: Diagnosis not present

## 2019-11-21 DIAGNOSIS — G8928 Other chronic postprocedural pain: Secondary | ICD-10-CM | POA: Diagnosis not present

## 2019-11-21 DIAGNOSIS — J449 Chronic obstructive pulmonary disease, unspecified: Secondary | ICD-10-CM | POA: Diagnosis not present

## 2019-11-21 DIAGNOSIS — I10 Essential (primary) hypertension: Secondary | ICD-10-CM | POA: Diagnosis not present

## 2019-11-21 DIAGNOSIS — M5440 Lumbago with sciatica, unspecified side: Secondary | ICD-10-CM | POA: Diagnosis not present

## 2019-11-21 DIAGNOSIS — E785 Hyperlipidemia, unspecified: Secondary | ICD-10-CM | POA: Diagnosis not present

## 2019-12-24 DIAGNOSIS — J449 Chronic obstructive pulmonary disease, unspecified: Secondary | ICD-10-CM | POA: Diagnosis not present

## 2019-12-29 DIAGNOSIS — G894 Chronic pain syndrome: Secondary | ICD-10-CM | POA: Diagnosis not present

## 2019-12-29 DIAGNOSIS — Z79899 Other long term (current) drug therapy: Secondary | ICD-10-CM | POA: Diagnosis not present

## 2019-12-31 DIAGNOSIS — M961 Postlaminectomy syndrome, not elsewhere classified: Secondary | ICD-10-CM | POA: Diagnosis not present

## 2019-12-31 DIAGNOSIS — G8928 Other chronic postprocedural pain: Secondary | ICD-10-CM | POA: Diagnosis not present

## 2019-12-31 DIAGNOSIS — I10 Essential (primary) hypertension: Secondary | ICD-10-CM | POA: Diagnosis not present

## 2019-12-31 DIAGNOSIS — G8929 Other chronic pain: Secondary | ICD-10-CM | POA: Diagnosis not present

## 2019-12-31 DIAGNOSIS — G894 Chronic pain syndrome: Secondary | ICD-10-CM | POA: Diagnosis not present

## 2019-12-31 DIAGNOSIS — M5440 Lumbago with sciatica, unspecified side: Secondary | ICD-10-CM | POA: Diagnosis not present

## 2019-12-31 DIAGNOSIS — M545 Low back pain, unspecified: Secondary | ICD-10-CM | POA: Diagnosis not present

## 2019-12-31 DIAGNOSIS — J449 Chronic obstructive pulmonary disease, unspecified: Secondary | ICD-10-CM | POA: Diagnosis not present

## 2020-01-09 DIAGNOSIS — R609 Edema, unspecified: Secondary | ICD-10-CM | POA: Diagnosis not present

## 2020-01-09 DIAGNOSIS — E785 Hyperlipidemia, unspecified: Secondary | ICD-10-CM | POA: Diagnosis not present

## 2020-01-09 DIAGNOSIS — F17200 Nicotine dependence, unspecified, uncomplicated: Secondary | ICD-10-CM | POA: Diagnosis not present

## 2020-01-09 DIAGNOSIS — Z9861 Coronary angioplasty status: Secondary | ICD-10-CM | POA: Diagnosis not present

## 2020-01-09 DIAGNOSIS — E781 Pure hyperglyceridemia: Secondary | ICD-10-CM | POA: Diagnosis not present

## 2020-01-09 DIAGNOSIS — I2510 Atherosclerotic heart disease of native coronary artery without angina pectoris: Secondary | ICD-10-CM | POA: Diagnosis not present

## 2020-01-09 DIAGNOSIS — I10 Essential (primary) hypertension: Secondary | ICD-10-CM | POA: Diagnosis not present

## 2020-01-09 DIAGNOSIS — R69 Illness, unspecified: Secondary | ICD-10-CM | POA: Diagnosis not present

## 2020-01-14 DIAGNOSIS — E119 Type 2 diabetes mellitus without complications: Secondary | ICD-10-CM | POA: Diagnosis not present

## 2020-01-18 DIAGNOSIS — J449 Chronic obstructive pulmonary disease, unspecified: Secondary | ICD-10-CM | POA: Diagnosis not present

## 2020-02-07 DIAGNOSIS — Z79891 Long term (current) use of opiate analgesic: Secondary | ICD-10-CM | POA: Diagnosis not present

## 2020-02-07 DIAGNOSIS — G894 Chronic pain syndrome: Secondary | ICD-10-CM | POA: Diagnosis not present

## 2020-02-13 DIAGNOSIS — M961 Postlaminectomy syndrome, not elsewhere classified: Secondary | ICD-10-CM | POA: Diagnosis not present

## 2020-02-13 DIAGNOSIS — J449 Chronic obstructive pulmonary disease, unspecified: Secondary | ICD-10-CM | POA: Diagnosis not present

## 2020-02-13 DIAGNOSIS — M5440 Lumbago with sciatica, unspecified side: Secondary | ICD-10-CM | POA: Diagnosis not present

## 2020-02-13 DIAGNOSIS — G8929 Other chronic pain: Secondary | ICD-10-CM | POA: Diagnosis not present

## 2020-02-13 DIAGNOSIS — M545 Low back pain, unspecified: Secondary | ICD-10-CM | POA: Diagnosis not present

## 2020-02-13 DIAGNOSIS — G8928 Other chronic postprocedural pain: Secondary | ICD-10-CM | POA: Diagnosis not present

## 2020-02-13 DIAGNOSIS — I10 Essential (primary) hypertension: Secondary | ICD-10-CM | POA: Diagnosis not present

## 2020-02-13 DIAGNOSIS — G894 Chronic pain syndrome: Secondary | ICD-10-CM | POA: Diagnosis not present

## 2020-02-29 DIAGNOSIS — G894 Chronic pain syndrome: Secondary | ICD-10-CM | POA: Diagnosis not present

## 2020-02-29 DIAGNOSIS — Z79891 Long term (current) use of opiate analgesic: Secondary | ICD-10-CM | POA: Diagnosis not present

## 2020-03-12 DIAGNOSIS — G8928 Other chronic postprocedural pain: Secondary | ICD-10-CM | POA: Diagnosis not present

## 2020-03-12 DIAGNOSIS — M961 Postlaminectomy syndrome, not elsewhere classified: Secondary | ICD-10-CM | POA: Diagnosis not present

## 2020-03-12 DIAGNOSIS — I10 Essential (primary) hypertension: Secondary | ICD-10-CM | POA: Diagnosis not present

## 2020-03-12 DIAGNOSIS — M545 Low back pain, unspecified: Secondary | ICD-10-CM | POA: Diagnosis not present

## 2020-03-12 DIAGNOSIS — M5440 Lumbago with sciatica, unspecified side: Secondary | ICD-10-CM | POA: Diagnosis not present

## 2020-03-12 DIAGNOSIS — J449 Chronic obstructive pulmonary disease, unspecified: Secondary | ICD-10-CM | POA: Diagnosis not present

## 2020-03-12 DIAGNOSIS — G894 Chronic pain syndrome: Secondary | ICD-10-CM | POA: Diagnosis not present

## 2020-03-12 DIAGNOSIS — G8929 Other chronic pain: Secondary | ICD-10-CM | POA: Diagnosis not present

## 2020-03-13 DIAGNOSIS — G629 Polyneuropathy, unspecified: Secondary | ICD-10-CM | POA: Diagnosis not present

## 2020-03-13 DIAGNOSIS — R609 Edema, unspecified: Secondary | ICD-10-CM | POA: Diagnosis not present

## 2020-03-13 DIAGNOSIS — G894 Chronic pain syndrome: Secondary | ICD-10-CM | POA: Diagnosis not present

## 2020-03-13 DIAGNOSIS — E1165 Type 2 diabetes mellitus with hyperglycemia: Secondary | ICD-10-CM | POA: Diagnosis not present

## 2020-03-13 DIAGNOSIS — Z79891 Long term (current) use of opiate analgesic: Secondary | ICD-10-CM | POA: Diagnosis not present

## 2020-03-13 DIAGNOSIS — L03114 Cellulitis of left upper limb: Secondary | ICD-10-CM | POA: Diagnosis not present

## 2020-03-17 DIAGNOSIS — J449 Chronic obstructive pulmonary disease, unspecified: Secondary | ICD-10-CM | POA: Diagnosis not present

## 2020-03-20 ENCOUNTER — Encounter: Payer: Self-pay | Admitting: Student in an Organized Health Care Education/Training Program

## 2020-03-20 ENCOUNTER — Other Ambulatory Visit: Payer: Self-pay

## 2020-03-20 ENCOUNTER — Ambulatory Visit (HOSPITAL_BASED_OUTPATIENT_CLINIC_OR_DEPARTMENT_OTHER): Payer: Medicare HMO | Admitting: Student in an Organized Health Care Education/Training Program

## 2020-03-20 ENCOUNTER — Ambulatory Visit
Admission: RE | Admit: 2020-03-20 | Discharge: 2020-03-20 | Disposition: A | Discharge status: Home / Self Care | Payer: Medicare HMO | Source: Ambulatory Visit | Attending: Student in an Organized Health Care Education/Training Program | Admitting: Student in an Organized Health Care Education/Training Program

## 2020-03-20 VITALS — BP 177/96 | HR 90 | Temp 96.8°F | Resp 16 | Ht 70.0 in | Wt 199.0 lb

## 2020-03-20 DIAGNOSIS — M5136 Other intervertebral disc degeneration, lumbar region: Secondary | ICD-10-CM

## 2020-03-20 DIAGNOSIS — G8929 Other chronic pain: Secondary | ICD-10-CM

## 2020-03-20 DIAGNOSIS — M546 Pain in thoracic spine: Secondary | ICD-10-CM | POA: Insufficient documentation

## 2020-03-20 DIAGNOSIS — Z4889 Encounter for other specified surgical aftercare: Secondary | ICD-10-CM

## 2020-03-20 DIAGNOSIS — M4807 Spinal stenosis, lumbosacral region: Secondary | ICD-10-CM | POA: Diagnosis not present

## 2020-03-20 DIAGNOSIS — M5416 Radiculopathy, lumbar region: Secondary | ICD-10-CM

## 2020-03-20 DIAGNOSIS — G894 Chronic pain syndrome: Secondary | ICD-10-CM | POA: Diagnosis not present

## 2020-03-20 DIAGNOSIS — I2510 Atherosclerotic heart disease of native coronary artery without angina pectoris: Secondary | ICD-10-CM | POA: Insufficient documentation

## 2020-03-20 DIAGNOSIS — M961 Postlaminectomy syndrome, not elsewhere classified: Secondary | ICD-10-CM

## 2020-03-20 NOTE — Progress Notes (Signed)
Patient: Jose Carroll  Service Category: E/M  Provider: Gillis Santa, MD  DOB: 09/10/1965  DOS: 03/20/2020  Referring Provider: Jodi Marble, MD  MRN: [id number]  Setting: Ambulatory outpatient  PCP: Danelle Berry, NP  Type: New Patient  Specialty: Interventional Pain Management    Location: Office  Delivery: Face-to-face     Primary Reason(s) for Visit: Encounter for initial evaluation of one or more chronic problems (new to examiner) potentially causing chronic pain, and posing a threat to normal musculoskeletal function. (Level of risk: High) CC: Back Pain (Lumbar bilateral, more on the right ) and Leg Pain (Bilateral )  HPI  Jose Carroll is a 55 y.o. year old, male patient, who comes for the first time to our practice referred by Jodi Marble, MD for our initial evaluation of his chronic pain. He has S/P lumbar spinal fusion; DDD (degenerative disc disease), lumbar; Status post lumbar surgery; Lumbar radiculopathy; Spinal stenosis of lumbar region; Facet syndrome, lumbar; Sacroiliac joint dysfunction; Chest pain; Unstable angina (HCC); and CAD (coronary artery disease) on their problem list. Today he comes in for evaluation of his Back Pain (Lumbar bilateral, more on the right ) and Leg Pain (Bilateral )  Pain Assessment: Location: Lower,Left,Right Back Radiating: into both legs Onset: More than a month ago Duration: Chronic pain Quality: Discomfort,Constant (pinching) Severity: 6 /10 (subjective, self-reported pain score)  Effect on ADL: right leg weakness Timing: Constant Modifying factors: taking xtampza for pain, helps a little bit, takes the edge off. BP: (!) 177/96  HR: 90  Onset and Duration: Gradual Cause of pain: Work related accident or event Severity: No change since onset, NAS-11 at its worse: 10/10, NAS-11 at its best: 6/10, NAS-11 now: 6/10 and NAS-11 on the average: 5/10 Timing: Not influenced by the time of the day, During activity or exercise and  After a period of immobility Aggravating Factors: Bending, Climbing, Kneeling, Lifiting, Prolonged sitting, Prolonged standing, Squatting, Stooping , Twisting, Walking, Walking uphill, Walking downhill and Working Alleviating Factors: Lying down, Medications, TENS and Using a brace Associated Problems: Erectile dysfunction, Fatigue, Numbness, Spasms, Sweating, Swelling, Tingling, Pain that wakes patient up and Pain that does not allow patient to sleep Quality of Pain: Annoying, Constant, Cramping, Deep, Disabling, Dull, Getting longer, Hot, Nagging, Pulsating, Sharp, Stabbing, Tender, Throbbing and Uncomfortable Previous Examinations or Tests: MRI scan, Nerve block, X-rays and Orthopedic evaluation Previous Treatments: Narcotic medications and TENS   Jose Carroll is a very pleasant 55 year old male who presents with a chief complaint of low back pain which radiates into bilateral lower extremities, right greater than left in a dermatomal fashion.  Patient has had 4 lumbar spinal fusion surgeries.  He has persistent and chronic lumbar radicular pain in the context of lumbar postlaminectomy pain syndrome, failed back surgical syndrome.  Patient has done physical therapy in the past with limited benefit.  He tries to stay active and perform basic stretches that he has learned from PT at home.  He sees Dr. Humphrey Rolls for pain management and is on a multimodal pain regimen.  Patient also has a history of cardio vascular, coronary artery disease and is anticoagulated with Brilinta.  He has discussed potential spinal cord stimulation with his cardiologist and has received clearance to be off this medication for at most 2 weeks.  He has tried lumbar epidural injections in the past with limited benefit.  He would like to avoid any additional steroid therapy in the spine as he states it is not beneficial.  Meds   Current Outpatient Medications:  .  albuterol (PROVENTIL HFA;VENTOLIN HFA) 108 (90 BASE) MCG/ACT inhaler,  Inhale 2 puffs into the lungs every 6 (six) hours as needed for wheezing or shortness of breath. , Disp: , Rfl:  .  amoxicillin (AMOXIL) 875 MG tablet, Take 875 mg by mouth 2 (two) times daily., Disp: , Rfl:  .  aspirin EC 81 MG tablet, Take 81 mg by mouth daily., Disp: , Rfl:  .  atorvastatin (LIPITOR) 80 MG tablet, Take 80 mg by mouth daily., Disp: , Rfl:  .  cetirizine (ZYRTEC) 10 MG tablet, Take 10 mg by mouth daily., Disp: , Rfl:  .  empagliflozin (JARDIANCE) 25 MG TABS tablet, Take 25 mg by mouth daily., Disp: , Rfl:  .  gabapentin (NEURONTIN) 600 MG tablet, Take 1,200 mg by mouth 3 (three) times daily. , Disp: , Rfl:  .  glipiZIDE (GLUCOTROL) 5 MG tablet, Take 5 mg by mouth 2 (two) times daily before a meal., Disp: , Rfl:  .  icosapent Ethyl (VASCEPA) 1 g capsule, Take 2 g by mouth 2 (two) times daily., Disp: , Rfl:  .  insulin degludec (TRESIBA) 100 UNIT/ML FlexTouch Pen, Inject 26 Units into the skin at bedtime. , Disp: , Rfl:  .  ipratropium-albuterol (DUONEB) 0.5-2.5 (3) MG/3ML SOLN, Take 3 mLs by nebulization every 6 (six) hours as needed. , Disp: , Rfl:  .  isosorbide mononitrate (IMDUR) 60 MG 24 hr tablet, Take 120 mg by mouth at bedtime. , Disp: , Rfl:  .  nitroGLYCERIN (NITROSTAT) 0.4 MG SL tablet, Place 0.4 mg under the tongue every 5 (five) minutes as needed for chest pain., Disp: , Rfl:  .  oxyCODONE ER 9 MG C12A, Take 9 mg by mouth every 12 (twelve) hours. , Disp: , Rfl:  .  pantoprazole (PROTONIX) 40 MG tablet, Take 40 mg by mouth daily., Disp: , Rfl:  .  ranolazine (RANEXA) 1000 MG SR tablet, Take 1,000 mg by mouth 2 (two) times daily., Disp: , Rfl:  .  tamsulosin (FLOMAX) 0.4 MG CAPS capsule, Take 0.4 mg by mouth daily., Disp: , Rfl:  .  ticagrelor (BRILINTA) 90 MG TABS tablet, Take 1 tablet (90 mg total) by mouth 2 (two) times daily. (Patient taking differently: Take 60 mg by mouth 2 (two) times daily.), Disp: 60 tablet, Rfl: 2 .  tiZANidine (ZANAFLEX) 4 MG tablet, Take 4 mg  by mouth every 8 (eight) hours as needed for muscle spasms., Disp: , Rfl:  .  TRULICITY 8.58 IF/0.2DX SOPN, Inject 0.75 mg into the skin once a week., Disp: , Rfl:  .  umeclidinium bromide (INCRUSE ELLIPTA) 62.5 MCG/INH AEPB, Inhale 1 puff into the lungs in the morning and at bedtime. , Disp: , Rfl:   Imaging Review   Narrative CLINICAL DATA:  Neck pain and back pain. Hip prior lumbar fusion from L4 through S1.  TECHNIQUE: Contiguous axial images were obtained through the Cervical and Lumbar spine without infusion. Coronal and sagittal reconstructions were obtained of the axial image sets through the Cervical spine. Coronal, sagittal, and disc space reconstructions were obtained of the axial image sets through the Lumbar spine.  FLUOROSCOPY TIME:  1 min 4 seconds  PROCEDURE: LUMBAR PUNCTURE FOR CERVICAL AND LUMBAR MYELOGRAM  CERVICAL AND LUMBAR MYELOGRAM  CT CERVICAL MYELOGRAM  CT LUMBAR MYELOGRAM  After thorough discussion of risks and benefits of the procedure including bleeding, infection, injury to nerves, blood vessels, adjacent structures as well as headache  and CSF leak, written and oral informed consent was obtained. Consent was obtained by Dr. Dereck Ligas.  Patient was positioned prone on the fluoroscopy table. Local anesthesia was provided with 1% lidocaine without epinephrine after prepped and draped in the usual sterile fashion. Puncture was performed at L4-L5 using a 3 1/2 inch 22-gauge spinal needle via midline approach. Using a single pass through the dura, the needle was placed within the thecal sac, with return of clear CSF. 8 mL of Omnipaque-300 was injected into the thecal sac, with normal opacification of the nerve roots and cauda equina consistent with free flow within the subarachnoid space. The patient was then moved to the trendelenburg position and contrast flowed into the Cervical spine region.  I personally performed the lumbar puncture and  administered the intrathecal contrast. I also personally supervised acquisition of the myelogram images.  FINDINGS: CERVICAL AND LUMBAR MYELOGRAM FINDINGS:  Standing upright lumbar spine radiographs demonstrate L4 laminectomy. Discectomy at L4-L5 and L5-S1 with interbody bone grafts. There is no hardware complication identified. The L4 through S1 fusion appears solid. Thecal sac is been decompressed from L4 through S1. Mild adjacent segment disease is present with shallow disc bulging at L3-L4. There is no pathologic motion with flexion and extension maneuvers. The intervertebral discs from L1-L2 through L3-L4 appear within normal limits in height. No lateral recess stenosis is identified.  Cervical myelogram appears within normal limits.  CT CERVICAL MYELOGRAM FINDINGS:  Mildly exaggerated cervical lordosis is present. There is an abnormal articulation at the craniocervical junction. The inferior clivus is bulbous and articulates with the anterior arch of C1 which is more superiorly than typically position. Mild thickening of the transverse ligament of the atlas posteriorly, without compression of the cervicomedullary junction. This appears to be a chronic arthrosis at the clivus and atlas. Mild degenerative disease of C1-C2 is present. The facet joints appear normal. Prevertebral soft tissues are normal. Cervical cord shows no swelling or intra medullary lesions. Mild osseous ridging is present along the posterior longitudinal ligament suggesting early 0PLL. Additionally, short pedicles are present with superimposed degenerative disease.  C2-C3: Negative.  C3-C4: Mild central stenosis associated with shallow disc osteophyte complex. AP diameter of the central canal is 1 cm. There is bilateral foraminal encroachment associated with short pedicles and bilateral uncovertebral spurring that potentially affects the C4 nerves.  C4-C5: Minimal central stenosis. Mild disk  degeneration and posterior annular calcification. Mild right foraminal encroachment secondary to uncovertebral spurring.  C5-C6: Central canal appears patent. Neural foramina appear patent. Congenitally short pedicles noted.  C6-C7: Shallow disc osteophyte complex with mild central stenosis. Disk osteophyte complex appears suggest contact the ventral aspect of the cord with possible mild flattening. The foramina appear patent. Facet joints appear normal.  C7-T1: Disk desiccation and degeneration. The central canal appears adequately patent. Right-greater-than-left foraminal stenosis associated with facet and endplate spurring extending into the neural foramina.  T1-T2: Anterior annular calcification. No stenosis.  CT LUMBAR MYELOGRAM FINDINGS:  Lumbar spinal alignment is anatomic. The spinal cord terminates posterior to the T12 vertebra. Scattered Schmorl's nodes are present. Vertebral body height is preserved. Short pedicles are present centered around L3. There is mild superimposed degenerative disc disease. The paraspinal soft tissues show mild abdominal aortic atherosclerosis. Bilateral SI joint degenerative disease is present. There is substantial artifact associated with posterior rod and pedicle screw fixation from L4 through S1. Sacralization of the right L5 transverse process which is completely fused to the sacrum.  T11-T12: Mild central stenosis associated with  shallow broad-based disk bulge. The foramina appear patent.  T12-L1: Negative.  L1-L2: Mild congenital central stenosis. The foramina appear patent.  L2-L3: Mild disk degeneration with shallow circumferential bulge. Moderate multifactorial central stenosis is present. Bilateral lateral recess encroachment is present. There is mild congenital bilateral foraminal stenosis. There is an old fracture nonunion of the inferior right L2 articular process (image 26 series 400, image 33 series 401). Mild right facet  degeneration.  L3-L4: Severe spinal stenosis is both congenital and acquired. Posterior ligamentum flavum redundancy, facet hypertrophy and arthrosis and shallow disc bulging conspire to produce narrowing of the central canal. There is encroachment on both descending L4 nerves as they approach the lateral recesses. Mild symmetric foraminal stenosis is present.  L4-L5: Solid posterior lateral fusion with posterior decompression. Ossification of the right L4 neural foramen is present, of doubtful significance.  L5-S1: Solid posterior lateral fusion. Wide posterior decompression. Hypertrophic bone projects into the left L5-S1 foramen contacting the exiting left L5 nerve, the clinical significance of which is questionable.  IMPRESSION: 1. Technically successful lumbar puncture for cervical and lumbar myelogram. 2. Abnormal articulation of the skull base and C1-C2 complex. 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 Mild degenerative changes in the anterior arch of C1 and mild thickening of the transverse ligament of the atlas without compression of the proximal cervical cord. 3. Congenitally narrow cervical spinal canal with short pedicles. Superimposed degenerative disease is most pronounced at C3-C4 with mild central stenosis and bilateral foraminal stenosis potentially affecting both C4 nerves. Mild to moderate bilateral foraminal stenosis at C7-T1 potentially affects the C8 nerves. 4. Congenitally narrow lumbar spinal canal with short pedicles. Superimposed degenerative disease detailed above. 5. L2-L3 moderate central stenosis is multifactorial. Nonunion of an inferior right L2 articular process fracture. 6. L3-L4 severe multifactorial central and lateral recess stenosis. Congenitally narrow canal with superimposed degenerative disc and facet disease produces the  stenosis. 7. Solid fusion from L4 through S1.   Electronically Signed By: Dereck Ligas M.D. On: 11/14/2012 11:53 Lumbar MR wo contrast: Results for orders placed during the hospital encounter of 05/07/15  MR Lumbar Spine Wo Contrast  Narrative CLINICAL DATA:  History of prior lumbar surgery x4, last 02/14/2013. Low back pain radiating into the right calf. Right foot numbness. Symptoms since 2005.  EXAM: MRI LUMBAR SPINE WITHOUT CONTRAST  TECHNIQUE: Multiplanar, multisequence MR imaging of the lumbar spine was performed. No intravenous contrast was administered.  COMPARISON:  Plain films lumbar spine 07/03/2013. Postmyelogram CT scan 11/14/2012.  FINDINGS: The patient is status post L3-S1 laminectomy and fusion. Postoperative change at L3-4 is new since the prior CT. Vertebral body height and alignment are maintained. Marrow signal is unremarkable. The conus medullaris is normal in signal and position. Imaged intra-abdominal contents demonstrate a T2 hyperintense lesion in the left kidney compatible with a cyst.  T11-12: Shallow disc bulge without central canal or foraminal narrowing.  T12-L1:  Negative.  L1-2: Mild ligamentum flavum thickening, facet arthropathy and a minimal disc bulge. The central canal and foramina are open.  L2-3: Moderate facet arthropathy. Ligamentum flavum thickening and a minimal disc bulge. The central canal and foramina are open.  L3-4: Status post laminectomy and fusion. The central canal and foramina are widely patent.  L4-5: Status post laminectomy and fusion. Central canal and foramina are widely patent. There is a fluid collection posterior to the laminectomy bed measuring  3.2 cm transverse by 1.2 cm craniocaudal by 6.0 cm craniocaudal consistent with a seroma.  L5-S1: Status post laminectomy and fusion. The central canal and foramina are widely patent.  IMPRESSION: Status post L3-S1 laminectomy and fusion. The central canal  and foramina are widely patent at the fusion levels.  Mild to moderate facet degenerative change L1-2 and L2-3 where there is also some ligamentum flavum thickening. The central canal and foramina appear open at each level.   Electronically Signed By: Inge Rise M.D. On: 05/07/2015 10:35  Narrative CLINICAL DATA:  Neck pain and back pain. Hip prior lumbar fusion from L4 through S1.  TECHNIQUE: Contiguous axial images were obtained through the Cervical and Lumbar spine without infusion. Coronal and sagittal reconstructions were obtained of the axial image sets through the Cervical spine. Coronal, sagittal, and disc space reconstructions were obtained of the axial image sets through the Lumbar spine.  FLUOROSCOPY TIME:  1 min 4 seconds  PROCEDURE: LUMBAR PUNCTURE FOR CERVICAL AND LUMBAR MYELOGRAM  CERVICAL AND LUMBAR MYELOGRAM  CT CERVICAL MYELOGRAM  CT LUMBAR MYELOGRAM  After thorough discussion of risks and benefits of the procedure including bleeding, infection, injury to nerves, blood vessels, adjacent structures as well as headache and CSF leak, written and oral informed consent was obtained. Consent was obtained by Dr. Dereck Ligas.  Patient was positioned prone on the fluoroscopy table. Local anesthesia was provided with 1% lidocaine without epinephrine after prepped and draped in the usual sterile fashion. Puncture was performed at L4-L5 using a 3 1/2 inch 22-gauge spinal needle via midline approach. Using a single pass through the dura, the needle was placed within the thecal sac, with return of clear CSF. 8 mL of Omnipaque-300 was injected into the thecal sac, with normal opacification of the nerve roots and cauda equina consistent with free flow within the subarachnoid space. The patient was then moved to the trendelenburg position and contrast flowed into the Cervical spine region.  I personally performed the lumbar puncture and administered  the intrathecal contrast. I also personally supervised acquisition of the myelogram images.  FINDINGS: CERVICAL AND LUMBAR MYELOGRAM FINDINGS:  Standing upright lumbar spine radiographs demonstrate L4 laminectomy. Discectomy at L4-L5 and L5-S1 with interbody bone grafts. There is no hardware complication identified. The L4 through S1 fusion appears solid. Thecal sac is been decompressed from L4 through S1. Mild adjacent segment disease is present with shallow disc bulging at L3-L4. There is no pathologic motion with flexion and extension maneuvers. The intervertebral discs from L1-L2 through L3-L4 appear within normal limits in height. No lateral recess stenosis is identified.  Cervical myelogram appears within normal limits.  CT CERVICAL MYELOGRAM FINDINGS:  Mildly exaggerated cervical lordosis is present. There is an abnormal articulation at the craniocervical junction. The inferior clivus is bulbous and articulates with the anterior arch of C1 which is more superiorly than typically position. Mild thickening of the transverse ligament of the atlas posteriorly, without compression of the cervicomedullary junction. This appears to be a chronic arthrosis at the clivus and atlas. Mild degenerative disease of C1-C2 is present. The facet joints appear normal. Prevertebral soft tissues are normal. Cervical cord shows no swelling or intra medullary lesions. Mild osseous ridging is present along the posterior longitudinal ligament suggesting early 0PLL. Additionally, short pedicles are present with superimposed degenerative disease.  C2-C3: Negative.  C3-C4: Mild central stenosis associated with shallow disc osteophyte complex. AP diameter of the central canal is 1 cm. There is bilateral foraminal encroachment associated with  short pedicles and bilateral uncovertebral spurring that potentially affects the C4 nerves.  C4-C5: Minimal central stenosis. Mild disk degeneration  and posterior annular calcification. Mild right foraminal encroachment secondary to uncovertebral spurring.  C5-C6: Central canal appears patent. Neural foramina appear patent. Congenitally short pedicles noted.  C6-C7: Shallow disc osteophyte complex with mild central stenosis. Disk osteophyte complex appears suggest contact the ventral aspect of the cord with possible mild flattening. The foramina appear patent. Facet joints appear normal.  C7-T1: Disk desiccation and degeneration. The central canal appears adequately patent. Right-greater-than-left foraminal stenosis associated with facet and endplate spurring extending into the neural foramina.  T1-T2: Anterior annular calcification. No stenosis.  CT LUMBAR MYELOGRAM FINDINGS:  Lumbar spinal alignment is anatomic. The spinal cord terminates posterior to the T12 vertebra. Scattered Schmorl's nodes are present. Vertebral body height is preserved. Short pedicles are present centered around L3. There is mild superimposed degenerative disc disease. The paraspinal soft tissues show mild abdominal aortic atherosclerosis. Bilateral SI joint degenerative disease is present. There is substantial artifact associated with posterior rod and pedicle screw fixation from L4 through S1. Sacralization of the right L5 transverse process which is completely fused to the sacrum.  T11-T12: Mild central stenosis associated with shallow broad-based disk bulge. The foramina appear patent.  T12-L1: Negative.  L1-L2: Mild congenital central stenosis. The foramina appear patent.  L2-L3: Mild disk degeneration with shallow circumferential bulge. Moderate multifactorial central stenosis is present. Bilateral lateral recess encroachment is present. There is mild congenital bilateral foraminal stenosis. There is an old fracture nonunion of the inferior right L2 articular process (image 26 series 400, image 33 series 401). Mild right facet  degeneration.  L3-L4: Severe spinal stenosis is both congenital and acquired. Posterior ligamentum flavum redundancy, facet hypertrophy and arthrosis and shallow disc bulging conspire to produce narrowing of the central canal. There is encroachment on both descending L4 nerves as they approach the lateral recesses. Mild symmetric foraminal stenosis is present.  L4-L5: Solid posterior lateral fusion with posterior decompression. Ossification of the right L4 neural foramen is present, of doubtful significance.  L5-S1: Solid posterior lateral fusion. Wide posterior decompression. Hypertrophic bone projects into the left L5-S1 foramen contacting the exiting left L5 nerve, the clinical significance of which is questionable.  IMPRESSION: 1. Technically successful lumbar puncture for cervical and lumbar myelogram. 2. Abnormal articulation of the skull base and C1-C2 complex. 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 Mild degenerative changes in the anterior arch of C1 and mild thickening of the transverse ligament of the atlas without compression of the proximal cervical cord. 3. Congenitally narrow cervical spinal canal with short pedicles. Superimposed degenerative disease is most pronounced at C3-C4 with mild central stenosis and bilateral foraminal stenosis potentially affecting both C4 nerves. Mild to moderate bilateral foraminal stenosis at C7-T1 potentially affects the C8 nerves. 4. Congenitally narrow lumbar spinal canal with short pedicles. Superimposed degenerative disease detailed above. 5. L2-L3 moderate central stenosis is multifactorial. Nonunion of an inferior right L2 articular process fracture. 6. L3-L4 severe multifactorial central and lateral recess stenosis. Congenitally narrow canal with superimposed degenerative disc and facet disease produces the  stenosis. 7. Solid fusion from L4 through S1.   Electronically Signed By: Dereck Ligas M.D. On: 11/14/2012 11:53   Narrative CLINICAL DATA:  Posterior lumbar fusion  EXAM: DG C-ARM 1-60 MIN; LUMBAR SPINE - 2-3 VIEW  COMPARISON:  None  FLUOROSCOPY TIME:  24 second  FINDINGS: Two intraoperative fluoroscopic spot images are provided from PLIF from L3 through S1 with bilateral pedicle screws at each level. No hardware failure or complication is demonstrated.  IMPRESSION: PLIF from L3 through S1 without hardware failure or complication.   Electronically Signed By: Kathreen Devoid On: 02/14/2013 14:58   I was also able to evaluate the patient's interlaminar windows under laparoscopy although the patient does have moderate facet arthrosis, I still think we can attempt lead placement for trial.  We will follow up with thoracic MRI to rule out thoracic canal stenosis.  Complexity Note: Imaging results reviewed. Results shared with Jose Carroll, using Layman's terms.                         ROS  Cardiovascular: Heart trouble, Daily Aspirin intake, High blood pressure and Blood thinners:  Antiplatelet Pulmonary or Respiratory: Lung problems, Smoking and Snoring  Neurological: No reported neurological signs or symptoms such as seizures, abnormal skin sensations, urinary and/or fecal incontinence, being born with an abnormal open spine and/or a tethered spinal cord Psychological-Psychiatric: No reported psychological or psychiatric signs or symptoms such as difficulty sleeping, anxiety, depression, delusions or hallucinations (schizophrenial), mood swings (bipolar disorders) or suicidal ideations or attempts Gastrointestinal: Heartburn due to stomach pushing into lungs (Hiatal hernia) and Reflux or heatburn Genitourinary: No reported renal or genitourinary signs or symptoms such as difficulty voiding or producing urine, peeing blood, non-functioning kidney, kidney stones, difficulty  emptying the bladder, difficulty controlling the flow of urine, or chronic kidney disease Hematological: Bleeding easily Endocrine: High blood sugar requiring insulin (IDDM) Rheumatologic: No reported rheumatological signs and symptoms such as fatigue, joint pain, tenderness, swelling, redness, heat, stiffness, decreased range of motion, with or without associated rash Musculoskeletal: Negative for myasthenia gravis, muscular dystrophy, multiple sclerosis or malignant hyperthermia Work History: Disabled  Allergies  Jose Carroll is allergic to naproxen and penicillins.  Laboratory Chemistry Profile   Renal Lab Results  Component Value Date   BUN 19 10/29/2017   CREATININE 1.31 (H) 10/29/2017   GFRAA >60 10/29/2017   GFRNONAA >60 10/29/2017     Electrolytes Lab Results  Component Value Date   NA 140 10/29/2017   K 4.3 10/29/2017   CL 110 10/29/2017   CALCIUM 9.1 10/29/2017     Hepatic Lab Results  Component Value Date   AST 27 04/29/2015   ALT 22 04/29/2015   ALBUMIN 4.1 04/29/2015   ALKPHOS 45 04/29/2015     ID Lab Results  Component Value Date   HIV NON REACTIVE 03/12/2019   STAPHAUREUS NEGATIVE 02/07/2013   MRSAPCR NEGATIVE 04/30/2015   HCVAB NON REACTIVE 03/12/2019     Bone No results found for: VD25OH, VD125OH2TOT, WL7989QJ1, HE1740CX4, 25OHVITD1, 25OHVITD2, 25OHVITD3, TESTOFREE, TESTOSTERONE   Endocrine Lab Results  Component Value Date   GLUCOSE 103 (H) 10/29/2017   HGBA1C 7.8 (H) 05/05/2011     Neuropathy Lab Results  Component Value Date   VITAMINB12 575 03/12/2019   FOLATE 32.0 03/12/2019   HGBA1C 7.8 (H) 05/05/2011   HIV NON REACTIVE 03/12/2019     CNS No results found for: COLORCSF, APPEARCSF, RBCCOUNTCSF, WBCCSF, POLYSCSF, LYMPHSCSF, EOSCSF, PROTEINCSF, GLUCCSF, JCVIRUS, CSFOLI, IGGCSF, LABACHR, ACETBL, LABACHR, ACETBL   Inflammation (CRP: Acute  ESR: Chronic) No results found for: CRP, ESRSEDRATE, LATICACIDVEN   Rheumatology No results  found for: RF, ANA, LABURIC, URICUR, LYMEIGGIGMAB, LYMEABIGMQN, HLAB27   Coagulation Lab Results  Component Value  Date   INR 0.89 02/07/2013   LABPROT 11.9 02/07/2013   APTT 38.4 (H) 05/05/2011   PLT 139 (L) 03/12/2019     Cardiovascular Lab Results  Component Value Date   CKTOTAL 131 05/05/2011   CKMB 1.3 05/05/2011   TROPONINI <0.03 04/29/2015   HGB 16.4 03/12/2019   HCT 50.8 03/12/2019     Screening Lab Results  Component Value Date   STAPHAUREUS NEGATIVE 02/07/2013   MRSAPCR NEGATIVE 04/30/2015   HCVAB NON REACTIVE 03/12/2019   HIV NON REACTIVE 03/12/2019     Cancer No results found for: CEA, CA125, LABCA2   Allergens No results found for: ALMOND, APPLE, ASPARAGUS, AVOCADO, BANANA, BARLEY, BASIL, BAYLEAF, GREENBEAN, LIMABEAN, WHITEBEAN, BEEFIGE, REDBEET, BLUEBERRY, BROCCOLI, CABBAGE, MELON, CARROT, CASEIN, CASHEWNUT, CAULIFLOWER, CELERY     Note: Lab results reviewed.  [place]  Drug: Jose Carroll  reports no history of drug use. Alcohol:  reports no history of alcohol use. Tobacco:  reports that he quit smoking about 4 years ago. His smoking use included cigarettes. He has a 25.00 pack-year smoking history. He quit smokeless tobacco use about 4 years ago. Medical:  has a past medical history of Anginal pain ([place]), Arthritis, COPD (chronic obstructive pulmonary disease) ([place]), Coronary artery disease, Diabetes mellitus without complication ([place]), GERD (gastroesophageal reflux disease), Hypertension, and Shortness of breath. Family: family history includes Anuerysm in his father; Cancer in his mother; Diabetes in his mother.  Past Surgical History:  Procedure Laterality Date  . BACK SURGERY     x3  . CARDIAC CATHETERIZATION N/A 09/10/2014   Procedure: Left Heart Cath and Coronary Angiogram ;  Surgeon: Dionisio David, MD;  Location: [facility];  Service: Cardiovascular;  Laterality: N/A;  . CARDIAC CATHETERIZATION Left 06/26/2015   Procedure: Left Heart Cath  and Coronary Angiography;  Surgeon: Dionisio David, MD;  Location: [facility];  Service: Cardiovascular;  Laterality: Left;  . CORONARY STENT INTERVENTION N/A 10/28/2017   Procedure: CORONARY STENT INTERVENTION;  Surgeon: Wellington Hampshire, MD;  Location: [facility];  Service: Cardiovascular;  Laterality: N/A;  . FETAL SURGERY FOR CONGENITAL HERNIA  2012  . FINGER SURGERY     sixth finger removed  . HERNIA REPAIR    . LEFT HEART CATH AND CORONARY ANGIOGRAPHY Left 10/28/2017   Procedure: LEFT HEART CATH AND CORONARY ANGIOGRAPHY;  Surgeon: Dionisio David, MD;  Location: [facility];  Service: Cardiovascular;  Laterality: Left;  . SUBCLAVIAN ARTERY STENT     Active Ambulatory Problems    Diagnosis Date Noted  . S/P lumbar spinal fusion 02/14/2013  . DDD (degenerative disc disease), lumbar 03/06/2015  . Status post lumbar surgery 03/06/2015  . Lumbar radiculopathy 03/06/2015  . Spinal stenosis of lumbar region 03/06/2015  . Facet syndrome, lumbar 03/06/2015  . Sacroiliac joint dysfunction 03/06/2015  . Chest pain 04/29/2015  . Unstable angina ([place]) 10/28/2017  . CAD (coronary artery disease) 10/28/2017   Resolved Ambulatory Problems    Diagnosis Date Noted  . No Resolved Ambulatory Problems   Past Medical History:  Diagnosis Date  . Anginal pain ([place])   . Arthritis   . COPD (chronic obstructive pulmonary disease) ([place])   . Coronary artery disease   . Diabetes mellitus without complication ([place])   . GERD (gastroesophageal reflux disease)   . Hypertension   . Shortness of breath    Constitutional Exam  General appearance: Well nourished, well developed, and well hydrated. In no apparent acute  distress Vitals:   03/20/20 0926  BP: (!) 177/96  Pulse: 90  Resp: 16  Temp: (!) 96.8 F (36 C)  TempSrc: Temporal  SpO2: 99%  Weight: 199 lb (90.3 kg)  Height: 5' 10" (1.778 m)   BMI Assessment: Estimated body mass index is 28.55 kg/m as calculated  from the following:   Height as of this encounter: 5' 10" (1.778 m).   Weight as of this encounter: 199 lb (90.3 kg).  BMI interpretation table: BMI level Category Range association with higher incidence of chronic pain  <18 kg/m2 Underweight   18.5-24.9 kg/m2 Ideal body weight   25-29.9 kg/m2 Overweight Increased incidence by 20%  30-34.9 kg/m2 Obese (Class I) Increased incidence by 68%  35-39.9 kg/m2 Severe obesity (Class II) Increased incidence by 136%  >40 kg/m2 Extreme obesity (Class III) Increased incidence by 254%   Patient's current BMI Ideal Body weight  Body mass index is 28.55 kg/m. Ideal body weight: 73 kg (160 lb 15 oz) Adjusted ideal body weight: 79.9 kg (176 lb 2.6 oz)   BMI Readings from Last 4 Encounters:  03/20/20 28.55 kg/m  03/12/19 28.55 kg/m  10/29/17 28.97 kg/m  09/03/15 30.85 kg/m   Wt Readings from Last 4 Encounters:  03/20/20 199 lb (90.3 kg)  03/12/19 199 lb (90.3 kg)  10/29/17 201 lb 14.4 oz (91.6 kg)  09/03/15 215 lb (97.5 kg)    Psych/Mental status: Alert, oriented x 3 (person, place, & time)       Eyes: PERLA Respiratory: No evidence of acute respiratory distress  Cervical Spine Exam  Skin & Axial Inspection: No masses, redness, edema, swelling, or associated skin lesions Alignment: Symmetrical Functional ROM: Unrestricted ROM      Stability: No instability detected Muscle Tone/Strength: Functionally intact. No obvious neuro-muscular anomalies detected. Sensory (Neurological): Unimpaired Palpation: No palpable anomalies              Upper Extremity (UE) Exam    Side: Right upper extremity  Side: Left upper extremity  Skin & Extremity Inspection: Skin color, temperature, and hair growth are WNL. No peripheral edema or cyanosis. No masses, redness, swelling, asymmetry, or associated skin lesions. No contractures.  Skin & Extremity Inspection: Skin color, temperature, and hair growth are WNL. No peripheral edema or cyanosis. No masses,  redness, swelling, asymmetry, or associated skin lesions. No contractures.  Functional ROM: Unrestricted ROM          Functional ROM: Unrestricted ROM          Muscle Tone/Strength: Functionally intact. No obvious neuro-muscular anomalies detected.   Muscle Tone/Strength: Functionally intact. No obvious neuro-muscular anomalies detected.  Sensory (Neurological): Unimpaired          Sensory (Neurological): Unimpaired          Palpation: No palpable anomalies              Palpation: No palpable anomalies              Provocative Test(s):  Phalen's test: deferred Tinel's test: deferred Apley's scratch test (touch opposite shoulder):  Action 1 (Across chest): deferred Action 2 (Overhead): deferred Action 3 (LB reach): deferred   Provocative Test(s):  Phalen's test: deferred Tinel's test: deferred Apley's scratch test (touch opposite shoulder):  Action 1 (Across chest): deferred Action 2 (Overhead): deferred Action 3 (LB reach): deferred    Thoracic Spine Area Exam  Skin & Axial Inspection: No masses, redness, or swelling Alignment: Symmetrical Functional ROM: Unrestricted ROM Stability: No instability detected Muscle  Tone/Strength: Functionally intact. No obvious neuro-muscular anomalies detected. Sensory (Neurological): Neurogenic pain pattern  Lumbar Exam  Skin & Axial Inspection: Well healed scar from previous spine surgery detected Alignment: Symmetrical Functional ROM: Pain restricted ROM affecting both sides Stability: No instability detected Muscle Tone/Strength: Functionally intact. No obvious neuro-muscular anomalies detected. Sensory (Neurological): Dermatomal pain pattern right greater than left Palpation: No palpable anomalies       Provocative Tests: Hyperextension/rotation test: Unable to perform due to fusion restriction. Lumbar quadrant test (Kemp's test): (+) bilateral for foraminal stenosis Lateral bending test: (+) ipsilateral radicular pain, bilaterally.  Positive for bilateral foraminal stenosis.   Gait & Posture Assessment  Ambulation: Limited Gait: Limited. Using assistive device to ambulate Posture: Difficulty standing up straight, due to pain   Lower Extremity Exam    Side: Right lower extremity  Side: Left lower extremity  Stability: No instability observed          Stability: No instability observed          Skin & Extremity Inspection: Skin color, temperature, and hair growth are WNL. No peripheral edema or cyanosis. No masses, redness, swelling, asymmetry, or associated skin lesions. No contractures.  Skin & Extremity Inspection: Skin color, temperature, and hair growth are WNL. No peripheral edema or cyanosis. No masses, redness, swelling, asymmetry, or associated skin lesions. No contractures.  Functional ROM: Pain restricted ROM for hip and knee joints          Functional ROM: Pain restricted ROM for hip and knee joints          Muscle Tone/Strength: Functionally intact. No obvious neuro-muscular anomalies detected.  Muscle Tone/Strength: Functionally intact. No obvious neuro-muscular anomalies detected.  Sensory (Neurological): Dermatomal pain pattern        Sensory (Neurological): Dermatomal pain pattern        DTR: Patellar: deferred today Achilles: deferred today Plantar: deferred today  DTR: Patellar: deferred today Achilles: deferred today Plantar: deferred today  Palpation: No palpable anomalies  Palpation: No palpable anomalies   Assessment  Primary Diagnosis & Pertinent Problem List: The primary encounter diagnosis was Failed back surgical syndrome. Diagnoses of Lumbar postlaminectomy syndrome, Chronic radicular lumbar pain (right greater than left), Chronic midline thoracic back pain, Coronary artery disease without angina pectoris, unspecified vessel or lesion type, unspecified whether native or transplanted heart, Chronic pain syndrome (sees Dr Humphrey Rolls for MM), Other intervertebral disc degeneration, lumbar region,  Encounter for other specified surgical aftercare, and Spinal stenosis of lumbosacral region were also pertinent to this visit.  Visit Diagnosis (New problems to examiner): 1. Failed back surgical syndrome   2. Lumbar postlaminectomy syndrome   3. Chronic radicular lumbar pain (right greater than left)   4. Chronic midline thoracic back pain   5. Coronary artery disease without angina pectoris, unspecified vessel or lesion type, unspecified whether native or transplanted heart   6. Chronic pain syndrome (sees Dr Humphrey Rolls for MM)   7. Other intervertebral disc degeneration, lumbar region   8. Encounter for other specified surgical aftercare   9. Spinal stenosis of lumbosacral region    Plan of Care (Initial workup plan)   Patient has a history of failed back surgical syndrome, lumbar postlaminectomy pain syndrome and chronic lumbar radicular pain in the context of having 4 prior lumbar spinal fusions.  He has done physical therapy with limited benefit.  He sees Dr. Humphrey Rolls for comprehensive pain management.  He is being referred here to consider spinal cord stimulation as he has failed spinal injections  as well.  I discussed  percutaneous spinal cord stimulator trial with the patient in detail. I explained to the patient that they will have an external power source and programmer which the patient will use for 7 days. There will be daily communication with the stimulator company and the patient. A possible need for a mid-trial clinic visit to give the patient the best chance of success.   Patient will need to have a thorough psychosocial behavioral evaluation. Our office will be happy to help facilitate this. Will place referral to Dr Lyman Speller.  Some of patient's pain does seem to be mechanical in nature, with some component of neurogenic pain as well. We discussed the indications for spinal cord stimulation, specifically stating that it is typically better for neuropathic and appendicular pain, but that  we have had some success in the treatment of low back and hip pain.   Patient also has not had any recent imaging of his thoracic or lumbar spine.  To rule out thoracic canal stenosis for spinal cord stimulator trial and implant planning recommend thoracic MRI.  Also recommend lumbar MRI for spinal cord stimulator trial implant planning  Patient is interested in proceeding with spinal cord stimulation trial. He understands that this may not be successful, and that spinal cord stimulation in general is not a "magic bullet."   We had a lengthy and very detailed discussion of all the risks, benefits, alternatives, and rationale of surgery as well as the option of continuing nonsurgical therapies. We specifically discussed the risks of temporary or permanent worsened neurologic injury, no symptomatic relief or pain made worse after procedure, and also the need for future surgery (due to infection, CSF leak, bleeding, adjacent segment issues, bone-healing difficulties, and other related issues). No guarantees of outcome were made or implied and he is eager to proceed and presents for definitive treatment.   Jose Carroll told me that all of his questions were answered thoroughly and to his satisfaction. Confidence and understanding of the discussed risks and consequences of  treatment was expressed and he accepted these risks and was eager to proceed with procedure.   Issues concerning treatment and diagnosis were discussed with the patient. There are no barriers to understanding the plan of treatment. Explanation was well received by patient and/or family who then verbalized understanding.   Patient will contact us after he has completed his psychological evaluation and MRI studies to review and finalize spinal cord stimulator trial date.   Of note, we will need cardiac clearance, documented from his cardiologist regarding holding his Brilinta for his trial.   Imaging Orders     DG PAIN CLINIC C-ARM  1-60 MIN NO REPORT     MR [place]  Referral Orders     Ambulatory referral to Psychology   Interventional management options: Jose Carroll was informed that there is no guarantee that he would be a candidate for interventional therapies. The decision will be based on the results of diagnostic studies, as well as Mr. Mazzoni risk profile.  Procedure(s) under consideration:  [employer] spinal cord stimulator trial for failed back surgical syndrome.   Provider-requested follow-up: Return for Patient will call for F2F after MRI and psych eval to finalize spinal cord stimulator trial.  Future Appointments  Date Time Provider [place]  03/24/2020  9:30 AM Jeri Cos Eday III, PA-C [facility] None  03/25/2020 11:45 AM [facility] None  03/28/2020  2:15 PM Sindy Guadel[place], MD [facility] None    Note by: Gillis Santa, MD Date: 03/20/2020; Time: 11:13 AM

## 2020-03-20 NOTE — Progress Notes (Signed)
Safety precautions to be maintained throughout the outpatient stay will include: orient to surroundings, keep bed in low position, maintain call bell within reach at all times, provide assistance with transfer out of bed and ambulation.  

## 2020-03-24 ENCOUNTER — Other Ambulatory Visit: Payer: Self-pay

## 2020-03-24 ENCOUNTER — Encounter: Payer: Medicare HMO | Attending: Physician Assistant | Admitting: Physician Assistant

## 2020-03-24 DIAGNOSIS — L98499 Non-pressure chronic ulcer of skin of other sites with unspecified severity: Secondary | ICD-10-CM | POA: Diagnosis not present

## 2020-03-24 DIAGNOSIS — Z7902 Long term (current) use of antithrombotics/antiplatelets: Secondary | ICD-10-CM | POA: Insufficient documentation

## 2020-03-24 DIAGNOSIS — Z7982 Long term (current) use of aspirin: Secondary | ICD-10-CM | POA: Insufficient documentation

## 2020-03-24 DIAGNOSIS — L98492 Non-pressure chronic ulcer of skin of other sites with fat layer exposed: Secondary | ICD-10-CM | POA: Insufficient documentation

## 2020-03-24 DIAGNOSIS — I10 Essential (primary) hypertension: Secondary | ICD-10-CM | POA: Diagnosis not present

## 2020-03-24 DIAGNOSIS — E11622 Type 2 diabetes mellitus with other skin ulcer: Secondary | ICD-10-CM | POA: Insufficient documentation

## 2020-03-24 DIAGNOSIS — R69 Illness, unspecified: Secondary | ICD-10-CM | POA: Diagnosis not present

## 2020-03-24 DIAGNOSIS — F17200 Nicotine dependence, unspecified, uncomplicated: Secondary | ICD-10-CM | POA: Diagnosis not present

## 2020-03-24 NOTE — Progress Notes (Addendum)
KEMAR, PANDIT ([id number]) Visit Report for 03/24/2020 Chief Complaint Document Details Patient Name: Jose Carroll, Jose Carroll. Date of Service: 03/24/2020 9:30 AM Medical Record Number: [id number] Patient Account Number: [id number] Date of Birth/Sex: Oct 28, 1965 (55 y.o. M) Treating RN: Carlene Coria Primary Care Provider: Margurite Auerbach Other Clinician: Jeanine Luz Referring Provider: Margurite Auerbach Treating Provider/Extender: Jeri Cos Weeks in Treatment: 0 Information Obtained from: Patient Chief Complaint Right elbow ulcer Electronic Signature(s) Signed: 03/24/2020 10:45:10 AM By: Worthy Keeler PA-C Previous Signature: 03/24/2020 9:43:06 AM Version By: Worthy Keeler PA-C Entered By: Worthy Keeler on 03/24/2020 10:45:10 Jose Carroll ([id number]) -------------------------------------------------------------------------------- Debridement Details Patient Name: Jose Carroll Date of Service: 03/24/2020 9:30 AM Medical Record Number: [id number] Patient Account Number: [id number] Date of Birth/Sex: 08-13-1965 (55 y.o. M) Treating RN: Carlene Coria Primary Care Provider: Margurite Auerbach Other Clinician: Jeanine Luz Referring Provider: Margurite Auerbach Treating Provider/Extender: Jeri Cos Weeks in Treatment: 0 Debridement Performed for Wound #3 Elbow Assessment: Performed By: Physician Tommie Sams., PA-C Debridement Type: Debridement Level of Consciousness (Pre- Awake and Alert procedure): Pre-procedure Verification/Time Out Yes - 10:35 Taken: Start Time: 10:35 Pain Control: Lidocaine 4% Topical Solution Total Area Debrided (L x W): 0.6 (cm) x 1.5 (cm) = 0.9 (cm) Tissue and other material Viable, Non-Viable, Slough, Subcutaneous, Skin: Dermis , Skin: Epidermis, Slough debrided: Level: Skin/Subcutaneous Tissue Debridement Description: Excisional Instrument: Curette Bleeding: Minimum Hemostasis Achieved: Pressure End Time: 10:39 Procedural  Pain: 0 Post Procedural Pain: 0 Response to Treatment: Procedure was tolerated well Level of Consciousness (Post- Awake and Alert procedure): Post Debridement Measurements of Total Wound Length: (cm) 0.6 Width: (cm) 1.5 Depth: (cm) 0.1 Volume: (cm) 0.071 Character of Wound/Ulcer Post Debridement: Improved Post Procedure Diagnosis Same as Pre-procedure Electronic Signature(s) Signed: 03/24/2020 4:25:37 PM By: Worthy Keeler PA-C Signed: 03/25/2020 4:27:39 PM By: Carlene Coria RN Entered By: Carlene Coria on 03/24/2020 10:38:45 Jose Carroll ([id number]) -------------------------------------------------------------------------------- HPI Details Patient Name: Jose Osgood A. Date of Service: 03/24/2020 9:30 AM Medical Record Number: [id number] Patient Account Number: [id number] Date of Birth/Sex: [birth year] (55 y.o. M) Treating RN: Carlene Coria Primary Care Provider: Margurite Auerbach Other Clinician: Jeanine Luz Referring Provider: Margurite Auerbach Treating Provider/Extender: Jeri Cos Weeks in Treatment: 0 History of Present Illness HPI Description: 04/27/16; The is a 55 yo type 2 diabetic who still smokes. He tells me that he has known PAD followed by Dr. Chancy Milroy at [facility]. He thinks he had arterial studies by Dr Chancy Milroy (osp) less than a year ago. He tells me he had less than a block claudication at one point but is now on a combination of asa, Plavix and Zontivity and now has a 1/2 mile tolerance. 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 Approx 1 month ago he noted 2 open wounds on his right leg and foot . No obvious trauma. He has not been doing anything specific to the wounds. "leaving open to air". He denies a hx of CAD although this appears on his problem list. He was given a course of clindamycin which he has completed His ABI in this clinic is 0.97. No major wound history although he has small healed  areas on the right leg 05/04/16; I have obtained this man's records from [place]. He is indeed followed there. It would appear that he had arterial studies done on 07/09/15 this showed a right ABI of 0.83 a left of 0.86. He had Doppler waveforms that appear to be  monophasic for the most part. He has had CTA on 2 occasions most recently on 07/25/15. This showed mild atherosclerotic calcification in the aorta and common iliac arteries without evidence of aneurysm. There was atherosclerotic irregularity and narrowing without occlusion or high-grade stenosis of the superficial and deep femoral arteries there was atherosclerotic irregularity and narrowing of the runoff vessels bilaterally which was progressive to the prior study in 2014 at which time he had good 3 vessel runoff. 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 The patient's wounds have improved since last time although there are small and touchdown. Located on the right calf and right foot 05/18/16; Wounds are totally epithelialized Readmission: 03/24/2020 upon evaluation today patient appears to have an injury to his left elbow. He tells me that he is not exactly sure what he did to cause this injury. Nonetheless he tells me that even so he does have a history of hypertension as well as diabetes. Fortunately there is no signs of active infection at this point which is great news. I think the biggest thing is he may benefit from using a silver collagen dressing. Electronic Signature(s) Signed: 03/24/2020 10:45:29 AM By: Worthy Keeler PA-C Entered By: Worthy Keeler on 03/24/2020 10:45:29 Jose Carroll ([id number]) -------------------------------------------------------------------------------- Physical Exam Details Patient Name: Jose Carroll Date of Service: 03/24/2020 9:30 AM Medical Record Number: [id number] Patient Account Number: [id number] Date of Birth/Sex: [birth year]  (56 y.o. M) Treating RN: Carlene Coria Primary Care Provider: Margurite Auerbach Other Clinician: Jeanine Luz Referring Provider: Boswell, Chelsea Treating Provider/Extender: Jeri Cos Weeks in Treatment: 0 Constitutional sitting or standing blood pressure is within target range for patient.. pulse regular and within target range for patient.Marland Kitchen respirations regular, non- labored and within target range for patient.Marland Kitchen temperature within target range for patient.. Well-nourished and well-hydrated in no acute distress. Eyes conjunctiva clear no eyelid edema noted. pupils equal round and reactive to light and accommodation. Ears, Nose, Mouth, and Throat no gross abnormality of ear auricles or external auditory canals. normal hearing noted during conversation. mucus membranes moist. Respiratory normal breathing without difficulty. Cardiovascular 2+ dorsalis pedis/posterior tibialis pulses. no clubbing, cyanosis, significant edema, <3 sec cap refill. Musculoskeletal normal gait and posture. no significant deformity or arthritic changes, no loss or range of motion, no clubbing. Psychiatric this patient is able to make decisions and demonstrates good insight into disease process. Alert and Oriented x 3. pleasant and cooperative. Notes Upon inspection patient's wound bed actually showed signs of fairly good granulation there was some slough and eschar buildup however did require sharp debridement to clear this away patient tolerated that today without complication. Post debridement the wound bed appears to be doing much better which is great news. No fevers, chills, nausea, vomiting, or diarrhea. Electronic Signature(s) Signed: 03/24/2020 10:46:00 AM By: Worthy Keeler PA-C Entered By: Worthy Keeler on 03/24/2020 10:45:59 Jose Carroll ([id number]) -------------------------------------------------------------------------------- Physician Orders Details Patient Name: Jose Carroll Date of Service: 03/24/2020 9:30 AM Medical Record Number: [id number] Patient Account Number: [id number] Date of Birth/Sex: 09/11/1965 (55 y.o. M) Treating RN: Carlene Coria Primary Care Provider: Margurite Auerbach Other Clinician: Jeanine Luz Referring Provider: Margurite Auerbach Treating Provider/Extender: Jeri Cos Weeks in Treatment: 0 Verbal / Phone Orders: No Diagnosis Coding ICD-10 Coding Code Description E11.622 Type 2 diabetes mellitus with other skin ulcer I10 Essential (primary) hypertension Follow-up Appointments o Return Appointment in 1 week. Bathing/ Shower/ Hygiene o May shower; gently cleanse wound with antibacterial soap,  rinse and pat dry prior to dressing wounds Wound Treatment Wound #3 - Elbow Cleanser: Soap and Water (Generic) Every Other Day/30 Days Discharge Instructions: Gently cleanse wound with antibacterial soap, rinse and pat dry prior to dressing wounds Primary Dressing: Prisma 4.34 (in) (Generic) Every Other Day/30 Days Discharge Instructions: Moisten w/normal saline Secondary Dressing: Mepilex Border Flex, 4x4 (in/in) (Generic) Every Other Day/30 Days Discharge Instructions: Apply to wound as directed. Do not cut. Electronic Signature(s) Signed: 03/24/2020 4:25:37 PM By: Worthy Keeler PA-C Signed: 03/25/2020 4:27:39 PM By: Carlene Coria RN Entered By: Carlene Coria on 03/24/2020 10:40:46 Jose Carroll ([id number]) -------------------------------------------------------------------------------- Problem List Details Patient Name: Jose Osgood A. Date of Service: 03/24/2020 9:30 AM Medical Record Number: [id number] Patient Account Number: [id number] Date of Birth/Sex: May 18, 1965 (55 y.o. M) Treating RN: Carlene Coria Primary Care Provider: Margurite Auerbach Other Clinician: Jeanine Luz Referring Provider: Margurite Auerbach Treating Provider/Extender: Jeri Cos Weeks in Treatment: 0 Active Problems ICD-10 Encounter Code  Description Active Date MDM Diagnosis E11.622 Type 2 diabetes mellitus with other skin ulcer 03/24/2020 No Yes I10 Essential (primary) hypertension 03/24/2020 No Yes 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 of skin of other sites with fat layer exposed 03/24/2020 No Yes Inactive Problems Resolved Problems Electronic Signature(s) Signed: 03/24/2020 10:44:47 AM By: Worthy Keeler PA-C Previous Signature: 03/24/2020 10:35:06 AM Version By: Worthy Keeler PA-C Previous Signature: 03/24/2020 9:43:00 AM Version By: Worthy Keeler PA-C Entered By: Worthy Keeler on 03/24/2020 10:44:47 Jose Carroll ([id number]) -------------------------------------------------------------------------------- Progress Note Details Patient Name: Jose Carroll. Date of Service: 03/24/2020 9:30 AM Medical Record Number: [id number] Patient Account Number: [id number] Date of Birth/Sex: 03/30/1965 (55 y.o. M) Treating RN: Carlene Coria Primary Care Provider: Margurite Auerbach Other Clinician: Jeanine Luz Referring Provider: Margurite Auerbach Treating Provider/Extender: Jeri Cos Weeks in Treatment: 0 Subjective Chief Complaint Information obtained from Patient Right elbow ulcer History of Present Illness (HPI) 04/27/16; The is a 55 yo type 2 diabetic who still smokes. He tells me that he has known PAD followed by Dr. Chancy Milroy at [place]. He thinks he had arterial studies by Dr Chancy Milroy (osp) less than a year ago. He tells me he had less than a block claudication at one point but is now on a combination of asa, Plavix and Zontivity and now has a 1/2 mile tolerance. 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 Approx 1 month ago he noted 2 open wounds on his right leg and foot . No obvious trauma. He has not been doing anything specific to the wounds. "leaving open to air". He denies a hx of CAD although this appears on his problem list. He was given a course of  clindamycin which he has completed His ABI in this clinic is 0.97. No major wound history although he has small healed areas on the right leg 05/04/16; I have obtained this man's records from [place]. He is indeed followed there. It would appear that he had arterial studies done on 07/09/15 this showed a right ABI of 0.83 a left of 0.86. He had Doppler waveforms that appear to be monophasic for the most part. He has had CTA on 2 occasions most recently on 07/25/15. This showed mild atherosclerotic calcification in the aorta and common iliac arteries without evidence of aneurysm. There was atherosclerotic irregularity and narrowing without occlusion or high-grade stenosis of the superficial and deep femoral arteries there was atherosclerotic irregularity and narrowing of the runoff vessels bilaterally which was  progressive to the prior study in 2014 at which time he had good 3 vessel runoff. 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 The patient's wounds have improved since last time although there are small and touchdown. Located on the right calf and right foot 05/18/16; Wounds are totally epithelialized Readmission: 03/24/2020 upon evaluation today patient appears to have an injury to his left elbow. He tells me that he is not exactly sure what he did to cause this injury. Nonetheless he tells me that even so he does have a history of hypertension as well as diabetes. Fortunately there is no signs of active infection at this point which is great news. I think the biggest thing is he may benefit from using a silver collagen dressing. Patient History Information obtained from Patient. Allergies amoxicillin, naproxen Family History Cancer - Mother, Diabetes - Mother,Father, Heart Disease - Mother, Hypertension - Mother, Stroke - Mother. Social History Current every day smoker - vape- no nicotine, Marital Status - Married, Alcohol Use - Never,  Drug Use - No History, Caffeine Use - Daily. Medical History Eyes Denies history of Cataracts, Glaucoma, Optic Neuritis Ear/Nose/Mouth/Throat Denies history of Chronic sinus problems/congestion, Middle ear problems Hematologic/Lymphatic Denies history of Anemia, Hemophilia, Human Immunodeficiency Virus, Lymphedema, Sickle Cell Disease Respiratory Patient has history of Chronic Obstructive Pulmonary Disease (COPD) Denies history of Aspiration, Asthma, Pneumothorax, Sleep Apnea, Tuberculosis Cardiovascular Patient has history of Angina, Coronary Artery Disease, Hypertension, Peripheral Venous Disease Denies history of Arrhythmia, Congestive Heart Failure, Deep Vein Thrombosis, Hypotension, Myocardial Infarction, Phlebitis, Vasculitis Gastrointestinal Denies history of Cirrhosis , Colitis, Crohn s, Hepatitis A, Hepatitis B, Hepatitis C Endocrine Patient has history of Type II Diabetes Denies history of Type I Diabetes Jose Carroll, Jose Carroll ([id number]) Genitourinary Denies history of End Stage Renal Disease Immunological Denies history of Lupus Erythematosus, Raynaud s, Scleroderma Integumentary (Skin) Denies history of History of Burn, History of pressure wounds Musculoskeletal Patient has history of Osteoarthritis Denies history of Gout, Rheumatoid Arthritis Neurologic Patient has history of Neuropathy Denies history of Dementia, Quadriplegia, Paraplegia, Seizure Disorder Oncologic Denies history of Received Chemotherapy, Received Radiation Psychiatric Denies history of Anorexia/bulimia, Confinement Anxiety Patient is treated with Insulin, Oral Agents. Blood sugar is tested. Blood sugar results noted at the following times: Breakfast - 148. Review of Systems (ROS) Constitutional Symptoms (General Health) Denies complaints or symptoms of Fatigue, Fever, Chills, Marked Weight Change. Eyes Denies complaints or symptoms of Dry Eyes, Vision Changes, Glasses /  Contacts. Ear/Nose/Mouth/Throat Denies complaints or symptoms of Difficult clearing ears, Sinusitis. Hematologic/Lymphatic Denies complaints or symptoms of Bleeding / Clotting Disorders, Human Immunodeficiency Virus. Respiratory Denies complaints or symptoms of Chronic or frequent coughs, Shortness of Breath. Cardiovascular Denies complaints or symptoms of Chest pain, LE edema. Gastrointestinal Denies complaints or symptoms of Frequent diarrhea, Nausea, Vomiting. Endocrine Denies complaints or symptoms of Hepatitis, Thyroid disease, Polydypsia (Excessive Thirst). Genitourinary Denies complaints or symptoms of Kidney failure/ Dialysis, Incontinence/dribbling. Immunological Denies complaints or symptoms of Hives, Itching. Integumentary (Skin) Complains or has symptoms of Wounds. Denies complaints or symptoms of Bleeding or bruising tendency, Breakdown, Swelling. Musculoskeletal Denies complaints or symptoms of Muscle Pain, Muscle Weakness. Neurologic Denies complaints or symptoms of Numbness/parasthesias, Focal/Weakness. Psychiatric Denies complaints or symptoms of Anxiety, Claustrophobia. Objective Constitutional sitting or standing blood pressure is within target range for patient.. pulse regular and within target range for patient.Marland Kitchen respirations regular, non- labored and within target range for patient.Marland Kitchen temperature within target range for patient.. Well-nourished and well-hydrated in no  acute distress. Vitals Time Taken: 9:51 AM, Height: 70 in, Source: Stated, Weight: 199 lbs, Source: Stated, BMI: 28.6, Temperature: 97.6 F, Pulse: 82 bpm, Respiratory Rate: 18 breaths/min, Blood Pressure: 97/63 mmHg. Eyes conjunctiva clear no eyelid edema noted. pupils equal round and reactive to light and accommodation. Ears, Nose, Mouth, and Throat no gross abnormality of ear auricles or external auditory canals. normal hearing noted during conversation. mucus membranes  moist. Respiratory normal breathing without difficulty. Cardiovascular 2+ dorsalis pedis/posterior tibialis pulses. no clubbing, cyanosis, significant edema, Jose Carroll, Jose A. ([id number]) Musculoskeletal normal gait and posture. no significant deformity or arthritic changes, no loss or range of motion, no clubbing. Psychiatric this patient is able to make decisions and demonstrates good insight into disease process. Alert and Oriented x 3. pleasant and cooperative. General Notes: Upon inspection patient's wound bed actually showed signs of fairly good granulation there was some slough and eschar buildup however did require sharp debridement to clear this away patient tolerated that today without complication. Post debridement the wound bed appears to be doing much better which is great news. No fevers, chills, nausea, vomiting, or diarrhea. Integumentary (Hair, Skin) Wound #3 status is Open. Original cause of wound was Gradually Appeared. The date acquired was: 01/12/2020. The wound is located on the Elbow. The wound measures 0.6cm length x 1.5cm width x 0.1cm depth; 0.707cm^2 area and 0.071cm^3 volume. There is Fat Layer (Subcutaneous Tissue) exposed. There is no tunneling or undermining noted. There is a none present amount of drainage noted. There is small (1-33%) pink granulation within the wound bed. There is a large (67-100%) amount of necrotic tissue within the wound bed including Eschar. Assessment Active Problems ICD-10 Type 2 diabetes mellitus with other skin ulcer Essential (primary) hypertension Non-pressure chronic ulcer of skin of other sites with fat layer exposed Procedures Wound #3 Pre-procedure diagnosis of Wound #3 is a To be determined located on the Elbow . There was a Excisional Skin/Subcutaneous Tissue Debridement with a total area of 0.9 sq cm performed by Tommie Sams., PA-C. With the following instrument(s): Curette to remove Viable and  Non-Viable tissue/material. Material removed includes Subcutaneous Tissue, Slough, Skin: Dermis, and Skin: Epidermis after achieving pain control using Lidocaine 4% Topical Solution. No specimens were taken. A time out was conducted at 10:35, prior to the start of the procedure. A Minimum amount of bleeding was controlled with Pressure. The procedure was tolerated well with a pain level of 0 throughout and a pain level of 0 following the procedure. Post Debridement Measurements: 0.6cm length x 1.5cm width x 0.1cm depth; 0.071cm^3 volume. Character of Wound/Ulcer Post Debridement is improved. Post procedure Diagnosis Wound #3: Same as Pre-Procedure Plan Follow-up Appointments: Return Appointment in 1 week. Bathing/ Shower/ Hygiene: May shower; gently cleanse wound with antibacterial soap, rinse and pat dry prior to dressing wounds WOUND #3: - Elbow Wound Laterality: Cleanser: Soap and Water (Generic) Every Other Day/30 Days Discharge Instructions: Gently cleanse wound with antibacterial soap, rinse and pat dry prior to dressing wounds Primary Dressing: Prisma 4.34 (in) (Generic) Every Other Day/30 Days Discharge Instructions: Moisten w/normal saline Secondary Dressing: Mepilex Border Flex, 4x4 (in/in) (Generic) Every Other Day/30 Days Discharge Instructions: Apply to wound as directed. Do not cut. 1. Would recommend currently that we go ahead and initiate treatment with a silver collagen dressing for the elbow. I think this is probably the best way to go based on what I am seeing currently. 2. I am also can recommend the patient continue to cover  this with a border foam dressing followed by stretch net to secure in place that will help him to be able to not lose this when he is riding his motorcycle and such. We will see patient back for reevaluation in 1 week here in the clinic. If anything worsens or changes patient will contact our office for additional recommendations. Jose Carroll, Jose Carroll ([id number]) Electronic Signature(s) Signed: 03/24/2020 12:51:50 PM By: Worthy Keeler PA-C Previous Signature: 03/24/2020 10:46:57 AM Version By: Worthy Keeler PA-C Entered By: Worthy Keeler on 03/24/2020 12:51:50 Jose Carroll ([id number]) -------------------------------------------------------------------------------- ROS/PFSH Details Patient Name: Jose Carroll Date of Service: 03/24/2020 9:30 AM Medical Record Number: [id number] Patient Account Number: [id number] Date of Birth/Sex: 1965/12/13 (54 y.o. M) Treating RN: Carlene Coria Primary Care Provider: Margurite Auerbach Other Clinician: Jeanine Luz Referring Provider: Margurite Auerbach Treating Provider/Extender: Jeri Cos Weeks in Treatment: 0 Information Obtained From Patient Constitutional Symptoms (General Health) Complaints and Symptoms: Negative for: Fatigue; Fever; Chills; Marked Weight Change Eyes Complaints and Symptoms: Negative for: Dry Eyes; Vision Changes; Glasses / Contacts Medical History: Negative for: Cataracts; Glaucoma; Optic Neuritis Ear/Nose/Mouth/Throat Complaints and Symptoms: Negative for: Difficult clearing ears; Sinusitis Medical History: Negative for: Chronic sinus problems/congestion; Middle ear problems Hematologic/Lymphatic Complaints and Symptoms: Negative for: Bleeding / Clotting Disorders; Human Immunodeficiency Virus Medical History: Negative for: Anemia; Hemophilia; Human Immunodeficiency Virus; Lymphedema; Sickle Cell Disease Respiratory Complaints and Symptoms: Negative for: Chronic or frequent coughs; Shortness of Breath Medical History: Positive for: Chronic Obstructive Pulmonary Disease (COPD) Negative for: Aspiration; Asthma; Pneumothorax; Sleep Apnea; Tuberculosis Cardiovascular Complaints and Symptoms: Negative for: Chest pain; LE edema Medical History: Positive for: Angina; Coronary Artery Disease; Hypertension; Peripheral Venous Disease Negative for:  Arrhythmia; Congestive Heart Failure; Deep Vein Thrombosis; Hypotension; Myocardial Infarction; Phlebitis; Vasculitis Gastrointestinal Complaints and Symptoms: Negative for: Frequent diarrhea; Nausea; Vomiting Medical History: Negative for: Cirrhosis ; Colitis; Crohnos; Hepatitis A; Hepatitis B; Hepatitis C Endocrine Jose Carroll, Jose A. ([id number]) Complaints and Symptoms: Negative for: Hepatitis; Thyroid disease; Polydypsia (Excessive Thirst) Medical History: Positive for: Type II Diabetes Negative for: Type I Diabetes Time with diabetes: 8 years Treated with: Insulin, Oral agents Blood sugar tested every day: Yes Tested : 2 x's a day Blood sugar testing results: Breakfast: 148 Genitourinary Complaints and Symptoms: Negative for: Kidney failure/ Dialysis; Incontinence/dribbling Medical History: Negative for: End Stage Renal Disease Immunological Complaints and Symptoms: Negative for: Hives; Itching Medical History: Negative for: Lupus Erythematosus; Raynaudos; Scleroderma Integumentary (Skin) Complaints and Symptoms: Positive for: Wounds Negative for: Bleeding or bruising tendency; Breakdown; Swelling Medical History: Negative for: History of Burn; History of pressure wounds Musculoskeletal Complaints and Symptoms: Negative for: Muscle Pain; Muscle Weakness Medical History: Positive for: Osteoarthritis Negative for: Gout; Rheumatoid Arthritis Neurologic Complaints and Symptoms: Negative for: Numbness/parasthesias; Focal/Weakness Medical History: Positive for: Neuropathy Negative for: Dementia; Quadriplegia; Paraplegia; Seizure Disorder Psychiatric Complaints and Symptoms: Negative for: Anxiety; Claustrophobia Medical History: Negative for: Anorexia/bulimia; Confinement Anxiety Oncologic Medical History: Negative for: Received Chemotherapy; Received Radiation Immunizations Pneumococcal Vaccine: Jose Carroll, Jose Carroll ([id number]) Received Pneumococcal  Vaccination: Yes Implantable Devices None Family and Social History Cancer: Yes - Mother; Diabetes: Yes - Mother,Father; Heart Disease: Yes - Mother; Hypertension: Yes - Mother; Stroke: Yes - Mother; Current every day smoker - vape- no nicotine; Marital Status - Married; Alcohol Use: Never; Drug Use: No History; Caffeine Use: Daily; Financial Concerns: No; Food, Clothing or Shelter Needs: No; Support System Lacking: No; Transportation Concerns: No Electronic Signature(s) Signed: 03/24/2020 11:47:12 AM By: Jeanine Luz Signed: 03/24/2020  4:25:37 PM By: Worthy Keeler PA-C Signed: 03/25/2020 4:27:39 PM By: Carlene Coria RN Entered By: Jeanine Luz on 03/24/2020 09:56:59 Jose Carroll ([id number]) -------------------------------------------------------------------------------- SuperBill Details Patient Name: Jose Osgood A. Date of Service: 03/24/2020 Medical Record Number: [id number] Patient Account Number: [id number] Date of Birth/Sex: September 01, 1965 (55 y.o. M) Treating RN: Carlene Coria Primary Care Provider: Margurite Auerbach Other Clinician: Jeanine Luz Referring Provider: Margurite Auerbach Treating Provider/Extender: Jeri Cos Weeks in Treatment: 0 Diagnosis Coding ICD-10 Codes Code Description E11.622 Type 2 diabetes mellitus with other skin ulcer I10 Essential (primary) hypertension 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 of skin of other sites with fat layer exposed Facility Procedures CPT4 Code: 33825053 Description: [place] VISIT-LEV 3 EST PT Modifier: Quantity: 1 CPT4 Code: 97673419 Description: 37902 - DEB SUBQ TISSUE 20 SQ CM/< Modifier: Quantity: 1 CPT4 Code: Description: ICD-10 Diagnosis Description 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 of skin of other sites with fat layer expose Modifier: d Quantity: Physician Procedures CPT4 Code: 4097353 Description: WC PHYS LEVEL 3 o NEW PT Modifier: 25 Quantity: 1 CPT4 Code: Description: ICD-10  Diagnosis Description E11.622 Type 2 diabetes mellitus with other skin ulcer I10 Essential (primary) hypertension 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

## 2020-03-24 NOTE — Progress Notes (Addendum)
HAMEED, KOLAR ([id number]) Visit Report for 03/24/2020 Allergy List Details Patient Name: Jose Carroll, Jose Carroll. Date of Service: 03/24/2020 9:30 AM Medical Record Number: [id number] Patient Account Number: [id number] Date of Birth/Sex: [birth year] (55 y.o. M) Treating RN: Carlene Coria Primary Care Laressa Bolinger: Margurite Auerbach Other Clinician: Jeanine Luz Referring Madalin Hughart: Margurite Auerbach Treating Isabella Ida/Extender: Jeri Cos Weeks in Treatment: 0 Allergies Active Allergies amoxicillin naproxen Allergy Notes Electronic Signature(s) Signed: 03/24/2020 11:47:12 AM By: Jeanine Luz Entered By: Jeanine Luz on 03/24/2020 09:51:50 Jose Carroll ([id number]) -------------------------------------------------------------------------------- Arrival Information Details Patient Name: Jose Carroll Date of Service: 03/24/2020 9:30 AM Medical Record Number: [id number] Patient Account Number: [id number] Date of Birth/Sex: 1966-01-12 (55 y.o. M) Treating RN: Carlene Coria Primary Care Janeen Watson: Margurite Auerbach Other Clinician: Jeanine Luz Referring Quinley Nesler: Margurite Auerbach Treating Cleven Jansma/Extender: Jeri Cos Weeks in Treatment: 0 Visit Information Patient Arrived: Ambulatory Arrival Time: 09:48 Accompanied By: self Transfer Assistance: None Patient Identification Verified: Yes Secondary Verification Process Completed: Yes Patient Has Alerts: Yes Patient Alerts: Patient on Blood Thinner Type II Diabetic History Since Last Visit Electronic Signature(s) Signed: 03/24/2020 11:47:12 AM By: Jeanine Luz Entered By: Jeanine Luz on 03/24/2020 10:06:14 Jose Carroll ([id number]) -------------------------------------------------------------------------------- Clinic Level of Care Assessment Details Patient Name: Jose Carroll. Date of Service: 03/24/2020 9:30 AM Medical Record Number: [id number] Patient Account Number: [id number] Date of  Birth/Sex: Nov 06, 1965 (55 y.o. M) Treating RN: Carlene Coria Primary Care Ivet Guerrieri: Margurite Auerbach Other Clinician: Jeanine Luz Referring Tamberlyn Midgley: Margurite Auerbach Treating Shyler Holzman/Extender: Jeri Cos Weeks in Treatment: 0 Clinic Level of Care Assessment Items TOOL 4 Quantity Score X - Use when only an EandM is performed on FOLLOW-UP visit 1 0 ASSESSMENTS - Nursing Assessment / Reassessment X - Reassessment of Co-morbidities (includes updates in patient status) 1 10 X- 1 5 Reassessment of Adherence to Treatment Plan ASSESSMENTS - Wound and Skin Assessment / Reassessment X - Simple Wound Assessment / Reassessment - one wound 1 5 []  - 0 Complex Wound Assessment / Reassessment - multiple wounds []  - 0 Dermatologic / Skin Assessment (not related to wound area) ASSESSMENTS - Focused Assessment []  - Circumferential Edema Measurements - multi extremities 0 []  - 0 Nutritional Assessment / Counseling / Intervention []  - 0 Lower Extremity Assessment (monofilament, tuning fork, pulses) []  - 0 Peripheral Arterial Disease Assessment (using hand held doppler) ASSESSMENTS - Ostomy and/or Continence Assessment and Care []  - Incontinence Assessment and Management 0 []  - 0 Ostomy Care Assessment and Management (repouching, etc.) PROCESS - Coordination of Care X - Simple Patient / Family Education for ongoing care 1 15 []  - 0 Complex (extensive) Patient / Family Education for ongoing care []  - 0 Staff obtains Programmer, systems, Records, Test Results / Process Orders []  - 0 Staff telephones HHA, Nursing Homes / Clarify orders / etc []  - 0 Routine Transfer to another Facility (non-emergent condition) []  - 0 Routine Hospital Admission (non-emergent condition) []  - 0 New Admissions / Biomedical engineer / Ordering NPWT, Apligraf, etc. []  - 0 Emergency Hospital Admission (emergent condition) X- 1 10 Simple Discharge Coordination []  - 0 Complex (extensive) Discharge  Coordination PROCESS - Special Needs []  - Pediatric / Minor Patient Management 0 []  - 0 Isolation Patient Management []  - 0 Hearing / Language / Visual special needs []  - 0 Assessment of Community assistance (transportation, D/C planning, etc.) []  - 0 Additional assistance / Altered mentation []  - 0 Support Surface(s) Assessment (bed, cushion, seat, etc.) INTERVENTIONS - Wound Cleansing / Measurement TAMOTSU, WIEDERHOLT A. ([id number]) X-  1 5 Simple Wound Cleansing - one wound []  - 0 Complex Wound Cleansing - multiple wounds X- 1 5 Wound Imaging (photographs - any number of wounds) []  - 0 Wound Tracing (instead of photographs) X- 1 5 Simple Wound Measurement - one wound []  - 0 Complex Wound Measurement - multiple wounds INTERVENTIONS - Wound Dressings X - Small Wound Dressing one or multiple wounds 1 10 []  - 0 Medium Wound Dressing one or multiple wounds []  - 0 Large Wound Dressing one or multiple wounds X- 1 5 Application of Medications - topical []  - 0 Application of Medications - injection INTERVENTIONS - Miscellaneous []  - External ear exam 0 []  - 0 Specimen Collection (cultures, biopsies, blood, body fluids, etc.) []  - 0 Specimen(s) / Culture(s) sent or taken to Lab for analysis []  - 0 Patient Transfer (multiple staff / Civil Service fast streamer / Similar devices) []  - 0 Simple Staple / Suture removal (25 or less) []  - 0 Complex Staple / Suture removal (26 or more) []  - 0 Hypo / Hyperglycemic Management (close monitor of Blood Glucose) []  - 0 Ankle / Brachial Index (ABI) - do not check if billed separately X- 1 5 Vital Signs Has the patient been seen at the hospital within the last three years: Yes Total Score: 80 Level Of Care: New/Established - Level 3 Electronic Signature(s) Signed: 03/25/2020 4:27:39 PM By: Carlene Coria RN Entered By: Carlene Coria on 03/24/2020 10:41:19 Jose Carroll  ([id number]) -------------------------------------------------------------------------------- Encounter Discharge Information Details Patient Name: Jose Osgood A. Date of Service: 03/24/2020 9:30 AM Medical Record Number: [id number] Patient Account Number: [id number] Date of Birth/Sex: [birth year] (55 y.o. M) Treating RN: Carlene Coria Primary Care Cleopatra Sardo: Margurite Auerbach Other Clinician: Jeanine Luz Referring Thorsten Climer: Charlynn Grimes, [place] Treating AmeLie Hollars/Extender: Jeri Cos Weeks in Treatment: 0 Encounter Discharge Information Items Post Procedure Vitals Discharge Condition: Stable Temperature (F): 97.6 Ambulatory Status: Ambulatory Pulse (bpm): 82 Discharge Destination: Home Respiratory Rate (breaths/min): 18 Transportation: Private Auto Blood Pressure (mmHg): 97/63 Accompanied By: self Schedule Follow-up Appointment: Yes Clinical Summary of Care: Patient Declined Electronic Signature(s) Signed: 03/25/2020 4:27:39 PM By: Carlene Coria RN Entered By: Carlene Coria on 03/24/2020 10:44:32 Jose Carroll ([id number]) -------------------------------------------------------------------------------- Lower Extremity Assessment Details Patient Name: Jose Carroll. Date of Service: 03/24/2020 9:30 AM Medical Record Number: [id number] Patient Account Number: [id number] Date of Birth/Sex: June 22, 1965 (55 y.o. M) Treating RN: Carlene Coria Primary Care Talbert Trembath: Margurite Auerbach Other Clinician: Jeanine Luz Referring Mckaila Duffus: Margurite Auerbach Treating Dahlia Nifong/Extender: Jeri Cos Weeks in Treatment: 0 Electronic Signature(s) Signed: 03/24/2020 11:47:12 AM By: Jeanine Luz Signed: 03/25/2020 4:27:39 PM By: Carlene Coria RN Entered By: Jeanine Luz on 03/24/2020 10:05:38 Jose Carroll ([id number]) -------------------------------------------------------------------------------- Multi Wound Chart Details Patient Name: Jose Osgood A. Date of Service:  03/24/2020 9:30 AM Medical Record Number: [id number] Patient Account Number: [id number] Date of Birth/Sex: 1965/09/13 (55 y.o. M) Treating RN: Carlene Coria Primary Care Kali Ambler: Margurite Auerbach Other Clinician: Jeanine Luz Referring Sheily Lineman: Charlynn Grimes, Chelsea Treating Meoshia Billing/Extender: Jeri Cos Weeks in Treatment: 0 Vital Signs Height(in): 70 Pulse(bpm): 2 Weight(lbs): 199 Blood Pressure(mmHg): 97/63 Body Mass Index(BMI): 29 Temperature(F): 97.6 Respiratory Rate(breaths/min): 18 Photos: [N/A:N/A] Wound Location: Elbow N/A N/A Wounding Event: Gradually Appeared N/A N/A Primary Etiology: To be determined N/A N/A Comorbid History: Chronic Obstructive Pulmonary N/A N/A Disease (COPD), Angina, Coronary Artery Disease, Hypertension, Peripheral Venous Disease, Type II Diabetes, Osteoarthritis, Neuropathy Date Acquired: 01/12/2020 N/A N/A Weeks of Treatment: 0 N/A N/A Wound Status: Open N/A N/A Measurements L x W x D (cm)  0.6x1.5x0.1 N/A N/A Area (cm) : 0.707 N/A N/A Volume (cm) : 0.071 N/A N/A Classification: Partial Thickness N/A N/A Exudate Amount: None Present N/A N/A Granulation Amount: Small (1-33%) N/A N/A Granulation Quality: Pink N/A N/A Necrotic Amount: Large (67-100%) N/A N/A Necrotic Tissue: Eschar N/A N/A Exposed Structures: Fat Layer (Subcutaneous Tissue): N/A N/A Yes Fascia: No Tendon: No Muscle: No Joint: No Bone: No Treatment Notes Electronic Signature(s) Signed: 03/25/2020 4:27:39 PM By: Carlene Coria RN Entered By: Carlene Coria on 03/24/2020 10:37:34 Jose Carroll ([id number]) -------------------------------------------------------------------------------- Multi-Disciplinary Care Plan Details Patient Name: Jose Osgood A. Date of Service: 03/24/2020 9:30 AM Medical Record Number: [id number] Patient Account Number: [id number] Date of Birth/Sex: 18-Jan-1965 (55 y.o. M) Treating RN: Carlene Coria Primary Care Gurneet Matarese: Margurite Auerbach Other Clinician: Jeanine Luz Referring Messiah Ahr: Margurite Auerbach Treating Arkeem Harts/Extender: Jeri Cos Weeks in Treatment: 0 Active Inactive Electronic Signature(s) Signed: 03/28/2020 6:18:03 PM By: Gretta Cool, BSN, RN, CWS, Kim RN, BSN Signed: 04/18/2020 8:11:40 AM By: Carlene Coria RN Previous Signature: 03/25/2020 4:27:39 PM Version By: Carlene Coria RN Entered By: Gretta Cool, BSN, RN, CWS, Kim on 03/28/2020 18:18:03 Jose Carroll ([id number]) -------------------------------------------------------------------------------- Pain Assessment Details Patient Name: Jose Osgood A. Date of Service: 03/24/2020 9:30 AM Medical Record Number: [id number] Patient Account Number: [id number] Date of Birth/Sex: [birth year] (55 y.o. M) Treating RN: Carlene Coria Primary Care Erandy Mceachern: Margurite Auerbach Other Clinician: Jeanine Luz Referring Tiandra Swoveland: Margurite Auerbach Treating Avigail Pilling/Extender: Jeri Cos Weeks in Treatment: 0 Active Problems Location of Pain Severity and Description of Pain Patient Has Paino No Site Locations Pain Management and Medication Current Pain Management: Electronic Signature(s) Signed: 03/24/2020 11:47:12 AM By: Jeanine Luz Signed: 03/25/2020 4:27:39 PM By: Carlene Coria RN Entered By: Jeanine Luz on 03/24/2020 09:49:28 Jose Carroll ([id number]) -------------------------------------------------------------------------------- Patient/Caregiver Education Details Patient Name: Jose Carroll. Date of Service: 03/24/2020 9:30 AM Medical Record Number: [id number] Patient Account Number: [id number] Date of Birth/Gender: 1965-02-18 (55 y.o. M) Treating RN: Carlene Coria Primary Care Physician: Margurite Auerbach Other Clinician: Jeanine Luz Referring Physician: Margurite Auerbach Treating Physician/Extender: Skipper Cliche in Treatment: 0 Education Assessment Education Provided To: Patient Education Topics Provided Wound/Skin  Impairment: Methods: Explain/Verbal Responses: State content correctly Electronic Signature(s) Signed: 03/25/2020 4:27:39 PM By: Carlene Coria RN Entered By: Carlene Coria on 03/24/2020 10:42:22 Jose Carroll ([id number]) -------------------------------------------------------------------------------- Wound Assessment Details Patient Name: Jose Osgood A. Date of Service: 03/24/2020 9:30 AM Medical Record Number: [id number] Patient Account Number: [id number] Date of Birth/Sex: [birth year] (55 y.o. M) Treating RN: Carlene Coria Primary Care Ailana Cuadrado: Margurite Auerbach Other Clinician: Jeanine Luz Referring Carla Rashad: Margurite Auerbach Treating Zhion Pevehouse/Extender: Jeri Cos Weeks in Treatment: 0 Wound Status Wound Number: 3 Primary To be determined Etiology: Wound Location: Elbow Wound Open Wounding Event: Gradually Appeared Status: Date Acquired: 01/12/2020 Comorbid Chronic Obstructive Pulmonary Disease (COPD), Angina, Weeks Of Treatment: 0 History: Coronary Artery Disease, Hypertension, Peripheral Venous Clustered Wound: No Disease, Type II Diabetes, Osteoarthritis, Neuropathy Photos Wound Measurements Length: (cm) 0.6 Width: (cm) 1.5 Depth: (cm) 0.1 Area: (cm) 0.707 Volume: (cm) 0.071 % Reduction in Area: % Reduction in Volume: Tunneling: No Undermining: No Wound Description Classification: Partial Thickness Exudate Amount: None Present Foul Odor After Cleansing: No Slough/Fibrino No Wound Bed Granulation Amount: Small (1-33%) Exposed Structure Granulation Quality: Pink Fascia Exposed: No Necrotic Amount: Large (67-100%) Fat Layer (Subcutaneous Tissue) Exposed: Yes Necrotic Quality: Eschar Tendon Exposed: No Muscle Exposed: No Joint Exposed: No Bone Exposed: No Electronic Signature(s) Signed: 03/24/2020 11:47:12 AM By: Jeanine Luz Signed: 03/25/2020 4:27:39 PM By: Dolores Lory,  Carrie RN Entered By: Jeanine Luz on 03/24/2020 10:02:37 Jose Carroll ([id number]) -------------------------------------------------------------------------------- Vitals Details Patient Name: Jose Osgood A. Date of Service: 03/24/2020 9:30 AM Medical Record Number: [id number] Patient Account Number: [id number] Date of Birth/Sex: Jul 10, 1965 (55 y.o. M) Treating RN: Carlene Coria Primary Care Porter Moes: Margurite Auerbach Other Clinician: Jeanine Luz Referring Graison Leinberger: Margurite Auerbach Treating Kerline Trahan/Extender: Jeri Cos Weeks in Treatment: 0 Vital Signs Time Taken: 09:51 Temperature (F): 97.6 Height (in): 70 Pulse (bpm): 82 Source: Stated Respiratory Rate (breaths/min): 18 Weight (lbs): 199 Blood Pressure (mmHg): 97/63 Source: Stated Reference Range: 80 - 120 mg / dl Body Mass Index (BMI): 28.6 Electronic Signature(s) Signed: 03/24/2020 11:47:12 AM By: Jeanine Luz Entered By: Jeanine Luz on 03/24/2020 09:51:34

## 2020-03-25 ENCOUNTER — Other Ambulatory Visit: Payer: Self-pay | Admitting: Student in an Organized Health Care Education/Training Program

## 2020-03-25 ENCOUNTER — Inpatient Hospital Stay: Payer: Medicare HMO

## 2020-03-25 DIAGNOSIS — G894 Chronic pain syndrome: Secondary | ICD-10-CM

## 2020-03-25 NOTE — Progress Notes (Signed)
Jose Carroll, Jose Carroll ([id number]) Visit Report for 03/24/2020 Abuse/Suicide Risk Screen Details Patient Name: Jose Carroll, Jose Carroll. Date of Service: 03/24/2020 9:30 AM Medical Record Number: [id number] Patient Account Number: [id number] Date of Birth/Sex: 1965/03/25 (55 y.o. M) Treating RN: Carlene Coria Primary Care Laren Orama: Margurite Auerbach Other Clinician: Jeanine Luz Referring Josephene Marrone: Margurite Auerbach Treating Loise Esguerra/Extender: Jeri Cos Weeks in Treatment: 0 Abuse/Suicide Risk Screen Items Answer ABUSE RISK SCREEN: Has anyone close to you tried to hurt or harm you recentlyo No Do you feel uncomfortable with anyone in your familyo No Has anyone forced you do things that you didnot want to doo No Electronic Signature(s) Signed: 03/24/2020 11:47:12 AM By: Jeanine Luz Signed: 03/25/2020 4:27:39 PM By: Carlene Coria RN Entered By: Jeanine Luz on 03/24/2020 09:57:05 Jose Carroll ([id number]) -------------------------------------------------------------------------------- Activities of Daily Living Details Patient Name: Jose Carroll. Date of Service: 03/24/2020 9:30 AM Medical Record Number: [id number] Patient Account Number: [id number] Date of Birth/Sex: 28-Apr-1965 (55 y.o. M) Treating RN: Carlene Coria Primary Care Roch Quach: Margurite Auerbach Other Clinician: Jeanine Luz Referring Shebra Muldrow: Margurite Auerbach Treating Jeannifer Drakeford/Extender: Jeri Cos Weeks in Treatment: 0 Activities of Daily Living Items Answer Activities of Daily Living (Please select one for each item) Drive Automobile Not Able Take Medications Completely Able Use Telephone Completely Able Care for Appearance Completely Able Use Toilet Completely Able Bath / Shower Completely Able Dress Self Completely Able Feed Self Completely Able Walk Completely Able Get In / Out Bed Completely Able Housework Completely Able Prepare Meals Completely Able Handle Money Completely Able Shop for Self  Completely Able Electronic Signature(s) Signed: 03/24/2020 11:47:12 AM By: Jeanine Luz Signed: 03/25/2020 4:27:39 PM By: Carlene Coria RN Entered By: Jeanine Luz on 03/24/2020 09:57:53 Jose Carroll ([id number]) -------------------------------------------------------------------------------- Education Screening Details Patient Name: Jose Carroll. Date of Service: 03/24/2020 9:30 AM Medical Record Number: [id number] Patient Account Number: [id number] Date of Birth/Sex: July 17, 1965 (55 y.o. M) Treating RN: Carlene Coria Primary Care Anamika Kueker: Margurite Auerbach Other Clinician: Jeanine Luz Referring Tristian Sickinger: Margurite Auerbach Treating Jovonta Levit/Extender: Jeri Cos Weeks in Treatment: 0 Primary Learner Assessed: Patient Learning Preferences/Education Level/Primary Language Learning Preference: Explanation, Demonstration Highest Education Level: Grade School Preferred Language: English Cognitive Barrier Language Barrier: No Translator Needed: No Memory Deficit: No Emotional Barrier: No Cultural/Religious Beliefs Affecting Medical Care: No Physical Barrier Impaired Vision: No Impaired Hearing: No Decreased Hand dexterity: No Knowledge/Comprehension Knowledge Level: High Comprehension Level: High Ability to understand written instructions: High Ability to understand verbal instructions: High Motivation Anxiety Level: Calm Cooperation: Cooperative Education Importance: Acknowledges Need Interest in Health Problems: Asks Questions Perception: Coherent Willingness to Engage in Self-Management High Activities: Readiness to Engage in Self-Management High Activities: Electronic Signature(s) Signed: 03/24/2020 11:47:12 AM By: Jeanine Luz Signed: 03/25/2020 4:27:39 PM By: Carlene Coria RN Entered By: Jeanine Luz on 03/24/2020 09:58:28 Jose Carroll ([id number]) -------------------------------------------------------------------------------- Fall Risk  Assessment Details Patient Name: Jose Carroll. Date of Service: 03/24/2020 9:30 AM Medical Record Number: [id number] Patient Account Number: [id number] Date of Birth/Sex: 1965/07/08 (55 y.o. M) Treating RN: Carlene Coria Primary Care Aly Hauser: Margurite Auerbach Other Clinician: Jeanine Luz Referring Onna Nodal: Margurite Auerbach Treating Gilverto Dileonardo/Extender: Jeri Cos Weeks in Treatment: 0 Fall Risk Assessment Items Have you had 2 or more falls in the last 12 monthso 0 No Have you had any fall that resulted in injury in the last 12 monthso 0 No FALLS RISK SCREEN History of falling - immediate or within 3 months 25 Yes Secondary diagnosis (Do you have 2 or more medical diagnoseso) 15 Yes  Ambulatory aid None/bed rest/wheelchair/nurse 0 No Crutches/cane/walker 0 No Furniture 0 No Intravenous therapy Access/Saline/Heparin Lock 0 No Gait/Transferring Normal/ bed rest/ wheelchair 0 No Weak (short steps with or without shuffle, stooped but able to lift head while walking, may 0 No seek support from furniture) Impaired (short steps with shuffle, may have difficulty arising from chair, head down, impaired 0 No balance) Mental Status Oriented to own ability 0 No Electronic Signature(s) Signed: 03/24/2020 11:47:12 AM By: Jeanine Luz Signed: 03/25/2020 4:27:39 PM By: Carlene Coria RN Entered By: Jeanine Luz on 03/24/2020 09:58:45 Jose Carroll ([id number]) -------------------------------------------------------------------------------- Foot Assessment Details Patient Name: Jose Osgood A. Date of Service: 03/24/2020 9:30 AM Medical Record Number: [id number] Patient Account Number: [id number] Date of Birth/Sex: September 15, 1965 (55 y.o. M) Treating RN: Carlene Coria Primary Care Kashis Penley: Margurite Auerbach Other Clinician: Jeanine Luz Referring Lylla Eifler: Margurite Auerbach Treating Markee Matera/Extender: Jeri Cos Weeks in Treatment: 0 Foot Assessment Items Site Locations +  = Sensation present, - = Sensation absent, C = Callus, U = Ulcer R = Redness, W = Warmth, M = Maceration, PU = Pre-ulcerative lesion F = Fissure, S = Swelling, D = Dryness Assessment Right: Left: Other Deformity: No No Prior Foot Ulcer: No No Prior Amputation: No No Charcot Joint: No No Ambulatory Status: Ambulatory Without Help Gait: Electronic Signature(s) Signed: 03/24/2020 11:47:12 AM By: Jeanine Luz Signed: 03/25/2020 4:27:39 PM By: Carlene Coria RN Entered By: Jeanine Luz on 03/24/2020 09:59:09 Jose Carroll ([id number]) -------------------------------------------------------------------------------- Nutrition Risk Screening Details Patient Name: Jose Osgood A. Date of Service: 03/24/2020 9:30 AM Medical Record Number: [id number] Patient Account Number: [id number] Date of Birth/Sex: [birth year] (55 y.o. M) Treating RN: Carlene Coria Primary Care Jamine Highfill: Margurite Auerbach Other Clinician: Jeanine Luz Referring Codi Kertz: Margurite Auerbach Treating Adriel Kessen/Extender: Jeri Cos Weeks in Treatment: 0 Height (in): 70 Weight (lbs): 199 Body Mass Index (BMI): 28.6 Nutrition Risk Screening Items Score Screening NUTRITION RISK SCREEN: I have an illness or condition that made me change the kind and/or amount of food I eat 0 No I eat fewer than two meals per day 0 No I eat few fruits and vegetables, or milk products 0 No I have three or more drinks of beer, liquor or wine almost every day 0 No I have tooth or mouth problems that make it hard for me to eat 0 No I don't always have enough money to buy the food I need 0 No I eat alone most of the time 0 No I take three or more different prescribed or over-the-counter drugs a day 0 No Without wanting to, I have lost or gained 10 pounds in the last six months 0 No I am not always physically able to shop, cook and/or feed myself 0 No Nutrition Protocols Good Risk Protocol 0 No interventions needed Moderate Risk  Protocol High Risk Proctocol Risk Level: Good Risk Score: 0 Electronic Signature(s) Signed: 03/24/2020 11:47:12 AM By: Jeanine Luz Signed: 03/25/2020 4:27:39 PM By: Carlene Coria RN Entered By: Jeanine Luz on 03/24/2020 09:58:56

## 2020-03-27 DIAGNOSIS — M79605 Pain in left leg: Secondary | ICD-10-CM | POA: Diagnosis not present

## 2020-03-27 DIAGNOSIS — M545 Low back pain, unspecified: Secondary | ICD-10-CM | POA: Diagnosis not present

## 2020-03-27 DIAGNOSIS — M79604 Pain in right leg: Secondary | ICD-10-CM | POA: Diagnosis not present

## 2020-03-27 DIAGNOSIS — G894 Chronic pain syndrome: Secondary | ICD-10-CM | POA: Diagnosis not present

## 2020-03-28 ENCOUNTER — Inpatient Hospital Stay: Payer: Medicare HMO | Attending: Oncology | Admitting: Oncology

## 2020-03-28 ENCOUNTER — Telehealth: Payer: Self-pay | Admitting: Oncology

## 2020-03-28 NOTE — Telephone Encounter (Signed)
Spoke with patient to r/s missed lab appt on 3/15. Rescheduled labs and f/u visit with Dr. Janese Banks. Sending AVS in the mail.

## 2020-03-31 ENCOUNTER — Ambulatory Visit: Payer: Medicare HMO | Admitting: Physician Assistant

## 2020-03-31 DIAGNOSIS — M542 Cervicalgia: Secondary | ICD-10-CM | POA: Diagnosis not present

## 2020-03-31 DIAGNOSIS — M9901 Segmental and somatic dysfunction of cervical region: Secondary | ICD-10-CM | POA: Diagnosis not present

## 2020-03-31 DIAGNOSIS — M9902 Segmental and somatic dysfunction of thoracic region: Secondary | ICD-10-CM | POA: Diagnosis not present

## 2020-03-31 DIAGNOSIS — M5414 Radiculopathy, thoracic region: Secondary | ICD-10-CM | POA: Diagnosis not present

## 2020-04-01 DIAGNOSIS — I10 Essential (primary) hypertension: Secondary | ICD-10-CM | POA: Diagnosis not present

## 2020-04-01 DIAGNOSIS — E118 Type 2 diabetes mellitus with unspecified complications: Secondary | ICD-10-CM | POA: Diagnosis not present

## 2020-04-01 DIAGNOSIS — E785 Hyperlipidemia, unspecified: Secondary | ICD-10-CM | POA: Diagnosis not present

## 2020-04-01 DIAGNOSIS — E669 Obesity, unspecified: Secondary | ICD-10-CM | POA: Diagnosis not present

## 2020-04-02 DIAGNOSIS — M9901 Segmental and somatic dysfunction of cervical region: Secondary | ICD-10-CM | POA: Diagnosis not present

## 2020-04-02 DIAGNOSIS — M9902 Segmental and somatic dysfunction of thoracic region: Secondary | ICD-10-CM | POA: Diagnosis not present

## 2020-04-02 DIAGNOSIS — M5414 Radiculopathy, thoracic region: Secondary | ICD-10-CM | POA: Diagnosis not present

## 2020-04-02 DIAGNOSIS — M542 Cervicalgia: Secondary | ICD-10-CM | POA: Diagnosis not present

## 2020-04-03 DIAGNOSIS — J449 Chronic obstructive pulmonary disease, unspecified: Secondary | ICD-10-CM | POA: Diagnosis not present

## 2020-04-03 DIAGNOSIS — G8929 Other chronic pain: Secondary | ICD-10-CM | POA: Diagnosis not present

## 2020-04-03 DIAGNOSIS — E785 Hyperlipidemia, unspecified: Secondary | ICD-10-CM | POA: Diagnosis not present

## 2020-04-03 DIAGNOSIS — J309 Allergic rhinitis, unspecified: Secondary | ICD-10-CM | POA: Diagnosis not present

## 2020-04-04 DIAGNOSIS — M9902 Segmental and somatic dysfunction of thoracic region: Secondary | ICD-10-CM | POA: Diagnosis not present

## 2020-04-04 DIAGNOSIS — M5414 Radiculopathy, thoracic region: Secondary | ICD-10-CM | POA: Diagnosis not present

## 2020-04-04 DIAGNOSIS — M9901 Segmental and somatic dysfunction of cervical region: Secondary | ICD-10-CM | POA: Diagnosis not present

## 2020-04-04 DIAGNOSIS — M542 Cervicalgia: Secondary | ICD-10-CM | POA: Diagnosis not present

## 2020-04-08 ENCOUNTER — Ambulatory Visit: Payer: Medicare HMO

## 2020-04-09 DIAGNOSIS — G8929 Other chronic pain: Secondary | ICD-10-CM | POA: Diagnosis not present

## 2020-04-09 DIAGNOSIS — G8928 Other chronic postprocedural pain: Secondary | ICD-10-CM | POA: Diagnosis not present

## 2020-04-09 DIAGNOSIS — I10 Essential (primary) hypertension: Secondary | ICD-10-CM | POA: Diagnosis not present

## 2020-04-09 DIAGNOSIS — M545 Low back pain, unspecified: Secondary | ICD-10-CM | POA: Diagnosis not present

## 2020-04-09 DIAGNOSIS — G894 Chronic pain syndrome: Secondary | ICD-10-CM | POA: Diagnosis not present

## 2020-04-09 DIAGNOSIS — M961 Postlaminectomy syndrome, not elsewhere classified: Secondary | ICD-10-CM | POA: Diagnosis not present

## 2020-04-09 DIAGNOSIS — M5440 Lumbago with sciatica, unspecified side: Secondary | ICD-10-CM | POA: Diagnosis not present

## 2020-04-09 DIAGNOSIS — J449 Chronic obstructive pulmonary disease, unspecified: Secondary | ICD-10-CM | POA: Diagnosis not present

## 2020-04-11 DIAGNOSIS — M542 Cervicalgia: Secondary | ICD-10-CM | POA: Diagnosis not present

## 2020-04-11 DIAGNOSIS — M5414 Radiculopathy, thoracic region: Secondary | ICD-10-CM | POA: Diagnosis not present

## 2020-04-11 DIAGNOSIS — M9902 Segmental and somatic dysfunction of thoracic region: Secondary | ICD-10-CM | POA: Diagnosis not present

## 2020-04-11 DIAGNOSIS — M9901 Segmental and somatic dysfunction of cervical region: Secondary | ICD-10-CM | POA: Diagnosis not present

## 2020-04-14 DIAGNOSIS — E119 Type 2 diabetes mellitus without complications: Secondary | ICD-10-CM | POA: Diagnosis not present

## 2020-04-14 DIAGNOSIS — M542 Cervicalgia: Secondary | ICD-10-CM | POA: Diagnosis not present

## 2020-04-14 DIAGNOSIS — M9902 Segmental and somatic dysfunction of thoracic region: Secondary | ICD-10-CM | POA: Diagnosis not present

## 2020-04-14 DIAGNOSIS — M5414 Radiculopathy, thoracic region: Secondary | ICD-10-CM | POA: Diagnosis not present

## 2020-04-14 DIAGNOSIS — M9901 Segmental and somatic dysfunction of cervical region: Secondary | ICD-10-CM | POA: Diagnosis not present

## 2020-04-17 DIAGNOSIS — J449 Chronic obstructive pulmonary disease, unspecified: Secondary | ICD-10-CM | POA: Diagnosis not present

## 2020-04-18 DIAGNOSIS — M9902 Segmental and somatic dysfunction of thoracic region: Secondary | ICD-10-CM | POA: Diagnosis not present

## 2020-04-18 DIAGNOSIS — M542 Cervicalgia: Secondary | ICD-10-CM | POA: Diagnosis not present

## 2020-04-18 DIAGNOSIS — M9901 Segmental and somatic dysfunction of cervical region: Secondary | ICD-10-CM | POA: Diagnosis not present

## 2020-04-18 DIAGNOSIS — M5414 Radiculopathy, thoracic region: Secondary | ICD-10-CM | POA: Diagnosis not present

## 2020-04-21 ENCOUNTER — Inpatient Hospital Stay: Payer: Medicare HMO | Attending: Oncology

## 2020-04-21 ENCOUNTER — Telehealth: Payer: Self-pay

## 2020-04-22 ENCOUNTER — Other Ambulatory Visit: Payer: Self-pay

## 2020-04-22 ENCOUNTER — Ambulatory Visit
Admission: RE | Admit: 2020-04-22 | Discharge: 2020-04-22 | Disposition: A | Discharge status: Home / Self Care | Payer: Medicare HMO | Source: Ambulatory Visit | Attending: Student in an Organized Health Care Education/Training Program | Admitting: Student in an Organized Health Care Education/Training Program

## 2020-04-22 ENCOUNTER — Other Ambulatory Visit
Admission: RE | Admit: 2020-04-22 | Discharge: 2020-04-22 | Disposition: A | Discharge status: Home / Self Care | Payer: Medicare HMO | Source: Home / Self Care | Attending: Student in an Organized Health Care Education/Training Program | Admitting: Student in an Organized Health Care Education/Training Program

## 2020-04-22 DIAGNOSIS — Z4889 Encounter for other specified surgical aftercare: Secondary | ICD-10-CM

## 2020-04-22 DIAGNOSIS — M5136 Other intervertebral disc degeneration, lumbar region: Secondary | ICD-10-CM | POA: Diagnosis not present

## 2020-04-22 DIAGNOSIS — M546 Pain in thoracic spine: Secondary | ICD-10-CM | POA: Insufficient documentation

## 2020-04-22 DIAGNOSIS — G8929 Other chronic pain: Secondary | ICD-10-CM

## 2020-04-22 DIAGNOSIS — M5126 Other intervertebral disc displacement, lumbar region: Secondary | ICD-10-CM | POA: Diagnosis not present

## 2020-04-22 DIAGNOSIS — I2510 Atherosclerotic heart disease of native coronary artery without angina pectoris: Secondary | ICD-10-CM | POA: Diagnosis not present

## 2020-04-22 DIAGNOSIS — M5134 Other intervertebral disc degeneration, thoracic region: Secondary | ICD-10-CM | POA: Diagnosis not present

## 2020-04-22 DIAGNOSIS — M961 Postlaminectomy syndrome, not elsewhere classified: Secondary | ICD-10-CM | POA: Diagnosis not present

## 2020-04-22 DIAGNOSIS — M4807 Spinal stenosis, lumbosacral region: Secondary | ICD-10-CM | POA: Insufficient documentation

## 2020-04-22 DIAGNOSIS — G894 Chronic pain syndrome: Secondary | ICD-10-CM

## 2020-04-22 DIAGNOSIS — N281 Cyst of kidney, acquired: Secondary | ICD-10-CM | POA: Diagnosis not present

## 2020-04-22 DIAGNOSIS — M5416 Radiculopathy, lumbar region: Secondary | ICD-10-CM

## 2020-04-22 DIAGNOSIS — M47817 Spondylosis without myelopathy or radiculopathy, lumbosacral region: Secondary | ICD-10-CM | POA: Diagnosis not present

## 2020-04-22 DIAGNOSIS — D1809 Hemangioma of other sites: Secondary | ICD-10-CM | POA: Diagnosis not present

## 2020-04-22 DIAGNOSIS — M4804 Spinal stenosis, thoracic region: Secondary | ICD-10-CM | POA: Diagnosis not present

## 2020-04-22 DIAGNOSIS — M5124 Other intervertebral disc displacement, thoracic region: Secondary | ICD-10-CM | POA: Diagnosis not present

## 2020-04-22 DIAGNOSIS — M47816 Spondylosis without myelopathy or radiculopathy, lumbar region: Secondary | ICD-10-CM | POA: Diagnosis not present

## 2020-04-22 LAB — CREATININE, SERUM
Creatinine, Ser: 1.24 mg/dL (ref 0.61–1.24)
GFR, Estimated: >60 mL/min (ref >60)

## 2020-04-22 LAB — BUN: BUN: 5 mg/dL — ABNORMAL LOW (ref 6–20)

## 2020-04-22 MED ORDER — GADOBUTROL 1 MMOL/ML IV SOLN
9.0000 mL | Freq: Once | INTRAVENOUS | Status: AC | PRN
  Administered 2020-04-22: 9 mL via INTRAVENOUS

## 2020-04-23 DIAGNOSIS — E785 Hyperlipidemia, unspecified: Secondary | ICD-10-CM | POA: Diagnosis not present

## 2020-04-23 DIAGNOSIS — R69 Illness, unspecified: Secondary | ICD-10-CM | POA: Diagnosis not present

## 2020-04-23 DIAGNOSIS — I10 Essential (primary) hypertension: Secondary | ICD-10-CM | POA: Diagnosis not present

## 2020-04-23 DIAGNOSIS — I2510 Atherosclerotic heart disease of native coronary artery without angina pectoris: Secondary | ICD-10-CM | POA: Diagnosis not present

## 2020-04-23 DIAGNOSIS — Z9861 Coronary angioplasty status: Secondary | ICD-10-CM | POA: Diagnosis not present

## 2020-04-23 DIAGNOSIS — E781 Pure hyperglyceridemia: Secondary | ICD-10-CM | POA: Diagnosis not present

## 2020-04-23 DIAGNOSIS — F17210 Nicotine dependence, cigarettes, uncomplicated: Secondary | ICD-10-CM | POA: Diagnosis not present

## 2020-04-24 ENCOUNTER — Inpatient Hospital Stay: Payer: Medicare HMO | Admitting: Oncology

## 2020-04-24 ENCOUNTER — Telehealth: Payer: Self-pay | Admitting: *Deleted

## 2020-04-24 NOTE — Telephone Encounter (Signed)
Attempted to call patient to ask which physician prescribes Brillinta so that we may obtain clearance to stop for SCS trial. Message left.

## 2020-04-24 NOTE — Telephone Encounter (Signed)
RN called and call went directly to voicemail.  RN requested that pt please log onto mychart for video visit if he was planning on doing visit today.  RN instructed pt to please call clinic to reschedule visit if unable connect today.

## 2020-04-24 NOTE — Telephone Encounter (Signed)
Called pt and call went directly to voicemail.  Left message that pt was scheduled to have video mychart visit at 1130 today with Dr Janese Banks.  Instructed pt to please call us and number left to clinic pod as well stated to please log on to mychart by 1130 if possible.

## 2020-05-02 DIAGNOSIS — M5414 Radiculopathy, thoracic region: Secondary | ICD-10-CM | POA: Diagnosis not present

## 2020-05-02 DIAGNOSIS — M9901 Segmental and somatic dysfunction of cervical region: Secondary | ICD-10-CM | POA: Diagnosis not present

## 2020-05-02 DIAGNOSIS — M9902 Segmental and somatic dysfunction of thoracic region: Secondary | ICD-10-CM | POA: Diagnosis not present

## 2020-05-02 DIAGNOSIS — M542 Cervicalgia: Secondary | ICD-10-CM | POA: Diagnosis not present

## 2020-05-07 DIAGNOSIS — Z79891 Long term (current) use of opiate analgesic: Secondary | ICD-10-CM | POA: Diagnosis not present

## 2020-05-07 DIAGNOSIS — F1120 Opioid dependence, uncomplicated: Secondary | ICD-10-CM | POA: Diagnosis not present

## 2020-05-12 DIAGNOSIS — J449 Chronic obstructive pulmonary disease, unspecified: Secondary | ICD-10-CM | POA: Diagnosis not present

## 2020-05-14 DIAGNOSIS — G8929 Other chronic pain: Secondary | ICD-10-CM | POA: Diagnosis not present

## 2020-05-14 DIAGNOSIS — M961 Postlaminectomy syndrome, not elsewhere classified: Secondary | ICD-10-CM | POA: Diagnosis not present

## 2020-05-14 DIAGNOSIS — I10 Essential (primary) hypertension: Secondary | ICD-10-CM | POA: Diagnosis not present

## 2020-05-14 DIAGNOSIS — M5440 Lumbago with sciatica, unspecified side: Secondary | ICD-10-CM | POA: Diagnosis not present

## 2020-05-14 DIAGNOSIS — M545 Low back pain, unspecified: Secondary | ICD-10-CM | POA: Diagnosis not present

## 2020-05-14 DIAGNOSIS — G8928 Other chronic postprocedural pain: Secondary | ICD-10-CM | POA: Diagnosis not present

## 2020-05-14 DIAGNOSIS — J449 Chronic obstructive pulmonary disease, unspecified: Secondary | ICD-10-CM | POA: Diagnosis not present

## 2020-05-14 DIAGNOSIS — G894 Chronic pain syndrome: Secondary | ICD-10-CM | POA: Diagnosis not present

## 2020-05-29 ENCOUNTER — Ambulatory Visit
Payer: Medicare HMO | Attending: Student in an Organized Health Care Education/Training Program | Admitting: Student in an Organized Health Care Education/Training Program

## 2020-05-29 ENCOUNTER — Encounter: Payer: Self-pay | Admitting: Student in an Organized Health Care Education/Training Program

## 2020-05-29 ENCOUNTER — Other Ambulatory Visit: Payer: Self-pay

## 2020-05-29 VITALS — BP 113/68 | HR 102 | Temp 96.9°F | Resp 14 | Ht 70.0 in | Wt 200.0 lb

## 2020-05-29 DIAGNOSIS — G894 Chronic pain syndrome: Secondary | ICD-10-CM | POA: Diagnosis not present

## 2020-05-29 DIAGNOSIS — M5416 Radiculopathy, lumbar region: Secondary | ICD-10-CM | POA: Diagnosis not present

## 2020-05-29 DIAGNOSIS — M961 Postlaminectomy syndrome, not elsewhere classified: Secondary | ICD-10-CM

## 2020-05-29 DIAGNOSIS — G8929 Other chronic pain: Secondary | ICD-10-CM | POA: Diagnosis not present

## 2020-05-29 NOTE — Progress Notes (Signed)
Safety precautions to be maintained throughout the outpatient stay will include: orient to surroundings, keep bed in low position, maintain call bell within reach at all times, provide assistance with transfer out of bed and ambulation.  

## 2020-05-29 NOTE — Progress Notes (Signed)
PROVIDER NOTE: Information contained herein reflects review and annotations entered in association with encounter. Interpretation of such information and data should be left to medically-trained personnel. Information provided to patient can be located elsewhere in the medical record under "Patient Instructions". Document created using STT-dictation technology, any transcriptional errors that may result from process are unintentional.    Patient: Jose Carroll  Service Category: E/M  Provider: Gillis Santa, MD  DOB: [birth year]  DOS: 05/29/2020  Specialty: Interventional Pain Management  MRN: [id number]  Setting: Ambulatory outpatient  PCP: Danelle Berry, NP  Type: Established Patient    Referring Provider: Danelle Berry, NP  Location: Office  Delivery: Face-to-face     HPI  Jose Carroll, a 55 y.o. year old male, is here today because of his Failed back surgical syndrome [M96.1]. Jose Carroll primary complain today is Back Pain (lower) Last encounter: My last encounter with him was on 03/20/2020. Pertinent problems: Jose Carroll has S/P lumbar spinal fusion; DDD (degenerative disc disease), lumbar; Status post lumbar surgery; Chronic radicular lumbar pain; Spinal stenosis of lumbar region; Facet syndrome, lumbar; Sacroiliac joint dysfunction; Chronic pain syndrome (sees Dr Humphrey Carroll for MM); and Failed back surgical syndrome on their pertinent problem list. Pain Assessment: Severity of Chronic pain is reported as a 6 /10. Location: Back Lower/down legs bilat to tops of feet. Onset: More than a month ago. Quality: Nagging. Timing: Constant. Modifying factor(s): meds. Vitals:  height is _0  (1.778 m) and weight is 200 lb (90.7 kg). His temporal temperature is 96.9 F (36.1 C) (abnormal). His blood pressure is 113/68 and his pulse is 102 (abnormal). His respiration is 14 and oxygen saturation is 99%.   Reason for encounter: follow-up evaluation    Patient has completed his thoracic,  lumbar MRI as well as psychological evaluation for spinal cord stimulator trial planning/implant.  He does not have any evidence of thoracic canal stenosis that would preclude a safe percutaneous trial.  His interlaminar windows appear appropriate. He has failed back surgical syndrome status post lumbar spinal fusion.  He has persistent bilateral leg pain that radiates down to the top of his feet.  He has tried physical therapy which did not help.  He is tried various spinal injections including lumbar ESI's which were not helpful long-term.  Risks and benefits of spinal cord stimulator trial have been discussed in great detail and patient would like to move forward. ROS  Constitutional: Denies any fever or chills Gastrointestinal: No reported hemesis, hematochezia, vomiting, or acute GI distress Musculoskeletal: Low back pain with radiation into bilateral lower extremity Neurological: No reported episodes of acute onset apraxia, aphasia, dysarthria, agnosia, amnesia, paralysis, loss of coordination, or loss of consciousness  Medication Review  Dulaglutide, albuterol, amoxicillin, aspirin EC, atorvastatin, cetirizine, empagliflozin, gabapentin, glipiZIDE, icosapent Ethyl, insulin degludec, ipratropium-albuterol, isosorbide mononitrate, nitroGLYCERIN, oxyCODONE ER, pantoprazole, ranolazine, tamsulosin, tiZANidine, ticagrelor, and umeclidinium bromide  History Review  Allergy: Jose Carroll is allergic to naproxen and penicillins. Drug: Jose Carroll  reports no history of drug use. Alcohol:  reports no history of alcohol use. Tobacco:  reports that he quit smoking about 4 years ago. His smoking use included cigarettes. He has a 25.00 pack-year smoking history. He quit smokeless tobacco use about 4 years ago. Social: Jose Carroll  reports that he quit smoking about 4 years ago. His smoking use included cigarettes. He has a 25.00 pack-year smoking history. He quit smokeless tobacco use about 4 years ago.  He reports that he does  not drink alcohol and does not use drugs. Medical:  has a past medical history of Anginal pain ([place]), Arthritis, COPD (chronic obstructive pulmonary disease) ([place]), Coronary artery disease, Diabetes mellitus without complication ([place]), GERD (gastroesophageal reflux disease), Hypertension, and Shortness of breath. Surgical: Jose Carroll  has a past surgical history that includes Back surgery; Finger surgery; Hernia repair; Cardiac catheterization (N/A, 09/10/2014); Fetal surgery for congenital hernia (2012); Cardiac catheterization (Left, 06/26/2015); Subclavian artery stent; LEFT HEART CATH AND CORONARY ANGIOGRAPHY (Left, 10/28/2017); and CORONARY STENT INTERVENTION (N/A, 10/28/2017). Family: family history includes Anuerysm in his father; Cancer in his mother; Diabetes in his mother.  Laboratory Chemistry Profile   Renal Lab Results  Component Value Date   BUN 5 (L) 04/22/2020   CREATININE 1.24 04/22/2020   GFRAA >60 10/29/2017   GFRNONAA >60 04/22/2020     Hepatic Lab Results  Component Value Date   AST 27 04/29/2015   ALT 22 04/29/2015   ALBUMIN 4.1 04/29/2015   ALKPHOS 45 04/29/2015   HCVAB NON REACTIVE 03/12/2019     Electrolytes Lab Results  Component Value Date   NA 140 10/29/2017   K 4.3 10/29/2017   CL 110 10/29/2017   CALCIUM 9.1 10/29/2017     Bone No results found for: VD25OH, VD125OH2TOT, OB0962EZ6, OQ9476LY6, 25OHVITD1, 25OHVITD2, 25OHVITD3, TESTOFREE, TESTOSTERONE   Inflammation (CRP: Acute Phase) (ESR: Chronic Phase) No results found for: CRP, ESRSEDRATE, LATICACIDVEN     Note: Above Lab results reviewed.  Recent Imaging Review  MR THORACIC SPINE W WO CONTRAST CLINICAL DATA:  Initial evaluation for mid back pain, spinal cord stimulator trial/implant planning.  EXAM: MRI THORACIC WITHOUT AND WITH CONTRAST  TECHNIQUE: Multiplanar and multiecho pulse sequences of the thoracic spine were obtained without and with intravenous  contrast.  CONTRAST:  34m GADAVIST GADOBUTROL 1 MMOL/ML IV SOLN  COMPARISON:  None available.  FINDINGS: Alignment: Vertebral bodies normally aligned with preservation of the normal thoracic kyphosis. No listhesis or static subluxation.  Vertebrae: Vertebral body height well maintained without acute or chronic fracture. Multiple scattered chronic endplate Schmorl's node deformities noted throughout the mid and lower thoracic spine. Underlying bone marrow signal intensity heterogeneous but overall within normal limits. Subcentimeter benign hemangioma noted within the T7 vertebral body. No other discrete or worrisome osseous lesions. Minimal discogenic reactive endplate changes noted about the anterior aspects of the T3-4 and T5-6 interspaces. No other abnormal marrow edema or enhancement.  Cord: Normal signal and morphology. Conus terminates at approximately the T12 level.  Paraspinal and other soft tissues: Paraspinous soft tissues within normal limits. Partially visualized lungs are clear. Visualized visceral structures are normal.  Disc levels:  T1-2: Normal interspace. Mild ligamentum flavum hypertrophy. No canal or foraminal stenosis.  T2-3: Unremarkable.  T3-4: Minimal reactive endplate change without significant disc bulge. No canal or foraminal stenosis.  T4-5: Negative interspace. Mild posterior element hypertrophy. No canal or foraminal stenosis.  T5-6: Small chronic endplate Schmorl's node deformities with minimal reactive endplate change. No significant disc bulge. No canal or foraminal stenosis.  T6-7:  Unremarkable.  T7-8:  Unremarkable.  T8-9: Minimal disc bulge with posterior element hypertrophy. Disc bulge slightly asymmetric to the left. No significant spinal stenosis. Foramina remain patent.  T9-10: Negative interspace. Posterior element hypertrophy, greater on the right. No significant spinal stenosis. Foramina remain patent.  T10-11: Minimal  annular disc bulge with posterior element hypertrophy. Mild spinal stenosis without cord impingement, largely due to posterior element hypertrophy. Foramina remain patent.  T11-12: Minimal annular disc bulge.  Mild facet hypertrophy. No canal or foraminal stenosis.  T12-L1:  Unremarkable.  IMPRESSION: 1. Mild degenerative disc bulging with posterior element hypertrophy at T10-11 with resultant mild spinal stenosis. 2. Additional mild for age multilevel degenerative disc disease and facet hypertrophy as above. No other significant stenosis or neural impingement within the thoracic spine.  Electronically Signed   By: Jeannine Boga M.D.   On: 04/23/2020 20:54 MR LUMBAR SPINE W WO CONTRAST CLINICAL DATA:  Osteoarthritis.  EXAM: MRI LUMBAR SPINE WITHOUT AND WITH CONTRAST  TECHNIQUE: Multiplanar and multiecho pulse sequences of the lumbar spine were obtained without and with intravenous contrast.  CONTRAST:  41m GADAVIST GADOBUTROL 1 MMOL/ML IV SOLN  COMPARISON:  MRI lumbar spine May 07, 2015.  FINDINGS: Segmentation: Standard segmentation is assumed. The inferior-most fully formed intervertebral disc is labeled L5-S1.  Alignment:  No substantial sagittal subluxation.  Vertebrae: L3 S1 PLIF. Vertebral body heights are maintained. No specific evidence of acute fracture, discitis/osteomyelitis, or suspicious bone lesion. No evidence of abnormal enhancement, although metallic artifact from posterior fusion limits evaluation.  Conus medullaris and cauda equina: Conus extends to the T12 level. Conus appears normal. No evidence of abnormal enhancement.  Paraspinal and other soft tissues: Similar chronic postoperative fluid collection posterior to the laminectomy bed, centered at L4-L5. bilateral renal cyst. A left renal cyst demonstrates layering T2 hypointensity, suggestive of hemorrhage.  Disc levels:  T12-L1: No significant disc protrusion, foraminal stenosis, or  canal stenosis.  L1-L2: Mild disc bulging and bilateral facet hypertrophy without significant canal or foraminal stenosis.  L2-L3: Mild disc bulging with superimposed left foraminal disc protrusion. Moderate bilateral facet hypertrophy. No significant canal or foraminal stenosis.  L3-L4: Posterior fusion and laminectomy. No significant canal or foraminal stenosis.  L4-L5: Posterior fusion and laminectomy. No significant canal or foraminal stenosis.  L5-S1: Posterior fusion and laminectomy. No significant canal or foraminal stenosis.  IMPRESSION: 1. Posterior laminectomy and fusion spanning L3-S1 and multilevel degenerative change without evidence of significant canal or foraminal stenosis, not substantially changed from the prior. 2. Bilateral renal cysts, including suspected complex/hemorrhagic cyst on the left. Recommend renal ultrasound to further characterize.  Electronically Signed   By: FMargaretha SheffieldMD   On: 04/23/2020 19:13 Note: Reviewed          Physical Exam  General appearance: Well nourished, well developed, and well hydrated. In no apparent acute distress Mental status: Alert, oriented x 3 (person, place, & time)       Respiratory: No evidence of acute respiratory distress Eyes: PERLA Vitals: BP 113/68   Pulse (!) 102   Temp (!) 96.9 F (36.1 C) (Temporal)   Resp 14   Ht _0  (1.778 m)   Wt 200 lb (90.7 kg)   SpO2 99%   BMI 28.70 kg/m  BMI: Estimated body mass index is 28.7 kg/m as calculated from the following:   Height as of this encounter: _1  (1.778 m).   Weight as of this encounter: 200 lb (90.7 kg). Ideal: Ideal body weight: 73 kg (160 lb 15 oz) Adjusted ideal body weight: 80.1 kg (176 lb 9 oz)   Thoracic Spine Area Exam  Skin & Axial Inspection: No masses, redness, or swelling Alignment: Symmetrical Functional ROM: Unrestricted ROM Stability: No instability detected Muscle Tone/Strength: Functionally intact. No obvious  neuro-muscular anomalies detected. Sensory (Neurological): Neurogenic pain pattern  Lumbar Exam  Skin & Axial Inspection: Well healed scar from previous spine surgery detected Alignment: Symmetrical Functional ROM: Pain restricted ROM affecting  both sides Stability: No instability detected Muscle Tone/Strength: Functionally intact. No obvious neuro-muscular anomalies detected. Sensory (Neurological): Dermatomal pain pattern right greater than left Palpation: No palpable anomalies       Provocative Tests: Hyperextension/rotation test: Unable to perform due to fusion restriction. Lumbar quadrant test (Kemp's test): (+) bilateral for foraminal stenosis Lateral bending test: (+) ipsilateral radicular pain, bilaterally. Positive for bilateral foraminal stenosis.   Gait & Posture Assessment  Ambulation: Limited Gait: Limited. Using assistive device to ambulate Posture: Difficulty standing up straight, due to pain   Lower Extremity Exam    Side: Right lower extremity  Side: Left lower extremity  Stability: No instability observed          Stability: No instability observed          Skin & Extremity Inspection: Skin color, temperature, and hair growth are WNL. No peripheral edema or cyanosis. No masses, redness, swelling, asymmetry, or associated skin lesions. No contractures.  Skin & Extremity Inspection: Skin color, temperature, and hair growth are WNL. No peripheral edema or cyanosis. No masses, redness, swelling, asymmetry, or associated skin lesions. No contractures.  Functional ROM: Pain restricted ROM for hip and knee joints          Functional ROM: Pain restricted ROM for hip and knee joints          Muscle Tone/Strength: Functionally intact. No obvious neuro-muscular anomalies detected.  Muscle Tone/Strength: Functionally intact. No obvious neuro-muscular anomalies detected.  Sensory (Neurological): Dermatomal pain pattern        Sensory (Neurological): Dermatomal pain  pattern        DTR: Patellar: deferred today Achilles: deferred today Plantar: deferred today  DTR: Patellar: deferred today Achilles: deferred today Plantar: deferred today  Palpation: No palpable anomalies  Palpation: No palpable anomalies     Assessment   Status Diagnosis  Persistent Persistent Persistent 1. Failed back surgical syndrome   2. Lumbar postlaminectomy syndrome   3. Chronic radicular lumbar pain (right greater than left)   4. Chronic pain syndrome (sees Dr Humphrey Carroll for MM)      Updated Problems: Problem  Chronic pain syndrome (sees Dr Humphrey Carroll for MM)  Failed Back Surgical Syndrome  Ddd (Degenerative Disc Disease), Lumbar  Status Post Lumbar Surgery  Chronic Radicular Lumbar Pain  Spinal Stenosis of Lumbar Region  Facet Syndrome, Lumbar  Sacroiliac Joint Dysfunction  S/P Lumbar Spinal Fusion    Plan of Care   Mr. TARICK PARENTEAU has a current medication list which includes the following long-term medication(s): albuterol, cetirizine, glipizide, ipratropium-albuterol, and nitroglycerin.   MEDTRONIC SCS TRIAL for failed back surgical syndrome  Will check platelets given history of thrombocytopenia  Patient has discussed plan for Brilinta.  He will stop 5 days prior to his scheduled trial and remain off of his Brilinta during SCS trial duration  Provided patient with antimicrobial cleansing solution to apply to back night prior to procedure  Risks and benefits discussed in great detail regarding spinal cord stimulator trial/implant and patient wants to proceed.  Orders:  Orders Placed This Encounter  Procedures  . [facility] representative to notify them of the scheduled case and to make sure they will be available to provide required equipment.    Standing Status:   Future    Standing Expiration Date:   11/29/2020    Scheduling Instructions:     Side: Bilateral     Level: Lumbar     Device: Medtronic  Sedation: With sedation     Timeframe: As soon as pre-approved    Order Specific Question:   Where will this procedure be performed?    Answer:   [facility]  . CBC    Order Specific Question:   CC Results    Answer:   PCP-NURSE [[id number]]    Order Specific Question:   Release to patient    Answer:   Immediate   Follow-up plan:   Return in about 2 weeks (around 06/12/2020) for Medtronic SCS Trial (on Brillinta).   Recent Visits Date Type Provider Dept  03/20/20 Office Visit Gillis Santa, MD [facility]  Showing recent visits within past 90 days and meeting all other requirements Today's Visits Date Type Provider Dept  05/29/20 Office Visit Gillis Santa, MD [facility]  Showing today's visits and meeting all other requirements Future Appointments No visits were found meeting these conditions. Showing future appointments within next 90 days and meeting all other requirements  I discussed the assessment and treatment plan with the patient. The patient was provided an opportunity to ask questions and all were answered. The patient agreed with the plan and demonstrated an understanding of the instructions.  Patient advised to call back or seek an in-person evaluation if the symptoms or condition worsens.  Duration of encounter: 30 minutes.  Note by: Gillis Santa, MD Date: 05/29/2020; Time: 2:03 PM

## 2020-05-29 NOTE — Patient Instructions (Addendum)
Pt. Given [employer] SCS trial pamphlet and dyna-hex soap/with instructions for use.  GENERAL RISKS AND COMPLICATIONS  What are the risk, side effects and possible complications? Generally speaking, most procedures are safe.  However, with any procedure there are risks, side effects, and the possibility of complications.  The risks and complications are dependent upon the sites that are lesioned, or the type of nerve block to be performed.  The closer the procedure is to the spine, the more serious the risks are.  Great care is taken when placing the radio frequency needles, block needles or lesioning probes, but sometimes complications can occur. 1. Infection: Any time there is an injection through the skin, there is a risk of infection.  This is why sterile conditions are used for these blocks.  There are four possible types of infection. 1. Localized skin infection. 2. Central Nervous System Infection-This can be in the form of Meningitis, which can be deadly. 3. Epidural Infections-This can be in the form of an epidural abscess, which can cause pressure inside of the spine, causing compression of the spinal cord with subsequent paralysis. This would require an emergency surgery to decompress, and there are no guarantees that the patient would recover from the paralysis. 4. Discitis-This is an infection of the intervertebral discs.  It occurs in about 1% of discography procedures.  It is difficult to treat and it may lead to surgery.        2. Pain: the needles have to go through skin and soft tissues, will cause soreness.       3. Damage to internal structures:  The nerves to be lesioned may be near blood vessels or    other nerves which can be potentially damaged.       4. Bleeding: Bleeding is more common if the patient is taking blood thinners such as  aspirin, Coumadin, Ticiid, Plavix, etc., or if he/she have some genetic predisposition  such as hemophilia. Bleeding into the spinal canal can  cause compression of the spinal  cord with subsequent paralysis.  This would require an emergency surgery to  decompress and there are no guarantees that the patient would recover from the  paralysis.       5. Pneumothorax:  Puncturing of a lung is a possibility, every time a needle is introduced in  the area of the chest or upper back.  Pneumothorax refers to free air around the  collapsed lung(s), inside of the thoracic cavity (chest cavity).  Another two possible  complications related to a similar event would include: Hemothorax and Chylothorax.   These are variations of the Pneumothorax, where instead of air around the collapsed  lung(s), you may have blood or chyle, respectively.       6. Spinal headaches: They may occur with any procedures in the area of the spine.       7. Persistent CSF (Cerebro-Spinal Fluid) leakage: This is a rare problem, but may occur  with prolonged intrathecal or epidural catheters either due to the formation of a fistulous  track or a dural tear.       8. Nerve damage: By working so close to the spinal cord, there is always a possibility of  nerve damage, which could be as serious as a permanent spinal cord injury with  paralysis.       9. Death:  Although rare, severe deadly allergic reactions known as "Anaphylactic  reaction" can occur to any of the medications used.  10. Worsening of the symptoms:  We can always make thing worse.  What are the chances of something like this happening? Chances of any of this occuring are extremely low.  By statistics, you have more of a chance of getting killed in a motor vehicle accident: while driving to the hospital than any of the above occurring .  Nevertheless, you should be aware that they are possibilities.  In general, it is similar to taking a shower.  Everybody knows that you can slip, hit your head and get killed.  Does that mean that you should not shower again?  Nevertheless always keep in mind that statistics do not mean  anything if you happen to be on the wrong side of them.  Even if a procedure has a 1 (one) in a 1,000,000 (million) chance of going wrong, it you happen to be that one..Also, keep in mind that by statistics, you have more of a chance of having something go wrong when taking medications.  Who should not have this procedure? If you are on a blood thinning medication (e.g. Coumadin, Plavix, see list of "Blood Thinners"), or if you have an active infection going on, you should not have the procedure.  If you are taking any blood thinners, please inform your physician.  How should I prepare for this procedure?  Do not eat or drink anything at least six hours prior to the procedure.  Bring a driver with you .  It cannot be a taxi.  Come accompanied by an adult that can drive you back, and that is strong enough to help you if your legs get weak or numb from the local anesthetic.  Take all of your medicines the morning of the procedure with just enough water to swallow them.  If you have diabetes, make sure that you are scheduled to have your procedure done first thing in the morning, whenever possible.  If you have diabetes, take only half of your insulin dose and notify our nurse that you have done so as soon as you arrive at the clinic.  If you are diabetic, but only take blood sugar pills (oral hypoglycemic), then do not take them on the morning of your procedure.  You may take them after you have had the procedure.  Do not take aspirin or any aspirin-containing medications, at least eleven (11) days prior to the procedure.  They may prolong bleeding.  Wear loose fitting clothing that may be easy to take off and that you would not mind if it got stained with Betadine or blood.  Do not wear any jewelry or perfume  Remove any nail coloring.  It will interfere with some of our monitoring equipment.  NOTE: Remember that this is not meant to be interpreted as a complete list of all possible  complications.  Unforeseen problems may occur.  BLOOD THINNERS The following drugs contain aspirin or other products, which can cause increased bleeding during surgery and should not be taken for 2 weeks prior to and 1 week after surgery.  If you should need take something for relief of minor pain, you may take acetaminophen which is found in Tylenol,m Datril, Anacin-3 and Panadol. It is not blood thinner. The products listed below are.  Do not take any of the products listed below in addition to any listed on your instruction sheet.  A.P.C or A.P.C with Codeine Codeine Phosphate Capsules #3 Ibuprofen Ridaura  ABC compound Congesprin Imuran rimadil  Advil Cope Indocin Robaxisal  Alka-Seltzer Effervescent Pain Reliever and Antacid Coricidin or Coricidin-D  Indomethacin Rufen  Alka-Seltzer plus Cold Medicine Cosprin Ketoprofen S-A-C Tablets  Anacin Analgesic Tablets or Capsules Coumadin Korlgesic Salflex  Anacin Extra Strength Analgesic tablets or capsules CP-2 Tablets Lanoril Salicylate  Anaprox Cuprimine Capsules Levenox Salocol  Anexsia-D Dalteparin Magan Salsalate  Anodynos Darvon compound Magnesium Salicylate Sine-off  Ansaid Dasin Capsules Magsal Sodium Salicylate  Anturane Depen Capsules Marnal Soma  APF Arthritis pain formula Dewitt's Pills Measurin Stanback  Argesic Dia-Gesic Meclofenamic Sulfinpyrazone  Arthritis Bayer Timed Release Aspirin Diclofenac Meclomen Sulindac  Arthritis pain formula Anacin Dicumarol Medipren Supac  Analgesic (Safety coated) Arthralgen Diffunasal Mefanamic Suprofen  Arthritis Strength Bufferin Dihydrocodeine Mepro Compound Suprol  Arthropan liquid Dopirydamole Methcarbomol with Aspirin Synalgos  ASA tablets/Enseals Disalcid Micrainin Tagament  Ascriptin Doan's Midol Talwin  Ascriptin A/D Dolene Mobidin Tanderil  Ascriptin Extra Strength Dolobid Moblgesic Ticlid  Ascriptin with Codeine Doloprin or Doloprin with Codeine Momentum Tolectin  Asperbuf Duoprin  Mono-gesic Trendar  Aspergum Duradyne Motrin or Motrin IB Triminicin  Aspirin plain, buffered or enteric coated Durasal Myochrisine Trigesic  Aspirin Suppositories Easprin Nalfon Trillsate  Aspirin with Codeine Ecotrin Regular or Extra Strength Naprosyn Uracel  Atromid-S Efficin Naproxen Ursinus  Auranofin Capsules Elmiron Neocylate Vanquish  Axotal Emagrin Norgesic Verin  Azathioprine Empirin or Empirin with Codeine Normiflo Vitamin E  Azolid Emprazil Nuprin Voltaren  Bayer Aspirin plain, buffered or children's or timed BC Tablets or powders Encaprin Orgaran Warfarin Sodium  Buff-a-Comp Enoxaparin Orudis Zorpin  Buff-a-Comp with Codeine Equegesic Os-Cal-Gesic   Buffaprin Excedrin plain, buffered or Extra Strength Oxalid   Bufferin Arthritis Strength Feldene Oxphenbutazone   Bufferin plain or Extra Strength Feldene Capsules Oxycodone with Aspirin   Bufferin with Codeine Fenoprofen Fenoprofen Pabalate or Pabalate-SF   Buffets II Flogesic Panagesic   Buffinol plain or Extra Strength Florinal or Florinal with Codeine Panwarfarin   Buf-Tabs Flurbiprofen Penicillamine   Butalbital Compound Four-way cold tablets Penicillin   Butazolidin Fragmin Pepto-Bismol   Carbenicillin Geminisyn Percodan   Carna Arthritis Reliever Geopen Persantine   Carprofen Gold's salt Persistin   Chloramphenicol Goody's Phenylbutazone   Chloromycetin Haltrain Piroxlcam   Clmetidine heparin Plaquenil   Cllnoril Hyco-pap Ponstel   Clofibrate Hydroxy chloroquine Propoxyphen         Before stopping any of these medications, be sure to consult the physician who ordered them.  Some, such as Coumadin (Warfarin) are ordered to prevent or treat serious conditions such as "deep thrombosis", "pumonary embolisms", and other heart problems.  The amount of time that you may need off of the medication may also vary with the medication and the reason for which you were taking it.  If you are taking any of these medications, please  make sure you notify your pain physician before you undergo any procedures.         Moderate Conscious Sedation, Adult Sedation is the use of medicines to promote relaxation and to relieve discomfort and anxiety. Moderate conscious sedation is a type of sedation. Under moderate conscious sedation, you are less alert than normal, but you are still able to respond to instructions, touch, or both. Moderate conscious sedation is used during short medical and dental procedures. It is milder than deep sedation, which is a type of sedation under which you cannot be easily woken up. It is also milder than general anesthesia, which is the use of medicines to make you unconscious. Moderate conscious sedation allows you to return to your regular activities sooner. Tell a  health care provider about:  Any allergies you have.  All medicines you are taking, including vitamins, herbs, eye drops, creams, and over-the-counter medicines.  Any use of steroids. This includes steroids taken by mouth or as a cream.  Any problems you or family members have had with sedatives and anesthetic medicines.  Any blood disorders you have.  Any surgeries you have had.  Any medical conditions you have, such as sleep apnea.  Whether you are pregnant or may be pregnant.  Any use of cigarettes, alcohol, marijuana, or drugs. What are the risks? Generally, this is a safe procedure. However, problems may occur, including:  Getting too much medicine (oversedation).  Nausea.  Allergic reaction to medicines.  Trouble breathing. If this happens, a breathing tube may be used. It will be removed when you are awake and breathing on your own.  Heart trouble.  Lung trouble.  Confusion that gets better with time (emergence delirium). What happens before the procedure? Staying hydrated Follow instructions from your health care provider about hydration, which may include:  Up to 2 hours before the procedure - you may  continue to drink clear liquids, such as water, clear fruit juice, black coffee, and plain tea. Eating and drinking restrictions Follow instructions from your health care provider about eating and drinking, which may include:  8 hours before the procedure - stop eating heavy meals or foods, such as meat, fried foods, or fatty foods.  6 hours before the procedure - stop eating light meals or foods, such as toast or cereal.  6 hours before the procedure - stop drinking milk or drinks that contain milk.  2 hours before the procedure - stop drinking clear liquids. Medicines Ask your health care provider about:  Changing or stopping your regular medicines. This is especially important if you are taking diabetes medicines or blood thinners.  Taking medicines such as aspirin and ibuprofen. These medicines can thin your blood. Do not take these medicines unless your health care provider tells you to take them.  Taking over-the-counter medicines, vitamins, herbs, and supplements. Tests and exams  You will have a physical exam.  You may have blood tests done to show how well: ? Your kidneys and liver work. ? Your blood clots. General instructions  Plan to have a responsible adult take you home from the hospital or clinic.  If you will be going home right after the procedure, plan to have a responsible adult care for you for the time you are told. This is important. What happens during the procedure?  You will be given the sedative. The sedative may be given: ? As a pill that you will swallow. It can also be inserted into the rectum. ? As a spray through the nose. ? As an injection into the muscle. ? As an injection into the vein through an IV.  You may be given oxygen as needed.  Your breathing, heart rate, and blood pressure will be monitored during the procedure.  The medical or dental procedure will be done. The procedure may vary among health care providers and hospitals.    What happens after the procedure?  Your blood pressure, heart rate, breathing rate, and blood oxygen level will be monitored until you leave the hospital or clinic.  You will get fluids through your IV if needed.  Do not drive or operate machinery until your health care provider says that it is safe. Summary  Sedation is the use of medicines to promote relaxation and to  relieve discomfort and anxiety. Moderate conscious sedation is a type of sedation that is used during short medical and dental procedures.  Tell the health care provider about any medical conditions that you have and about all the medicines that you are taking.  You will be given the sedative as a pill, a spray through the nose, an injection into the muscle, or an injection into the vein through an IV. Vital signs are monitored during the sedation.  Moderate conscious sedation allows you to return to your regular activities sooner. This information is not intended to replace advice given to you by your health care provider. Make sure you discuss any questions you have with your health care provider. Document Revised: 04/27/2019 Document Reviewed: 11/23/2018 Elsevier Patient Education  [place]. Spinal Cord Stimulation Trial Information A spinal cord stimulation trial is a test to see whether a spinal cord stimulator reduces your pain. A spinal cord stimulator is a small device that is inserted (implanted) in your back. The stimulator has small wires (leads) that connect it to your spinal cord. The stimulator sends electrical pulses through the leads to the spinal cord. This can relieve pain. Settings for the stimulator can be adjusted with a remote device to find the best pain control. Your health care provider may suggest a spinal cord stimulation trial if other treatments for long-lasting (chronic) pain have not worked for you. Spinal cord stimulation may be used to manage pain that is caused by:  Coronary artery  disease or peripheral vascular disease.  Failed back surgery.  Phantom limb sensation.  Peripheral neuropathy.  Complex regional pain syndrome.  Other syndromes that involve chronic pain. For the trial, the stimulator is attached to your back instead of inserted under the skin. A trial period is usually 3-5 days, but this can vary among health care providers. After your trial period, you and your health care provider will discuss whether a permanent spinal cord stimulator is an option for you. The permanent stimulator may be an option depending on:  Whether the stimulator reduces your pain during the trial.  Whether the stimulator fits into your lifestyle.  Whether the cost of the stimulator is covered by your insurance. What are the risks? Generally, a spinal cord stimulation trial is safe. However, problems can occur, including:  Bleeding or pain at the insertion site of the leads.  Infection at the insertion site or around the leads.  Allergic reactions to medicines, devices, or dyes.  Damage to the skin, nerves, back muscles, or spinal cord where the leads are placed.  Inability to move the legs (paralysis).  Numbness in the legs.  Inability to control when you urinate or have a bowel movement (incontinence).  Spinal fluid leakage. How is a spinal cord stimulator placed for a trial? For a trial period, the stimulator is placed on your skin, not under it. Only the leads that connect the stimulator to the spinal cord are implanted under your skin. The exact location of the stimulator depends on where you have pain. There are two types of surgery for implanting the leads:  Noninvasive surgery. In this type of surgery, a small incision is made and needles are used to place the leads under your skin.  Open surgery. In this type of surgery, a larger incision is made, and the leads are implanted directly into your back.   How should I care for myself during the trial  period? Activity  Return to your normal activities as told  by your health care provider. Ask your health care provider what activities are safe for you.  Do not lift anything that is heavier than 10 lb (4.5 kg), or the limit that you are told. General instructions  Follow your health care provider's specific instructions about how to take care of your spinal cord stimulator and your incision.  Make sure you write down the following information so that you can share this information with your health care provider: ? Your responses to the stimulator. Describe these as told by your health care provider. ? Your pain level throughout the day. ? The amount and kind of pain medicine that you take.  Take over-the-counter and prescription medicines only as told by your health care provider.  Do not take baths, swim, or use a hot tub until your health care provider approves.  Tell all health care providers who provide care for you that you have a spinal cord stimulator. This is important information that could affect the medical treatment that you receive.  Keep all follow-up visits as told by your health care provider. This is important. When should I seek medical care? During the trial, seek medical care if:  You have redness, swelling, or pain around your incision.  You have fluid or blood coming from your incision.  Your incision feels warm to the touch.  You have pus or a bad smell coming from your incision.  The bandage (dressing) that covers your incision comes off. Get help right away if:  Your pain gets worse.  The stimulator leads come out.  You develop numbness or weakness in your legs, or you have difficulty walking.  You have problems urinating or having a bowel movement.  You have a fever.  You have symptoms that last for more than 2-3 days.  Your symptoms suddenly get worse. Summary  A spinal cord stimulator is a small device that sends electrical pulses to  your spinal cord. This can relieve pain caused by many different health conditions.  Before a permanent stimulator is placed, you will have a trial using a temporary stimulator that is not implanted under your skin. This helps determine if a stimulator will reduce your pain.  For the trial, only the leads that connect the stimulator to the spinal cord are implanted under your skin.  During the trial period, make sure you write down information about your pain and your responses to the stimulator so that you can share this information with your health care provider.  Keep all follow-up visits as told by your health care provider. This is important. Contact your health care provider if you have symptoms that indicate a problem. This information is not intended to replace advice given to you by your health care provider. Make sure you discuss any questions you have with your health care provider. Document Revised: 09/22/2018 Document Reviewed: 02/01/2018 Elsevier Patient Education  2021 [employer].

## 2020-05-30 LAB — CBC
Hematocrit: 48.1 % (ref 37.5–51.0)
Hemoglobin: 16.3 g/dL (ref 13.0–17.7)
MCH: 30.2 pg (ref 26.6–33.0)
MCHC: 33.9 g/dL (ref 31.5–35.7)
MCV: 89 fL (ref 79–97)
Platelets: 140 10*3/uL — ABNORMAL LOW (ref 150–450)
RBC: 5.39 x10E6/uL (ref 4.14–5.80)
RDW: 13.7 % (ref 11.6–15.4)
WBC: 8.5 10*3/uL (ref 3.4–10.8)

## 2020-06-11 DIAGNOSIS — I10 Essential (primary) hypertension: Secondary | ICD-10-CM | POA: Diagnosis not present

## 2020-06-11 DIAGNOSIS — M5440 Lumbago with sciatica, unspecified side: Secondary | ICD-10-CM | POA: Diagnosis not present

## 2020-06-11 DIAGNOSIS — M961 Postlaminectomy syndrome, not elsewhere classified: Secondary | ICD-10-CM | POA: Diagnosis not present

## 2020-06-11 DIAGNOSIS — G894 Chronic pain syndrome: Secondary | ICD-10-CM | POA: Diagnosis not present

## 2020-06-11 DIAGNOSIS — G8928 Other chronic postprocedural pain: Secondary | ICD-10-CM | POA: Diagnosis not present

## 2020-06-11 DIAGNOSIS — G8929 Other chronic pain: Secondary | ICD-10-CM | POA: Diagnosis not present

## 2020-06-11 DIAGNOSIS — J449 Chronic obstructive pulmonary disease, unspecified: Secondary | ICD-10-CM | POA: Diagnosis not present

## 2020-06-11 DIAGNOSIS — M545 Low back pain, unspecified: Secondary | ICD-10-CM | POA: Diagnosis not present

## 2020-06-17 DIAGNOSIS — Z79891 Long term (current) use of opiate analgesic: Secondary | ICD-10-CM | POA: Diagnosis not present

## 2020-06-17 DIAGNOSIS — F1120 Opioid dependence, uncomplicated: Secondary | ICD-10-CM | POA: Diagnosis not present

## 2020-06-24 DIAGNOSIS — J449 Chronic obstructive pulmonary disease, unspecified: Secondary | ICD-10-CM | POA: Diagnosis not present

## 2020-06-30 DIAGNOSIS — I10 Essential (primary) hypertension: Secondary | ICD-10-CM | POA: Diagnosis not present

## 2020-06-30 DIAGNOSIS — G894 Chronic pain syndrome: Secondary | ICD-10-CM | POA: Diagnosis not present

## 2020-06-30 DIAGNOSIS — J449 Chronic obstructive pulmonary disease, unspecified: Secondary | ICD-10-CM | POA: Diagnosis not present

## 2020-06-30 DIAGNOSIS — G8929 Other chronic pain: Secondary | ICD-10-CM | POA: Diagnosis not present

## 2020-06-30 DIAGNOSIS — G8928 Other chronic postprocedural pain: Secondary | ICD-10-CM | POA: Diagnosis not present

## 2020-06-30 DIAGNOSIS — M545 Low back pain, unspecified: Secondary | ICD-10-CM | POA: Diagnosis not present

## 2020-06-30 DIAGNOSIS — M5440 Lumbago with sciatica, unspecified side: Secondary | ICD-10-CM | POA: Diagnosis not present

## 2020-06-30 DIAGNOSIS — M961 Postlaminectomy syndrome, not elsewhere classified: Secondary | ICD-10-CM | POA: Diagnosis not present

## 2020-07-02 ENCOUNTER — Other Ambulatory Visit: Payer: Self-pay | Admitting: Student in an Organized Health Care Education/Training Program

## 2020-07-02 ENCOUNTER — Ambulatory Visit
Admission: RE | Admit: 2020-07-02 | Discharge: 2020-07-02 | Disposition: A | Discharge status: Home / Self Care | Payer: Medicare HMO | Source: Ambulatory Visit | Attending: Student in an Organized Health Care Education/Training Program | Admitting: Student in an Organized Health Care Education/Training Program

## 2020-07-02 ENCOUNTER — Encounter: Payer: Self-pay | Admitting: Student in an Organized Health Care Education/Training Program

## 2020-07-02 ENCOUNTER — Other Ambulatory Visit: Payer: Self-pay

## 2020-07-02 ENCOUNTER — Ambulatory Visit (HOSPITAL_BASED_OUTPATIENT_CLINIC_OR_DEPARTMENT_OTHER): Payer: Medicare HMO | Admitting: Student in an Organized Health Care Education/Training Program

## 2020-07-02 DIAGNOSIS — R52 Pain, unspecified: Secondary | ICD-10-CM

## 2020-07-02 DIAGNOSIS — G8929 Other chronic pain: Secondary | ICD-10-CM | POA: Diagnosis not present

## 2020-07-02 DIAGNOSIS — M961 Postlaminectomy syndrome, not elsewhere classified: Secondary | ICD-10-CM | POA: Insufficient documentation

## 2020-07-02 DIAGNOSIS — M5416 Radiculopathy, lumbar region: Secondary | ICD-10-CM | POA: Insufficient documentation

## 2020-07-02 DIAGNOSIS — G894 Chronic pain syndrome: Secondary | ICD-10-CM

## 2020-07-02 MED ORDER — LIDOCAINE HCL 2 % IJ SOLN
20.0000 mL | Freq: Once | INTRAMUSCULAR | Status: AC
  Administered 2020-07-02: 400 mg

## 2020-07-02 MED ORDER — CEFAZOLIN SODIUM 1 G IJ SOLR
INTRAMUSCULAR | Status: AC
  Filled 2020-07-02: qty 20

## 2020-07-02 MED ORDER — FENTANYL CITRATE (PF) 100 MCG/2ML IJ SOLN
INTRAMUSCULAR | Status: AC
  Filled 2020-07-02: qty 2

## 2020-07-02 MED ORDER — FENTANYL CITRATE (PF) 100 MCG/2ML IJ SOLN
25.0000 ug | INTRAMUSCULAR | Status: AC | PRN
  Administered 2020-07-02: 50 ug via INTRAVENOUS
  Administered 2020-07-02 (×2): 25 ug via INTRAVENOUS

## 2020-07-02 MED ORDER — MIDAZOLAM BOLUS VIA INFUSION
1.0000 mg | INTRAVENOUS | Status: DC | PRN
  Filled 2020-07-02: qty 2

## 2020-07-02 MED ORDER — LIDOCAINE HCL 2 % IJ SOLN
INTRAMUSCULAR | Status: AC
  Filled 2020-07-02: qty 10

## 2020-07-02 MED ORDER — MIDAZOLAM HCL 5 MG/5ML IJ SOLN
1.0000 mg | INTRAMUSCULAR | Status: DC | PRN
  Administered 2020-07-02 (×2): 1 mg via INTRAVENOUS

## 2020-07-02 MED ORDER — LACTATED RINGERS IV SOLN
1000.0000 mL | Freq: Once | INTRAVENOUS | Status: AC
  Administered 2020-07-02: 1000 mL via INTRAVENOUS

## 2020-07-02 MED ORDER — CEPHALEXIN 500 MG PO CAPS: 500.0000 mg | ORAL_CAPSULE | Freq: Four times a day (QID) | ORAL | 0 refills | Status: DC

## 2020-07-02 MED ORDER — ROPIVACAINE HCL 2 MG/ML IJ SOLN
INTRAMUSCULAR | Status: AC
  Filled 2020-07-02: qty 20

## 2020-07-02 MED ORDER — ROPIVACAINE HCL 2 MG/ML IJ SOLN
20.0000 mL | Freq: Once | INTRAMUSCULAR | Status: AC
  Administered 2020-07-02: 20 mL

## 2020-07-02 MED ORDER — MIDAZOLAM HCL 5 MG/5ML IJ SOLN
INTRAMUSCULAR | Status: AC
  Filled 2020-07-02: qty 5

## 2020-07-02 MED ORDER — CEFAZOLIN SODIUM-DEXTROSE 2-4 GM/100ML-% IV SOLN
2.0000 g | Freq: Once | INTRAVENOUS | Status: AC
  Administered 2020-07-02: 2 g via INTRAVENOUS
  Filled 2020-07-02: qty 100

## 2020-07-02 NOTE — Progress Notes (Signed)
PROVIDER NOTE: Information contained herein reflects review and annotations entered in association with encounter. Interpretation of such information and data should be left to medically-trained personnel. Information provided to patient can be located elsewhere in the medical record under "Patient Instructions". Document created using STT-dictation technology, any transcriptional errors that may result from process are unintentional.    Patient: Jose Carroll  Service Category: Procedure  Provider: Gillis Santa, MD  DOB: 1965/02/23  DOS: 07/02/2020  Location: [facility]  MRN: [id number]  Setting: Ambulatory - outpatient  Referring Provider: Gillis Santa, MD  Type: Established Patient  Specialty: Interventional Pain Management  PCP: Danelle Berry, NP   Primary Reason for Admission: Surgical management of chronic pain condition.  Procedure:  Anesthesia, Analgesia, Anxiolysis:  Type: Trial Spinal Cord Neurostimulator Implant (Percutaneous, interlaminar, posterior epidural placement)  Purpose: To determine if a permanent implant may be effective in controlling some or all of Jose Carroll chronic pain symptoms.  Region: Lumbar Level: T11-12 Laterality: Bilateral   Type: Moderate (Conscious) Sedation combined with Local Anesthesia Indication(s): Analgesia and Anxiety Route: Intravenous (IV) IV Access: Secured Sedation: Meaningful verbal contact was maintained at all times during the procedure  Local Anesthetic: Lidocaine 1-2%   Indications: 1. Failed back surgical syndrome   2. Lumbar postlaminectomy syndrome   3. Chronic radicular lumbar pain (right greater than left)   4. Chronic pain syndrome (sees Dr Humphrey Carroll for MM)    Patient's last Brilinta dose was 5 days ago.  Of note, procedure was aborted as the patient was having significant paresthesias and pain in his right leg as we were advancing his right percutaneous spinal cord stimulator lead and was also moving due  to pain after giving him maximum Versed and fentanyl.  Patient stated that he did not want to continue with the procedure any further even after multiple redirections, additional local anesthetic.  I will refer the patient to Dr. Lacinda Carroll for consideration of paddle trial.  Pre-op H&P Assessment:  Jose Carroll is a 55 y.o. (year old), male patient, seen today for interventional treatment. He  has a past surgical history that includes Back surgery; Finger surgery; Hernia repair; Cardiac catheterization (N/A, 09/10/2014); Fetal surgery for congenital hernia (2012); Cardiac catheterization (Left, 06/26/2015); Subclavian artery stent; LEFT HEART CATH AND CORONARY ANGIOGRAPHY (Left, 10/28/2017); and CORONARY STENT INTERVENTION (N/A, 10/28/2017).  Initial Vital Signs:  Pulse/EKG Rate: 89ECG Heart Rate: 82 Temp: (!) 97.2 F (36.2 C) Resp: 18 BP: (!) 147/88 SpO2: 99 %  BMI: Estimated body mass index is 28.7 kg/m as calculated from the following:   Height as of this encounter: 5\' 10"  (1.778 m).   Weight as of this encounter: 200 lb (90.7 kg).  Risk Assessment: Allergies: Reviewed. He is allergic to naproxen and penicillins.  Allergy Precautions: None required Coagulopathies: Reviewed. None identified.  Blood-thinner therapy: None at this time Active Infection(s): Reviewed. None identified. Jose Carroll is afebrile  Site Confirmation: Jose Carroll was asked to confirm the procedure and laterality before marking the site, which he did. Procedure checklist: Completed Consent: Before the procedure and under the influence of no sedative(s), amnesic(s), or anxiolytics, the patient was informed of the treatment options, risks and possible complications. To fulfill our ethical and legal obligations, as recommended by the American Medical Association's Code of Ethics, I have informed the patient of my clinical impression; the nature and purpose of the treatment or procedure; the risks, benefits, and possible  complications of the intervention; the alternatives, including doing nothing;  the risk(s) and benefit(s) of the alternative treatment(s) or procedure(s); and the risk(s) and benefit(s) of doing nothing.  Jose Carroll was provided with information about the general risks and possible complications associated with most interventional procedures. These include, but are not limited to: failure to achieve desired goals, infection, bleeding, organ or nerve damage, allergic reactions, paralysis, and/or death.  In addition, he was informed of those risks and possible complications associated to this particular procedure, which include, but are not limited to: damage to the implant; failure to decrease pain; local, systemic, or serious CNS infections, intraspinal abscess with possible cord compression and paralysis, or life-threatening such as meningitis; intrathecal and/or epidural bleeding with formation of hematoma with possible spinal cord compression and permanent paralysis; organ damage; nerve injury or damage with subsequent sensory, motor, and/or autonomic system dysfunction, resulting in transient or permanent pain, numbness, and/or weakness of one or several areas of the body; allergic reactions, either minor or major life-threatening, such as anaphylactic or anaphylactoid reactions.  Furthermore, Jose Carroll was informed of those risks and complications associated with the medications. These include, but are not limited to: allergic reactions (i.e.: anaphylactic or anaphylactoid reactions); arrhythmia;  Hypotension/hypertension; cardiovascular collapse; respiratory depression and/or shortness of breath; swelling or edema; medication-induced neural toxicity; particulate matter embolism and blood vessel occlusion with resultant organ, and/or nervous system infarction and permanent paralysis.  Finally, he was informed that Medicine is not an exact science; therefore, there is also the possibility of  unforeseen or unpredictable risks and/or possible complications that may result in a catastrophic outcome. The patient indicated having understood very clearly. We have given the patient no guarantees and we have made no promises. Enough time was given to the patient to ask questions, all of which were answered to the patient's satisfaction. Mr. Kortz has indicated that he wanted to continue with the procedure. Attestation: I, the ordering provider, attest that I have discussed with the patient the benefits, risks, side-effects, alternatives, likelihood of achieving goals, and potential problems during recovery for the procedure that I have provided informed consent. Date  Time: 07/02/2020  8:00 AM  Pre-Procedure Preparation:  Monitoring: As per clinic protocol. Respiration, ETCO2, SpO2, BP, heart rate and rhythm monitor placed and checked for adequate function Safety Precautions: Patient was assessed for positional comfort and pressure points before starting the procedure. Time-out: I initiated and conducted the "Time-out" before starting the procedure, as per protocol. The patient was asked to participate by confirming the accuracy of the "Time Out" information. Verification of the correct person, site, and procedure were performed and confirmed by me, the nursing staff, and the patient. "Time-out" conducted as per Joint Commission's Universal Protocol (UP.01.01.01). Time: 0841  Description of Procedure Process:   Position: Prone Target Area: Posterior epidural space Approach: Posterior percutaneous, paramedial, interlaminar approach Area Prepped: Bilateral thoraco-lumbar Region Prepping solution: ChloraPrep (2% chlorhexidine gluconate and 70% isopropyl alcohol) Safety Precautions: Safe injection practices and needle disposal techniques used. Medications properly checked for expiration dates. SDV (single dose vial) medications used. Aspiration looking for blood return and/or CSF was conducted  prior to all injections. At no point did I inject any substances, as a needle was being advanced. No attempts were made at seeking any paresthesias.  Description of the Procedure: Availability of a responsible, adult driver, and NPO status confirmed. Informed consent was obtained after having discussed risks and possible complications. An IV was started. The patient was then taken to the fluoroscopy suite, where the patient was placed in position  for the procedure, over the fluoroscopy table. The patient was then monitored in the usual manner. Fluoroscopy was manipulated to obtain the best possible view of the target. Parallex error was corrected before commencing the procedure. Once a clear view of the target had been obtained, the skin and deeper tissues over the procedure site were infiltrated using lidocaine, loaded in a 10 cc luer-loc syringe with a 0.5 inch, 25-G needle. The introducer needle(s) was/were then inserted through the skin and deeper tissues. A paramidline approach was used to enter the posterior epidural space at a 30 angle, using "Loss-of-resistance Technique" with 3 ml of PF-NaCl (0.9% NSS). Correct needle placement was confirmed in the antero-posterior and lateral fluoroscopic views.   Patient experienced paresthesias in his right leg as well as severe back and right leg pain as we attempted to advance.  Even after multiple redirections and reviewed orientations, patient continued to have pain as we attempted to advance lead into posterior epidural space to the point that he did not want to continue with procedure.  This was after we had given him maximum dose fentanyl and Versed.  At this point we had a conversation and decided that it was best to abort the procedure and consider referral to neurosurgery for consideration of paddle trial.   Imaging Guidance (Spinal):          Type of Imaging Technique: Fluoroscopy Guidance (Spinal) Indication(s): Assistance in needle guidance and  placement for procedures requiring needle placement in or near specific anatomical locations not easily accessible without such assistance. Exposure Time: Please see nurses notes. Contrast: None used. Fluoroscopic Guidance: I was personally present during the use of fluoroscopy. "Tunnel Vision Technique" used to obtain the best possible view of the target area. Parallax error corrected before commencing the procedure. "Direction-depth-direction" technique used to introduce the needle under continuous pulsed fluoroscopy. Once target was reached, antero-posterior, oblique, and lateral fluoroscopic projection used confirm needle placement in all planes. Images permanently stored in EMR. Interpretation: No contrast injected. I personally interpreted the imaging intraoperatively. Adequate needle placement confirmed in multiple planes. Permanent images saved into the patient's record.   Post-operative Assessment:  Post-procedure Vital Signs:  Pulse/HCG Rate: 7876 Temp: (!) 97.3 F (36.3 C) Resp: 16 BP: (!) 142/90 SpO2: 100 %    Note: Patient was having increased pain in his right leg and in his back.  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        Plan of Care (discussed  Orders:   Referral to Dr. Lacinda Carroll for consideration of [employer] SCS paddle trial. Of note, patient's right leg pain and weakness was improving prior to discharge.  I instructed him to not start his Brilinta until we have touch base with him tomorrow and have confirmed that he is not having any new or worsening lower extremity weakness. On physical exam prior to discharge, 5 out of 5 strength bilateral lower extremity: Plantar flexion, dorsiflexion, knee flexion, knee extension. He was provided instructions regarding any  red flags such as worsening pain, weakness, bowel or bladder incontinency to contact us or go to the emergency department immediately.  Patient endorsed understanding.   Medications administered: We administered lactated ringers, ceFAZolin, fentaNYL, lidocaine, ropivacaine (PF) 2 mg/mL (0.2%), and midazolam.  See the medical record for exact dosing, route, and time of administration.  Follow-up plan:   Return for PRN.     Recent Visits Date Type Provider Dept  05/29/20 Office Visit Gillis Santa, MD [facility]  Showing recent visits within past 90 days and meeting all other requirements Today's Visits Date Type Provider Dept  07/02/20 Procedure visit Gillis Santa, MD [facility]  Showing today's visits and meeting all other requirements Future Appointments No visits were found meeting these conditions. Showing future appointments within next 90 days and meeting all other requirements Disposition: Discharge home  Discharge (Date  Time): 07/02/2020; 1006 hrs.   Primary Care Physician: Danelle Berry, NP Location: [facility] Note by: Gillis Santa, MD Date: 07/02/2020; Time: 10:59 AM

## 2020-07-02 NOTE — Progress Notes (Signed)
Safety precautions to be maintained throughout the outpatient stay will include: orient to surroundings, keep bed in low position, maintain call bell within reach at all times, provide assistance with transfer out of bed and ambulation.  

## 2020-07-03 ENCOUNTER — Telehealth: Payer: Self-pay | Admitting: *Deleted

## 2020-07-03 NOTE — Telephone Encounter (Signed)
Having pain in both heels since SCS trial yesterday. Dr. Holley Raring notified.

## 2020-07-03 NOTE — Telephone Encounter (Signed)
Having increased pain in both heels, increased weakness in legs. Having difficulty walking. Dr. Holley Raring notified. Patient advised to go to ED to possibly get stat MRI.

## 2020-07-15 DIAGNOSIS — E119 Type 2 diabetes mellitus without complications: Secondary | ICD-10-CM | POA: Diagnosis not present

## 2020-07-18 DIAGNOSIS — J449 Chronic obstructive pulmonary disease, unspecified: Secondary | ICD-10-CM | POA: Diagnosis not present

## 2020-08-11 DIAGNOSIS — I10 Essential (primary) hypertension: Secondary | ICD-10-CM | POA: Diagnosis not present

## 2020-08-11 DIAGNOSIS — M545 Low back pain, unspecified: Secondary | ICD-10-CM | POA: Diagnosis not present

## 2020-08-11 DIAGNOSIS — M5440 Lumbago with sciatica, unspecified side: Secondary | ICD-10-CM | POA: Diagnosis not present

## 2020-08-11 DIAGNOSIS — J449 Chronic obstructive pulmonary disease, unspecified: Secondary | ICD-10-CM | POA: Diagnosis not present

## 2020-08-11 DIAGNOSIS — G8928 Other chronic postprocedural pain: Secondary | ICD-10-CM | POA: Diagnosis not present

## 2020-08-11 DIAGNOSIS — G894 Chronic pain syndrome: Secondary | ICD-10-CM | POA: Diagnosis not present

## 2020-08-11 DIAGNOSIS — M961 Postlaminectomy syndrome, not elsewhere classified: Secondary | ICD-10-CM | POA: Diagnosis not present

## 2020-08-12 DIAGNOSIS — J449 Chronic obstructive pulmonary disease, unspecified: Secondary | ICD-10-CM | POA: Diagnosis not present

## 2020-08-12 DIAGNOSIS — Z79891 Long term (current) use of opiate analgesic: Secondary | ICD-10-CM | POA: Diagnosis not present

## 2020-08-12 DIAGNOSIS — F1120 Opioid dependence, uncomplicated: Secondary | ICD-10-CM | POA: Diagnosis not present

## 2020-09-03 DIAGNOSIS — Z79899 Other long term (current) drug therapy: Secondary | ICD-10-CM | POA: Diagnosis not present

## 2020-09-08 DIAGNOSIS — M961 Postlaminectomy syndrome, not elsewhere classified: Secondary | ICD-10-CM | POA: Diagnosis not present

## 2020-09-08 DIAGNOSIS — M545 Low back pain, unspecified: Secondary | ICD-10-CM | POA: Diagnosis not present

## 2020-09-08 DIAGNOSIS — K5903 Drug induced constipation: Secondary | ICD-10-CM | POA: Diagnosis not present

## 2020-09-08 DIAGNOSIS — G8929 Other chronic pain: Secondary | ICD-10-CM | POA: Diagnosis not present

## 2020-09-08 DIAGNOSIS — G8928 Other chronic postprocedural pain: Secondary | ICD-10-CM | POA: Diagnosis not present

## 2020-09-08 DIAGNOSIS — J449 Chronic obstructive pulmonary disease, unspecified: Secondary | ICD-10-CM | POA: Diagnosis not present

## 2020-09-08 DIAGNOSIS — I10 Essential (primary) hypertension: Secondary | ICD-10-CM | POA: Diagnosis not present

## 2020-09-08 DIAGNOSIS — G894 Chronic pain syndrome: Secondary | ICD-10-CM | POA: Diagnosis not present

## 2020-09-08 DIAGNOSIS — E0842 Diabetes mellitus due to underlying condition with diabetic polyneuropathy: Secondary | ICD-10-CM | POA: Diagnosis not present

## 2020-09-30 DIAGNOSIS — J449 Chronic obstructive pulmonary disease, unspecified: Secondary | ICD-10-CM | POA: Diagnosis not present

## 2020-10-06 DIAGNOSIS — J449 Chronic obstructive pulmonary disease, unspecified: Secondary | ICD-10-CM | POA: Diagnosis not present

## 2020-10-06 DIAGNOSIS — K5903 Drug induced constipation: Secondary | ICD-10-CM | POA: Diagnosis not present

## 2020-10-06 DIAGNOSIS — Z79891 Long term (current) use of opiate analgesic: Secondary | ICD-10-CM | POA: Diagnosis not present

## 2020-10-06 DIAGNOSIS — I10 Essential (primary) hypertension: Secondary | ICD-10-CM | POA: Diagnosis not present

## 2020-10-06 DIAGNOSIS — M545 Low back pain, unspecified: Secondary | ICD-10-CM | POA: Diagnosis not present

## 2020-10-06 DIAGNOSIS — G894 Chronic pain syndrome: Secondary | ICD-10-CM | POA: Diagnosis not present

## 2020-10-06 DIAGNOSIS — M961 Postlaminectomy syndrome, not elsewhere classified: Secondary | ICD-10-CM | POA: Diagnosis not present

## 2020-10-06 DIAGNOSIS — E0842 Diabetes mellitus due to underlying condition with diabetic polyneuropathy: Secondary | ICD-10-CM | POA: Diagnosis not present

## 2020-10-06 DIAGNOSIS — G8929 Other chronic pain: Secondary | ICD-10-CM | POA: Diagnosis not present

## 2020-10-06 DIAGNOSIS — M5440 Lumbago with sciatica, unspecified side: Secondary | ICD-10-CM | POA: Diagnosis not present

## 2020-10-15 DIAGNOSIS — E119 Type 2 diabetes mellitus without complications: Secondary | ICD-10-CM | POA: Diagnosis not present

## 2020-10-23 DIAGNOSIS — J449 Chronic obstructive pulmonary disease, unspecified: Secondary | ICD-10-CM | POA: Diagnosis not present

## 2020-10-27 DIAGNOSIS — G894 Chronic pain syndrome: Secondary | ICD-10-CM | POA: Diagnosis not present

## 2020-10-29 DIAGNOSIS — J449 Chronic obstructive pulmonary disease, unspecified: Secondary | ICD-10-CM | POA: Diagnosis not present

## 2020-10-29 DIAGNOSIS — M5440 Lumbago with sciatica, unspecified side: Secondary | ICD-10-CM | POA: Diagnosis not present

## 2020-10-29 DIAGNOSIS — E0842 Diabetes mellitus due to underlying condition with diabetic polyneuropathy: Secondary | ICD-10-CM | POA: Diagnosis not present

## 2020-10-29 DIAGNOSIS — G894 Chronic pain syndrome: Secondary | ICD-10-CM | POA: Diagnosis not present

## 2020-10-29 DIAGNOSIS — K5903 Drug induced constipation: Secondary | ICD-10-CM | POA: Diagnosis not present

## 2020-10-29 DIAGNOSIS — M545 Low back pain, unspecified: Secondary | ICD-10-CM | POA: Diagnosis not present

## 2020-10-29 DIAGNOSIS — G8929 Other chronic pain: Secondary | ICD-10-CM | POA: Diagnosis not present

## 2020-10-29 DIAGNOSIS — I10 Essential (primary) hypertension: Secondary | ICD-10-CM | POA: Diagnosis not present

## 2020-10-29 DIAGNOSIS — M961 Postlaminectomy syndrome, not elsewhere classified: Secondary | ICD-10-CM | POA: Diagnosis not present

## 2020-10-29 DIAGNOSIS — G8928 Other chronic postprocedural pain: Secondary | ICD-10-CM | POA: Diagnosis not present

## 2020-10-30 DIAGNOSIS — E785 Hyperlipidemia, unspecified: Secondary | ICD-10-CM | POA: Diagnosis not present

## 2020-10-30 DIAGNOSIS — E119 Type 2 diabetes mellitus without complications: Secondary | ICD-10-CM | POA: Diagnosis not present

## 2020-10-30 DIAGNOSIS — E669 Obesity, unspecified: Secondary | ICD-10-CM | POA: Diagnosis not present

## 2020-10-30 DIAGNOSIS — I10 Essential (primary) hypertension: Secondary | ICD-10-CM | POA: Diagnosis not present

## 2020-11-03 DIAGNOSIS — K219 Gastro-esophageal reflux disease without esophagitis: Secondary | ICD-10-CM | POA: Diagnosis not present

## 2020-11-03 DIAGNOSIS — E785 Hyperlipidemia, unspecified: Secondary | ICD-10-CM | POA: Diagnosis not present

## 2020-11-03 DIAGNOSIS — E1165 Type 2 diabetes mellitus with hyperglycemia: Secondary | ICD-10-CM | POA: Diagnosis not present

## 2020-11-03 DIAGNOSIS — K2100 Gastro-esophageal reflux disease with esophagitis, without bleeding: Secondary | ICD-10-CM | POA: Diagnosis not present

## 2020-11-03 DIAGNOSIS — R0602 Shortness of breath: Secondary | ICD-10-CM | POA: Diagnosis not present

## 2020-11-03 DIAGNOSIS — J449 Chronic obstructive pulmonary disease, unspecified: Secondary | ICD-10-CM | POA: Diagnosis not present

## 2020-11-03 DIAGNOSIS — R079 Chest pain, unspecified: Secondary | ICD-10-CM | POA: Diagnosis not present

## 2020-11-03 DIAGNOSIS — I10 Essential (primary) hypertension: Secondary | ICD-10-CM | POA: Diagnosis not present

## 2020-11-03 DIAGNOSIS — I2510 Atherosclerotic heart disease of native coronary artery without angina pectoris: Secondary | ICD-10-CM | POA: Diagnosis not present

## 2020-11-03 DIAGNOSIS — M7022 Olecranon bursitis, left elbow: Secondary | ICD-10-CM | POA: Diagnosis not present

## 2020-11-10 DIAGNOSIS — M7022 Olecranon bursitis, left elbow: Secondary | ICD-10-CM | POA: Diagnosis not present

## 2020-11-13 DIAGNOSIS — J449 Chronic obstructive pulmonary disease, unspecified: Secondary | ICD-10-CM | POA: Diagnosis not present

## 2020-11-14 DIAGNOSIS — R079 Chest pain, unspecified: Secondary | ICD-10-CM | POA: Diagnosis not present

## 2020-12-09 DIAGNOSIS — G894 Chronic pain syndrome: Secondary | ICD-10-CM | POA: Diagnosis not present

## 2020-12-10 ENCOUNTER — Other Ambulatory Visit: Payer: Self-pay | Admitting: Neurosurgery

## 2020-12-10 DIAGNOSIS — Z79891 Long term (current) use of opiate analgesic: Secondary | ICD-10-CM | POA: Diagnosis not present

## 2020-12-10 DIAGNOSIS — M545 Low back pain, unspecified: Secondary | ICD-10-CM | POA: Diagnosis not present

## 2020-12-10 DIAGNOSIS — R69 Illness, unspecified: Secondary | ICD-10-CM | POA: Diagnosis not present

## 2020-12-10 DIAGNOSIS — M79662 Pain in left lower leg: Secondary | ICD-10-CM | POA: Diagnosis not present

## 2020-12-10 DIAGNOSIS — M79604 Pain in right leg: Secondary | ICD-10-CM | POA: Diagnosis not present

## 2020-12-10 DIAGNOSIS — F1120 Opioid dependence, uncomplicated: Secondary | ICD-10-CM | POA: Diagnosis not present

## 2020-12-11 DIAGNOSIS — J449 Chronic obstructive pulmonary disease, unspecified: Secondary | ICD-10-CM | POA: Diagnosis not present

## 2020-12-16 ENCOUNTER — Other Ambulatory Visit: Payer: Self-pay

## 2020-12-16 ENCOUNTER — Encounter
Admission: RE | Admit: 2020-12-16 | Discharge: 2020-12-16 | Disposition: A | Discharge status: Home / Self Care | Payer: Medicare HMO | Source: Ambulatory Visit | Attending: Neurosurgery | Admitting: Neurosurgery

## 2020-12-16 NOTE — Patient Instructions (Addendum)
Your procedure is scheduled on: 12[facility]. To find out your arrival time please call [phone] between 1PM - 3PM on 12/19/20.  Remember: Instructions that are not followed completely may result in serious medical risk, up to and including death, or upon the discretion of your surgeon and anesthesiologist your surgery may need to be rescheduled.     _X__ 1. Do not eat food after midnight the night before your procedure.                 No gum chewing or hard candies. You may drink clear liquids up to 2 hours                 before you are scheduled to arrive for your surgery- DO not drink clear                 liquids within 2 hours of the start of your surgery.                 Clear Liquids include:  water, apple juice without pulp, clear carbohydrate                 drink such as Clearfast or Gatorade, Black Coffee or Tea (Do not add                 anything to coffee or tea). Diabetics water only  __X__2.  On the morning of surgery brush your teeth with toothpaste and water, you                 may rinse your mouth with mouthwash if you wish.  Do not swallow any              toothpaste of mouthwash.     _X__ 3.  No Alcohol for 24 hours before or after surgery.   _X__ 4.  Do Not Smoke or use e-cigarettes For 24 Hours Prior to Your Surgery.                 Do not use any chewable tobacco products for at least 6 hours prior to                 surgery.  ____  5.  Bring all medications with you on the day of surgery if instructed.   __X__  6.  Notify your doctor if there is any change in your medical condition      (cold, fever, infections).     Do not wear jewelry, make-up, hairpins, clips or nail polish. Do not wear lotions, powders, or perfumes.  Do not shave body hair 48 hours prior to surgery. Men may shave face and neck. Do not bring valuables to the hospital.    [facility] is not responsible for any  belongings or valuables.  Contacts, dentures/partials or body piercings may not be worn into surgery. Bring a case for your contacts, glasses or hearing aids, a denture cup will be supplied. Leave your suitcase in the car. After surgery it may be brought to your room. For patients admitted to the hospital, discharge time is determined by your treatment team.   Patients discharged the day of surgery will not be allowed to drive home.   Please read over the following fact sheets that you were given:     __X__ Take these medicines the morning of surgery with A SIP OF WATER:  1. atorvastatin (LIPITOR) 80 MG tablet  2. gabapentin (NEURONTIN) 1200 MG tablet  3. icosapent Ethyl (VASCEPA) 1 g capsule  4. isosorbide mononitrate (IMDUR) 120 MG 24 hr tablet  5. metoprolol succinate (TOPROL-XL) 100 MG 24 hr tablet  6. oxyCODONE ER 9 MG C12A  7. pantoprazole (PROTONIX) 40 MG tablet  8. ranolazine (RANEXA) 1000 MG SR tablet  9. tamsulosin (FLOMAX) 0.4 MG CAPS capsule  10. tiZANidine (ZANAFLEX) 4 MG tablet  ____ Fleet Enema (as directed)   __X__ Use CHG Soap/SAGE wipes as directed  ____ Use inhalers on the day of surgery  ____ Stop metformin/Janumet/Farxiga 2 days prior to surgery    ____ Take 1/2 of usual insulin dose the night before surgery. No insulin the morning          of surgery.   ____ Stop Blood Thinners Coumadin/Plavix/Xarelto/Pleta/Pradaxa/Eliquis/Effient/Aspirin  on   Or contact your Surgeon, Cardiologist or Medical Doctor regarding  ability to stop your blood thinners  __X__ Stop Anti-inflammatories 7 days before surgery such as Advil, Ibuprofen, Motrin,  BC or Goodies Powder, Naprosyn, Naproxen, Aleve, Aspirin    __X__ Stop all herbal supplements, fish oil or vitamin E until after surgery.    ____ Bring C-Pap to the hospital.    Per Dr Jonathon Jordan orders you are to hold  Aspirin and Brilinta for 7 days prior to your procedure and may restart 7 days after your second  procedure on 12/29/20.

## 2020-12-17 ENCOUNTER — Encounter: Payer: Self-pay | Admitting: Neurosurgery

## 2020-12-17 NOTE — Progress Notes (Signed)
Perioperative Services  Pre-Admission/Anesthesia Testing Clinical Review  Date: 12/19/20  Patient Demographics:  Name: Jose Carroll DOB:   1965-12-20 MRN:   [id number]  Planned Surgical Procedure(s):    Case: [id number] Date/Time: 12/22/20 1606   Procedure: THORACIC LAMINECTOMY FOR SPINAL CORD STIMULATOR PADDLE TRIAL (MEDTRONIC)   Anesthesia type: General   Pre-op diagnosis: g89.4 chronic pain   Location: [facility] OR ROOM 03 / [place] [facility]   Surgeons: Deetta Perla, MD   NOTE: Available PAT nursing documentation and vital signs have been reviewed. Clinical nursing staff has updated patient's PMH/PSHx, current medication list, and drug allergies/intolerances to ensure comprehensive history available to assist in medical decision making as it pertains to the aforementioned surgical procedure and anticipated anesthetic course. Extensive review of available clinical information performed. [place] PMH and PSHx updated with any diagnoses/procedures that  may have been inadvertently omitted during his intake with the pre-admission testing department's nursing staff.  Clinical Discussion:  Jose Carroll is a 55 y.o. male who is submitted for pre-surgical anesthesia review and clearance prior to him undergoing the above procedure. Patient is a Former Smoker (25 pack years; quit 06/2015). Pertinent PMH includes: CAD, angina, PVD with lower extremity claudication, HTN, T2DM, dyspnea, COPD, CKD, GERD (on daily PPI, OA, chronic pain (on COT),  Patient is followed by cardiology Humphrey Rolls, MD). He was last seen in the cardiology clinic on 11/03/2020; notes reviewed.  At the time of his clinic visit, patient doing well overall from a cardiovascular perspective.  Patient complained of episodes of chest pain.  Additionally, he had chronic exertional dyspnea related to his underlying COPD and former smoking history.  He denied any PND, orthopnea, palpitations, significant peripheral edema,  vertiginous symptoms, or presyncope/syncope.  PMH significant for cardiovascular diagnoses.  Patient underwent diagnostic left heart catheterization on 09/10/2014 revealing multivessel CAD; 50% mid LCx, 50% mid LAD, and 100% proximal to mid RCA.  There was good collateral flow arising from the LAD.  Intervention was deferred opting for medical management.  Repeat diagnostic heart catheterization performed on 06/26/2015 revealing multivessel CAD; 20% mid RCA, 30% distal LCx, and 40% distal LAD.  Again, there was good collateral flow arising from the LAD/LCx.  Intervention deferred opting for medical management.  Patient underwent diagnostic left heart catheterization on 10/28/2017 revealing a normal left ventricular systolic function with an EF of 60%.  There was multivessel CAD; high percent mid RCA, 95% mid to distal LCx, 30% distal LCx.  PCI was performed placing a 2.5 x 15 mm Resolute Onyx DES x1 to the mid LCx resulting in 0% residual stenosis and restoration of TIMI-3 flow.  Of note, RCA chronically occluded with left-to-right collateral flow; no intervention.  Patient underwent TTE on 11/14/2020 that revealed normal left ventricular systolic function with a hyperdynamic LVEF of 84%.  Diastolic parameters consistent with abnormal relaxation (G1DD).  There was trace mitral valve regurgitation.  There was no evidence of a significant transvalvular gradient to suggest stenosis.  CYNCERE Carroll is scheduled for a THORACIC LAMINECTOMY FOR SPINAL CORD STIMULATOR PADDLE TRIAL on 12/22/2020 with Dr. Deetta Perla, MD. Given patient's past medical history significant for cardiovascular diagnoses, presurgical cardiac clearance was sought by the PAT team.  Additionally, patient with multiple medical comorbidities, presurgical clearance was also sought from his internal/family medicine provider.  Specialty clearances were obtained as follows.  Per internal medicine ([place], ANP-C), "this patient is optimized  for surgery and may proceed with planned surgical intervention at an overall  MODERATE risk of perioperative complication".  Per cardiology Humphrey Rolls, MD), "this patient is optimized for planned procedure to the extent possible.  He is considered to be an overall HIGH risk for surgery".  Again this patient is on daily antiplatelet therapy.  He has been instructed on recommendations from cardiology for holding his daily ASA (last dose 12/14/2020) and ticagrelor (last dose 12/14/2020) prior to his procedure with plans hold 7 days postoperatively (total of 14 days of therapy).  This has been reviewed with cardiology and they agree with plans with the caveat being that this increases patient's risk for CVA and/or cardiac event.  Patient denies previous perioperative complications with anesthesia in the past. In review of the available records, it is noted that patient underwent a general anesthetic course here (ASA III) in 02/2013 without documented complications.   Vitals with BMI 12/16/2020 07/02/2020 07/02/2020  Height 5' 10"  - -  Weight 201 lbs - -  BMI 55.97 - -  Systolic - 416 384  Diastolic - 90 89  Pulse - - -    Providers/Specialists:   NOTE: Primary physician provider listed below. Patient may have been seen by APP or partner within same practice.   PROVIDER ROLE / SPECIALTY LAST Edsel Petrin, MD Neurosurgery 12/09/2020  Danelle Berry, NP Primary Care Provider 11/03/2020  Neoma Laming, MD Cardiology 11/03/2020   Allergies:  Naproxen  Current Home Medications:   No current facility-administered medications for this encounter.    albuterol (PROVENTIL HFA;VENTOLIN HFA) 108 (90 BASE) MCG/ACT inhaler   aspirin EC 81 MG tablet   atorvastatin (LIPITOR) 80 MG tablet   budesonide (PULMICORT) 0.25 MG/2ML nebulizer solution   cetirizine (ZYRTEC) 10 MG tablet   empagliflozin (JARDIANCE) 25 MG TABS tablet   formoterol (PERFOROMIST) 20 MCG/2ML nebulizer solution   furosemide (LASIX) 40  MG tablet   gabapentin (NEURONTIN) 600 MG tablet   glipiZIDE (GLUCOTROL XL) 5 MG 24 hr tablet   icosapent Ethyl (VASCEPA) 1 g capsule   insulin degludec (TRESIBA) 100 UNIT/ML FlexTouch Pen   isosorbide mononitrate (IMDUR) 120 MG 24 hr tablet   lubiprostone (AMITIZA) 24 MCG capsule   metoprolol succinate (TOPROL-XL) 100 MG 24 hr tablet   Multiple Vitamin (MULTIVITAMIN WITH MINERALS) TABS tablet   nitroGLYCERIN (NITROSTAT) 0.4 MG SL tablet   oxyCODONE ER 9 MG C12A   pantoprazole (PROTONIX) 40 MG tablet   ranolazine (RANEXA) 1000 MG SR tablet   tamsulosin (FLOMAX) 0.4 MG CAPS capsule   ticagrelor (BRILINTA) 60 MG TABS tablet   tiZANidine (ZANAFLEX) 4 MG tablet   TRULICITY 5.36 IW/8.0HO SOPN   History:   Past Medical History:  Diagnosis Date   Anginal pain (HCC)    Arthritis    Chronic pain    Chronic, continuous use of opioids    CKD (chronic kidney disease)    Claudication of both lower extremities (HCC)    COPD (chronic obstructive pulmonary disease) (HCC)    Coronary artery disease    a.) LHC 09/10/2014: 50% mLCx, 50% mLAD, 100% p-m RCA; good collaterals from LAD; med mgmt. b.) LHC 06/26/2015: 100% mRCA, 30% dLCx, 40% dLAD; good collaterals from LAD/LCx; med mgmt. c.) LHC 10/28/2017: EF 60%; 100% mRCA, 95% m-dLCx, 30% dLCx; PCI placing a 2.5 x 15 mm Resolute Onyx DES x1 to mLCx. Chronic RCA occlusion with L-R collaterals.   Erectile dysfunction    GERD (gastroesophageal reflux disease)    HLD (hyperlipidemia)    Hypertension    Long term current  use of antithrombotics/antiplatelets    a.) ASA + ticagrelor   Peripheral edema    PVD (peripheral vascular disease) (HCC)    Shortness of breath    T2DM (type 2 diabetes mellitus) ([place])    Vitamin D deficiency    Past Surgical History:  Procedure Laterality Date   BACK SURGERY     4   CARDIAC CATHETERIZATION N/A 09/10/2014   Procedure: Left Heart Cath and Coronary Angiogram ;  Surgeon: Dionisio David, MD;  Location: [place] [facility];  Service: Cardiovascular;  Laterality: N/A;   CARDIAC CATHETERIZATION Left 06/26/2015   Procedure: Left Heart Cath and Coronary Angiography;  Surgeon: Dionisio David, MD;  Location: [place] [facility];  Service: Cardiovascular;  Laterality: Left;   CORONARY STENT INTERVENTION N/A 10/28/2017   Procedure: CORONARY STENT INTERVENTION (2.5 x 15 mm Resolute Onyx DES x 1 to mLCx);  Surgeon: Wellington Hampshire, MD;  Location: [place] [facility];  Service: Cardiovascular;  Laterality: N/A;   FETAL SURGERY FOR CONGENITAL HERNIA  2012   FINGER SURGERY     sixth finger removed   HERNIA REPAIR     LEFT HEART CATH AND CORONARY ANGIOGRAPHY Left 10/28/2017   Procedure: LEFT HEART CATH AND CORONARY ANGIOGRAPHY;  Surgeon: Dionisio David, MD;  Location: [place] [facility];  Service: Cardiovascular;  Laterality: Left;   SUBCLAVIAN ARTERY STENT     TONSILLECTOMY     Family History  Problem Relation Age of Onset   Cancer Mother    Diabetes Mother    Anuerysm Father    Social History   Tobacco Use   Smoking status: Former    Packs/day: 1.00    Years: 25.00    Pack years: 25.00    Types: Cigarettes    Quit date: 07/04/2015    Years since quitting: 5.4   Smokeless tobacco: Former    Quit date: 07/01/2015  Vaping Use   Vaping Use: Every day   Devices: zero nicotine  Substance Use Topics   Alcohol use: No    Alcohol/week: 0.0 standard drinks   Drug use: No    Pertinent Clinical Results:  LABS: Labs reviewed: Acceptable for surgery.  Component Date Value Ref Range Status  WBC 10/30/2020 8.2  3.4 - 10.8 x10E3/uL Final  RBC 10/30/2020 5.32  4.14 - 5.80 x10E6/uL Final  Hemoglobin 10/30/2020 16.1  13.0 - 17.7 g/dL Final  Hematocrit 10/30/2020 47.1  37.5 - 51.0 % Final  MCV 10/30/2020 89 79 - 97 fL Final  MCH 10/30/2020 30.  26.6 - 33.0 pg Final  MCHC 10/30/2020 34.2  31.5 - 35.7 g/dL Final  RDW 10/30/2020 13.5  11.6 - 15.4 % Final  Platelets 10/30/2020 128 (L)  150 - 450  x10E3/uL Final   Component Date Value Ref Range Status  Glucose 10/30/2020 113 (H) 70-99 mg/dL Final  BUN 10/30/2020 7 6-24 mg/dL Final  Creatinine 10/30/2020 1.16 0.76-1.27 mg/dL Final  eGFR 10/30/2020 75 >59 mL/min/1.73 Final  Sodium 10/30/2020 141 134-144 mmol/L Final  Potassium 10/30/2020 4.1 3.5-5.2 mmol/L Final  Chloride 10/30/2020 101 96-106 mmol/L Final  Carbon Dioxide 10/30/2020 25 20 mmol/L Final  Calcium 10/30/2020 9.3 8.7-10.2 mg/dL Final  Protein, Total 10/30/2020   6.7 6.0-8.5 g/dL Final  Albumin 10/30/2020   4.2 3.8-4.9 g/dL Final  Globulin, Total 10/30/2020   2.5 1.5-4.5 g/dL Final  A/G ratio 10/30/2020   1.7 1.2-2.2 Final  Bilirubin, Total 10/30/2020   0.5 0.0-1.2 mg/dL Final  Alkaline phosphatase  10/30/2020   152 (H) 44-121 IU/L Final  AST (SGOT) 10/30/2020   32 0-40 IU/L Final  ALC (SGPT) 10/30/2020 38 0-44 IU/L Final   Component Date Value Ref Range Status  Hemoglobin A1c 10/30/2020 8.2% (H) 4.8-5.6% Final    ECG: Date: 11/03/2020 Rate: 83 bpm Rhythm: normal sinus Axis (leads I and aVF): Normal Intervals: PR 138 ms. QRS 100 ms. QTc 460 ms. ST segment and T wave changes: No evidence of acute ST segment elevation or depression Comparison: Previous tracing performed on 10/29/2017 showed ST and T wave abnormalities in the inferior and anterolateral leads; resolved on current tracing.   IMAGING / PROCEDURES: TRANSTHORACIC ECHOCARDIOGRAM performed on 11/14/2020 Normal left ventricular systolic function with a hyperdynamic LVEF of 84%.   Diastolic parameters consistent with abnormal relaxation (G1DD).   There was trace mitral valve regurgitation.   There was no evidence of a significant transvalvular gradient to suggest stenosis.  LEFT HEART CATHETERIZATION AND CORONARY ANGIOGRAPHY performed on 10/28/2017 LVEF 60% Multivessel CAD 100% stenosis of the mid RCA 95% stenosis of the mid to distal LCx 30% stenosis of the distal LCx Successful PCI 2.5 x  15 mm Resolute Onyx DES x1 placed to the mid LCx resulting in 0% residual stenosis and restoration of TIMI-3 flow CTO of the RCA with left-to-right collateral flow; no intervention    Impression and Plan:  HENDERSON FRAMPTON has been referred for pre-anesthesia review and clearance prior to him undergoing the planned anesthetic and procedural courses. Available labs, pertinent testing, and imaging results were personally reviewed by me. This patient has been appropriately cleared by cardiology (HIGH) and internal/family medicine (MODERATE) with the individually indicated risk of significant perioperative complications.   Based on clinical review performed today (12/19/20), barring any significant acute changes in the patient's overall condition, it is anticipated that he will be able to proceed with the planned surgical intervention. Any acute changes in clinical condition may necessitate his procedure being postponed and/or cancelled. Patient will meet with anesthesia team (MD and/or CRNA) on the day of his procedure for preoperative evaluation/assessment. Questions regarding anesthetic course will be fielded at that time.   Pre-surgical instructions were reviewed with the patient during his PAT appointment and questions were fielded by PAT clinical staff. Patient was advised that if any questions or concerns arise prior to his procedure then he should return a call to PAT and/or his surgeon's office to discuss.  Honor Loh, MSN, APRN, FNP-C, CEN [facility]  Peri-operative Services Nurse Practitioner Phone: [phone] Fax: [phone] 12/19/20 4:46 PM  NOTE: This note has been prepared using Dragon dictation software. Despite my best ability to proofread, there is always the potential that unintentional transcriptional errors may still occur from this process.

## 2020-12-21 MED ORDER — SODIUM CHLORIDE 0.9 % IV SOLN: INTRAVENOUS | Status: DC

## 2020-12-21 MED ORDER — CHLORHEXIDINE GLUCONATE 0.12 % MT SOLN: 15.0000 mL | Freq: Once | OROMUCOSAL | Status: AC

## 2020-12-21 MED ORDER — ORAL CARE MOUTH RINSE: 15.0000 mL | Freq: Once | OROMUCOSAL | Status: AC

## 2020-12-22 ENCOUNTER — Ambulatory Visit: Payer: Medicare HMO | Admitting: Urgent Care

## 2020-12-22 ENCOUNTER — Encounter: Payer: Self-pay | Admitting: Neurosurgery

## 2020-12-22 ENCOUNTER — Ambulatory Visit
Admission: RE | Admit: 2020-12-22 | Discharge: 2020-12-22 | Disposition: A | Discharge status: Home / Self Care | Payer: Medicare HMO | Attending: Neurosurgery | Admitting: Neurosurgery

## 2020-12-22 ENCOUNTER — Ambulatory Visit: Payer: Medicare HMO

## 2020-12-22 ENCOUNTER — Encounter
Admission: RE | Disposition: A | Discharge status: Home / Self Care | Payer: Self-pay | Source: Home / Self Care | Attending: Neurosurgery

## 2020-12-22 ENCOUNTER — Other Ambulatory Visit: Payer: Self-pay

## 2020-12-22 DIAGNOSIS — E1165 Type 2 diabetes mellitus with hyperglycemia: Secondary | ICD-10-CM | POA: Insufficient documentation

## 2020-12-22 DIAGNOSIS — K219 Gastro-esophageal reflux disease without esophagitis: Secondary | ICD-10-CM | POA: Insufficient documentation

## 2020-12-22 DIAGNOSIS — Z955 Presence of coronary angioplasty implant and graft: Secondary | ICD-10-CM | POA: Insufficient documentation

## 2020-12-22 DIAGNOSIS — E1151 Type 2 diabetes mellitus with diabetic peripheral angiopathy without gangrene: Secondary | ICD-10-CM | POA: Diagnosis not present

## 2020-12-22 DIAGNOSIS — D696 Thrombocytopenia, unspecified: Secondary | ICD-10-CM | POA: Diagnosis not present

## 2020-12-22 DIAGNOSIS — G894 Chronic pain syndrome: Secondary | ICD-10-CM | POA: Diagnosis not present

## 2020-12-22 DIAGNOSIS — I129 Hypertensive chronic kidney disease with stage 1 through stage 4 chronic kidney disease, or unspecified chronic kidney disease: Secondary | ICD-10-CM | POA: Diagnosis not present

## 2020-12-22 DIAGNOSIS — J449 Chronic obstructive pulmonary disease, unspecified: Secondary | ICD-10-CM | POA: Insufficient documentation

## 2020-12-22 DIAGNOSIS — Z79899 Other long term (current) drug therapy: Secondary | ICD-10-CM | POA: Insufficient documentation

## 2020-12-22 DIAGNOSIS — Z87891 Personal history of nicotine dependence: Secondary | ICD-10-CM | POA: Diagnosis not present

## 2020-12-22 DIAGNOSIS — Z794 Long term (current) use of insulin: Secondary | ICD-10-CM | POA: Insufficient documentation

## 2020-12-22 DIAGNOSIS — I25119 Atherosclerotic heart disease of native coronary artery with unspecified angina pectoris: Secondary | ICD-10-CM | POA: Insufficient documentation

## 2020-12-22 DIAGNOSIS — N189 Chronic kidney disease, unspecified: Secondary | ICD-10-CM | POA: Diagnosis not present

## 2020-12-22 DIAGNOSIS — Z4542 Encounter for adjustment and management of neuropacemaker (brain) (peripheral nerve) (spinal cord): Secondary | ICD-10-CM | POA: Diagnosis not present

## 2020-12-22 DIAGNOSIS — Z7984 Long term (current) use of oral hypoglycemic drugs: Secondary | ICD-10-CM | POA: Insufficient documentation

## 2020-12-22 DIAGNOSIS — E1122 Type 2 diabetes mellitus with diabetic chronic kidney disease: Secondary | ICD-10-CM | POA: Diagnosis not present

## 2020-12-22 DIAGNOSIS — E785 Hyperlipidemia, unspecified: Secondary | ICD-10-CM | POA: Diagnosis not present

## 2020-12-22 DIAGNOSIS — Z419 Encounter for procedure for purposes other than remedying health state, unspecified: Secondary | ICD-10-CM

## 2020-12-22 DIAGNOSIS — Z7902 Long term (current) use of antithrombotics/antiplatelets: Secondary | ICD-10-CM | POA: Insufficient documentation

## 2020-12-22 HISTORY — DX: Peripheral vascular disease, unspecified: I73.9

## 2020-12-22 HISTORY — DX: Chronic kidney disease, unspecified: N18.9

## 2020-12-22 HISTORY — DX: Other chronic pain: G89.29

## 2020-12-22 HISTORY — DX: Type 2 diabetes mellitus without complications: E11.9

## 2020-12-22 HISTORY — DX: Vitamin D deficiency, unspecified: E55.9

## 2020-12-22 HISTORY — DX: Localized edema: R60.0

## 2020-12-22 HISTORY — DX: Opioid use, unspecified, uncomplicated: F11.90

## 2020-12-22 HISTORY — PX: THORACIC LAMINECTOMY FOR SPINAL CORD STIMULATOR: SHX6887

## 2020-12-22 HISTORY — DX: Male erectile dysfunction, unspecified: N52.9

## 2020-12-22 HISTORY — DX: Long term (current) use of antithrombotics/antiplatelets: Z79.02

## 2020-12-22 HISTORY — DX: Hyperlipidemia, unspecified: E78.5

## 2020-12-22 HISTORY — DX: Edema, unspecified: R60.9

## 2020-12-22 LAB — SURGICAL PCR SCREEN
MRSA, PCR: NEGATIVE
Staphylococcus aureus: POSITIVE — AB

## 2020-12-22 LAB — URINALYSIS, ROUTINE W REFLEX MICROSCOPIC
Bilirubin Urine: NEGATIVE
Glucose, UA: >=500 mg/dL — AB
Hgb urine dipstick: NEGATIVE
Ketones, ur: NEGATIVE mg/dL
Nitrite: NEGATIVE
Protein, ur: NEGATIVE mg/dL
Specific Gravity, Urine: 1.026 (ref 1.005–1.030)
pH: 6 (ref 5.0–8.0)

## 2020-12-22 LAB — TYPE AND SCREEN
ABO/RH(D): A NEG
Antibody Screen: NEGATIVE

## 2020-12-22 LAB — BASIC METABOLIC PANEL
Anion gap: 7 (ref 5–15)
BUN: 9 mg/dL (ref 6–20)
CO2: 30 mmol/L (ref 22–32)
Calcium: 9.7 mg/dL (ref 8.9–10.3)
Chloride: 103 mmol/L (ref 98–111)
Creatinine, Ser: 1.1 mg/dL (ref 0.61–1.24)
GFR, Estimated: >60 mL/min (ref >60)
Glucose, Bld: 112 mg/dL — ABNORMAL HIGH (ref 70–99)
Potassium: 3.4 mmol/L — ABNORMAL LOW (ref 3.5–5.1)
Sodium: 140 mmol/L (ref 135–145)

## 2020-12-22 LAB — CBC
HCT: 49.6 % (ref 39.0–52.0)
Hemoglobin: 16.6 g/dL (ref 13.0–17.0)
MCH: 29.6 pg (ref 26.0–34.0)
MCHC: 33.5 g/dL (ref 30.0–36.0)
MCV: 88.6 fL (ref 80.0–100.0)
Platelets: 137 10*3/uL — ABNORMAL LOW (ref 150–400)
RBC: 5.6 MIL/uL (ref 4.22–5.81)
RDW: 14.2 % (ref 11.5–15.5)
WBC: 8.9 10*3/uL (ref 4.0–10.5)
nRBC: 0 % (ref 0.0–0.2)

## 2020-12-22 LAB — GLUCOSE, CAPILLARY
Glucose-Capillary: 111 mg/dL — ABNORMAL HIGH (ref 70–99)
Glucose-Capillary: 87 mg/dL (ref 70–99)

## 2020-12-22 LAB — PROTIME-INR
INR: 1 (ref 0.8–1.2)
Prothrombin Time: 13.6 seconds (ref 11.4–15.2)

## 2020-12-22 LAB — APTT: aPTT: 41 seconds — ABNORMAL HIGH (ref 24–36)

## 2020-12-22 SURGERY — THORACIC LAMINECTOMY FOR SPINAL CORD STIMULATOR
Anesthesia: General | Site: Back

## 2020-12-22 MED ORDER — VANCOMYCIN HCL 1000 MG IV SOLR
INTRAVENOUS | Status: AC
  Filled 2020-12-22: qty 20

## 2020-12-22 MED ORDER — KETAMINE HCL 10 MG/ML IJ SOLN
INTRAMUSCULAR | Status: DC | PRN
  Administered 2020-12-22: 20 mg via INTRAVENOUS

## 2020-12-22 MED ORDER — PROPOFOL 500 MG/50ML IV EMUL
INTRAVENOUS | Status: DC | PRN
  Administered 2020-12-22: 130 ug/kg/min via INTRAVENOUS

## 2020-12-22 MED ORDER — CHLORHEXIDINE GLUCONATE 0.12 % MT SOLN
OROMUCOSAL | Status: AC
  Administered 2020-12-22: 15 mL via OROMUCOSAL
  Filled 2020-12-22: qty 15

## 2020-12-22 MED ORDER — CEFAZOLIN SODIUM-DEXTROSE 2-4 GM/100ML-% IV SOLN
2.0000 g | INTRAVENOUS | Status: AC
  Administered 2020-12-22: 2 g via INTRAVENOUS

## 2020-12-22 MED ORDER — VANCOMYCIN HCL 1000 MG IV SOLR
INTRAVENOUS | Status: DC | PRN
  Administered 2020-12-22: 1000 mg

## 2020-12-22 MED ORDER — 0.9 % SODIUM CHLORIDE (POUR BTL) OPTIME
TOPICAL | Status: DC | PRN
  Administered 2020-12-22: 1000 mL

## 2020-12-22 MED ORDER — CEFAZOLIN SODIUM-DEXTROSE 2-4 GM/100ML-% IV SOLN
INTRAVENOUS | Status: AC
  Filled 2020-12-22: qty 100

## 2020-12-22 MED ORDER — OXYCODONE HCL 5 MG/5ML PO SOLN: 5.0000 mg | Freq: Once | ORAL | Status: AC | PRN

## 2020-12-22 MED ORDER — MIDAZOLAM HCL 2 MG/2ML IJ SOLN
INTRAMUSCULAR | Status: DC | PRN
  Administered 2020-12-22: 2 mg via INTRAVENOUS

## 2020-12-22 MED ORDER — DROPERIDOL 2.5 MG/ML IJ SOLN
0.6250 mg | Freq: Once | INTRAMUSCULAR | Status: DC | PRN
  Filled 2020-12-22: qty 2

## 2020-12-22 MED ORDER — PHENYLEPHRINE HCL-NACL 20-0.9 MG/250ML-% IV SOLN
INTRAVENOUS | Status: DC | PRN
  Administered 2020-12-22: 50 ug/min via INTRAVENOUS

## 2020-12-22 MED ORDER — ONDANSETRON HCL 4 MG/2ML IJ SOLN
INTRAMUSCULAR | Status: AC
  Filled 2020-12-22: qty 2

## 2020-12-22 MED ORDER — OXYCODONE HCL 5 MG PO TABS
ORAL_TABLET | ORAL | Status: AC
  Filled 2020-12-22: qty 1

## 2020-12-22 MED ORDER — LIDOCAINE HCL (CARDIAC) PF 100 MG/5ML IV SOSY
PREFILLED_SYRINGE | INTRAVENOUS | Status: DC | PRN
  Administered 2020-12-22: 80 mg via INTRAVENOUS

## 2020-12-22 MED ORDER — PROPOFOL 1000 MG/100ML IV EMUL
INTRAVENOUS | Status: AC
  Filled 2020-12-22: qty 100

## 2020-12-22 MED ORDER — PROPOFOL 10 MG/ML IV BOLUS
INTRAVENOUS | Status: AC
  Filled 2020-12-22: qty 20

## 2020-12-22 MED ORDER — REMIFENTANIL HCL 1 MG IV SOLR
INTRAVENOUS | Status: DC | PRN
  Administered 2020-12-22: .15 ug/kg/min via INTRAVENOUS

## 2020-12-22 MED ORDER — ACETAMINOPHEN 10 MG/ML IV SOLN: 1000.0000 mg | Freq: Once | INTRAVENOUS | Status: DC | PRN

## 2020-12-22 MED ORDER — CEPHALEXIN 500 MG PO CAPS: 500.0000 mg | ORAL_CAPSULE | Freq: Four times a day (QID) | ORAL | 0 refills | Status: AC

## 2020-12-22 MED ORDER — FENTANYL CITRATE (PF) 100 MCG/2ML IJ SOLN: 25.0000 ug | INTRAMUSCULAR | Status: DC | PRN

## 2020-12-22 MED ORDER — DEXAMETHASONE SODIUM PHOSPHATE 10 MG/ML IJ SOLN
INTRAMUSCULAR | Status: DC | PRN
  Administered 2020-12-22: 10 mg via INTRAVENOUS

## 2020-12-22 MED ORDER — MIDAZOLAM HCL 2 MG/2ML IJ SOLN
INTRAMUSCULAR | Status: AC
  Filled 2020-12-22: qty 2

## 2020-12-22 MED ORDER — ACETAMINOPHEN 10 MG/ML IV SOLN
INTRAVENOUS | Status: AC
  Filled 2020-12-22: qty 100

## 2020-12-22 MED ORDER — SURGIFLO WITH THROMBIN (HEMOSTATIC MATRIX KIT) OPTIME
TOPICAL | Status: DC | PRN
  Administered 2020-12-22: 1 via TOPICAL

## 2020-12-22 MED ORDER — OXYCODONE HCL 5 MG PO TABS
5.0000 mg | ORAL_TABLET | Freq: Once | ORAL | Status: AC | PRN
  Administered 2020-12-22: 5 mg via ORAL

## 2020-12-22 MED ORDER — DEXAMETHASONE SODIUM PHOSPHATE 10 MG/ML IJ SOLN
INTRAMUSCULAR | Status: AC
  Filled 2020-12-22: qty 1

## 2020-12-22 MED ORDER — SUCCINYLCHOLINE CHLORIDE 200 MG/10ML IV SOSY
PREFILLED_SYRINGE | INTRAVENOUS | Status: DC | PRN
  Administered 2020-12-22: 120 mg via INTRAVENOUS

## 2020-12-22 MED ORDER — OXYCODONE HCL 5 MG PO TABS: 5.0000 mg | ORAL_TABLET | Freq: Four times a day (QID) | ORAL | 0 refills | Status: DC | PRN

## 2020-12-22 MED ORDER — ACETAMINOPHEN 10 MG/ML IV SOLN
INTRAVENOUS | Status: DC | PRN
  Administered 2020-12-22: 1000 mg via INTRAVENOUS

## 2020-12-22 MED ORDER — REMIFENTANIL HCL 1 MG IV SOLR
INTRAVENOUS | Status: AC
  Filled 2020-12-22: qty 1000

## 2020-12-22 MED ORDER — PHENYLEPHRINE HCL (PRESSORS) 10 MG/ML IV SOLN
INTRAVENOUS | Status: AC
  Filled 2020-12-22: qty 1

## 2020-12-22 MED ORDER — FENTANYL CITRATE (PF) 100 MCG/2ML IJ SOLN
INTRAMUSCULAR | Status: DC | PRN
  Administered 2020-12-22: 100 ug via INTRAVENOUS

## 2020-12-22 MED ORDER — PROMETHAZINE HCL 25 MG/ML IJ SOLN: 6.2500 mg | INTRAMUSCULAR | Status: DC | PRN

## 2020-12-22 MED ORDER — PROPOFOL 10 MG/ML IV BOLUS
INTRAVENOUS | Status: DC | PRN
  Administered 2020-12-22: 150 mg via INTRAVENOUS

## 2020-12-22 MED ORDER — BUPIVACAINE-EPINEPHRINE (PF) 0.5% -1:200000 IJ SOLN
INTRAMUSCULAR | Status: DC | PRN
  Administered 2020-12-22: 16 mL

## 2020-12-22 MED ORDER — FENTANYL CITRATE (PF) 100 MCG/2ML IJ SOLN
INTRAMUSCULAR | Status: AC
  Filled 2020-12-22: qty 2

## 2020-12-22 MED ORDER — KETAMINE HCL 50 MG/5ML IJ SOSY
PREFILLED_SYRINGE | INTRAMUSCULAR | Status: AC
  Filled 2020-12-22: qty 5

## 2020-12-22 MED ORDER — ONDANSETRON HCL 4 MG/2ML IJ SOLN
INTRAMUSCULAR | Status: DC | PRN
  Administered 2020-12-22: 4 mg via INTRAVENOUS

## 2020-12-22 MED ORDER — BUPIVACAINE-EPINEPHRINE (PF) 0.5% -1:200000 IJ SOLN
INTRAMUSCULAR | Status: AC
  Filled 2020-12-22: qty 30

## 2020-12-22 SURGICAL SUPPLY — 86 items
ADH SKN CLS APL DERMABOND .7 (GAUZE/BANDAGES/DRESSINGS) ×1
AGENT HMST KT MTR STRL THRMB (HEMOSTASIS) ×1
APL PRP STRL LF DISP 70% ISPRP (MISCELLANEOUS) ×2
APL SRG 60D 8 XTD TIP BNDBL (TIP)
BLADE BOVIE TIP EXT 4 (BLADE) ×1 IMPLANT
BLADE CLIPPER SURG (BLADE) ×1 IMPLANT
BUR NEURO DRILL SOFT 3.0X3.8M (BURR) ×1 IMPLANT
CHLORAPREP W/TINT 26 (MISCELLANEOUS) ×3 IMPLANT
CNTNR SPEC 2.5X3XGRAD LEK (MISCELLANEOUS) ×1
CONT SPEC 4OZ STER OR WHT (MISCELLANEOUS) ×1
CONT SPEC 4OZ STRL OR WHT (MISCELLANEOUS) ×1
CONTAINER SPEC 2.5X3XGRAD LEK (MISCELLANEOUS) ×1 IMPLANT
COUNTER NEEDLE 20/40 LG (NEEDLE) ×2 IMPLANT
COVER LIGHT HANDLE STERIS (MISCELLANEOUS) ×4 IMPLANT
CUP MEDICINE 2OZ PLAST GRAD ST (MISCELLANEOUS) ×2 IMPLANT
DERMABOND ADVANCED (GAUZE/BANDAGES/DRESSINGS) ×1
DERMABOND ADVANCED .7 DNX12 (GAUZE/BANDAGES/DRESSINGS) ×1 IMPLANT
DRAPE C-ARM 42X72 X-RAY (DRAPES) ×3 IMPLANT
DRAPE C-ARMOR (DRAPES) ×1 IMPLANT
DRAPE LAPAROTOMY 100X77 ABD (DRAPES) ×2 IMPLANT
DRAPE SURG 17X11 SM STRL (DRAPES) ×2 IMPLANT
DRSG OPSITE POSTOP 4X6 (GAUZE/BANDAGES/DRESSINGS) ×2 IMPLANT
DRSG TEGADERM 2-3/8X2-3/4 SM (GAUZE/BANDAGES/DRESSINGS) ×1 IMPLANT
DRSG TEGADERM 4X4.75 (GAUZE/BANDAGES/DRESSINGS) ×1 IMPLANT
DURASEAL APPLICATOR TIP (TIP) IMPLANT
DURASEAL SPINE SEALANT 3ML (MISCELLANEOUS) IMPLANT
ELECT CAUTERY BLADE TIP 2.5 (TIP)
ELECT EZSTD 165MM 6.5IN (MISCELLANEOUS) ×2
ELECT REM PT RETURN 9FT ADLT (ELECTROSURGICAL) ×2
ELECTRODE CAUTERY BLDE TIP 2.5 (TIP) ×1 IMPLANT
ELECTRODE EZSTD 165MM 6.5IN (MISCELLANEOUS) ×1 IMPLANT
ELECTRODE REM PT RTRN 9FT ADLT (ELECTROSURGICAL) ×1 IMPLANT
FEE INTRAOP CADWELL SUPPLY NCS (MISCELLANEOUS) ×1 IMPLANT
FEE INTRAOP MONITOR IMPULS NCS (MISCELLANEOUS) IMPLANT
GAUZE 4X4 16PLY ~~LOC~~+RFID DBL (SPONGE) ×3 IMPLANT
GAUZE SPONGE 4X4 12PLY STRL (GAUZE/BANDAGES/DRESSINGS) IMPLANT
GLOVE SRG 8 PF TXTR STRL LF DI (GLOVE) ×1 IMPLANT
GLOVE SURG SYN 6.5 ES PF (GLOVE) ×4 IMPLANT
GLOVE SURG SYN 6.5 PF PI (GLOVE) ×2 IMPLANT
GLOVE SURG SYN 7.0 (GLOVE) ×4 IMPLANT
GLOVE SURG SYN 7.0 PF PI (GLOVE) ×2 IMPLANT
GLOVE SURG SYN 8.0 (GLOVE) ×2 IMPLANT
GLOVE SURG SYN 8.0 PF PI (GLOVE) ×1 IMPLANT
GLOVE SURG UNDER POLY LF SZ6.5 (GLOVE) ×4 IMPLANT
GLOVE SURG UNDER POLY LF SZ7 (GLOVE) ×2 IMPLANT
GLOVE SURG UNDER POLY LF SZ8 (GLOVE) ×2
GOWN SRG LRG LVL 4 IMPRV REINF (GOWNS) ×2 IMPLANT
GOWN STRL REIN LRG LVL4 (GOWNS) ×4
GOWN STRL REUS W/ TWL LRG LVL3 (GOWN DISPOSABLE) ×1 IMPLANT
GOWN STRL REUS W/ TWL XL LVL3 (GOWN DISPOSABLE) ×2 IMPLANT
GOWN STRL REUS W/TWL LRG LVL3 (GOWN DISPOSABLE) ×2
GOWN STRL REUS W/TWL XL LVL3 (GOWN DISPOSABLE) ×4
GRADUATE 1200CC STRL 31836 (MISCELLANEOUS) ×2 IMPLANT
INTRAOP CADWELL SUPPLY FEE NCS (MISCELLANEOUS) ×1
INTRAOP DISP SUPPLY FEE NCS (MISCELLANEOUS) ×2
INTRAOP MONITOR FEE IMPULS NCS (MISCELLANEOUS) ×1
INTRAOP MONITOR FEE IMPULSE (MISCELLANEOUS) ×1
KIT EXT SPINAL CORD STIMULATOR (Orthopedic Implant) ×2 IMPLANT
KIT TURNOVER KIT A (KITS) ×2 IMPLANT
KIT WILSON FRAME (KITS) ×1 IMPLANT
MANIFOLD NEPTUNE II (INSTRUMENTS) ×2 IMPLANT
MARKER SKIN DUAL TIP RULER LAB (MISCELLANEOUS) ×4 IMPLANT
NDL SAFETY ECLIPSE 18X1.5 (NEEDLE) ×1 IMPLANT
NEEDLE HYPO 18GX1.5 SHARP (NEEDLE) ×2
NEEDLE HYPO 22GX1.5 SAFETY (NEEDLE) ×2 IMPLANT
NS IRRIG 1000ML POUR BTL (IV SOLUTION) ×2 IMPLANT
PACK LAMINECTOMY NEURO (CUSTOM PROCEDURE TRAY) ×2 IMPLANT
PAD ARMBOARD 7.5X6 YLW CONV (MISCELLANEOUS) ×4 IMPLANT
SPONGE GAUZE 2X2 8PLY STRL LF (GAUZE/BANDAGES/DRESSINGS) ×1 IMPLANT
STAPLER SKIN PROX 35W (STAPLE) IMPLANT
STIMULATOR CORD SURESCAN MRI (Stimulator) ×1 IMPLANT
STIMULATOR WIRELESS 74X79X20MM (MISCELLANEOUS) ×1 IMPLANT
SURGIFLO W/THROMBIN 8M KIT (HEMOSTASIS) ×1 IMPLANT
SUT ETHILON 3-0 FS-10 30 BLK (SUTURE) ×4
SUT POLYSORB 2-0 5X18 GS-10 (SUTURE) ×7 IMPLANT
SUT SILK 2 0 SH (SUTURE) ×5 IMPLANT
SUT VIC AB 0 CT1 18XCR BRD 8 (SUTURE) ×2 IMPLANT
SUT VIC AB 0 CT1 8-18 (SUTURE) ×4
SUTURE EHLN 3-0 FS-10 30 BLK (SUTURE) ×2 IMPLANT
SYR 10ML LL (SYRINGE) ×4 IMPLANT
SYR 20ML LL LF (SYRINGE) ×2 IMPLANT
SYR 30ML LL (SYRINGE) ×4 IMPLANT
SYR 3ML LL SCALE MARK (SYRINGE) ×2 IMPLANT
TOWEL OR 17X26 4PK STRL BLUE (TOWEL DISPOSABLE) ×4 IMPLANT
TUBING CONNECTING 10 (TUBING) ×2 IMPLANT
WATER STERILE IRR 500ML POUR (IV SOLUTION) ×1 IMPLANT

## 2020-12-22 NOTE — Transfer of Care (Signed)
Immediate Anesthesia Transfer of Care Note  Patient: Jose Carroll  Procedure(s) Performed: Procedure(s): THORACIC LAMINECTOMY FOR SPINAL CORD STIMULATOR PADDLE TRIAL (MEDTRONIC) (N/A)  Patient Location: PACU  Anesthesia Type:General  Level of Consciousness: sedated  Airway & Oxygen Therapy: Patient Spontanous Breathing and Patient connected to face mask oxygen  Post-op Assessment: Report given to RN and Post -op Vital signs reviewed and stable  Post vital signs: Reviewed and stable  Last Vitals:  Vitals:   12/22/20 1325 12/22/20 1715  BP: (!) 140/94 (!) 153/89  Pulse: 85 72  Resp: 16 13  Temp: 37.1 C (!) 36.1 C  SpO2: 076% 226%    Complications: No apparent anesthesia complications

## 2020-12-22 NOTE — Anesthesia Preprocedure Evaluation (Addendum)
Anesthesia Evaluation  Patient identified by MRN, date of birth, ID band Patient awake    Reviewed: Allergy & Precautions, H&P , NPO status , Patient's Chart, lab work & pertinent test results  Airway Mallampati: II  TM Distance: >3 FB Neck ROM: Full    Dental  (+) Lower Dentures, Upper Dentures   Pulmonary shortness of breath and with exertion, COPD,  COPD inhaler, Current Smoker and Patient abstained from smoking., former smoker,    Pulmonary exam normal        Cardiovascular Exercise Tolerance: Good METS: 3 - Mets hypertension, Pt. on medications + angina (not occured >1 month) + CAD, + Cardiac Stents (2019) and + Peripheral Vascular Disease (h/o SUBCLAVIAN ARTERY STENT)  Normal cardiovascular exam  LHC 10/28/2017: EF 60%; 100% mRCA, 95% m-dLCx, 30% dLCx; PCI placing a 2.5 x 15 mm Resolute Onyx DES x1 to mLCx. Chronic RCA occlusion with L-R collaterals  Rollowed by cardiology Humphrey Rolls, MD). He was last seen in the cardiology clinic on 11/03/2020  TTE on 11/14/2020 that revealed normal left ventricular systolic function with a hyperdynamic LVEF of 84%.  Diastolic parameters consistent with abnormal relaxation (G1DD).  There was trace mitral valve regurgitation.  There was no evidence of a significant transvalvular gradient to suggest stenosis.  ECG: Date: 11/03/2020 Rate: 83 bpm Rhythm: normal sinus Axis (leads I and aVF): Normal Intervals: PR 138 ms. QRS 100 ms. QTc 460 ms. ST segment and T wave changes: No evidence of acute ST segment elevation or depression Comparison: Previous tracing performed on 10/29/2017 showed ST and T wave abnormalities in the inferior and anterolateral leads; resolved on current tracing.    Neuro/Psych S/p lumbar fusion with failed back syndrome and chronic pain with Chronic, continuous use of opioids negative neurological ROS     GI/Hepatic GERD  Controlled and Medicated,  Endo/Other  diabetes  (recent A1C 8.4), Poorly Controlled, Type 2, Oral Hypoglycemic Agents  Renal/GU CRFRenal disease     Musculoskeletal  (+) Arthritis ,   Abdominal   Peds  Hematology  (+) Blood dyscrasia (history of thrombocytopenia), ,   Anesthesia Other Findings   Reproductive/Obstetrics                          Anesthesia Physical  Anesthesia Plan  ASA: III  Anesthesia Plan: General   Post-op Pain Management:    Induction: Intravenous  PONV Risk Score and Plan: 2 and Ondansetron and Dexamethasone  Airway Management Planned: Oral ETT  Additional Equipment:   Intra-op Plan:   Post-operative Plan: Extubation in OR  Informed Consent: I have reviewed the patients History and Physical, chart, labs and discussed the procedure including the risks, benefits and alternatives for the proposed anesthesia with the patient or authorized representative who has indicated his/her understanding and acceptance.     Dental advisory given  Plan Discussed with: CRNA and Anesthesiologist  Anesthesia Plan Comments:        Anesthesia Quick Evaluation

## 2020-12-22 NOTE — Interval H&P Note (Signed)
History and Physical Interval Note:  12/22/2020 2:27 PM  Jose Carroll  has presented today for surgery, with the diagnosis of g89.4 chronic pain.  The various methods of treatment have been discussed with the patient and family. After consideration of risks, benefits and other options for treatment, the patient has consented to  Procedure(s): THORACIC LAMINECTOMY FOR SPINAL CORD STIMULATOR PADDLE TRIAL (MEDTRONIC) (N/A) as a surgical intervention.  The patient's history has been reviewed, patient examined, no change in status, stable for surgery.  I have reviewed the patient's chart and labs.  Questions were answered to the patient's satisfaction.     Deetta Perla

## 2020-12-22 NOTE — Op Note (Signed)
Operative Note   SURGERY DATE:  12/22/2020   PRE-OP DIAGNOSIS:  Chronic pain syndrome   POST-OP DIAGNOSIS: Post-Op Diagnosis Codes: Chronic pain syndrome   Procedure(s) with comments: Thoracic Laminectomy with SCS Paddle Placement Extension Lead Attachment for Trial   SURGEON:     * Malen Gauze, MD       Cooper Render, PA Assistant   ANESTHESIA: General    OPERATIVE FINDINGS: Successful placement of thoracic spinal cord stimulator paddle trial   Indication Mr Olivencia was seen in clinic on 11/29 after having a failed percutaneous spinal cord stimulator trial for chronic pain. MRI of the spine revealed no concerning stenosis at the level of the implant but he had had an L3-S1 fusion previously in the lumbar spine.  The patient wished to proceed with a laminectomy for paddle trial to decrease medication use and achieve better pain control. Risks including damage to spinal cord, weakness, hematoma, infection, failure of pain relief, post-operative pain, need for revision, stroke, heart attack, pneumonia, and spinal cord injury were discussed.    Procedure The patient was brought to the operating room where vascular access was obtained and intubated by the anesthesia service. Neuromonitoring electrodes were placed for SSEP and MEP with good baselines obtained. Antibiotics were given.  The patient was turned prone onto a Wilson frame. Fluoroscopy was used to confirm planned incision in thoracic area. The patient was prepped and draped in a sterile fashion. A hard time out was performed. Local anesthetic was instilled into incisions.   The thoracic incision was opened sharply over the midline and continued through the fascia to the level of the T9-10 lamina. The lamina above was exposed and fluoroscopy was used to confirm the T9-10 interspace. Next, a combination of rongeurs and drill were used to remove the caudal half of the T9 lamina along with underlying ligament to expose the dura.  The opening was widened to allow for paddle placement. The Medtronic paddle was inserted gently in the rostral direction placing it in the midline of the exposure. Fluoroscopy was used to confirm midline placement and levels with the tip of the paddle resting at the T8 vertebral body.  The leads were then secured to the muscle with a strain relief using silk suture.   The right flank incision was opened sharply down to level of fascia and then expanded to allow for placement of a battery if needed. The leads were then tunneled from the thoracic incision to the flank. There, the leads were connected to the extension wires.  The wires were interrogated at this time and impedence found to be adequate.  Next, the extension wires were tunneled to an exit site in the left flank.  There were connected to a battery externally.  Monitoring confirmed that MEP's and SSEPs remained stable throughout the case.   Hemostasis was achieved. Each incision was irrigated with saline and vancomycin powder placed.   A final fluoroscopic image was taken show good placement of paddle. Then, each incision was closed with combination of 0, 2-0 vicryls. The skin was closed with 3-0 Nylon in the thoracic area and Dermabond on flank.  The exiting wires were secured with a nylon.  Sterile dressings were applied. The patient was returned to supine position and extubated. The patient was seen to be moving all extremities symmetrically and was taken to PACU for recovery. The family was updated and all questions answered.   ESTIMATED BLOOD LOSS:   50 cc   SPECIMENS None  IMPLANT STIMULATOR CORD SURESCAN MRI - [id number]  Inventory Item: STIMULATOR CORD SURESCAN MRI Serial no.: [id number] Model/Cat no.: [id number]  Implant name: STIMULATOR CORD SURESCAN MRI - [id number] Laterality: N/A Area: Spine Thoracic  Manufacturer: MEDTRONIC NEUROMOD PAIN MGMT Date of Manufacture:    Action: Implanted Number Used: 1   Device Identifier:   Device Identifier Type:     KIT EXT SPINAL CORD STIMULATOR - [id number]  Inventory Item: KIT EXT SPINAL CORD STIMULATOR Serial no.: [id number] Model/Cat no.: [id number]  Implant name: KIT EXT SPINAL CORD STIMULATOR - [id number] Laterality: N/A Area: Spine Thoracic  Manufacturer: MEDTRONIC NEUROMOD PAIN MGMT Date of Manufacture:    Action: Implanted Number Used: 1   Device Identifier:  Device Identifier Type:     KIT EXT SPINAL CORD STIMULATOR - [id number]  Inventory Item: KIT EXT SPINAL CORD STIMULATOR Serial no.: [id number] Model/Cat no.: [id number]  Implant name: KIT EXT SPINAL CORD STIMULATOR - [id number] Laterality: N/A Area: Spine Thoracic  Manufacturer: MEDTRONIC NEUROMOD PAIN MGMT Date of Manufacture:    Action: Implanted Number Used: 1   Device Identifier:  Device Identifier Type:         I performed the case in its entirety with assistance of PA, Ernestene Kiel, [place]

## 2020-12-22 NOTE — Progress Notes (Signed)
Pt ambulating in PACU patient tolerated well, states he feels good.

## 2020-12-22 NOTE — Anesthesia Procedure Notes (Signed)
Procedure Name: Intubation Date/Time: 12/22/2020 3:19 PM Performed by: Lia Foyer, CRNA Pre-anesthesia Checklist: Patient identified, Emergency Drugs available, Suction available and Patient being monitored Patient Re-evaluated:Patient Re-evaluated prior to induction Oxygen Delivery Method: Circle system utilized Preoxygenation: Pre-oxygenation with 100% oxygen Induction Type: IV induction Ventilation: Mask ventilation without difficulty Laryngoscope Size: McGraph and 4 Grade View: Grade I Tube type: Oral Tube size: 7.5 mm Number of attempts: 1 Airway Equipment and Method: Stylet, Video-laryngoscopy and Oral airway Placement Confirmation: ETT inserted through vocal cords under direct vision, positive ETCO2 and breath sounds checked- equal and bilateral Secured at: 22 cm Tube secured with: Tape Dental Injury: Teeth and Oropharynx as per pre-operative assessment

## 2020-12-22 NOTE — H&P (Signed)
Jose Carroll is an 55 y.o. male.   Chief Complaint: Chronic pain HPI:  Jose Carroll is here for discussion of ongoing chronic back and leg pain. He has undergone multiple lumbar surgeries ultimately resulting in L3-S1 fusion and decompression, last surgery was in 2014. He states he continues to have chronic pain in the back and legs going down the posterior side to the calf. He denies any numbness or weakness. He does feel that the right leg is worse than the left but the pain is bilateral. He is continuing with medication but is having side effects from this. He has been followed by the pain clinic was referred for spinal cord stimulator.  He underwent an attempted a percutaneous spinal cord stimulator trial recently but was unable to tolerate it as he had increased pain and paresthesias. He has had an MRI of the lumbar and thoracic spine and is here for the SCS paddle trial.  He is diabetic and currently on insulin. He additionally does take a blood thinner.  Past Medical History:  Diagnosis Date   Anginal pain (HCC)    Arthritis    Chronic pain    Chronic, continuous use of opioids    CKD (chronic kidney disease)    Claudication of both lower extremities (HCC)    COPD (chronic obstructive pulmonary disease) (HCC)    Coronary artery disease    a.) LHC 09/10/2014: 50% mLCx, 50% mLAD, 100% p-m RCA; good collaterals from LAD; med mgmt. b.) LHC 06/26/2015: 100% mRCA, 30% dLCx, 40% dLAD; good collaterals from LAD/LCx; med mgmt. c.) LHC 10/28/2017: EF 60%; 100% mRCA, 95% m-dLCx, 30% dLCx; PCI placing a 2.5 x 15 mm Resolute Onyx DES x1 to mLCx. Chronic RCA occlusion with L-R collaterals.   Erectile dysfunction    GERD (gastroesophageal reflux disease)    HLD (hyperlipidemia)    Hypertension    Long term current use of antithrombotics/antiplatelets    a.) ASA + ticagrelor   Peripheral edema    PVD (peripheral vascular disease) (HCC)    Shortness of breath    T2DM (type 2 diabetes  mellitus) ([place])    Vitamin D deficiency     Past Surgical History:  Procedure Laterality Date   BACK SURGERY     4   CARDIAC CATHETERIZATION N/A 09/10/2014   Procedure: Left Heart Cath and Coronary Angiogram ;  Surgeon: Dionisio David, MD;  Location: [facility];  Service: Cardiovascular;  Laterality: N/A;   CARDIAC CATHETERIZATION Left 06/26/2015   Procedure: Left Heart Cath and Coronary Angiography;  Surgeon: Dionisio David, MD;  Location: [facility];  Service: Cardiovascular;  Laterality: Left;   CORONARY STENT INTERVENTION N/A 10/28/2017   Procedure: CORONARY STENT INTERVENTION (2.5 x 15 mm Resolute Onyx DES x 1 to mLCx);  Surgeon: Wellington Hampshire, MD;  Location: [facility];  Service: Cardiovascular;  Laterality: N/A;   FETAL SURGERY FOR CONGENITAL HERNIA  2012   FINGER SURGERY     sixth finger removed   HERNIA REPAIR     LEFT HEART CATH AND CORONARY ANGIOGRAPHY Left 10/28/2017   Procedure: LEFT HEART CATH AND CORONARY ANGIOGRAPHY;  Surgeon: Dionisio David, MD;  Location: [facility];  Service: Cardiovascular;  Laterality: Left;   SUBCLAVIAN ARTERY STENT     TONSILLECTOMY      Family History  Problem Relation Age of Onset   Cancer Mother    Diabetes Mother    Anuerysm Father  Social History:  reports that he quit smoking about 5 years ago. His smoking use included cigarettes. He has a 25.00 pack-year smoking history. He quit smokeless tobacco use about 5 years ago. He reports that he does not drink alcohol and does not use drugs.  Allergies:  Allergies  Allergen Reactions   Naproxen Rash    Medications Prior to Admission  Medication Sig Dispense Refill   albuterol (PROVENTIL HFA;VENTOLIN HFA) 108 (90 BASE) MCG/ACT inhaler Inhale 2 puffs into the lungs every 6 (six) hours as needed for wheezing or shortness of breath.      aspirin EC 81 MG tablet Take 81 mg by mouth daily.     atorvastatin (LIPITOR) 80 MG tablet Take 80 mg by  mouth daily.     budesonide (PULMICORT) 0.25 MG/2ML nebulizer solution Take 0.25 mg by nebulization 2 (two) times daily.     cetirizine (ZYRTEC) 10 MG tablet Take 10 mg by mouth at bedtime.     empagliflozin (JARDIANCE) 25 MG TABS tablet Take 25 mg by mouth in the morning.     formoterol (PERFOROMIST) 20 MCG/2ML nebulizer solution Take 20 mcg by nebulization 2 (two) times daily.     furosemide (LASIX) 40 MG tablet Take 40 mg by mouth daily as needed for edema.     gabapentin (NEURONTIN) 600 MG tablet Take 1,200 mg by mouth 3 (three) times daily.      glipiZIDE (GLUCOTROL XL) 5 MG 24 hr tablet Take 5 mg by mouth in the morning and at bedtime.     icosapent Ethyl (VASCEPA) 1 g capsule Take 1 g by mouth 2 (two) times daily.     insulin degludec (TRESIBA) 100 UNIT/ML FlexTouch Pen Inject 38 Units into the skin at bedtime.     isosorbide mononitrate (IMDUR) 120 MG 24 hr tablet Take 120 mg by mouth in the morning and at bedtime.     lubiprostone (AMITIZA) 24 MCG capsule Take 24 mcg by mouth daily with breakfast.     metoprolol succinate (TOPROL-XL) 100 MG 24 hr tablet Take 100 mg by mouth daily. Take with or immediately following a meal.     Multiple Vitamin (MULTIVITAMIN WITH MINERALS) TABS tablet Take 1 tablet by mouth daily.     nitroGLYCERIN (NITROSTAT) 0.4 MG SL tablet Place 0.4 mg under the tongue every 5 (five) minutes as needed for chest pain.     oxyCODONE ER 9 MG C12A Take 9 mg by mouth every 12 (twelve) hours.      pantoprazole (PROTONIX) 40 MG tablet Take 40 mg by mouth daily before breakfast.     ranolazine (RANEXA) 1000 MG SR tablet Take 1,000 mg by mouth 2 (two) times daily.     tamsulosin (FLOMAX) 0.4 MG CAPS capsule Take 0.4 mg by mouth daily.     ticagrelor (BRILINTA) 60 MG TABS tablet Take 60 mg by mouth 2 (two) times daily.     tiZANidine (ZANAFLEX) 4 MG tablet Take 4 mg by mouth in the morning, at noon, in the evening, and at bedtime.     TRULICITY 5.46 FK/8.1EX SOPN Inject 0.75  mg into the skin once a week.      Results for orders placed or performed during the hospital encounter of 12/22/20 (from the past 48 hour(s))  Urinalysis, Routine w reflex microscopic     Status: Abnormal   Collection Time: 12/22/20 12:56 PM  Result Value Ref Range   Color, Urine YELLOW (A) YELLOW   APPearance HAZY (A) CLEAR  Specific Gravity, Urine 1.026 1.005 - 1.030   pH 6.0 5.0 - 8.0   Glucose, UA >=500 (A) NEGATIVE mg/dL   Hgb urine dipstick NEGATIVE NEGATIVE   Bilirubin Urine NEGATIVE NEGATIVE   Ketones, ur NEGATIVE NEGATIVE mg/dL   Protein, ur NEGATIVE NEGATIVE mg/dL   Nitrite NEGATIVE NEGATIVE   Leukocytes,Ua TRACE (A) NEGATIVE   RBC / HPF 0-5 0 - 5 RBC/hpf   WBC, UA 11-20 0 - 5 WBC/hpf   Bacteria, UA RARE (A) NONE SEEN   Squamous Epithelial / LPF 0-5 0 - 5   Mucus PRESENT     Comment: Performed at [facility], [address] [place], [place] [address]  Glucose, capillary     Status: Abnormal   Collection Time: 12/22/20  1:01 PM  Result Value Ref Range   Glucose-Capillary 111 (H) 70 - 99 mg/dL    Comment: Glucose reference range applies only to samples taken after fasting for at least 8 hours.  APTT     Status: Abnormal   Collection Time: 12/22/20  1:11 PM  Result Value Ref Range   aPTT 41 (H) 24 - 36 seconds    Comment:        IF BASELINE aPTT IS ELEVATED, SUGGEST PATIENT RISK ASSESSMENT BE USED TO DETERMINE APPROPRIATE ANTICOAGULANT THERAPY. Performed at [facility], [address] [place], [place] [address]   Protime-INR     Status: None   Collection Time: 12/22/20  1:11 PM  Result Value Ref Range   Prothrombin Time 13.6 11.4 - 15.2 seconds   INR 1.0 0.8 - 1.2    Comment: (NOTE) INR goal varies based on device and disease states. Performed at [facility], [address] [place], [place] [address]   Basic metabolic panel     Status: Abnormal   Collection Time: 12/22/20  1:11 PM  Result Value Ref Range   Sodium 140  135 - 145 mmol/L   Potassium 3.4 (L) 3.5 - 5.1 mmol/L   Chloride 103 98 - 111 mmol/L   CO2 30 22 - 32 mmol/L   Glucose, Bld 112 (H) 70 - 99 mg/dL    Comment: Glucose reference range applies only to samples taken after fasting for at least 8 hours.   BUN 9 6 - 20 mg/dL   Creatinine, Ser 1.10 0.61 - 1.24 mg/dL   Calcium 9.7 8.9 - 10.3 mg/dL   GFR, Estimated >60 >60 mL/min    Comment: (NOTE) Calculated using the CKD-EPI Creatinine Equation (2021)    Anion gap 7 5 - 15    Comment: Performed at [facility], [address] [place], [place] [address]   No results found.  Review of Systems General ROS: Negative Psychological ROS: Negative Ophthalmic ROS: Negative ENT ROS: Negative Hematological and Lymphatic ROS: Negative  Endocrine ROS: Negative Respiratory ROS: Negative Cardiovascular ROS: Negative Gastrointestinal ROS: Negative Genito-Urinary ROS: Negative Musculoskeletal ROS: Positive for back pain Neurological ROS: Positive for leg pain Dermatological ROS: Negative Blood pressure (!) 140/94, pulse 85, temperature 98.8 F (37.1 C), temperature source Oral, resp. rate 16, height 5\' 10"  (1.778 m), weight 91.2 kg, SpO2 100 %. Physical Exam  General appearance: Alert, cooperative, in no acute distress Head: Normocephalic, atraumatic Eyes: Normal, EOM intact Oropharynx: Moist without lesions Back: Well-healed midline incision Ext: No edema in LE bilaterally  Neurologic exam:  Mental status: alertness: alert, affect: normal Speech: fluent and clear Motor:strength symmetric 5/5, normal muscle mass in bilateral lower extremities Sensory: intact  to light touch in bilateral lower extremities Gait: normal   Imaging: MRI thoracic spine: There is a normal kyphotic curvature. There is no significant central stenosis in the mid to upper thoracic spine but at T10-11 there is facet hypertrophy which results in compression of the thecal sac and narrowing of the canal. At T9-10  there is some milder right-sided stenosis  MRI lumbar spine: There is a previous L3-S1 fusion and decompression. The alignment appears maintained. There is a lordotic curvature. There is no significant central foraminal stenosis noted.   Assessment/Plan Proceed with thoracic SCS  Deetta Perla, MD 12/22/2020, 1:58 PM

## 2020-12-22 NOTE — Discharge Summary (Signed)
Physician Discharge Summary  Patient ID: Jose Carroll MRN: [id number] DOB/AGE: Aug 06, 1965 55 y.o.  Admit date: 12/22/2020 Discharge date: 12/22/2020  Admission Diagnoses: chronic pain syndrome   Discharge Diagnoses:  Active Problems:   * No active hospital problems. *   Discharged Condition: good  Hospital Course:  Jose Carroll is a 55 y.o presenting with a history of chronic pain. He underwent a placement of a SCS trial paddle on 12/22/20. His interoperative course was uncomplicated and he was monitored post-operatively in the PACU. He was discharged home after ambulating, urinating, and tolerating PO intake. He was given prescriptions for Keflex for infection prophylaxis and Oxycodone for breakthrough pain.   Consults: None  Significant Diagnostic Studies: none  Treatments: surgery: as above. Please see separately dictated operative report for further details  Discharge Exam: Blood pressure (!) 140/94, pulse 85, temperature 98.8 F (37.1 C), temperature source Oral, resp. rate 16, height 5\' 10"  (1.778 m), weight 91.2 kg, SpO2 100 %. CN II-XII grossly intact 5/5 strength throughout BLE Incisions c/d/i  Disposition: Discharge disposition: 01-Home or Self Care        Allergies as of 12/22/2020       Reactions   Naproxen Rash        Medication List     STOP taking these medications    aspirin EC 81 MG tablet       TAKE these medications    albuterol 108 (90 Base) MCG/ACT inhaler Commonly known as: VENTOLIN HFA Inhale 2 puffs into the lungs every 6 (six) hours as needed for wheezing or shortness of breath.   atorvastatin 80 MG tablet Commonly known as: LIPITOR Take 80 mg by mouth daily.   budesonide 0.25 MG/2ML nebulizer solution Commonly known as: PULMICORT Take 0.25 mg by nebulization 2 (two) times daily.   cephALEXin 500 MG capsule Commonly known as: Keflex Take 1 capsule (500 mg total) by mouth 4 (four) times daily for 7 days.    cetirizine 10 MG tablet Commonly known as: ZYRTEC Take 10 mg by mouth at bedtime.   empagliflozin 25 MG Tabs tablet Commonly known as: JARDIANCE Take 25 mg by mouth in the morning.   formoterol 20 MCG/2ML nebulizer solution Commonly known as: PERFOROMIST Take 20 mcg by nebulization 2 (two) times daily.   furosemide 40 MG tablet Commonly known as: LASIX Take 40 mg by mouth daily as needed for edema.   gabapentin 600 MG tablet Commonly known as: NEURONTIN Take 1,200 mg by mouth 3 (three) times daily.   glipiZIDE 5 MG 24 hr tablet Commonly known as: GLUCOTROL XL Take 5 mg by mouth in the morning and at bedtime.   icosapent Ethyl 1 g capsule Commonly known as: VASCEPA Take 1 g by mouth 2 (two) times daily.   insulin degludec 100 UNIT/ML FlexTouch Pen Commonly known as: TRESIBA Inject 38 Units into the skin at bedtime.   isosorbide mononitrate 120 MG 24 hr tablet Commonly known as: IMDUR Take 120 mg by mouth in the morning and at bedtime.   lubiprostone 24 MCG capsule Commonly known as: AMITIZA Take 24 mcg by mouth daily with breakfast.   metoprolol succinate 100 MG 24 hr tablet Commonly known as: TOPROL-XL Take 100 mg by mouth daily. Take with or immediately following a meal.   multivitamin with minerals Tabs tablet Take 1 tablet by mouth daily.   nitroGLYCERIN 0.4 MG SL tablet Commonly known as: NITROSTAT Place 0.4 mg under the tongue every 5 (five) minutes as needed for  chest pain.   oxyCODONE 5 MG immediate release tablet Commonly known as: Roxicodone Take 1 tablet (5 mg total) by mouth every 6 (six) hours as needed for up to 7 days for severe pain (pain refractory to home Oxycodone).   oxyCODONE ER 9 MG C12a Take 9 mg by mouth every 12 (twelve) hours.   pantoprazole 40 MG tablet Commonly known as: PROTONIX Take 40 mg by mouth daily before breakfast.   ranolazine 1000 MG SR tablet Commonly known as: RANEXA Take 1,000 mg by mouth 2 (two) times daily.    tamsulosin 0.4 MG Caps capsule Commonly known as: FLOMAX Take 0.4 mg by mouth daily.   ticagrelor 60 MG Tabs tablet Commonly known as: BRILINTA Take 60 mg by mouth 2 (two) times daily.   tiZANidine 4 MG tablet Commonly known as: ZANAFLEX Take 4 mg by mouth in the morning, at noon, in the evening, and at bedtime.   Trulicity 6.07 PX/1.0GY Sopn Generic drug: Dulaglutide Inject 0.75 mg into the skin once a week.        Follow-up Information     Loleta Dicker, PA Follow up in 1 week(s).   Contact information: [place] [address] [phone]                 Signed: Loleta Dicker 12/22/2020, 5:03 PM

## 2020-12-22 NOTE — Anesthesia Postprocedure Evaluation (Signed)
Anesthesia Post Note  Patient: BECKHAM CAPISTRAN  Procedure(s) Performed: THORACIC LAMINECTOMY FOR SPINAL CORD STIMULATOR PADDLE TRIAL (MEDTRONIC) (Back)  Patient location during evaluation: PACU Anesthesia Type: General Level of consciousness: awake and alert Pain management: pain level controlled Vital Signs Assessment: post-procedure vital signs reviewed and stable Respiratory status: spontaneous breathing, nonlabored ventilation, respiratory function stable and patient connected to nasal cannula oxygen Cardiovascular status: blood pressure returned to baseline and stable Postop Assessment: no apparent nausea or vomiting Anesthetic complications: no   No notable events documented.   Last Vitals:  Vitals:   12/22/20 1730 12/22/20 1745  BP: (!) 150/92 (!) 163/97  Pulse: 73 72  Resp:  14  Temp:    SpO2: 100% 98%    Last Pain:  Vitals:   12/22/20 1715  TempSrc: Temporal  PainSc: Asleep                 Arita Miss

## 2020-12-22 NOTE — Progress Notes (Signed)
Pharmacy Antibiotic Note  Jose Carroll is a 55 y.o. male admitted on (Not on file) with surgical prophylaxis.  Pharmacy has been consulted for Cefazolin dosing.  Plan: TBW = 91.2 kg  Cefazolin 2 gm IV X 1 60 min pre-op ordered for 12/12 @ 0500.      No data recorded.  No results for input(s): WBC, CREATININE, LATICACIDVEN, VANCOTROUGH, VANCOPEAK, VANCORANDOM, GENTTROUGH, GENTPEAK, GENTRANDOM, TOBRATROUGH, TOBRAPEAK, TOBRARND, AMIKACINPEAK, AMIKACINTROU, AMIKACIN in the last 168 hours.  CrCl cannot be calculated (Patient's most recent lab result is older than the maximum 21 days allowed.).    Allergies  Allergen Reactions   Naproxen Rash    Antimicrobials this admission:   >>    >>   Dose adjustments this admission:   Microbiology results:  BCx:   UCx:    Sputum:    MRSA PCR:   Thank you for allowing pharmacy to be a part of this patient's care.  Prestina Raigoza D 12/22/2020 1:17 AM

## 2020-12-22 NOTE — Discharge Instructions (Addendum)
NEUROSURGERY DISCHARGE INSTRUCTIONS  Admission diagnosis: g89.4 chronic pain  Operative procedure: Placement of SCS trial  What to do after you leave the hospital:  Recommended diet: regular diet. Increase protein intake to promote wound healing.  Hold home Aspirin for the next 7 days.   Please take Keflex 500mg  four times per day during trial for infection prophylaxis.   Special Instructions   Keep incision area clean and dry. No showering during trial. Do not remove bandages   Please take pain medications as directed. Take a stool softener if on pain medications   Please Report any of the following: Nausea or Vomiting, Temperature is greater than 101.90F (38.1C) degrees, Dizziness, Abdominal Pain, Difficulty Breathing or Shortness of Breath, Inability to Eat, drink Fluids, or Take medications, Bleeding, swelling, or drainage from surgical incision sites, New numbness or weakness, and Bowel or bladder dysfunction to the neurosurgeon on call at [phone]  Additional Follow up appointments Please follow up with Dr Lacinda Axon in [facility] as scheduled in 1 week   Please see below for scheduled appointments:  No future appointments.   AMBULATORY SURGERY  DISCHARGE INSTRUCTIONS   The drugs that you were given will stay in your system until tomorrow so for the next 24 hours you should not:  Drive an automobile Make any legal decisions Drink any alcoholic beverage   You may resume regular meals tomorrow.  Today it is better to start with liquids and gradually work up to solid foods.  You may eat anything you prefer, but it is better to start with liquids, then soup and crackers, and gradually work up to solid foods.   Please notify your doctor immediately if you have any unusual bleeding, trouble breathing, redness and pain at the surgery site, drainage, fever, or pain not relieved by medication.    Additional Instructions:        Please contact your physician  with any problems or Same Day Surgery at [phone], Monday through Friday 6 am to 4 pm, or [place] at [facility] number at [phone].

## 2020-12-23 ENCOUNTER — Encounter: Payer: Self-pay | Admitting: Neurosurgery

## 2020-12-29 ENCOUNTER — Encounter: Payer: Self-pay | Admitting: Neurosurgery

## 2020-12-29 ENCOUNTER — Encounter
Admission: RE | Disposition: A | Discharge status: Home / Self Care | Payer: Self-pay | Source: Ambulatory Visit | Attending: Neurosurgery

## 2020-12-29 ENCOUNTER — Other Ambulatory Visit: Payer: Self-pay

## 2020-12-29 ENCOUNTER — Ambulatory Visit: Payer: Medicare HMO | Admitting: Urgent Care

## 2020-12-29 ENCOUNTER — Ambulatory Visit
Admission: RE | Admit: 2020-12-29 | Discharge: 2020-12-29 | Disposition: A | Discharge status: Home / Self Care | Payer: Medicare HMO | Source: Ambulatory Visit | Attending: Neurosurgery | Admitting: Neurosurgery

## 2020-12-29 DIAGNOSIS — N189 Chronic kidney disease, unspecified: Secondary | ICD-10-CM | POA: Diagnosis not present

## 2020-12-29 DIAGNOSIS — K219 Gastro-esophageal reflux disease without esophagitis: Secondary | ICD-10-CM | POA: Diagnosis not present

## 2020-12-29 DIAGNOSIS — Z7984 Long term (current) use of oral hypoglycemic drugs: Secondary | ICD-10-CM | POA: Insufficient documentation

## 2020-12-29 DIAGNOSIS — I129 Hypertensive chronic kidney disease with stage 1 through stage 4 chronic kidney disease, or unspecified chronic kidney disease: Secondary | ICD-10-CM | POA: Insufficient documentation

## 2020-12-29 DIAGNOSIS — G894 Chronic pain syndrome: Secondary | ICD-10-CM | POA: Insufficient documentation

## 2020-12-29 DIAGNOSIS — Z87891 Personal history of nicotine dependence: Secondary | ICD-10-CM | POA: Diagnosis not present

## 2020-12-29 DIAGNOSIS — Z955 Presence of coronary angioplasty implant and graft: Secondary | ICD-10-CM | POA: Diagnosis not present

## 2020-12-29 DIAGNOSIS — E1151 Type 2 diabetes mellitus with diabetic peripheral angiopathy without gangrene: Secondary | ICD-10-CM | POA: Diagnosis not present

## 2020-12-29 DIAGNOSIS — E1122 Type 2 diabetes mellitus with diabetic chronic kidney disease: Secondary | ICD-10-CM | POA: Diagnosis not present

## 2020-12-29 HISTORY — PX: PULSE GENERATOR IMPLANT: SHX5370

## 2020-12-29 LAB — GLUCOSE, CAPILLARY
Glucose-Capillary: 180 mg/dL — ABNORMAL HIGH (ref 70–99)
Glucose-Capillary: 170 mg/dL — ABNORMAL HIGH (ref 70–99)

## 2020-12-29 SURGERY — UNILATERAL PULSE GENERATOR IMPLANT
Anesthesia: Monitor Anesthesia Care | Site: Thoracic

## 2020-12-29 MED ORDER — FENTANYL CITRATE (PF) 100 MCG/2ML IJ SOLN: 25.0000 ug | INTRAMUSCULAR | Status: DC | PRN

## 2020-12-29 MED ORDER — KETAMINE HCL 50 MG/5ML IJ SOSY
PREFILLED_SYRINGE | INTRAMUSCULAR | Status: AC
  Filled 2020-12-29: qty 5

## 2020-12-29 MED ORDER — ONDANSETRON HCL 4 MG/2ML IJ SOLN: 4.0000 mg | Freq: Once | INTRAMUSCULAR | Status: DC | PRN

## 2020-12-29 MED ORDER — IPRATROPIUM-ALBUTEROL 0.5-2.5 (3) MG/3ML IN SOLN: 3.0000 mL | Freq: Once | RESPIRATORY_TRACT | Status: AC

## 2020-12-29 MED ORDER — CEFAZOLIN SODIUM-DEXTROSE 2-4 GM/100ML-% IV SOLN
INTRAVENOUS | Status: AC
  Filled 2020-12-29: qty 100

## 2020-12-29 MED ORDER — CHLORHEXIDINE GLUCONATE 0.12 % MT SOLN
OROMUCOSAL | Status: AC
  Administered 2020-12-29: 11:00:00 15 mL via OROMUCOSAL
  Filled 2020-12-29: qty 15

## 2020-12-29 MED ORDER — ACETAMINOPHEN 10 MG/ML IV SOLN
INTRAVENOUS | Status: AC
  Filled 2020-12-29: qty 100

## 2020-12-29 MED ORDER — CEFAZOLIN SODIUM-DEXTROSE 2-4 GM/100ML-% IV SOLN
2.0000 g | Freq: Once | INTRAVENOUS | Status: AC
  Administered 2020-12-29: 12:00:00 2 g via INTRAVENOUS

## 2020-12-29 MED ORDER — VANCOMYCIN HCL 1 G IV SOLR
INTRAVENOUS | Status: DC | PRN
  Administered 2020-12-29: 1000 mg

## 2020-12-29 MED ORDER — PROPOFOL 1000 MG/100ML IV EMUL
INTRAVENOUS | Status: AC
  Filled 2020-12-29: qty 100

## 2020-12-29 MED ORDER — EPHEDRINE 5 MG/ML INJ
INTRAVENOUS | Status: AC
  Filled 2020-12-29: qty 5

## 2020-12-29 MED ORDER — FENTANYL CITRATE (PF) 100 MCG/2ML IJ SOLN
INTRAMUSCULAR | Status: DC | PRN
  Administered 2020-12-29 (×4): 25 ug via INTRAVENOUS

## 2020-12-29 MED ORDER — FENTANYL CITRATE (PF) 100 MCG/2ML IJ SOLN
INTRAMUSCULAR | Status: AC
  Filled 2020-12-29: qty 2

## 2020-12-29 MED ORDER — IPRATROPIUM-ALBUTEROL 0.5-2.5 (3) MG/3ML IN SOLN
RESPIRATORY_TRACT | Status: AC
  Administered 2020-12-29: 11:00:00 3 mL via RESPIRATORY_TRACT
  Filled 2020-12-29: qty 3

## 2020-12-29 MED ORDER — MIDAZOLAM HCL 2 MG/2ML IJ SOLN
INTRAMUSCULAR | Status: DC | PRN
  Administered 2020-12-29: 2 mg via INTRAVENOUS

## 2020-12-29 MED ORDER — ORAL CARE MOUTH RINSE: 15.0000 mL | Freq: Once | OROMUCOSAL | Status: AC

## 2020-12-29 MED ORDER — LIDOCAINE HCL (PF) 2 % IJ SOLN
INTRAMUSCULAR | Status: AC
  Filled 2020-12-29: qty 5

## 2020-12-29 MED ORDER — OXYCODONE HCL 5 MG PO TABS: 5.0000 mg | ORAL_TABLET | Freq: Four times a day (QID) | ORAL | 0 refills | Status: AC | PRN

## 2020-12-29 MED ORDER — ACETAMINOPHEN 10 MG/ML IV SOLN
INTRAVENOUS | Status: DC | PRN
  Administered 2020-12-29: 1000 mg via INTRAVENOUS

## 2020-12-29 MED ORDER — OXYCODONE HCL 5 MG PO TABS
5.0000 mg | ORAL_TABLET | Freq: Once | ORAL | Status: AC
  Administered 2020-12-29: 12:00:00 5 mg via ORAL

## 2020-12-29 MED ORDER — BUPIVACAINE-EPINEPHRINE (PF) 0.5% -1:200000 IJ SOLN
INTRAMUSCULAR | Status: AC
  Filled 2020-12-29: qty 60

## 2020-12-29 MED ORDER — PROPOFOL 500 MG/50ML IV EMUL
INTRAVENOUS | Status: DC | PRN
  Administered 2020-12-29: 100 ug/kg/min via INTRAVENOUS

## 2020-12-29 MED ORDER — CHLORHEXIDINE GLUCONATE 0.12 % MT SOLN: 15.0000 mL | Freq: Once | OROMUCOSAL | Status: AC

## 2020-12-29 MED ORDER — SODIUM CHLORIDE 0.9 % IV SOLN: INTRAVENOUS | Status: DC

## 2020-12-29 MED ORDER — 0.9 % SODIUM CHLORIDE (POUR BTL) OPTIME
TOPICAL | Status: DC | PRN
  Administered 2020-12-29: 12:00:00 1000 mL

## 2020-12-29 MED ORDER — MIDAZOLAM HCL 2 MG/2ML IJ SOLN
INTRAMUSCULAR | Status: AC
  Filled 2020-12-29: qty 2

## 2020-12-29 MED ORDER — PHENYLEPHRINE HCL (PRESSORS) 10 MG/ML IV SOLN
INTRAVENOUS | Status: AC
  Filled 2020-12-29: qty 1

## 2020-12-29 MED ORDER — BUPIVACAINE-EPINEPHRINE (PF) 0.5% -1:200000 IJ SOLN
INTRAMUSCULAR | Status: DC | PRN
  Administered 2020-12-29: 5 mL

## 2020-12-29 MED ORDER — VANCOMYCIN HCL 1000 MG IV SOLR
INTRAVENOUS | Status: AC
  Filled 2020-12-29: qty 20

## 2020-12-29 MED ORDER — PROPOFOL 10 MG/ML IV BOLUS
INTRAVENOUS | Status: DC | PRN
  Administered 2020-12-29: 20 mg via INTRAVENOUS

## 2020-12-29 MED ORDER — OXYCODONE HCL 5 MG PO TABS
ORAL_TABLET | ORAL | Status: AC
  Filled 2020-12-29: qty 1

## 2020-12-29 SURGICAL SUPPLY — 68 items
ADH SKN CLS APL DERMABOND .7 (GAUZE/BANDAGES/DRESSINGS)
APL PRP STRL LF DISP 70% ISPRP (MISCELLANEOUS) ×2
BUR NEURO DRILL SOFT 3.0X3.8M (BURR) ×3 IMPLANT
CHLORAPREP W/TINT 26 (MISCELLANEOUS) ×6 IMPLANT
COUNTER NEEDLE 20/40 LG (NEEDLE) ×3 IMPLANT
COVER LIGHT HANDLE STERIS (MISCELLANEOUS) ×6 IMPLANT
CUP MEDICINE 2OZ PLAST GRAD ST (MISCELLANEOUS) ×3 IMPLANT
DERMABOND ADVANCED (GAUZE/BANDAGES/DRESSINGS)
DERMABOND ADVANCED .7 DNX12 (GAUZE/BANDAGES/DRESSINGS) IMPLANT
DEVICE IMPLANT NEUROSTIMINATOR (Neuro Prosthesis/Implant) ×1 IMPLANT
DEVICE IMPLANT NEUROSTIMULATOR (Neuro Prosthesis/Implant) ×1 IMPLANT
DRAPE C-ARMOR (DRAPES) IMPLANT
DRAPE INCISE IOBAN 66X45 STRL (DRAPES) ×3 IMPLANT
DRAPE LAPAROTOMY 77X122 PED (DRAPES) ×3 IMPLANT
DRAPE SURG 17X11 SM STRL (DRAPES) ×3 IMPLANT
DRSG OPSITE POSTOP 4X6 (GAUZE/BANDAGES/DRESSINGS) ×2 IMPLANT
DRSG TEGADERM 2-3/8X2-3/4 SM (GAUZE/BANDAGES/DRESSINGS) ×2 IMPLANT
DRSG TEGADERM 4X4.75 (GAUZE/BANDAGES/DRESSINGS) IMPLANT
DRSG TELFA 4X3 1S NADH ST (GAUZE/BANDAGES/DRESSINGS) IMPLANT
ELECT CAUTERY BLADE TIP 2.5 (TIP) ×3
ELECT REM PT RETURN 9FT ADLT (ELECTROSURGICAL) ×3
ELECTRODE CAUTERY BLDE TIP 2.5 (TIP) ×1 IMPLANT
ELECTRODE REM PT RTRN 9FT ADLT (ELECTROSURGICAL) ×1 IMPLANT
ENVELOPE ABSORB ANTIBACTERIAL (Mesh General) ×3 IMPLANT
FEE INTRAOP CADWELL SUPPLY NCS (MISCELLANEOUS) ×1 IMPLANT
FEE INTRAOP MONITOR IMPULS NCS (MISCELLANEOUS) IMPLANT
GAUZE 4X4 16PLY ~~LOC~~+RFID DBL (SPONGE) ×3 IMPLANT
GAUZE SPONGE 4X4 12PLY STRL (GAUZE/BANDAGES/DRESSINGS) ×3 IMPLANT
GLOVE SRG 8 PF TXTR STRL LF DI (GLOVE) ×1 IMPLANT
GLOVE SURG SYN 6.5 ES PF (GLOVE) ×6 IMPLANT
GLOVE SURG SYN 6.5 PF PI (GLOVE) ×2 IMPLANT
GLOVE SURG SYN 8.0 (GLOVE) ×3 IMPLANT
GLOVE SURG SYN 8.0 PF PI (GLOVE) ×1 IMPLANT
GLOVE SURG UNDER POLY LF SZ6.5 (GLOVE) ×6 IMPLANT
GLOVE SURG UNDER POLY LF SZ7 (GLOVE) ×3 IMPLANT
GLOVE SURG UNDER POLY LF SZ8 (GLOVE) ×3
GOWN SRG LRG LVL 4 IMPRV REINF (GOWNS) ×2 IMPLANT
GOWN STRL REIN LRG LVL4 (GOWNS) ×6
GOWN STRL REUS W/ TWL XL LVL3 (GOWN DISPOSABLE) ×2 IMPLANT
GOWN STRL REUS W/TWL XL LVL3 (GOWN DISPOSABLE) ×6
GRADUATE 1200CC STRL 31836 (MISCELLANEOUS) ×3 IMPLANT
INTRAOP CADWELL SUPPLY FEE NCS (MISCELLANEOUS) ×1
INTRAOP DISP SUPPLY FEE NCS (MISCELLANEOUS) ×3
INTRAOP MONITOR FEE IMPULS NCS (MISCELLANEOUS)
INTRAOP MONITOR FEE IMPULSE (MISCELLANEOUS)
KIT TURNOVER KIT A (KITS) ×3 IMPLANT
KIT WILSON FRAME (KITS) ×3 IMPLANT
MANIFOLD NEPTUNE II (INSTRUMENTS) ×3 IMPLANT
MARKER SKIN DUAL TIP RULER LAB (MISCELLANEOUS) ×3 IMPLANT
NEEDLE HYPO 22GX1.5 SAFETY (NEEDLE) ×3 IMPLANT
NS IRRIG 1000ML POUR BTL (IV SOLUTION) ×3 IMPLANT
PACK LAMINECTOMY NEURO (CUSTOM PROCEDURE TRAY) ×3 IMPLANT
PAD ARMBOARD 7.5X6 YLW CONV (MISCELLANEOUS) ×3 IMPLANT
POUCH TYRX ANTIBAC NEURO MED (Mesh General) IMPLANT
RECHARGER INTELLIS (NEUROSURGERY SUPPLIES) ×2 IMPLANT
REPROGRAMMER INTELLIS (NEUROSURGERY SUPPLIES) ×2 IMPLANT
SPONGE GAUZE 2X2 8PLY STER LF (GAUZE/BANDAGES/DRESSINGS) ×1
SPONGE GAUZE 2X2 8PLY STRL LF (GAUZE/BANDAGES/DRESSINGS) ×1 IMPLANT
SUT ETHILON 3-0 FS-10 30 BLK (SUTURE) ×3
SUT POLYSORB 2-0 5X18 GS-10 (SUTURE) ×9 IMPLANT
SUT SILK 2 0 PERMA HAND 18 BK (SUTURE) IMPLANT
SUT SILK 2 0 SH (SUTURE) ×9 IMPLANT
SUT VIC AB 0 CT1 18XCR BRD 8 (SUTURE) IMPLANT
SUT VIC AB 0 CT1 8-18 (SUTURE) ×3
SUTURE EHLN 3-0 FS-10 30 BLK (SUTURE) IMPLANT
SYR BULB IRRIG 60ML STRL (SYRINGE) ×3 IMPLANT
TOWEL OR 17X26 4PK STRL BLUE (TOWEL DISPOSABLE) ×6 IMPLANT
WATER STERILE IRR 500ML POUR (IV SOLUTION) ×3 IMPLANT

## 2020-12-29 NOTE — Discharge Summary (Signed)
Physician Discharge Summary  Patient ID: Jose Carroll MRN: [id number] DOB/AGE: [birth year] 55 y.o.  Admit date: 12/29/2020 Discharge date: 12/29/2020  Admission Diagnoses: chronic pain   Discharge Diagnoses:  Active Problems:   * No active hospital problems. *   Discharged Condition: good  Hospital Course:  Jose Carroll is a 55 y.o with a history of chronic pain presenting today for implantation of SCS pulse generator. His interoperative course was uncomplicated. He was monitored in the PACU and discharged home after ambulating and tolerating PO intake. He was given Oxycodone as needed for pain.  Consults: None  Significant Diagnostic Studies: none  Treatments: surgery: as above. Please see separately dictated operative report for further details.  Discharge Exam: Blood pressure (!) 151/91, pulse 84, temperature (!) 97.3 F (36.3 C), temperature source Temporal, resp. rate 16, height 5\' 10"  (1.778 m), weight 91.2 kg, SpO2 100 %. CN II-XII grossly intact  Good and equal strength in BLE Incisions c/d/i  Disposition: Discharge disposition: 01-Home or Self Care        Allergies as of 12/29/2020       Reactions   Naproxen Rash        Medication List     TAKE these medications    albuterol 108 (90 Base) MCG/ACT inhaler Commonly known as: VENTOLIN HFA Inhale 2 puffs into the lungs every 6 (six) hours as needed for wheezing or shortness of breath.   atorvastatin 80 MG tablet Commonly known as: LIPITOR Take 80 mg by mouth daily.   budesonide 0.25 MG/2ML nebulizer solution Commonly known as: PULMICORT Take 0.25 mg by nebulization 2 (two) times daily.   cephALEXin 500 MG capsule Commonly known as: Keflex Take 1 capsule (500 mg total) by mouth 4 (four) times daily for 7 days.   cetirizine 10 MG tablet Commonly known as: ZYRTEC Take 10 mg by mouth at bedtime.   empagliflozin 25 MG Tabs tablet Commonly known as: JARDIANCE Take 25 mg by mouth in the  morning.   formoterol 20 MCG/2ML nebulizer solution Commonly known as: PERFOROMIST Take 20 mcg by nebulization 2 (two) times daily.   furosemide 40 MG tablet Commonly known as: LASIX Take 40 mg by mouth daily as needed for edema.   gabapentin 600 MG tablet Commonly known as: NEURONTIN Take 1,200 mg by mouth 3 (three) times daily.   glipiZIDE 5 MG 24 hr tablet Commonly known as: GLUCOTROL XL Take 5 mg by mouth in the morning and at bedtime.   icosapent Ethyl 1 g capsule Commonly known as: VASCEPA Take 1 g by mouth 2 (two) times daily.   insulin degludec 100 UNIT/ML FlexTouch Pen Commonly known as: TRESIBA Inject 38 Units into the skin at bedtime.   isosorbide mononitrate 120 MG 24 hr tablet Commonly known as: IMDUR Take 120 mg by mouth in the morning and at bedtime.   lubiprostone 24 MCG capsule Commonly known as: AMITIZA Take 24 mcg by mouth daily with breakfast.   metoprolol succinate 100 MG 24 hr tablet Commonly known as: TOPROL-XL Take 100 mg by mouth daily. Take with or immediately following a meal.   multivitamin with minerals Tabs tablet Take 1 tablet by mouth daily.   nitroGLYCERIN 0.4 MG SL tablet Commonly known as: NITROSTAT Place 0.4 mg under the tongue every 5 (five) minutes as needed for chest pain.   oxyCODONE 5 MG immediate release tablet Commonly known as: Roxicodone Take 1 tablet (5 mg total) by mouth every 6 (six) hours as needed for up  to 3 days for severe pain (pain refractory to home ER oxycodone). What changed: reasons to take this   oxyCODONE ER 9 MG C12a Take 9 mg by mouth every 12 (twelve) hours.   pantoprazole 40 MG tablet Commonly known as: PROTONIX Take 40 mg by mouth daily before breakfast.   ranolazine 1000 MG SR tablet Commonly known as: RANEXA Take 1,000 mg by mouth 2 (two) times daily.   tamsulosin 0.4 MG Caps capsule Commonly known as: FLOMAX Take 0.4 mg by mouth daily.   ticagrelor 60 MG Tabs tablet Commonly known as:  BRILINTA Take 60 mg by mouth 2 (two) times daily.   tiZANidine 4 MG tablet Commonly known as: ZANAFLEX Take 4 mg by mouth in the morning, at noon, in the evening, and at bedtime.   Trulicity 0.27 XA/1.2IN Sopn Generic drug: Dulaglutide Inject 0.75 mg into the skin once a week.        Follow-up Information     Loleta Dicker, PA Follow up in 1 week(s).   Why: For suture removal. 2 weeks for incision check. Contact information: [place] [address] [phone]                 Signed: Loleta Dicker 12/29/2020, 12:08 PM

## 2020-12-29 NOTE — Interval H&P Note (Signed)
History and Physical Interval Note:  12/29/2020 10:55 AM  Jose Carroll  has presented today for surgery, with the diagnosis of g89.4 chronic pain.  The various methods of treatment have been discussed with the patient and family. After consideration of risks, benefits and other options for treatment, the patient has consented to  Procedure(s): PULSE GENERATOR IMPLANT (MEDTRONIC) (N/A) as a surgical intervention.  The patient's history has been reviewed, patient examined, no change in status, stable for surgery.  I have reviewed the patient's chart and labs.  Questions were answered to the patient's satisfaction.     Deetta Perla

## 2020-12-29 NOTE — Anesthesia Preprocedure Evaluation (Signed)
Anesthesia Evaluation  Patient identified by MRN, date of birth, ID band Patient awake    Reviewed: Allergy & Precautions, NPO status , Patient's Chart, lab work & pertinent test results  History of Anesthesia Complications Negative for: history of anesthetic complications  Airway Mallampati: II  TM Distance: >3 FB Neck ROM: full    Dental  (+) Edentulous Upper, Edentulous Lower   Pulmonary neg pulmonary ROS, shortness of breath and with exertion, COPD, Patient abstained from smoking., former smoker,    Pulmonary exam normal  + decreased breath sounds      Cardiovascular Exercise Tolerance: Poor hypertension, Pt. on medications + angina + CAD and + Peripheral Vascular Disease  negative cardio ROS Normal cardiovascular exam Rhythm:Regular     Neuro/Psych negative neurological ROS  negative psych ROS   GI/Hepatic negative GI ROS, Neg liver ROS, GERD  ,  Endo/Other  negative endocrine ROSdiabetes, Well Controlled, Type 2, Oral Hypoglycemic Agents  Renal/GU   negative genitourinary   Musculoskeletal  (+) Arthritis ,   Abdominal Normal abdominal exam  (+)   Peds negative pediatric ROS (+)  Hematology negative hematology ROS (+)   Anesthesia Other Findings Past Medical History: No date: Anginal pain (HCC) No date: Arthritis No date: Chronic pain No date: Chronic, continuous use of opioids No date: CKD (chronic kidney disease) No date: Claudication of both lower extremities (HCC) No date: COPD (chronic obstructive pulmonary disease) (HCC) No date: Coronary artery disease     Comment:  a.) LHC 09/10/2014: 50% mLCx, 50% mLAD, 100% p-m RCA;               good collaterals from LAD; med mgmt. b.) LHC 06/26/2015:               100% mRCA, 30% dLCx, 40% dLAD; good collaterals from               LAD/LCx; med mgmt. c.) LHC 10/28/2017: EF 60%; 100% mRCA,              95% m-dLCx, 30% dLCx; PCI placing a 2.5 x 15 mm Resolute                Onyx DES x1 to mLCx. Chronic RCA occlusion with L-R               collaterals. No date: Erectile dysfunction No date: GERD (gastroesophageal reflux disease) No date: HLD (hyperlipidemia) No date: Hypertension No date: Long term current use of antithrombotics/antiplatelets     Comment:  a.) ASA + ticagrelor No date: Peripheral edema No date: PVD (peripheral vascular disease) (HCC) No date: Shortness of breath No date: T2DM (type 2 diabetes mellitus) ([place]) No date: Vitamin D deficiency  Past Surgical History: No date: BACK SURGERY     Comment:  4 09/10/2014: CARDIAC CATHETERIZATION; N/A     Comment:  Procedure: Left Heart Cath and Coronary Angiogram ;                Surgeon: Dionisio David, MD;  Location: [facility];  Service: Cardiovascular;  Laterality: N/A; 06/26/2015: CARDIAC CATHETERIZATION; Left     Comment:  Procedure: Left Heart Cath and Coronary Angiography;                Surgeon: Dionisio David, MD;  Location: [facility];  Service: Cardiovascular;  Laterality: Left; 10/28/2017: CORONARY STENT INTERVENTION; N/A     Comment:  Procedure: CORONARY STENT INTERVENTION (2.5 x 15 mm               Resolute Onyx DES x 1 to mLCx);  Surgeon: Wellington Hampshire, MD;  Location: [facility];  Service:               Cardiovascular;  Laterality: N/A; 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 No date: FINGER SURGERY     Comment:  sixth finger removed No date: HERNIA REPAIR 10/28/2017: LEFT HEART CATH AND CORONARY ANGIOGRAPHY; Left     Comment:  Procedure: LEFT HEART CATH AND CORONARY ANGIOGRAPHY;                Surgeon: Dionisio David, MD;  Location: [facility];  Service: Cardiovascular;  Laterality: Left; No date: SUBCLAVIAN ARTERY STENT 12/22/2020: THORACIC LAMINECTOMY FOR SPINAL CORD STIMULATOR; N/A     Comment:  Procedure: THORACIC LAMINECTOMY FOR SPINAL CORD                STIMULATOR PADDLE TRIAL (MEDTRONIC);  Surgeon: Deetta Perla, MD;  Location: [facility];  Service: Neurosurgery;               Laterality: N/A; No date: TONSILLECTOMY     Reproductive/Obstetrics negative OB ROS                             Anesthesia Physical Anesthesia Plan  ASA: 4  Anesthesia Plan: MAC   Post-op Pain Management:    Induction: Intravenous  PONV Risk Score and Plan:   Airway Management Planned: Natural Airway and Nasal Cannula  Additional Equipment:   Intra-op Plan:   Post-operative Plan:   Informed Consent: I have reviewed the patients History and Physical, chart, labs and discussed the procedure including the risks, benefits and alternatives for the proposed anesthesia with the patient or authorized representative who has indicated his/her understanding and acceptance.       Plan Discussed with: CRNA and Surgeon  Anesthesia Plan Comments:         Anesthesia Quick Evaluation

## 2020-12-29 NOTE — Anesthesia Procedure Notes (Signed)
Date/Time: 12/29/2020 11:34 AM Performed by: Lia Foyer, CRNA Pre-anesthesia Checklist: Patient identified, Emergency Drugs available, Suction available, Timeout performed and Patient being monitored Patient Re-evaluated:Patient Re-evaluated prior to induction Oxygen Delivery Method: Nasal cannula Induction Type: IV induction

## 2020-12-29 NOTE — H&P (Signed)
Jose Carroll is an 55 y.o. male.   Chief Complaint: Chronic pain HPI: Mr Jose Carroll had a SCS trial this past week with paddle implant and got greater than 50% relief of his pain. He had no complications with the trial. He has been on antibiotics.   Past Medical History:  Diagnosis Date   Anginal pain (HCC)    Arthritis    Chronic pain    Chronic, continuous use of opioids    CKD (chronic kidney disease)    Claudication of both lower extremities (HCC)    COPD (chronic obstructive pulmonary disease) (HCC)    Coronary artery disease    a.) LHC 09/10/2014: 50% mLCx, 50% mLAD, 100% p-m RCA; good collaterals from LAD; med mgmt. b.) LHC 06/26/2015: 100% mRCA, 30% dLCx, 40% dLAD; good collaterals from LAD/LCx; med mgmt. c.) LHC 10/28/2017: EF 60%; 100% mRCA, 95% m-dLCx, 30% dLCx; PCI placing a 2.5 x 15 mm Resolute Onyx DES x1 to mLCx. Chronic RCA occlusion with L-R collaterals.   Erectile dysfunction    GERD (gastroesophageal reflux disease)    HLD (hyperlipidemia)    Hypertension    Long term current use of antithrombotics/antiplatelets    a.) ASA + ticagrelor   Peripheral edema    PVD (peripheral vascular disease) (HCC)    Shortness of breath    T2DM (type 2 diabetes mellitus) ([place])    Vitamin D deficiency     Past Surgical History:  Procedure Laterality Date   BACK SURGERY     4   CARDIAC CATHETERIZATION N/A 09/10/2014   Procedure: Left Heart Cath and Coronary Angiogram ;  Surgeon: Dionisio David, MD;  Location: [facility];  Service: Cardiovascular;  Laterality: N/A;   CARDIAC CATHETERIZATION Left 06/26/2015   Procedure: Left Heart Cath and Coronary Angiography;  Surgeon: Dionisio David, MD;  Location: [facility];  Service: Cardiovascular;  Laterality: Left;   CORONARY STENT INTERVENTION N/A 10/28/2017   Procedure: CORONARY STENT INTERVENTION (2.5 x 15 mm Resolute Onyx DES x 1 to mLCx);  Surgeon: Wellington Hampshire, MD;  Location: [facility];  Service:  Cardiovascular;  Laterality: N/A;   FETAL SURGERY FOR CONGENITAL HERNIA  2012   FINGER SURGERY     sixth finger removed   HERNIA REPAIR     LEFT HEART CATH AND CORONARY ANGIOGRAPHY Left 10/28/2017   Procedure: LEFT HEART CATH AND CORONARY ANGIOGRAPHY;  Surgeon: Dionisio David, MD;  Location: [facility];  Service: Cardiovascular;  Laterality: Left;   SUBCLAVIAN ARTERY STENT     THORACIC LAMINECTOMY FOR SPINAL CORD STIMULATOR N/A 12/22/2020   Procedure: THORACIC LAMINECTOMY FOR SPINAL CORD STIMULATOR PADDLE TRIAL (MEDTRONIC);  Surgeon: Deetta Perla, MD;  Location: [facility];  Service: Neurosurgery;  Laterality: N/A;   TONSILLECTOMY      Family History  Problem Relation Age of Onset   Cancer Mother    Diabetes Mother    Anuerysm Father    Social History:  reports that he quit smoking about 5 years ago. His smoking use included cigarettes. He has a 25.00 pack-year smoking history. He quit smokeless tobacco use about 5 years ago. He reports that he does not drink alcohol and does not use drugs.  Allergies:  Allergies  Allergen Reactions   Naproxen Rash    Medications Prior to Admission  Medication Sig Dispense Refill   albuterol (PROVENTIL HFA;VENTOLIN HFA) 108 (90 BASE) MCG/ACT inhaler Inhale 2 puffs into the lungs every 6 (  six) hours as needed for wheezing or shortness of breath.      atorvastatin (LIPITOR) 80 MG tablet Take 80 mg by mouth daily.     budesonide (PULMICORT) 0.25 MG/2ML nebulizer solution Take 0.25 mg by nebulization 2 (two) times daily.     cephALEXin (KEFLEX) 500 MG capsule Take 1 capsule (500 mg total) by mouth 4 (four) times daily for 7 days. 28 capsule 0   cetirizine (ZYRTEC) 10 MG tablet Take 10 mg by mouth at bedtime.     empagliflozin (JARDIANCE) 25 MG TABS tablet Take 25 mg by mouth in the morning.     formoterol (PERFOROMIST) 20 MCG/2ML nebulizer solution Take 20 mcg by nebulization 2 (two) times daily.     furosemide (LASIX) 40 MG tablet Take 40 mg  by mouth daily as needed for edema.     gabapentin (NEURONTIN) 600 MG tablet Take 1,200 mg by mouth 3 (three) times daily.      glipiZIDE (GLUCOTROL XL) 5 MG 24 hr tablet Take 5 mg by mouth in the morning and at bedtime.     icosapent Ethyl (VASCEPA) 1 g capsule Take 1 g by mouth 2 (two) times daily.     insulin degludec (TRESIBA) 100 UNIT/ML FlexTouch Pen Inject 38 Units into the skin at bedtime.     isosorbide mononitrate (IMDUR) 120 MG 24 hr tablet Take 120 mg by mouth in the morning and at bedtime.     lubiprostone (AMITIZA) 24 MCG capsule Take 24 mcg by mouth daily with breakfast.     metoprolol succinate (TOPROL-XL) 100 MG 24 hr tablet Take 100 mg by mouth daily. Take with or immediately following a meal.     Multiple Vitamin (MULTIVITAMIN WITH MINERALS) TABS tablet Take 1 tablet by mouth daily.     nitroGLYCERIN (NITROSTAT) 0.4 MG SL tablet Place 0.4 mg under the tongue every 5 (five) minutes as needed for chest pain.     oxyCODONE (ROXICODONE) 5 MG immediate release tablet Take 1 tablet (5 mg total) by mouth every 6 (six) hours as needed for up to 7 days for severe pain (pain refractory to home Oxycodone). 28 tablet 0   oxyCODONE ER 9 MG C12A Take 9 mg by mouth every 12 (twelve) hours.      pantoprazole (PROTONIX) 40 MG tablet Take 40 mg by mouth daily before breakfast.     ranolazine (RANEXA) 1000 MG SR tablet Take 1,000 mg by mouth 2 (two) times daily.     tamsulosin (FLOMAX) 0.4 MG CAPS capsule Take 0.4 mg by mouth daily.     ticagrelor (BRILINTA) 60 MG TABS tablet Take 60 mg by mouth 2 (two) times daily.     tiZANidine (ZANAFLEX) 4 MG tablet Take 4 mg by mouth in the morning, at noon, in the evening, and at bedtime.     TRULICITY 3.66 QH/4.7ML SOPN Inject 0.75 mg into the skin once a week.      Results for orders placed or performed during the hospital encounter of 12/29/20 (from the past 48 hour(s))  Glucose, capillary     Status: Abnormal   Collection Time: 12/29/20 10:30 AM   Result Value Ref Range   Glucose-Capillary 180 (H) 70 - 99 mg/dL    Comment: Glucose reference range applies only to samples taken after fasting for at least 8 hours.   No results found.  Review of Systems General ROS: Negative Psychological ROS: Negative Ophthalmic ROS: Negative ENT ROS: Negative Hematological and Lymphatic ROS: Negative  Endocrine ROS: Negative Respiratory ROS: Negative Cardiovascular ROS: Negative Gastrointestinal ROS: Negative Genito-Urinary ROS: Negative Musculoskeletal ROS: Positive for back pain Neurological ROS: Positive for leg pain Dermatological ROS: Negative There were no vitals taken for this visit. Physical Exam  General appearance: Alert, cooperative, in no acute distress Head: Normocephalic, atraumatic Eyes: Normal, EOM intact Oropharynx: Moist without lesions Back: Well-healed midline incision Ext: No edema in LE bilaterally  Neurologic exam:  Mental status: alertness: alert, affect: normal Speech: fluent and clear Motor:strength symmetric 5/5, normal muscle mass in bilateral lower extremities Sensory: intact to light touch in bilateral lower extremities Gait: normal    Assessment/Plan Proceed with trial lead removal and pulse generator placement  Deetta Perla, MD 12/29/2020, 10:52 AM

## 2020-12-29 NOTE — Discharge Instructions (Addendum)
NEUROSURGERY DISCHARGE INSTRUCTIONS  Admission diagnosis: g89.4 chronic pain  Operative procedure: Implantation of SCS pulse generator.  What to do after you leave the hospital:  Recommended diet: diabetic diet. Increase protein intake to promote wound healing.  Recommended activity: no lifting, driving, or strenuous exercise for 6 weeks . You should walk multiple times per day  Special Instructions  No straining, no heavy lifting > 10lbs x 4 weeks.  Keep incision area clean and dry. May shower in 2 days. No baths or pools for 6 weeks.  Please remove dressings tomorrow, no need to apply a bandage afterwards  You have sutures or staples that will be removed in clinic next week. Both at midline incision and previous trial lead exit site.   Please take pain medications as directed. Take a stool softener if on pain medications   Please Report any of the following: Nausea or Vomiting, Temperature is greater than 101.79F (38.1C) degrees, Dizziness, Abdominal Pain, Difficulty Breathing or Shortness of Breath, Inability to Eat, drink Fluids, or Take medications, Bleeding, swelling, or drainage from surgical incision sites, New numbness or weakness, and Bowel or bladder dysfunction to the neurosurgeon on call at [phone]  Additional Follow up appointments Please follow up with Cooper Render PA-C in [facility] as scheduled in 1 week for suture removal and 2-3 weeks for incision check and meeting with SCS representative. Please bring stimulator accessories to second appointment so that the rep can make adjustments as needed.   Please see below for scheduled appointments:  No future appointments.  AMBULATORY SURGERY  DISCHARGE INSTRUCTIONS   The drugs that you were given will stay in your system until tomorrow so for the next 24 hours you should not:  Drive an automobile Make any legal decisions Drink any alcoholic beverage   You may resume regular meals tomorrow.  Today it is  better to start with liquids and gradually work up to solid foods.  You may eat anything you prefer, but it is better to start with liquids, then soup and crackers, and gradually work up to solid foods.   Please notify your doctor immediately if you have any unusual bleeding, trouble breathing, redness and pain at the surgery site, drainage, fever, or pain not relieved by medication.    Additional Instructions: Please contact your physician with any problems or Same Day Surgery at [phone], Monday through Friday 6 am to 4 pm, or [place] at [facility] number at [phone].   AMBULATORY SURGERY  DISCHARGE INSTRUCTIONS   The drugs that you were given will stay in your system until tomorrow so for the next 24 hours you should not:  Drive an automobile Make any legal decisions Drink any alcoholic beverage   You may resume regular meals tomorrow.  Today it is better to start with liquids and gradually work up to solid foods.  You may eat anything you prefer, but it is better to start with liquids, then soup and crackers, and gradually work up to solid foods.   Please notify your doctor immediately if you have any unusual bleeding, trouble breathing, redness and pain at the surgery site, drainage, fever, or pain not relieved by medication.    Your post-operative visit with Dr.                                       is: Date:  Time:    Please call to schedule your post-operative visit.  Additional Instructions:

## 2020-12-29 NOTE — Transfer of Care (Signed)
Immediate Anesthesia Transfer of Care Note  Patient: Jose Carroll  Procedure(s) Performed: PULSE GENERATOR IMPLANT (MEDTRONIC) (Thoracic)  Patient Location: PACU  Anesthesia Type:General  Level of Consciousness: drowsy and patient cooperative  Airway & Oxygen Therapy: Patient Spontanous Breathing  Post-op Assessment: Report given to RN and Post -op Vital signs reviewed and stable  Post vital signs: Reviewed and stable  Last Vitals:  Vitals Value Taken Time  BP 100/73 12/29/20 1207  Temp    Pulse 87 12/29/20 1210  Resp 20 12/29/20 1210  SpO2 100 % 12/29/20 1210  Vitals shown include unvalidated device data.  Last Pain:  Vitals:   12/29/20 1053  TempSrc: Temporal  PainSc: 4          Complications: No notable events documented.

## 2020-12-29 NOTE — Op Note (Signed)
Operative Note   SURGERY DATE:  12/29/2020   PRE-OP DIAGNOSIS: Chronic Pain Syndrome (G89.4)   POST-OP DIAGNOSIS:  Chronic Pain Syndrome (G89.4)   Procedure(s) with comments: Placement Right Flank Pulse Generator  Removal trial extension lead  SURGEON:     * Malen Gauze, MD       Cooper Render, PA Assistant   ANESTHESIA: General    OPERATIVE FINDINGS: Successful Stimulator Paddle Trial  Indications: Jose Carroll underwent a thoracic paddle SCS trial implanted on 12/12. He state sover the week, he had good relief of his leg pain and mostly on right. He felt greater than 50% of his pain was relieved. Given this, we recommended proceeding with battery placement for continued relief. The risks including hematoma, infection, damage to spinal cord stimulator wires, failure of relief of pain, and need for revision surgery were discussed. The patient elected to proceed with the battery placement   Procedure:  The patient was brought to the operating room and placed prone on a standard OR table. The anesthesia service had vascular access and provided sedation medications to the patient.  The right flank incision and left exit site was prepped and draped in a sterile fashion and the previous incision marked.  A hard timeout was performed.  Local anesthetic was instilled into the incision.    Incision was opened bluntly removing sutures.  Wires were seen to be intact without any obvious damage and the extensions exposed. The extensions were removed from the leads and then these were cut. The leads were pulled from the exit site. Hemostasis was obtained and the battery pocket was irrigated with saline.  Electrodes were attached to the battery and interrogated. The impedences were found to be normal.  Electrodes were secured and tightened.  The battery was placed in an antibiotic pouch and into the pocket and secured with a 2-0 silk suture.  Vancomycin powder was placed into the pocket.   The  deep tissue was closed with 0 Vicryl and subcutaneous with 2-0 Vicryl.  The skin was closed with surgical adhesive. The exit site was closed with Nylon  A dressing was applied.  Patient was turned to the supine position and sedation medications were stopped and the patient was awoken from anesthesia.  Patient was taken to the PACU for further recovery and seen to be at neurologic baseline.         ESTIMATED BLOOD LOSS:   20 cc     IMPLANT DEVICE [name] [id number] H  Inventory Item: DEVICE IMPLANT NEUROSTIMINATOR Serial no.: [id number] Model/Cat no.: [id number]  Implant name: DEVICE [name] [id number] H Laterality: N/A Area: Thoracic  Manufacturer: MEDTRONIC NEUROMOD PAIN MGMT Date of Manufacture:    Action: Implanted Number Used: 1   Device Identifier:  Device Identifier Type:     ENVELOPE ABSORB ANTIBACTERIAL - [id number]  Inventory Item: ENVELOPE ABSORB ANTIBACTERIAL Serial no.:  Model/Cat no.: [id number]  Implant name: ENVELOPE ABSORB ANTIBACTERIAL - [id number] Laterality: N/A Area: Thoracic  Manufacturer: [employer] [place] [employer] Date of Manufacture:    Action: Implanted Number Used: 1   Device Identifier:  Device Identifier Type:         I performed the case in its entirety with assistance of PA, Jose Carroll, [place]

## 2020-12-30 ENCOUNTER — Encounter: Payer: Self-pay | Admitting: Neurosurgery

## 2020-12-30 NOTE — Anesthesia Postprocedure Evaluation (Signed)
Anesthesia Post Note  Patient: Jose Carroll  Procedure(s) Performed: PULSE GENERATOR IMPLANT (MEDTRONIC) (Thoracic)  Patient location during evaluation: PACU Anesthesia Type: MAC Level of consciousness: awake and awake and alert Pain management: satisfactory to patient Vital Signs Assessment: post-procedure vital signs reviewed and stable Respiratory status: spontaneous breathing and respiratory function stable Cardiovascular status: stable Anesthetic complications: no   No notable events documented.   Last Vitals:  Vitals:   12/29/20 1230 12/29/20 1240  BP: 127/85 (!) 131/99  Pulse: 80 76  Resp: (!) 25 17  Temp: (!) 36.4 C (!) 36.3 C  SpO2: 100% 98%    Last Pain:  Vitals:   12/29/20 1240  TempSrc: Temporal  PainSc: 2                  Jose Carroll

## 2021-01-07 DIAGNOSIS — J449 Chronic obstructive pulmonary disease, unspecified: Secondary | ICD-10-CM | POA: Diagnosis not present

## 2021-01-14 DIAGNOSIS — Z9181 History of falling: Secondary | ICD-10-CM | POA: Diagnosis not present

## 2021-01-14 DIAGNOSIS — M545 Low back pain, unspecified: Secondary | ICD-10-CM | POA: Diagnosis not present

## 2021-01-14 DIAGNOSIS — M79661 Pain in right lower leg: Secondary | ICD-10-CM | POA: Diagnosis not present

## 2021-01-14 DIAGNOSIS — M79662 Pain in left lower leg: Secondary | ICD-10-CM | POA: Diagnosis not present

## 2021-01-15 DIAGNOSIS — E119 Type 2 diabetes mellitus without complications: Secondary | ICD-10-CM | POA: Diagnosis not present

## 2021-01-16 DIAGNOSIS — I2510 Atherosclerotic heart disease of native coronary artery without angina pectoris: Secondary | ICD-10-CM | POA: Diagnosis not present

## 2021-01-16 DIAGNOSIS — I7389 Other specified peripheral vascular diseases: Secondary | ICD-10-CM | POA: Diagnosis not present

## 2021-01-16 DIAGNOSIS — R69 Illness, unspecified: Secondary | ICD-10-CM | POA: Diagnosis not present

## 2021-01-16 DIAGNOSIS — E785 Hyperlipidemia, unspecified: Secondary | ICD-10-CM | POA: Diagnosis not present

## 2021-01-16 DIAGNOSIS — Z9861 Coronary angioplasty status: Secondary | ICD-10-CM | POA: Diagnosis not present

## 2021-01-16 DIAGNOSIS — J449 Chronic obstructive pulmonary disease, unspecified: Secondary | ICD-10-CM | POA: Diagnosis not present

## 2021-01-16 DIAGNOSIS — K2100 Gastro-esophageal reflux disease with esophagitis, without bleeding: Secondary | ICD-10-CM | POA: Diagnosis not present

## 2021-01-16 DIAGNOSIS — I10 Essential (primary) hypertension: Secondary | ICD-10-CM | POA: Diagnosis not present

## 2021-01-29 DIAGNOSIS — J449 Chronic obstructive pulmonary disease, unspecified: Secondary | ICD-10-CM | POA: Diagnosis not present

## 2021-02-16 DIAGNOSIS — E785 Hyperlipidemia, unspecified: Secondary | ICD-10-CM | POA: Diagnosis not present

## 2021-02-16 DIAGNOSIS — I10 Essential (primary) hypertension: Secondary | ICD-10-CM | POA: Diagnosis not present

## 2021-02-16 DIAGNOSIS — E1165 Type 2 diabetes mellitus with hyperglycemia: Secondary | ICD-10-CM | POA: Diagnosis not present

## 2021-02-16 DIAGNOSIS — E669 Obesity, unspecified: Secondary | ICD-10-CM | POA: Diagnosis not present

## 2021-02-17 DIAGNOSIS — I10 Essential (primary) hypertension: Secondary | ICD-10-CM | POA: Diagnosis not present

## 2021-02-17 DIAGNOSIS — N189 Chronic kidney disease, unspecified: Secondary | ICD-10-CM | POA: Diagnosis not present

## 2021-02-17 DIAGNOSIS — E1165 Type 2 diabetes mellitus with hyperglycemia: Secondary | ICD-10-CM | POA: Diagnosis not present

## 2021-02-17 DIAGNOSIS — R339 Retention of urine, unspecified: Secondary | ICD-10-CM | POA: Diagnosis not present

## 2021-02-17 DIAGNOSIS — G8929 Other chronic pain: Secondary | ICD-10-CM | POA: Diagnosis not present

## 2021-02-17 DIAGNOSIS — E785 Hyperlipidemia, unspecified: Secondary | ICD-10-CM | POA: Diagnosis not present

## 2021-02-18 DIAGNOSIS — M545 Low back pain, unspecified: Secondary | ICD-10-CM | POA: Diagnosis not present

## 2021-02-18 DIAGNOSIS — Z79891 Long term (current) use of opiate analgesic: Secondary | ICD-10-CM | POA: Diagnosis not present

## 2021-02-18 DIAGNOSIS — M79669 Pain in unspecified lower leg: Secondary | ICD-10-CM | POA: Diagnosis not present

## 2021-02-18 DIAGNOSIS — R69 Illness, unspecified: Secondary | ICD-10-CM | POA: Diagnosis not present

## 2021-02-18 DIAGNOSIS — M79662 Pain in left lower leg: Secondary | ICD-10-CM | POA: Diagnosis not present

## 2021-02-18 DIAGNOSIS — M79661 Pain in right lower leg: Secondary | ICD-10-CM | POA: Diagnosis not present

## 2021-02-18 DIAGNOSIS — F1120 Opioid dependence, uncomplicated: Secondary | ICD-10-CM | POA: Diagnosis not present

## 2021-02-18 DIAGNOSIS — M79673 Pain in unspecified foot: Secondary | ICD-10-CM | POA: Diagnosis not present

## 2021-02-26 DIAGNOSIS — M532X6 Spinal instabilities, lumbar region: Secondary | ICD-10-CM | POA: Diagnosis not present

## 2021-04-06 DIAGNOSIS — F1120 Opioid dependence, uncomplicated: Secondary | ICD-10-CM | POA: Diagnosis not present

## 2021-04-06 DIAGNOSIS — M79672 Pain in left foot: Secondary | ICD-10-CM | POA: Diagnosis not present

## 2021-04-06 DIAGNOSIS — M79671 Pain in right foot: Secondary | ICD-10-CM | POA: Diagnosis not present

## 2021-04-06 DIAGNOSIS — M545 Low back pain, unspecified: Secondary | ICD-10-CM | POA: Diagnosis not present

## 2021-04-06 DIAGNOSIS — R69 Illness, unspecified: Secondary | ICD-10-CM | POA: Diagnosis not present

## 2021-04-06 DIAGNOSIS — Z9181 History of falling: Secondary | ICD-10-CM | POA: Diagnosis not present

## 2021-04-06 DIAGNOSIS — M79661 Pain in right lower leg: Secondary | ICD-10-CM | POA: Diagnosis not present

## 2021-04-06 DIAGNOSIS — G894 Chronic pain syndrome: Secondary | ICD-10-CM | POA: Diagnosis not present

## 2021-04-06 DIAGNOSIS — M79605 Pain in left leg: Secondary | ICD-10-CM | POA: Diagnosis not present

## 2021-04-06 DIAGNOSIS — M79604 Pain in right leg: Secondary | ICD-10-CM | POA: Diagnosis not present

## 2021-04-06 DIAGNOSIS — M79662 Pain in left lower leg: Secondary | ICD-10-CM | POA: Diagnosis not present

## 2021-04-15 DIAGNOSIS — E119 Type 2 diabetes mellitus without complications: Secondary | ICD-10-CM | POA: Diagnosis not present

## 2021-04-27 DIAGNOSIS — M79673 Pain in unspecified foot: Secondary | ICD-10-CM | POA: Diagnosis not present

## 2021-04-27 DIAGNOSIS — M79661 Pain in right lower leg: Secondary | ICD-10-CM | POA: Diagnosis not present

## 2021-04-27 DIAGNOSIS — Z9181 History of falling: Secondary | ICD-10-CM | POA: Diagnosis not present

## 2021-04-27 DIAGNOSIS — M545 Low back pain, unspecified: Secondary | ICD-10-CM | POA: Diagnosis not present

## 2021-04-27 DIAGNOSIS — M79662 Pain in left lower leg: Secondary | ICD-10-CM | POA: Diagnosis not present

## 2021-04-27 DIAGNOSIS — Z79899 Other long term (current) drug therapy: Secondary | ICD-10-CM | POA: Diagnosis not present

## 2021-05-25 DIAGNOSIS — M79662 Pain in left lower leg: Secondary | ICD-10-CM | POA: Diagnosis not present

## 2021-05-25 DIAGNOSIS — M79673 Pain in unspecified foot: Secondary | ICD-10-CM | POA: Diagnosis not present

## 2021-05-25 DIAGNOSIS — Z9181 History of falling: Secondary | ICD-10-CM | POA: Diagnosis not present

## 2021-05-25 DIAGNOSIS — M545 Low back pain, unspecified: Secondary | ICD-10-CM | POA: Diagnosis not present

## 2021-05-25 DIAGNOSIS — M79661 Pain in right lower leg: Secondary | ICD-10-CM | POA: Diagnosis not present

## 2021-06-01 DIAGNOSIS — J449 Chronic obstructive pulmonary disease, unspecified: Secondary | ICD-10-CM | POA: Diagnosis not present

## 2021-06-02 DIAGNOSIS — Z79899 Other long term (current) drug therapy: Secondary | ICD-10-CM | POA: Diagnosis not present

## 2021-06-02 DIAGNOSIS — G894 Chronic pain syndrome: Secondary | ICD-10-CM | POA: Diagnosis not present

## 2021-06-15 DIAGNOSIS — M79662 Pain in left lower leg: Secondary | ICD-10-CM | POA: Diagnosis not present

## 2021-06-15 DIAGNOSIS — M79661 Pain in right lower leg: Secondary | ICD-10-CM | POA: Diagnosis not present

## 2021-06-15 DIAGNOSIS — M545 Low back pain, unspecified: Secondary | ICD-10-CM | POA: Diagnosis not present

## 2021-06-23 DIAGNOSIS — J449 Chronic obstructive pulmonary disease, unspecified: Secondary | ICD-10-CM | POA: Diagnosis not present

## 2021-06-29 DIAGNOSIS — E785 Hyperlipidemia, unspecified: Secondary | ICD-10-CM | POA: Diagnosis not present

## 2021-06-29 DIAGNOSIS — R339 Retention of urine, unspecified: Secondary | ICD-10-CM | POA: Diagnosis not present

## 2021-06-29 DIAGNOSIS — R079 Chest pain, unspecified: Secondary | ICD-10-CM | POA: Diagnosis not present

## 2021-06-29 DIAGNOSIS — J449 Chronic obstructive pulmonary disease, unspecified: Secondary | ICD-10-CM | POA: Diagnosis not present

## 2021-06-29 DIAGNOSIS — E119 Type 2 diabetes mellitus without complications: Secondary | ICD-10-CM | POA: Diagnosis not present

## 2021-06-29 DIAGNOSIS — F17210 Nicotine dependence, cigarettes, uncomplicated: Secondary | ICD-10-CM | POA: Diagnosis not present

## 2021-06-29 DIAGNOSIS — N189 Chronic kidney disease, unspecified: Secondary | ICD-10-CM | POA: Diagnosis not present

## 2021-06-29 DIAGNOSIS — I10 Essential (primary) hypertension: Secondary | ICD-10-CM | POA: Diagnosis not present

## 2021-06-29 DIAGNOSIS — E1165 Type 2 diabetes mellitus with hyperglycemia: Secondary | ICD-10-CM | POA: Diagnosis not present

## 2021-06-29 DIAGNOSIS — I2510 Atherosclerotic heart disease of native coronary artery without angina pectoris: Secondary | ICD-10-CM | POA: Diagnosis not present

## 2021-06-29 DIAGNOSIS — I7389 Other specified peripheral vascular diseases: Secondary | ICD-10-CM | POA: Diagnosis not present

## 2021-06-29 DIAGNOSIS — R69 Illness, unspecified: Secondary | ICD-10-CM | POA: Diagnosis not present

## 2021-06-29 DIAGNOSIS — F17200 Nicotine dependence, unspecified, uncomplicated: Secondary | ICD-10-CM | POA: Diagnosis not present

## 2021-06-29 DIAGNOSIS — K2100 Gastro-esophageal reflux disease with esophagitis, without bleeding: Secondary | ICD-10-CM | POA: Diagnosis not present

## 2021-07-02 DIAGNOSIS — E1165 Type 2 diabetes mellitus with hyperglycemia: Secondary | ICD-10-CM | POA: Diagnosis not present

## 2021-07-02 DIAGNOSIS — I2510 Atherosclerotic heart disease of native coronary artery without angina pectoris: Secondary | ICD-10-CM | POA: Diagnosis not present

## 2021-07-02 DIAGNOSIS — E785 Hyperlipidemia, unspecified: Secondary | ICD-10-CM | POA: Diagnosis not present

## 2021-07-02 DIAGNOSIS — E119 Type 2 diabetes mellitus without complications: Secondary | ICD-10-CM | POA: Diagnosis not present

## 2021-07-02 DIAGNOSIS — J449 Chronic obstructive pulmonary disease, unspecified: Secondary | ICD-10-CM | POA: Diagnosis not present

## 2021-07-16 DIAGNOSIS — E119 Type 2 diabetes mellitus without complications: Secondary | ICD-10-CM | POA: Diagnosis not present

## 2021-07-17 DIAGNOSIS — J449 Chronic obstructive pulmonary disease, unspecified: Secondary | ICD-10-CM | POA: Diagnosis not present

## 2021-07-23 ENCOUNTER — Ambulatory Visit: Payer: Medicare HMO | Admitting: Urology

## 2021-08-05 DIAGNOSIS — G894 Chronic pain syndrome: Secondary | ICD-10-CM | POA: Diagnosis not present

## 2021-08-05 DIAGNOSIS — M79661 Pain in right lower leg: Secondary | ICD-10-CM | POA: Diagnosis not present

## 2021-08-05 DIAGNOSIS — M545 Low back pain, unspecified: Secondary | ICD-10-CM | POA: Diagnosis not present

## 2021-08-05 DIAGNOSIS — Z79899 Other long term (current) drug therapy: Secondary | ICD-10-CM | POA: Diagnosis not present

## 2021-08-05 DIAGNOSIS — M79662 Pain in left lower leg: Secondary | ICD-10-CM | POA: Diagnosis not present

## 2021-08-10 DIAGNOSIS — I129 Hypertensive chronic kidney disease with stage 1 through stage 4 chronic kidney disease, or unspecified chronic kidney disease: Secondary | ICD-10-CM | POA: Diagnosis not present

## 2021-08-10 DIAGNOSIS — N189 Chronic kidney disease, unspecified: Secondary | ICD-10-CM | POA: Diagnosis not present

## 2021-08-10 DIAGNOSIS — E1122 Type 2 diabetes mellitus with diabetic chronic kidney disease: Secondary | ICD-10-CM | POA: Diagnosis not present

## 2021-08-11 DIAGNOSIS — J449 Chronic obstructive pulmonary disease, unspecified: Secondary | ICD-10-CM | POA: Diagnosis not present

## 2021-08-11 DIAGNOSIS — E119 Type 2 diabetes mellitus without complications: Secondary | ICD-10-CM | POA: Diagnosis not present

## 2021-08-12 ENCOUNTER — Ambulatory Visit: Payer: Medicare HMO | Admitting: Urology

## 2021-08-14 ENCOUNTER — Encounter: Payer: Self-pay | Admitting: Urology

## 2021-08-17 ENCOUNTER — Other Ambulatory Visit: Payer: Self-pay | Admitting: *Deleted

## 2021-08-17 NOTE — Patient Outreach (Signed)
  Care Coordination   08/17/2021 Name: Jose Carroll MRN: [id number] DOB: [birth year]   Care Coordination Outreach Attempts:  An unsuccessful telephone outreach was attempted today to offer the patient information about available care coordination services as a benefit of their health plan.   Follow Up Plan:  Additional outreach attempts will be made to offer the patient care coordination information and services.   Encounter Outcome:  Pt. Request to Call Back  Care Coordination Interventions Activated:  No   Care Coordination Interventions:  No, not indicated    Emelia Loron RN, BSN [place] [phone] [name]

## 2021-09-03 DIAGNOSIS — J449 Chronic obstructive pulmonary disease, unspecified: Secondary | ICD-10-CM | POA: Diagnosis not present

## 2021-09-07 DIAGNOSIS — M545 Low back pain, unspecified: Secondary | ICD-10-CM | POA: Diagnosis not present

## 2021-09-07 DIAGNOSIS — M79661 Pain in right lower leg: Secondary | ICD-10-CM | POA: Diagnosis not present

## 2021-09-07 DIAGNOSIS — Z79899 Other long term (current) drug therapy: Secondary | ICD-10-CM | POA: Diagnosis not present

## 2021-09-07 DIAGNOSIS — M79662 Pain in left lower leg: Secondary | ICD-10-CM | POA: Diagnosis not present

## 2021-09-28 DIAGNOSIS — J449 Chronic obstructive pulmonary disease, unspecified: Secondary | ICD-10-CM | POA: Diagnosis not present

## 2021-10-10 DIAGNOSIS — N189 Chronic kidney disease, unspecified: Secondary | ICD-10-CM | POA: Diagnosis not present

## 2021-10-10 DIAGNOSIS — I129 Hypertensive chronic kidney disease with stage 1 through stage 4 chronic kidney disease, or unspecified chronic kidney disease: Secondary | ICD-10-CM | POA: Diagnosis not present

## 2021-10-10 DIAGNOSIS — E1122 Type 2 diabetes mellitus with diabetic chronic kidney disease: Secondary | ICD-10-CM | POA: Diagnosis not present

## 2021-10-19 DIAGNOSIS — E119 Type 2 diabetes mellitus without complications: Secondary | ICD-10-CM | POA: Diagnosis not present

## 2021-10-22 DIAGNOSIS — I7389 Other specified peripheral vascular diseases: Secondary | ICD-10-CM | POA: Diagnosis not present

## 2021-10-22 DIAGNOSIS — J449 Chronic obstructive pulmonary disease, unspecified: Secondary | ICD-10-CM | POA: Diagnosis not present

## 2021-10-26 DIAGNOSIS — E1165 Type 2 diabetes mellitus with hyperglycemia: Secondary | ICD-10-CM | POA: Diagnosis not present

## 2021-10-26 DIAGNOSIS — E785 Hyperlipidemia, unspecified: Secondary | ICD-10-CM | POA: Diagnosis not present

## 2021-10-26 DIAGNOSIS — I129 Hypertensive chronic kidney disease with stage 1 through stage 4 chronic kidney disease, or unspecified chronic kidney disease: Secondary | ICD-10-CM | POA: Diagnosis not present

## 2021-10-26 DIAGNOSIS — E1122 Type 2 diabetes mellitus with diabetic chronic kidney disease: Secondary | ICD-10-CM | POA: Diagnosis not present

## 2021-10-26 DIAGNOSIS — N189 Chronic kidney disease, unspecified: Secondary | ICD-10-CM | POA: Diagnosis not present

## 2021-10-26 DIAGNOSIS — E669 Obesity, unspecified: Secondary | ICD-10-CM | POA: Diagnosis not present

## 2021-11-02 DIAGNOSIS — J449 Chronic obstructive pulmonary disease, unspecified: Secondary | ICD-10-CM | POA: Diagnosis not present

## 2021-11-02 DIAGNOSIS — M79661 Pain in right lower leg: Secondary | ICD-10-CM | POA: Diagnosis not present

## 2021-11-02 DIAGNOSIS — Z79899 Other long term (current) drug therapy: Secondary | ICD-10-CM | POA: Diagnosis not present

## 2021-11-02 DIAGNOSIS — M79662 Pain in left lower leg: Secondary | ICD-10-CM | POA: Diagnosis not present

## 2021-11-02 DIAGNOSIS — G2581 Restless legs syndrome: Secondary | ICD-10-CM | POA: Diagnosis not present

## 2021-11-02 DIAGNOSIS — N189 Chronic kidney disease, unspecified: Secondary | ICD-10-CM | POA: Diagnosis not present

## 2021-11-02 DIAGNOSIS — M545 Low back pain, unspecified: Secondary | ICD-10-CM | POA: Diagnosis not present

## 2021-11-02 DIAGNOSIS — E785 Hyperlipidemia, unspecified: Secondary | ICD-10-CM | POA: Diagnosis not present

## 2021-11-02 DIAGNOSIS — I10 Essential (primary) hypertension: Secondary | ICD-10-CM | POA: Diagnosis not present

## 2021-11-02 DIAGNOSIS — E1122 Type 2 diabetes mellitus with diabetic chronic kidney disease: Secondary | ICD-10-CM | POA: Diagnosis not present

## 2021-11-02 DIAGNOSIS — E1165 Type 2 diabetes mellitus with hyperglycemia: Secondary | ICD-10-CM | POA: Diagnosis not present

## 2021-11-10 DIAGNOSIS — K2100 Gastro-esophageal reflux disease with esophagitis, without bleeding: Secondary | ICD-10-CM | POA: Diagnosis not present

## 2021-11-10 DIAGNOSIS — J449 Chronic obstructive pulmonary disease, unspecified: Secondary | ICD-10-CM | POA: Diagnosis not present

## 2021-11-10 DIAGNOSIS — I2510 Atherosclerotic heart disease of native coronary artery without angina pectoris: Secondary | ICD-10-CM | POA: Diagnosis not present

## 2021-11-10 DIAGNOSIS — I7389 Other specified peripheral vascular diseases: Secondary | ICD-10-CM | POA: Diagnosis not present

## 2021-11-10 DIAGNOSIS — I340 Nonrheumatic mitral (valve) insufficiency: Secondary | ICD-10-CM | POA: Diagnosis not present

## 2021-11-10 DIAGNOSIS — R69 Illness, unspecified: Secondary | ICD-10-CM | POA: Diagnosis not present

## 2021-11-10 DIAGNOSIS — R9439 Abnormal result of other cardiovascular function study: Secondary | ICD-10-CM | POA: Diagnosis not present

## 2021-11-10 DIAGNOSIS — E119 Type 2 diabetes mellitus without complications: Secondary | ICD-10-CM | POA: Diagnosis not present

## 2021-11-10 DIAGNOSIS — I10 Essential (primary) hypertension: Secondary | ICD-10-CM | POA: Diagnosis not present

## 2021-11-10 DIAGNOSIS — E785 Hyperlipidemia, unspecified: Secondary | ICD-10-CM | POA: Diagnosis not present

## 2021-11-12 DIAGNOSIS — J449 Chronic obstructive pulmonary disease, unspecified: Secondary | ICD-10-CM | POA: Diagnosis not present

## 2021-11-12 DIAGNOSIS — I7389 Other specified peripheral vascular diseases: Secondary | ICD-10-CM | POA: Diagnosis not present

## 2021-11-12 DIAGNOSIS — I340 Nonrheumatic mitral (valve) insufficiency: Secondary | ICD-10-CM | POA: Diagnosis not present

## 2021-11-12 DIAGNOSIS — E785 Hyperlipidemia, unspecified: Secondary | ICD-10-CM | POA: Diagnosis not present

## 2021-11-12 DIAGNOSIS — E119 Type 2 diabetes mellitus without complications: Secondary | ICD-10-CM | POA: Diagnosis not present

## 2021-11-12 DIAGNOSIS — R69 Illness, unspecified: Secondary | ICD-10-CM | POA: Diagnosis not present

## 2021-11-12 DIAGNOSIS — K2100 Gastro-esophageal reflux disease with esophagitis, without bleeding: Secondary | ICD-10-CM | POA: Diagnosis not present

## 2021-11-12 DIAGNOSIS — R9439 Abnormal result of other cardiovascular function study: Secondary | ICD-10-CM | POA: Diagnosis not present

## 2021-11-12 DIAGNOSIS — I2510 Atherosclerotic heart disease of native coronary artery without angina pectoris: Secondary | ICD-10-CM | POA: Diagnosis not present

## 2021-11-12 DIAGNOSIS — I10 Essential (primary) hypertension: Secondary | ICD-10-CM | POA: Diagnosis not present

## 2021-11-13 DIAGNOSIS — J449 Chronic obstructive pulmonary disease, unspecified: Secondary | ICD-10-CM | POA: Diagnosis not present

## 2021-11-17 DIAGNOSIS — E1142 Type 2 diabetes mellitus with diabetic polyneuropathy: Secondary | ICD-10-CM | POA: Diagnosis not present

## 2021-12-08 DIAGNOSIS — J449 Chronic obstructive pulmonary disease, unspecified: Secondary | ICD-10-CM | POA: Diagnosis not present

## 2021-12-10 DIAGNOSIS — E1122 Type 2 diabetes mellitus with diabetic chronic kidney disease: Secondary | ICD-10-CM | POA: Diagnosis not present

## 2021-12-10 DIAGNOSIS — I129 Hypertensive chronic kidney disease with stage 1 through stage 4 chronic kidney disease, or unspecified chronic kidney disease: Secondary | ICD-10-CM | POA: Diagnosis not present

## 2021-12-10 DIAGNOSIS — N189 Chronic kidney disease, unspecified: Secondary | ICD-10-CM | POA: Diagnosis not present

## 2021-12-14 DIAGNOSIS — M79662 Pain in left lower leg: Secondary | ICD-10-CM | POA: Diagnosis not present

## 2021-12-14 DIAGNOSIS — M545 Low back pain, unspecified: Secondary | ICD-10-CM | POA: Diagnosis not present

## 2021-12-14 DIAGNOSIS — Z79899 Other long term (current) drug therapy: Secondary | ICD-10-CM | POA: Diagnosis not present

## 2021-12-14 DIAGNOSIS — M79661 Pain in right lower leg: Secondary | ICD-10-CM | POA: Diagnosis not present

## 2021-12-20 DIAGNOSIS — I70219 Atherosclerosis of native arteries of extremities with intermittent claudication, unspecified extremity: Secondary | ICD-10-CM | POA: Insufficient documentation

## 2021-12-20 DIAGNOSIS — J449 Chronic obstructive pulmonary disease, unspecified: Secondary | ICD-10-CM | POA: Insufficient documentation

## 2021-12-20 NOTE — Progress Notes (Unsigned)
MRN : [id number]  Jose Carroll is a 56 y.o. (24-Nov-1965) male who presents with chief complaint of check circulation.  History of Present Illness:    The patient is seen for evaluation of painful lower extremities and diminished pulses. Patient notes the pain is always associated with activity and is very consistent day today. Typically, the pain occurs at less than one block, progress is as activity continues to the point that the patient must stop walking. Resting including standing still for several minutes allows the patient to walk a similar distance before being forced to stop again. Uneven terrain and inclines shorten the distance. The pain has been progressive over the past several years. The patient denies any abrupt changes in claudication symptoms.  The patient states the inability to walk is causing problems with daily activities.  The patient denies rest pain or dangling of an extremity off the side of the bed during the night for relief. No open wounds or sores at this time. No prior interventions or surgeries.  No history of back problems or DJD of the lumbar sacral spine.   The patient's blood pressure has been stable and relatively well controlled. The patient denies amaurosis fugax or recent TIA symptoms. There are no recent neurological changes noted. The patient denies history of DVT, PE or superficial thrombophlebitis. The patient denies recent episodes of angina or shortness of breath.   No outpatient medications have been marked as taking for the 12/21/21 encounter (Appointment) with Delana Meyer, Dolores Lory, MD.    Past Medical History:  Diagnosis Date   Anginal pain ([place])    Arthritis    Chronic pain    Chronic, continuous use of opioids    CKD (chronic kidney disease)    Claudication of both lower extremities (HCC)    COPD (chronic obstructive pulmonary disease) ([place])    Coronary artery disease    a.) LHC 09/10/2014: 50% mLCx, 50% mLAD,  100% p-m RCA; good collaterals from LAD; med mgmt. b.) LHC 06/26/2015: 100% mRCA, 30% dLCx, 40% dLAD; good collaterals from LAD/LCx; med mgmt. c.) LHC 10/28/2017: EF 60%; 100% mRCA, 95% m-dLCx, 30% dLCx; PCI placing a 2.5 x 15 mm Resolute Onyx DES x1 to mLCx. Chronic RCA occlusion with L-R collaterals.   Erectile dysfunction    GERD (gastroesophageal reflux disease)    HLD (hyperlipidemia)    Hypertension    Long term current use of antithrombotics/antiplatelets    a.) ASA + ticagrelor   Peripheral edema    PVD (peripheral vascular disease) (HCC)    Shortness of breath    T2DM (type 2 diabetes mellitus) ([place])    Vitamin D deficiency     Past Surgical History:  Procedure Laterality Date   BACK SURGERY     4   CARDIAC CATHETERIZATION N/A 09/10/2014   Procedure: Left Heart Cath and Coronary Angiogram ;  Surgeon: Dionisio David, MD;  Location: [place] [facility];  Service: Cardiovascular;  Laterality: N/A;   CARDIAC CATHETERIZATION Left 06/26/2015   Procedure: Left Heart Cath and Coronary Angiography;  Surgeon: Dionisio David, MD;  Location: [place] [facility];  Service: Cardiovascular;  Laterality: Left;   CORONARY STENT INTERVENTION N/A 10/28/2017   Procedure: CORONARY STENT INTERVENTION (2.5 x 15 mm Resolute Onyx DES x 1 to mLCx);  Surgeon: Wellington Hampshire, MD;  Location: [place];  Service: Cardiovascular;  Laterality: N/A;  FETAL SURGERY FOR CONGENITAL HERNIA  2012   FINGER SURGERY     sixth finger removed   HERNIA REPAIR     LEFT HEART CATH AND CORONARY ANGIOGRAPHY Left 10/28/2017   Procedure: LEFT HEART CATH AND CORONARY ANGIOGRAPHY;  Surgeon: Dionisio David, MD;  Location: [facility];  Service: Cardiovascular;  Laterality: Left;   PULSE GENERATOR IMPLANT N/A 12/29/2020   Procedure: PULSE GENERATOR IMPLANT ([employer]TRONIC);  Surgeon: Deetta Perla, MD;  Location: [facility];  Service: Neurosurgery;  Laterality: N/A;   SUBCLAVIAN ARTERY STENT     THORACIC  LAMINECTOMY FOR SPINAL CORD STIMULATOR N/A 12/22/2020   Procedure: THORACIC LAMINECTOMY FOR SPINAL CORD STIMULATOR PADDLE TRIAL (MEDTRONIC);  Surgeon: Deetta Perla, MD;  Location: [facility];  Service: Neurosurgery;  Laterality: N/A;   TONSILLECTOMY      Social History Social History   Tobacco Use   Smoking status: Former    Packs/day: 1.00    Years: 25.00    Total pack years: 25.00    Types: Cigarettes    Quit date: 07/04/2015    Years since quitting: 6.4   Smokeless tobacco: Former    Quit date: 07/01/2015  Vaping Use   Vaping Use: Every day   Devices: zero nicotine  Substance Use Topics   Alcohol use: No    Alcohol/week: 0.0 standard drinks of alcohol   Drug use: No    Family History Family History  Problem Relation Age of Onset   Cancer Mother    Diabetes Mother    Anuerysm Father     Allergies  Allergen Reactions   Naproxen Rash     REVIEW OF SYSTEMS (Negative unless checked)  Constitutional: '[]'$ Weight loss  '[]'$ Fever  '[]'$ Chills Cardiac: '[]'$ Chest pain   '[]'$ Chest pressure   '[]'$ Palpitations   '[]'$ Shortness of breath when laying flat   '[]'$ Shortness of breath with exertion. Vascular:  '[x]'$ Pain in legs with walking   '[]'$ Pain in legs at rest  '[]'$ History of DVT   '[]'$ Phlebitis   '[]'$ Swelling in legs   '[]'$ Varicose veins   '[]'$ Non-healing ulcers Pulmonary:   '[]'$ Uses home oxygen   '[]'$ Productive cough   '[]'$ Hemoptysis   '[]'$ Wheeze  '[x]'$ COPD   '[]'$ Asthma Neurologic:  '[]'$ Dizziness   '[]'$ Seizures   '[]'$ History of stroke   '[]'$ History of TIA  '[]'$ Aphasia   '[]'$ Vissual changes   '[]'$ Weakness or numbness in arm   '[x]'$ Weakness or numbness in leg Musculoskeletal:   '[]'$ Joint swelling   '[x]'$ Joint pain   '[x]'$ Low back pain Hematologic:  '[]'$ Easy bruising  '[]'$ Easy bleeding   '[]'$ Hypercoagulable state   '[]'$ Anemic Gastrointestinal:  '[]'$ Diarrhea   '[]'$ Vomiting  '[]'$ Gastroesophageal reflux/heartburn   '[]'$ Difficulty swallowing. Genitourinary:  '[]'$ Chronic kidney disease   '[]'$ Difficult urination  '[]'$ Frequent urination   '[]'$ Blood in urine Skin:  '[]'$ Rashes    '[]'$ Ulcers  Psychological:  '[]'$ History of anxiety   '[]'$  History of major depression.  Physical Examination  There were no vitals filed for this visit. There is no height or weight on file to calculate BMI. Gen: WD/WN, NAD Head: [place]/AT, No temporalis wasting.  Ear/Nose/Throat: Hearing grossly intact, nares w/o erythema or drainage Eyes: PER, EOMI, sclera nonicteric.  Neck: Supple, no masses.  No bruit or JVD.  Pulmonary:  Good air movement, no audible wheezing, no use of accessory muscles.  Cardiac: RRR, normal S1, S2, no Murmurs. Vascular:  mild trophic changes, no open wounds Vessel Right Left  Radial Palpable Palpable  PT Not Palpable Not Palpable  DP Not Palpable Not Palpable  Gastrointestinal: soft, non-distended. No  guarding/no peritoneal signs.  Musculoskeletal: M/S 5/5 throughout.  No visible deformity.  Neurologic: CN 2-12 intact. Pain and light touch intact in extremities.  Symmetrical.  Speech is fluent. Motor exam as listed above. Psychiatric: Judgment intact, Mood & affect appropriate for pt's clinical situation. Dermatologic: No rashes or ulcers noted.  No changes consistent with cellulitis.   CBC Lab Results  Component Value Date   WBC 8.9 12/22/2020   HGB 16.6 12/22/2020   HCT 49.6 12/22/2020   MCV 88.6 12/22/2020   PLT 137 (L) 12/22/2020    BMET    Component Value Date/Time   NA 140 12/22/2020 1311   NA 136 05/05/2011 2221   K 3.4 (L) 12/22/2020 1311   K 3.8 05/05/2011 2221   CL 103 12/22/2020 1311   CL 101 05/05/2011 2221   CO2 30 12/22/2020 1311   CO2 24 05/05/2011 2221   GLUCOSE 112 (H) 12/22/2020 1311   GLUCOSE 175 (H) 05/05/2011 2221   BUN 9 12/22/2020 1311   BUN 4 (L) 05/05/2011 2221   CREATININE 1.10 12/22/2020 1311   CREATININE 1.12 05/05/2011 2221   CALCIUM 9.7 12/22/2020 1311   CALCIUM 8.9 05/05/2011 2221   GFRNONAA >60 12/22/2020 1311   GFRNONAA >60 05/05/2011 2221   GFRAA >60 10/29/2017 0333   GFRAA >60 05/05/2011 2221   CrCl cannot  be calculated (Patient's most recent lab result is older than the maximum 21 days allowed.).  COAG Lab Results  Component Value Date   INR 1.0 12/22/2020   INR 0.89 02/07/2013   INR 0.9 05/05/2011    Radiology No results found.   Assessment/Plan There are no diagnoses linked to this encounter.   Hortencia Pilar, MD  12/20/2021 2:44 PM

## 2021-12-21 ENCOUNTER — Encounter (INDEPENDENT_AMBULATORY_CARE_PROVIDER_SITE_OTHER): Payer: Self-pay | Admitting: Vascular Surgery

## 2021-12-21 ENCOUNTER — Ambulatory Visit (INDEPENDENT_AMBULATORY_CARE_PROVIDER_SITE_OTHER): Payer: Medicare HMO | Admitting: Vascular Surgery

## 2021-12-21 VITALS — Ht 70.0 in | Wt 203.0 lb

## 2021-12-21 DIAGNOSIS — I70213 Atherosclerosis of native arteries of extremities with intermittent claudication, bilateral legs: Secondary | ICD-10-CM | POA: Diagnosis not present

## 2021-12-21 DIAGNOSIS — M5136 Other intervertebral disc degeneration, lumbar region: Secondary | ICD-10-CM

## 2021-12-21 DIAGNOSIS — I2510 Atherosclerotic heart disease of native coronary artery without angina pectoris: Secondary | ICD-10-CM | POA: Diagnosis not present

## 2021-12-21 DIAGNOSIS — J449 Chronic obstructive pulmonary disease, unspecified: Secondary | ICD-10-CM

## 2021-12-21 DIAGNOSIS — M51369 Other intervertebral disc degeneration, lumbar region without mention of lumbar back pain or lower extremity pain: Secondary | ICD-10-CM

## 2021-12-29 DIAGNOSIS — E119 Type 2 diabetes mellitus without complications: Secondary | ICD-10-CM | POA: Diagnosis not present

## 2022-01-08 DIAGNOSIS — J449 Chronic obstructive pulmonary disease, unspecified: Secondary | ICD-10-CM | POA: Diagnosis not present

## 2022-01-27 DIAGNOSIS — E119 Type 2 diabetes mellitus without complications: Secondary | ICD-10-CM | POA: Diagnosis not present

## 2022-02-01 DIAGNOSIS — M79604 Pain in right leg: Secondary | ICD-10-CM | POA: Diagnosis not present

## 2022-02-01 DIAGNOSIS — F1120 Opioid dependence, uncomplicated: Secondary | ICD-10-CM | POA: Diagnosis not present

## 2022-02-01 DIAGNOSIS — M79662 Pain in left lower leg: Secondary | ICD-10-CM | POA: Diagnosis not present

## 2022-02-01 DIAGNOSIS — Z79891 Long term (current) use of opiate analgesic: Secondary | ICD-10-CM | POA: Diagnosis not present

## 2022-02-01 DIAGNOSIS — M545 Low back pain, unspecified: Secondary | ICD-10-CM | POA: Diagnosis not present

## 2022-02-02 DIAGNOSIS — J449 Chronic obstructive pulmonary disease, unspecified: Secondary | ICD-10-CM | POA: Diagnosis not present

## 2022-02-15 ENCOUNTER — Other Ambulatory Visit: Payer: Self-pay | Admitting: Nurse Practitioner

## 2022-02-15 DIAGNOSIS — E1165 Type 2 diabetes mellitus with hyperglycemia: Secondary | ICD-10-CM

## 2022-02-17 ENCOUNTER — Other Ambulatory Visit: Payer: Self-pay | Admitting: Cardiovascular Disease

## 2022-02-17 DIAGNOSIS — R079 Chest pain, unspecified: Secondary | ICD-10-CM

## 2022-02-19 ENCOUNTER — Other Ambulatory Visit: Payer: Self-pay | Admitting: Nurse Practitioner

## 2022-02-19 ENCOUNTER — Other Ambulatory Visit: Payer: Medicare HMO

## 2022-02-19 DIAGNOSIS — N189 Chronic kidney disease, unspecified: Secondary | ICD-10-CM | POA: Diagnosis not present

## 2022-02-19 DIAGNOSIS — E1122 Type 2 diabetes mellitus with diabetic chronic kidney disease: Secondary | ICD-10-CM | POA: Diagnosis not present

## 2022-02-19 DIAGNOSIS — E785 Hyperlipidemia, unspecified: Secondary | ICD-10-CM | POA: Diagnosis not present

## 2022-02-19 DIAGNOSIS — I10 Essential (primary) hypertension: Secondary | ICD-10-CM | POA: Diagnosis not present

## 2022-02-20 LAB — TSH: TSH: 1.62 u[IU]/mL (ref 0.450–4.500)

## 2022-02-20 LAB — COMPREHENSIVE METABOLIC PANEL
ALT: 31 IU/L (ref 0–44)
AST: 23 IU/L (ref 0–40)
Albumin/Globulin Ratio: 1.8 (ref 1.2–2.2)
Albumin: 4.4 g/dL (ref 3.8–4.9)
Alkaline Phosphatase: 130 IU/L — ABNORMAL HIGH (ref 44–121)
BUN/Creatinine Ratio: 8 — ABNORMAL LOW (ref 9–20)
BUN: 11 mg/dL (ref 6–24)
Bilirubin Total: 0.4 mg/dL (ref 0.0–1.2)
CO2: 26 mmol/L (ref 20–29)
Calcium: 9.7 mg/dL (ref 8.7–10.2)
Chloride: 101 mmol/L (ref 96–106)
Creatinine, Ser: 1.37 mg/dL — ABNORMAL HIGH (ref 0.76–1.27)
Globulin, Total: 2.5 g/dL (ref 1.5–4.5)
Glucose: 126 mg/dL — ABNORMAL HIGH (ref 70–99)
Potassium: 4.2 mmol/L (ref 3.5–5.2)
Sodium: 142 mmol/L (ref 134–144)
Total Protein: 6.9 g/dL (ref 6.0–8.5)
eGFR: 61 mL/min/{1.73_m2} (ref >59)

## 2022-02-20 LAB — LIPID PANEL W/O CHOL/HDL RATIO
Cholesterol, Total: 113 mg/dL (ref 100–199)
HDL: 34 mg/dL — ABNORMAL LOW (ref >39)
LDL Chol Calc (NIH): 34 mg/dL (ref 0–99)
Triglycerides: 302 mg/dL — ABNORMAL HIGH (ref 0–149)
VLDL Cholesterol Cal: 45 mg/dL — ABNORMAL HIGH (ref 5–40)

## 2022-02-20 LAB — HGB A1C W/O EAG: Hgb A1c MFr Bld: 6.8 % — ABNORMAL HIGH (ref 4.8–5.6)

## 2022-02-22 ENCOUNTER — Ambulatory Visit (INDEPENDENT_AMBULATORY_CARE_PROVIDER_SITE_OTHER): Payer: Medicaid Other | Admitting: Nurse Practitioner

## 2022-02-22 ENCOUNTER — Encounter: Payer: Self-pay | Admitting: Nurse Practitioner

## 2022-02-22 ENCOUNTER — Other Ambulatory Visit: Payer: Self-pay | Admitting: Nurse Practitioner

## 2022-02-22 ENCOUNTER — Other Ambulatory Visit: Payer: Self-pay | Admitting: Cardiovascular Disease

## 2022-02-22 VITALS — BP 110/80 | HR 89 | Ht 70.0 in | Wt 198.0 lb

## 2022-02-22 DIAGNOSIS — E1165 Type 2 diabetes mellitus with hyperglycemia: Secondary | ICD-10-CM | POA: Diagnosis not present

## 2022-02-22 DIAGNOSIS — E0841 Diabetes mellitus due to underlying condition with diabetic mononeuropathy: Secondary | ICD-10-CM

## 2022-02-22 DIAGNOSIS — M5136 Other intervertebral disc degeneration, lumbar region: Secondary | ICD-10-CM | POA: Diagnosis not present

## 2022-02-22 DIAGNOSIS — D696 Thrombocytopenia, unspecified: Secondary | ICD-10-CM | POA: Diagnosis not present

## 2022-02-22 DIAGNOSIS — E1342 Other specified diabetes mellitus with diabetic polyneuropathy: Secondary | ICD-10-CM | POA: Diagnosis not present

## 2022-02-22 DIAGNOSIS — I70213 Atherosclerosis of native arteries of extremities with intermittent claudication, bilateral legs: Secondary | ICD-10-CM

## 2022-02-22 DIAGNOSIS — J449 Chronic obstructive pulmonary disease, unspecified: Secondary | ICD-10-CM

## 2022-02-22 DIAGNOSIS — E1100 Type 2 diabetes mellitus with hyperosmolarity without nonketotic hyperglycemic-hyperosmolar coma (NKHHC): Secondary | ICD-10-CM

## 2022-02-22 DIAGNOSIS — R197 Diarrhea, unspecified: Secondary | ICD-10-CM | POA: Diagnosis not present

## 2022-02-22 DIAGNOSIS — K219 Gastro-esophageal reflux disease without esophagitis: Secondary | ICD-10-CM

## 2022-02-22 MED ORDER — DIPHENOXYLATE-ATROPINE 2.5-0.025 MG PO TABS
1.0000 | ORAL_TABLET | Freq: Four times a day (QID) | ORAL | 1 refills | Status: DC | PRN
Start: 2022-02-22 — End: 2022-04-15

## 2022-02-22 MED ORDER — "PEN NEEDLES 5/16"" 30G X 8 MM MISC": 100.0000 | 1 refills | Status: AC

## 2022-02-22 MED ORDER — NITROGLYCERIN 0.4 MG SL SUBL: 0.4000 mg | SUBLINGUAL_TABLET | SUBLINGUAL | 0 refills | Status: DC | PRN

## 2022-02-22 NOTE — Progress Notes (Signed)
Established Patient Office Visit  Subjective:  Patient ID: Jose Carroll, male    DOB: 10-01-1965  Age: 57 y.o. MRN: [id number]  Chief Complaint  Patient presents with   Follow-up    3 month follow up    Follow up and lab results review.  No concerns today other than getting meds he needs.     Past Medical History:  Diagnosis Date   Anginal pain (HCC)    Arthritis    Chronic pain    Chronic, continuous use of opioids    CKD (chronic kidney disease)    Claudication of both lower extremities (HCC)    COPD (chronic obstructive pulmonary disease) (HCC)    Coronary artery disease    a.) LHC 09/10/2014: 50% mLCx, 50% mLAD, 100% p-m RCA; good collaterals from LAD; med mgmt. b.) LHC 06/26/2015: 100% mRCA, 30% dLCx, 40% dLAD; good collaterals from LAD/LCx; med mgmt. c.) LHC 10/28/2017: EF 60%; 100% mRCA, 95% m-dLCx, 30% dLCx; PCI placing a 2.5 x 15 mm Resolute Onyx DES x1 to mLCx. Chronic RCA occlusion with L-R collaterals.   Erectile dysfunction    GERD (gastroesophageal reflux disease)    HLD (hyperlipidemia)    Hypertension    Long term current use of antithrombotics/antiplatelets    a.) ASA + ticagrelor   Peripheral edema    PVD (peripheral vascular disease) (HCC)    Shortness of breath    T2DM (type 2 diabetes mellitus) ([place])    Vitamin D deficiency     Social History   Socioeconomic History   Marital status: Married    Spouse name: Not on file   Number of children: Not on file   Years of education: Not on file   Highest education level: Not on file  Occupational History   Not on file  Tobacco Use   Smoking status: Every Day    Packs/day: 1.00    Years: 25.00    Total pack years: 25.00    Types: Cigarettes    Last attempt to quit: 07/04/2015    Years since quitting: 6.6   Smokeless tobacco: Former    Quit date: 07/01/2015  Vaping Use   Vaping Use: Every day   Devices: zero nicotine  Substance and Sexual Activity   Alcohol use: No    Alcohol/week: 0.0  standard drinks of alcohol   Drug use: No   Sexual activity: Not on file  Other Topics Concern   Not on file  Social History Narrative   Not on file   Social Determinants of Health   Financial Resource Strain: Not on file  Food Insecurity: Not on file  Transportation Needs: Not on file  Physical Activity: Not on file  Stress: Not on file  Social Connections: Not on file  Intimate Partner Violence: Not on file    Family History  Problem Relation Age of Onset   Cancer Mother    Diabetes Mother    Anuerysm Father     Allergies  Allergen Reactions   Naproxen Rash    Review of Systems  Constitutional: Negative.   HENT: Negative.    Eyes: Negative.   Respiratory:  Positive for cough. Negative for sputum production.   Cardiovascular: Negative.   Gastrointestinal: Negative.   Genitourinary: Negative.   Musculoskeletal:  Positive for myalgias.  Skin: Negative.   Neurological: Negative.   Endo/Heme/Allergies: Negative.   Psychiatric/Behavioral: Negative.         Objective:   BP 110/80   Pulse 89  Ht 5' 10"$  (1.778 m)   Wt 198 lb (89.8 kg)   SpO2 96%   BMI 28.41 kg/m   Vitals:   02/22/22 1034  BP: 110/80  Pulse: 89  Height: 5' 10"$  (1.778 m)  Weight: 198 lb (89.8 kg)  SpO2: 96%  BMI (Calculated): 28.41    Physical Exam Constitutional:      Appearance: Normal appearance.  HENT:     Head: Normocephalic.     Nose: Nose normal.  Eyes:     Pupils: Pupils are equal, round, and reactive to light.  Cardiovascular:     Rate and Rhythm: Normal rate and regular rhythm.  Pulmonary:     Breath sounds: Normal breath sounds.  Abdominal:     General: Bowel sounds are normal.     Palpations: Abdomen is soft.  Musculoskeletal:        General: Tenderness present.     Cervical back: Neck supple.  Skin:    General: Skin is warm and dry.  Neurological:     Mental Status: He is alert and oriented to person, place, and time.  Psychiatric:        Mood and Affect:  Mood normal.        Behavior: Behavior normal.      No results found for any visits on 02/22/22.  Recent Results (from the past 2160 hour(s))  Comprehensive metabolic panel     Status: Abnormal   Collection Time: 02/19/22 10:44 AM  Result Value Ref Range   Glucose 126 (H) 70 - 99 mg/dL   BUN 11 6 - 24 mg/dL   Creatinine, Ser 1.37 (H) 0.76 - 1.27 mg/dL   eGFR 61 >59 mL/min/1.73   BUN/Creatinine Ratio 8 (L) 9 - 20   Sodium 142 134 - 144 mmol/L   Potassium 4.2 3.5 - 5.2 mmol/L   Chloride 101 96 - 106 mmol/L   CO2 26 20 - 29 mmol/L   Calcium 9.7 8.7 - 10.2 mg/dL   Total Protein 6.9 6.0 - 8.5 g/dL   Albumin 4.4 3.8 - 4.9 g/dL   Globulin, Total 2.5 1.5 - 4.5 g/dL   Albumin/Globulin Ratio 1.8 1.2 - 2.2   Bilirubin Total 0.4 0.0 - 1.2 mg/dL   Alkaline Phosphatase 130 (H) 44 - 121 IU/L   AST 23 0 - 40 IU/L   ALT 31 0 - 44 IU/L  Lipid Panel w/o Chol/HDL Ratio     Status: Abnormal   Collection Time: 02/19/22 10:44 AM  Result Value Ref Range   Cholesterol, Total 113 100 - 199 mg/dL   Triglycerides 302 (H) 0 - 149 mg/dL   HDL 34 (L) >39 mg/dL   VLDL Cholesterol Cal 45 (H) 5 - 40 mg/dL   LDL Chol Calc (NIH) 34 0 - 99 mg/dL  Hgb A1c w/o eAG     Status: Abnormal   Collection Time: 02/19/22 10:44 AM  Result Value Ref Range   Hgb A1c MFr Bld 6.8 (H) 4.8 - 5.6 %    Comment:          Prediabetes: 5.7 - 6.4          Diabetes: >6.4          Glycemic control for adults with diabetes: <7.0   TSH     Status: None   Collection Time: 02/19/22 10:44 AM  Result Value Ref Range   TSH 1.620 0.450 - 4.500 uIU/mL      Assessment & Plan:   Problem List  Items Addressed This Visit       Cardiovascular and Mediastinum   Atherosclerosis of native arteries of extremity with intermittent claudication (HCC)     Respiratory   COPD (chronic obstructive pulmonary disease) (HCC)     Endocrine   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   Total time spent: 25 minutes  Evern Bio, NP  02/22/2022

## 2022-02-22 NOTE — Patient Instructions (Addendum)
1) Refills 2) Lab reviewed, A1c is at goal at 6.8% 3) Discussed nebs use and dry heat for chronic cough 4) Follow up appt in 4 months, fasting labs prior

## 2022-02-24 DIAGNOSIS — J449 Chronic obstructive pulmonary disease, unspecified: Secondary | ICD-10-CM | POA: Diagnosis not present

## 2022-03-08 DIAGNOSIS — M545 Low back pain, unspecified: Secondary | ICD-10-CM | POA: Diagnosis not present

## 2022-03-08 DIAGNOSIS — F1120 Opioid dependence, uncomplicated: Secondary | ICD-10-CM | POA: Diagnosis not present

## 2022-03-08 DIAGNOSIS — Z79891 Long term (current) use of opiate analgesic: Secondary | ICD-10-CM | POA: Diagnosis not present

## 2022-03-08 DIAGNOSIS — M79662 Pain in left lower leg: Secondary | ICD-10-CM | POA: Diagnosis not present

## 2022-03-08 DIAGNOSIS — M79604 Pain in right leg: Secondary | ICD-10-CM | POA: Diagnosis not present

## 2022-03-12 ENCOUNTER — Ambulatory Visit (INDEPENDENT_AMBULATORY_CARE_PROVIDER_SITE_OTHER): Payer: Medicare HMO | Admitting: Cardiovascular Disease

## 2022-03-12 ENCOUNTER — Encounter: Payer: Self-pay | Admitting: Cardiovascular Disease

## 2022-03-12 VITALS — BP 120/60 | HR 84 | Ht 70.0 in | Wt 198.8 lb

## 2022-03-12 DIAGNOSIS — I70211 Atherosclerosis of native arteries of extremities with intermittent claudication, right leg: Secondary | ICD-10-CM | POA: Diagnosis not present

## 2022-03-12 DIAGNOSIS — R42 Dizziness and giddiness: Secondary | ICD-10-CM | POA: Diagnosis not present

## 2022-03-12 DIAGNOSIS — I739 Peripheral vascular disease, unspecified: Secondary | ICD-10-CM

## 2022-03-12 DIAGNOSIS — E782 Mixed hyperlipidemia: Secondary | ICD-10-CM | POA: Diagnosis not present

## 2022-03-12 DIAGNOSIS — I2510 Atherosclerotic heart disease of native coronary artery without angina pectoris: Secondary | ICD-10-CM

## 2022-03-12 NOTE — Assessment & Plan Note (Addendum)
SOB on exertion and had PCI/DES OM, do stress test

## 2022-03-12 NOTE — Progress Notes (Signed)
Cardiology Office Note   Date:  03/12/2022   ID:  Jose Carroll, DOB 22-Nov-1965, MRN [id number]  PCP:  Evern Bio, NP  Cardiologist:  Neoma Laming, MD      History of Present Illness: Jose Carroll is a 57 y.o. male who presents for  Chief Complaint  Patient presents with   Follow-up    4 month fu    Leg Pain  The incident occurred 3 to 5 days ago. There was no injury mechanism. The pain is present in the left leg and right leg. The quality of the pain is described as aching.      Past Medical History:  Diagnosis Date   Anginal pain (HCC)    Arthritis    Chronic pain    Chronic, continuous use of opioids    CKD (chronic kidney disease)    Claudication of both lower extremities (HCC)    COPD (chronic obstructive pulmonary disease) (HCC)    Coronary artery disease    a.) LHC 09/10/2014: 50% mLCx, 50% mLAD, 100% p-m RCA; good collaterals from LAD; med mgmt. b.) LHC 06/26/2015: 100% mRCA, 30% dLCx, 40% dLAD; good collaterals from LAD/LCx; med mgmt. c.) LHC 10/28/2017: EF 60%; 100% mRCA, 95% m-dLCx, 30% dLCx; PCI placing a 2.5 x 15 mm Resolute Onyx DES x1 to mLCx. Chronic RCA occlusion with L-R collaterals.   Erectile dysfunction    GERD (gastroesophageal reflux disease)    HLD (hyperlipidemia)    Hypertension    Long term current use of antithrombotics/antiplatelets    a.) ASA + ticagrelor   Peripheral edema    PVD (peripheral vascular disease) (HCC)    Shortness of breath    T2DM (type 2 diabetes mellitus) ([place])    Vitamin D deficiency      Past Surgical History:  Procedure Laterality Date   BACK SURGERY     4   CARDIAC CATHETERIZATION N/A 09/10/2014   Procedure: Left Heart Cath and Coronary Angiogram ;  Surgeon: Dionisio David, MD;  Location: [place] [facility];  Service: Cardiovascular;  Laterality: N/A;   CARDIAC CATHETERIZATION Left 06/26/2015   Procedure: Left Heart Cath and Coronary Angiography;  Surgeon: Dionisio David, MD;  Location: [place] [facility];  Service: Cardiovascular;  Laterality: Left;   CORONARY STENT INTERVENTION N/A 10/28/2017   Procedure: CORONARY STENT INTERVENTION (2.5 x 15 mm Resolute Onyx DES x 1 to mLCx);  Surgeon: Wellington Hampshire, MD;  Location: [place] [facility];  Service: Cardiovascular;  Laterality: N/A;   FETAL SURGERY FOR CONGENITAL HERNIA  2012   FINGER SURGERY     sixth finger removed   HERNIA REPAIR     LEFT HEART CATH AND CORONARY ANGIOGRAPHY Left 10/28/2017   Procedure: LEFT HEART CATH AND CORONARY ANGIOGRAPHY;  Surgeon: Dionisio David, MD;  Location: [place] [facility];  Service: Cardiovascular;  Laterality: Left;   PULSE GENERATOR IMPLANT N/A 12/29/2020   Procedure: PULSE GENERATOR IMPLANT (MEDTRONIC);  Surgeon: Deetta Perla, MD;  Location: [facility];  Service: Neurosurgery;  Laterality: N/A;   SUBCLAVIAN ARTERY STENT     THORACIC LAMINECTOMY FOR SPINAL CORD STIMULATOR N/A 12/22/2020   Procedure: THORACIC LAMINECTOMY FOR SPINAL CORD STIMULATOR PADDLE TRIAL (MEDTRONIC);  Surgeon: Deetta Perla, MD;  Location: [facility];  Service: Neurosurgery;  Laterality: N/A;   TONSILLECTOMY       Current Outpatient Medications  Medication Sig Dispense Refill   albuterol (PROVENTIL HFA;VENTOLIN HFA) 108 (90 BASE) MCG/ACT inhaler  Inhale 2 puffs into the lungs every 6 (six) hours as needed for wheezing or shortness of breath.      atorvastatin (LIPITOR) 80 MG tablet TAKE 1 TABLET BY MOUTH EVERY DAY 90 tablet 1   budesonide (PULMICORT) 0.25 MG/2ML nebulizer solution Take 0.25 mg by nebulization 2 (two) times daily.     cetirizine (ZYRTEC) 10 MG tablet Take 10 mg by mouth at bedtime.     diphenoxylate-atropine (LOMOTIL) 2.5-0.025 MG tablet Take 1 tablet by mouth 4 (four) times daily as needed for diarrhea or loose stools. 30 tablet 1   Dulaglutide (TRULICITY) A999333 0000000 SOPN INJECT 0.5 ML SUBCUTANEOUSLY IN ABDOMINAL REGION WEEKLY, ROTATING SITES E11.65 2 mL 11   empagliflozin (JARDIANCE) 25 MG TABS  tablet Take 25 mg by mouth in the morning.     formoterol (PERFOROMIST) 20 MCG/2ML nebulizer solution Take 20 mcg by nebulization 2 (two) times daily.     furosemide (LASIX) 40 MG tablet Take 40 mg by mouth daily as needed for edema.     gabapentin (NEURONTIN) 600 MG tablet Take 1,200 mg by mouth 3 (three) times daily.      glipiZIDE (GLUCOTROL XL) 5 MG 24 hr tablet TAKE 1 TABLET BY MOUTH TWICE A DAY 180 tablet 3   icosapent Ethyl (VASCEPA) 1 g capsule Take 1 g by mouth 2 (two) times daily.     insulin degludec (TRESIBA) 100 UNIT/ML FlexTouch Pen Inject 45 Units into the skin at bedtime.     isosorbide mononitrate (IMDUR) 120 MG 24 hr tablet Take 120 mg by mouth in the morning and at bedtime.     losartan (COZAAR) 25 MG tablet TAKE 1 TABLET BY MOUTH DAILY FOR KIDNEY FUNCTION 90 tablet 3   metoprolol succinate (TOPROL-XL) 100 MG 24 hr tablet Take 100 mg by mouth daily. Take with or immediately following a meal.     Multiple Vitamin (MULTIVITAMIN WITH MINERALS) TABS tablet Take 1 tablet by mouth daily.     nitroGLYCERIN (NITROSTAT) 0.3 MG SL tablet PLACE 1 TABLET UNDER TONGUE AS NEEDED FOR CHEST PAIN EVERY 5 MINUTES *3 DOSES/CALL 911* 100 tablet 2   oxyCODONE ER 9 MG C12A Take 9 mg by mouth every 12 (twelve) hours.      ranolazine (RANEXA) 1000 MG SR tablet Take 1,000 mg by mouth 2 (two) times daily.     tamsulosin (FLOMAX) 0.4 MG CAPS capsule Take 0.4 mg by mouth daily.     ticagrelor (BRILINTA) 60 MG TABS tablet Take 60 mg by mouth 2 (two) times daily.     lubiprostone (AMITIZA) 24 MCG capsule Take 24 mcg by mouth daily with breakfast.     nitroGLYCERIN (NITROSTAT) 0.4 MG SL tablet Place 1 tablet (0.4 mg total) under the tongue every 5 (five) minutes as needed for chest pain. (Patient not taking: Reported on 03/12/2022) 30 tablet 0   pantoprazole (PROTONIX) 40 MG tablet TAKE 1 TABLET BY MOUTH DAILY IN AM 1 HOUR PRIOR TO BREAKFAST 90 tablet 3   tiZANidine (ZANAFLEX) 4 MG tablet Take 4 mg by mouth in  the morning, at noon, in the evening, and at bedtime.     No current facility-administered medications for this visit.    Allergies:   Naproxen    Social History:   reports that he has been smoking cigarettes. He has a 25.00 pack-year smoking history. He quit smokeless tobacco use about 6 years ago. He reports that he does not drink alcohol and does not use drugs.  Family History:  family history includes Anuerysm in his father; Cancer in his mother; Diabetes in his mother.    ROS:     Review of Systems  Constitutional: Negative.   HENT: Negative.    Eyes: Negative.   Respiratory: Negative.    Gastrointestinal: Negative.   Genitourinary: Negative.   Musculoskeletal: Negative.   Skin: Negative.   Neurological: Negative.   Endo/Heme/Allergies: Negative.   Psychiatric/Behavioral: Negative.    All other systems reviewed and are negative.     All other systems are reviewed and negative.    PHYSICAL EXAM: VS:  BP 120/60   Pulse 84   Ht '5\' 10"'$  (1.778 m)   Wt 198 lb 12.8 oz (90.2 kg)   SpO2 95%   BMI 28.52 kg/m  , BMI Body mass index is 28.52 kg/m. Last weight:  Wt Readings from Last 3 Encounters:  03/12/22 198 lb 12.8 oz (90.2 kg)  02/22/22 198 lb (89.8 kg)  12/21/21 203 lb (92.1 kg)     Physical Exam Vitals reviewed.  Constitutional:      Appearance: Normal appearance. He is normal weight.  HENT:     Head: Normocephalic.     Nose: Nose normal.     Mouth/Throat:     Mouth: Mucous membranes are moist.  Eyes:     Pupils: Pupils are equal, round, and reactive to light.  Cardiovascular:     Rate and Rhythm: Normal rate and regular rhythm.     Pulses: Normal pulses.     Heart sounds: Normal heart sounds.  Pulmonary:     Effort: Pulmonary effort is normal.  Abdominal:     General: Abdomen is flat. Bowel sounds are normal.  Musculoskeletal:        General: Normal range of motion.     Cervical back: Normal range of motion.  Skin:    General: Skin is warm.   Neurological:     General: No focal deficit present.     Mental Status: He is alert.  Psychiatric:        Mood and Affect: Mood normal.      REASON FOR VISIT  Visit for: Echocardiogram/CP R07.9  Sex:   Male   wt= 198   lbs.  BP=134/90  Height= 70   inches.        TESTS  Imaging: Echocardiogram:  An echocardiogram in (2-d) mode was performed and in Doppler mode with color flow velocity mapping was performed. The aortic valve cusps are abnormal 1.3   cm, flow velocity 1.7    m/s, and systolic calculated mean flow gradient 5  mmHg. Mitral valve diastolic peak flow velocity E .74     m/s and E/A ratio 1.1. Aortic root diameter 2.7   cm. The LVOT internal diameter 2.8   cm and flow velocity was abnormal 1.17   m/s. LV systolic dimension 0000000  cm, diastolic 123456 cm, posterior wall thickness 1.07   cm, fractional shortening 52.3 %, and EF 84  %. IVS thickness 1.34   cm. LA dimension 3.0 cm. Mitral Valve has Trace Regurgitation.     ASSESSMENT  Technically difficult study due to body habitus.  Normal chamber sizes.  Normal left ventricular systolic function.  Mild left ventricular hypertrophy with GRADE 1 (relaxation abnormality) diastolic dysfunction.  Normal right ventricular systolic function.  Normal right ventricular diastolic function.  Normal left ventricular wall motion.  Normal right ventricular wall motion.  Normal pulmonary artery pressure.  Trace mitral regurgitation.  No  pericardial effusion.  No LVH.     THERAPY   Referring physician: Dionisio David  Sonographer: Neoma Laming.      Neoma Laming MD  Electronically signed by: Neoma Laming     Date: 11/14/2020 14:14 REASON FOR VISIT  Referred by Dr.Athan Casalino Humphrey Rolls.        TESTS  Imaging: Computed Tomographic Angiography:  Cardiac multidetector CT was performed paying particular attention to the coronary arteries for the diagnosis of: CAD. Diagnostic Drugs:  Administered iohexol (Omnipaque) through  an antecubital vein and images from the examination were analyzed for the presence and extent of coronary artery disease, using 3D image processing software. 100 mL of non-ionic contrast (Omnipaque) was used.        ASSESSMENT   The mid-LAD artery was abnormal:4  The distal circumflex artery was abnormal:2 Stent  The distal right coronary artery (RCA) was abnormal:4     TEST CONCLUSIONS  Quality of study: Fair.  1-Caclium score: 709.4  2-Right dominant system.  3-Stent in OM no significant disease. Moderate disease in mid portion of RCA and LAD.      Neoma Laming MD  Electronically signed by: Neoma Laming     Date: 05/25/2019 10:22  EKG:   Recent Labs: 02/19/2022: ALT 31; BUN 11; Creatinine, Ser 1.37; Potassium 4.2; Sodium 142; TSH 1.620    Lipid Panel    Component Value Date/Time   CHOL 113 02/19/2022 1044   TRIG 302 (H) 02/19/2022 1044   HDL 34 (L) 02/19/2022 1044   LDLCALC 34 02/19/2022 1044      Other studies Reviewed: Additional studies/ records that were reviewed today include:  Review of the above records demonstrates:       No data to display            ASSESSMENT AND PLAN:    ICD-10-CM   1. Coronary artery disease involving native coronary artery of native heart without angina pectoris  I25.10     2. Atherosclerosis of native artery of right lower extremity with intermittent claudication ([place])  I70.211     3. Dizziness  R42     4. PVD (peripheral vascular disease) (HCC)  I73.9     5. Mixed hyperlipidemia  E78.2        Problem List Items Addressed This Visit       Cardiovascular and Mediastinum   CAD (coronary artery disease) - Primary    SOB on exertion and had PCI/DES OM, do stress test      Atherosclerosis of native arteries of extremity with intermittent claudication (HCC)     Other   Dizziness   Mixed hyperlipidemia   Other Visit Diagnoses     PVD (peripheral vascular disease) ([place])              Disposition:    Return in about 4 weeks (around 04/09/2022) for after echo, stress test.    Total time spent: 30 minutes  Signed,  Neoma Laming, MD  03/12/2022 10:25 [place]

## 2022-03-18 DIAGNOSIS — J449 Chronic obstructive pulmonary disease, unspecified: Secondary | ICD-10-CM | POA: Diagnosis not present

## 2022-03-20 NOTE — Progress Notes (Unsigned)
MRN : [id number]  Jose Carroll is a 57 y.o. (1965/12/17) male who presents with chief complaint of check circulation.  History of Present Illness:   The patient returns to the office for followup and review of the noninvasive studies.   There have been no interval changes in lower extremity symptoms. No interval shortening of the patient's claudication distance or development of rest pain symptoms. No new ulcers or wounds have occurred since the last visit.  There have been no significant changes to the patient's overall health care.  The patient denies amaurosis fugax or recent TIA symptoms. There are no documented recent neurological changes noted. There is no history of DVT, PE or superficial thrombophlebitis. The patient denies recent episodes of angina or shortness of breath.   ABI Rt=*** and Lt=***  (previous ABI's Rt=*** and Lt=***) Duplex ultrasound of the ***  No outpatient medications have been marked as taking for the 03/22/22 encounter (Appointment) with Delana Meyer, Dolores Lory, MD.    Past Medical History:  Diagnosis Date   Anginal pain ([place])    Arthritis    Chronic pain    Chronic, continuous use of opioids    CKD (chronic kidney disease)    Claudication of both lower extremities (HCC)    COPD (chronic obstructive pulmonary disease) ([place])    Coronary artery disease    a.) LHC 09/10/2014: 50% mLCx, 50% mLAD, 100% p-m RCA; good collaterals from LAD; med mgmt. b.) LHC 06/26/2015: 100% mRCA, 30% dLCx, 40% dLAD; good collaterals from LAD/LCx; med mgmt. c.) LHC 10/28/2017: EF 60%; 100% mRCA, 95% m-dLCx, 30% dLCx; PCI placing a 2.5 x 15 mm Resolute Onyx DES x1 to mLCx. Chronic RCA occlusion with L-R collaterals.   Erectile dysfunction    GERD (gastroesophageal reflux disease)    HLD (hyperlipidemia)    Hypertension    Long term current use of antithrombotics/antiplatelets    a.) ASA + ticagrelor   Peripheral edema    PVD (peripheral vascular disease)  (HCC)    Shortness of breath    T2DM (type 2 diabetes mellitus) ([place])    Vitamin D deficiency     Past Surgical History:  Procedure Laterality Date   BACK SURGERY     4   CARDIAC CATHETERIZATION N/A 09/10/2014   Procedure: Left Heart Cath and Coronary Angiogram ;  Surgeon: Dionisio David, MD;  Location: [place] [facility];  Service: Cardiovascular;  Laterality: N/A;   CARDIAC CATHETERIZATION Left 06/26/2015   Procedure: Left Heart Cath and Coronary Angiography;  Surgeon: Dionisio David, MD;  Location: [place] [facility];  Service: Cardiovascular;  Laterality: Left;   CORONARY STENT INTERVENTION N/A 10/28/2017   Procedure: CORONARY STENT INTERVENTION (2.5 x 15 mm Resolute Onyx DES x 1 to mLCx);  Surgeon: Wellington Hampshire, MD;  Location: [place] [facility];  Service: Cardiovascular;  Laterality: N/A;   FETAL SURGERY FOR CONGENITAL HERNIA  2012   FINGER SURGERY     sixth finger removed   HERNIA REPAIR     LEFT HEART CATH AND CORONARY ANGIOGRAPHY Left 10/28/2017   Procedure: LEFT HEART CATH AND CORONARY ANGIOGRAPHY;  Surgeon: Dionisio David, MD;  Location: [facility];  Service: Cardiovascular;  Laterality: Left;   PULSE GENERATOR IMPLANT N/A 12/29/2020   Procedure: PULSE GENERATOR IMPLANT (MEDTRONIC);  Surgeon: Deetta Perla, MD;  Location: [facility];  Service: Neurosurgery;  Laterality: N/A;  SUBCLAVIAN ARTERY STENT     THORACIC LAMINECTOMY FOR SPINAL CORD STIMULATOR N/A 12/22/2020   Procedure: THORACIC LAMINECTOMY FOR SPINAL CORD STIMULATOR PADDLE TRIAL (MEDTRONIC);  Surgeon: Deetta Perla, MD;  Location: [facility];  Service: Neurosurgery;  Laterality: N/A;   TONSILLECTOMY      Social History Social History   Tobacco Use   Smoking status: Every Day    Packs/day: 1.00    Years: 25.00    Total pack years: 25.00    Types: Cigarettes    Last attempt to quit: 07/04/2015    Years since quitting: 6.7   Smokeless tobacco: Former    Quit date: 07/01/2015  Vaping Use    Vaping Use: Every day   Devices: zero nicotine  Substance Use Topics   Alcohol use: No    Alcohol/week: 0.0 standard drinks of alcohol   Drug use: No    Family History Family History  Problem Relation Age of Onset   Cancer Mother    Diabetes Mother    Anuerysm Father     Allergies  Allergen Reactions   Naproxen Rash     REVIEW OF SYSTEMS (Negative unless checked)  Constitutional: '[]'$ Weight loss  '[]'$ Fever  '[]'$ Chills Cardiac: '[]'$ Chest pain   '[]'$ Chest pressure   '[]'$ Palpitations   '[]'$ Shortness of breath when laying flat   '[]'$ Shortness of breath with exertion. Vascular:  '[x]'$ Pain in legs with walking   '[]'$ Pain in legs at rest  '[]'$ History of DVT   '[]'$ Phlebitis   '[]'$ Swelling in legs   '[]'$ Varicose veins   '[]'$ Non-healing ulcers Pulmonary:   '[]'$ Uses home oxygen   '[]'$ Productive cough   '[]'$ Hemoptysis   '[]'$ Wheeze  '[x]'$ COPD   '[]'$ Asthma Neurologic:  '[]'$ Dizziness   '[]'$ Seizures   '[]'$ History of stroke   '[]'$ History of TIA  '[]'$ Aphasia   '[]'$ Vissual changes   '[]'$ Weakness or numbness in arm   '[x]'$ Weakness or numbness in leg Musculoskeletal:   '[]'$ Joint swelling   '[x]'$ Joint pain   '[x]'$ Low back pain Hematologic:  '[]'$ Easy bruising  '[]'$ Easy bleeding   '[]'$ Hypercoagulable state   '[]'$ Anemic Gastrointestinal:  '[]'$ Diarrhea   '[]'$ Vomiting  '[]'$ Gastroesophageal reflux/heartburn   '[]'$ Difficulty swallowing. Genitourinary:  '[]'$ Chronic kidney disease   '[]'$ Difficult urination  '[]'$ Frequent urination   '[]'$ Blood in urine Skin:  '[]'$ Rashes   '[]'$ Ulcers  Psychological:  '[]'$ History of anxiety   '[]'$  History of major depression.  Physical Examination  There were no vitals filed for this visit. There is no height or weight on file to calculate BMI. Gen: WD/WN, NAD Head: [place]/AT, No temporalis wasting.  Ear/Nose/Throat: Hearing grossly intact, nares w/o erythema or drainage Eyes: PER, EOMI, sclera nonicteric.  Neck: Supple, no masses.  No bruit or JVD.  Pulmonary:  Good air movement, no audible wheezing, no use of accessory muscles.  Cardiac: RRR, normal S1, S2, no  Murmurs. Vascular:  mild trophic changes, no open wounds Vessel Right Left  Radial Palpable Palpable  PT Not Palpable Not Palpable  DP Not Palpable Not Palpable  Gastrointestinal: soft, non-distended. No guarding/no peritoneal signs.  Musculoskeletal: M/S 5/5 throughout.  No visible deformity.  Neurologic: CN 2-12 intact. Pain and light touch intact in extremities.  Symmetrical.  Speech is fluent. Motor exam as listed above. Psychiatric: Judgment intact, Mood & affect appropriate for pt's clinical situation. Dermatologic: No rashes or ulcers noted.  No changes consistent with cellulitis.   CBC Lab Results  Component Value Date   WBC 8.9 12/22/2020   HGB 16.6 12/22/2020   HCT 49.6 12/22/2020   MCV 88.6 12/22/2020  PLT 137 (L) 12/22/2020    BMET    Component Value Date/Time   NA 142 02/19/2022 1044   NA 136 05/05/2011 2221   K 4.2 02/19/2022 1044   K 3.8 05/05/2011 2221   CL 101 02/19/2022 1044   CL 101 05/05/2011 2221   CO2 26 02/19/2022 1044   CO2 24 05/05/2011 2221   GLUCOSE 126 (H) 02/19/2022 1044   GLUCOSE 112 (H) 12/22/2020 1311   GLUCOSE 175 (H) 05/05/2011 2221   BUN 11 02/19/2022 1044   BUN 4 (L) 05/05/2011 2221   CREATININE 1.37 (H) 02/19/2022 1044   CREATININE 1.12 05/05/2011 2221   CALCIUM 9.7 02/19/2022 1044   CALCIUM 8.9 05/05/2011 2221   GFRNONAA >60 12/22/2020 1311   GFRNONAA >60 05/05/2011 2221   GFRAA >60 10/29/2017 0333   GFRAA >60 05/05/2011 2221   CrCl cannot be calculated (Patient's most recent lab result is older than the maximum 21 days allowed.).  COAG Lab Results  Component Value Date   INR 1.0 12/22/2020   INR 0.89 02/07/2013   INR 0.9 05/05/2011    Radiology No results found.   Assessment/Plan There are no diagnoses linked to this encounter.   Hortencia Pilar, MD  03/20/2022 2:22 PM

## 2022-03-22 ENCOUNTER — Ambulatory Visit (INDEPENDENT_AMBULATORY_CARE_PROVIDER_SITE_OTHER): Payer: Medicare HMO

## 2022-03-22 ENCOUNTER — Encounter (INDEPENDENT_AMBULATORY_CARE_PROVIDER_SITE_OTHER): Payer: Self-pay | Admitting: Vascular Surgery

## 2022-03-22 ENCOUNTER — Ambulatory Visit (INDEPENDENT_AMBULATORY_CARE_PROVIDER_SITE_OTHER): Payer: Medicare HMO | Admitting: Vascular Surgery

## 2022-03-22 VITALS — BP 137/70 | HR 68 | Resp 18 | Ht 70.0 in | Wt 198.0 lb

## 2022-03-22 DIAGNOSIS — M5136 Other intervertebral disc degeneration, lumbar region: Secondary | ICD-10-CM | POA: Diagnosis not present

## 2022-03-22 DIAGNOSIS — J449 Chronic obstructive pulmonary disease, unspecified: Secondary | ICD-10-CM | POA: Diagnosis not present

## 2022-03-22 DIAGNOSIS — I70213 Atherosclerosis of native arteries of extremities with intermittent claudication, bilateral legs: Secondary | ICD-10-CM | POA: Diagnosis not present

## 2022-03-22 DIAGNOSIS — E782 Mixed hyperlipidemia: Secondary | ICD-10-CM | POA: Diagnosis not present

## 2022-03-22 DIAGNOSIS — I2510 Atherosclerotic heart disease of native coronary artery without angina pectoris: Secondary | ICD-10-CM | POA: Diagnosis not present

## 2022-03-22 DIAGNOSIS — M51369 Other intervertebral disc degeneration, lumbar region without mention of lumbar back pain or lower extremity pain: Secondary | ICD-10-CM

## 2022-03-26 ENCOUNTER — Other Ambulatory Visit: Payer: Self-pay | Admitting: Nurse Practitioner

## 2022-03-29 DIAGNOSIS — E119 Type 2 diabetes mellitus without complications: Secondary | ICD-10-CM | POA: Diagnosis not present

## 2022-03-31 ENCOUNTER — Other Ambulatory Visit: Payer: Medicare HMO

## 2022-04-06 ENCOUNTER — Ambulatory Visit: Payer: Medicare HMO | Admitting: Cardiovascular Disease

## 2022-04-15 ENCOUNTER — Other Ambulatory Visit: Payer: Self-pay | Admitting: Nurse Practitioner

## 2022-04-15 DIAGNOSIS — R197 Diarrhea, unspecified: Secondary | ICD-10-CM

## 2022-04-26 DIAGNOSIS — Z79891 Long term (current) use of opiate analgesic: Secondary | ICD-10-CM | POA: Diagnosis not present

## 2022-05-11 ENCOUNTER — Other Ambulatory Visit: Payer: Self-pay | Admitting: Nurse Practitioner

## 2022-05-12 DIAGNOSIS — M545 Low back pain, unspecified: Secondary | ICD-10-CM | POA: Diagnosis not present

## 2022-05-12 DIAGNOSIS — M79662 Pain in left lower leg: Secondary | ICD-10-CM | POA: Diagnosis not present

## 2022-05-12 DIAGNOSIS — M79604 Pain in right leg: Secondary | ICD-10-CM | POA: Diagnosis not present

## 2022-05-12 DIAGNOSIS — Z79891 Long term (current) use of opiate analgesic: Secondary | ICD-10-CM | POA: Diagnosis not present

## 2022-05-18 DIAGNOSIS — Z0100 Encounter for examination of eyes and vision without abnormal findings: Secondary | ICD-10-CM | POA: Diagnosis not present

## 2022-05-27 DIAGNOSIS — E119 Type 2 diabetes mellitus without complications: Secondary | ICD-10-CM | POA: Diagnosis not present

## 2022-05-28 ENCOUNTER — Other Ambulatory Visit: Payer: Self-pay | Admitting: Cardiovascular Disease

## 2022-05-28 DIAGNOSIS — I2510 Atherosclerotic heart disease of native coronary artery without angina pectoris: Secondary | ICD-10-CM

## 2022-05-30 NOTE — Progress Notes (Unsigned)
Cardiology Office Note  Date:  05/31/2022   ID:  JB KODA, DOB 07/01/1965, MRN [id number]  PCP:  Orson Eva, NP   Chief Complaint  Patient presents with   New Patient (Initial Visit)    Ref by Dr. Gilda Crease to establish care for CAD. Medications reviewed by the patient verbally.     HPI:  Jose Carroll is a 57 year old gentleman with past medical history of Coronary artery disease  stenting to the OM October 2019, chronically occluded mid RCA with left-to-right collaterals PAD DJD COPD, inhaler and nebs Hyperlipidemia diabetes Referred by Dr. Gilda Crease for coronary artery disease Previously seen by Dr. Providence Lanius discussion concerning prior cardiac history Reports having cardiac catheterization October 2019, stent placed to OM vessel Has chronically occluded mid RCA, with collaterals, asymptomatic  Outside echocardiogram November 2022 Normal EF  March 2024 lower extremity ABIs within normal range bilaterally  Smokes 3/4 ppd Trying to quit  Currently reports no angina Chronic back pain, has back stimulator 4 prior back surgeries  Blood pressure low today, reports having occasional dizziness  Lab work reviewed Total cholesterol 113 LDL 34 A1c 6.8 down from 7.8 Creatinine 1.37  EKG personally reviewed by myself on todays visit  NSR rate 83 no significant ST or t wave changes  PMH:   has a past medical history of Anginal pain (HCC), Arthritis, Chronic pain, Chronic, continuous use of opioids, CKD (chronic kidney disease), Claudication of both lower extremities (HCC), COPD (chronic obstructive pulmonary disease) (HCC), Coronary artery disease, Erectile dysfunction, GERD (gastroesophageal reflux disease), HLD (hyperlipidemia), Hypertension, Long term current use of antithrombotics/antiplatelets, Peripheral edema, PVD (peripheral vascular disease) (HCC), Shortness of breath, T2DM (type 2 diabetes mellitus) (HCC), and Vitamin D deficiency.  PSH:    Past  Surgical History:  Procedure Laterality Date   BACK SURGERY     4   CARDIAC CATHETERIZATION N/A 09/10/2014   Procedure: Left Heart Cath and Coronary Angiogram ;  Surgeon: Laurier Nancy, MD;  Location: [facility];  Service: Cardiovascular;  Laterality: N/A;   CARDIAC CATHETERIZATION Left 06/26/2015   Procedure: Left Heart Cath and Coronary Angiography;  Surgeon: Laurier Nancy, MD;  Location: [facility];  Service: Cardiovascular;  Laterality: Left;   CORONARY STENT INTERVENTION N/A 10/28/2017   Procedure: CORONARY STENT INTERVENTION (2.5 x 15 mm Resolute Onyx DES x 1 to mLCx);  Surgeon: Iran Ouch, MD;  Location: [facility];  Service: Cardiovascular;  Laterality: N/A;   FETAL SURGERY FOR CONGENITAL HERNIA  2012   FINGER SURGERY     sixth finger removed   HERNIA REPAIR     LEFT HEART CATH AND CORONARY ANGIOGRAPHY Left 10/28/2017   Procedure: LEFT HEART CATH AND CORONARY ANGIOGRAPHY;  Surgeon: Laurier Nancy, MD;  Location: [facility];  Service: Cardiovascular;  Laterality: Left;   PULSE GENERATOR IMPLANT N/A 12/29/2020   Procedure: PULSE GENERATOR IMPLANT (MEDTRONIC);  Surgeon: Lucy Chris, MD;  Location: [facility];  Service: Neurosurgery;  Laterality: N/A;   SUBCLAVIAN ARTERY STENT     THORACIC LAMINECTOMY FOR SPINAL CORD STIMULATOR N/A 12/22/2020   Procedure: THORACIC LAMINECTOMY FOR SPINAL CORD STIMULATOR PADDLE TRIAL (MEDTRONIC);  Surgeon: Lucy Chris, MD;  Location: [facility];  Service: Neurosurgery;  Laterality: N/A;   TONSILLECTOMY      Current Outpatient Medications  Medication Sig Dispense Refill   albuterol (VENTOLIN HFA) 108 (90 Base) MCG/ACT inhaler INHALE 1 PUFF BY MOUTH EVER 6 HOURS AS NEEDED  FOR SHORTNESS OF BREATH AND/OR WHEEZING 18 each 5   atorvastatin (LIPITOR) 80 MG tablet TAKE 1 TABLET BY MOUTH EVERY DAY 90 tablet 1   budesonide (PULMICORT) 0.25 MG/2ML nebulizer solution Take 0.25 mg by nebulization 2 (two) times daily.      budesonide (PULMICORT) 0.5 MG/2ML nebulizer solution USE 1 VIAL  IN  NEBULIZER TWICE  DAILY - rinse mouth after each treatment 2 mL 11   cetirizine (ZYRTEC) 10 MG tablet Take 10 mg by mouth at bedtime.     diphenoxylate-atropine (LOMOTIL) 2.5-0.025 MG tablet TAKE 1 TABLET BY MOUTH 4 (FOUR) TIMES DAILY AS NEEDED FOR DIARRHEA OR LOOSE STOOLS. 30 tablet 1   Dulaglutide (TRULICITY) 0.75 MG/0.5ML SOPN INJECT 0.5 ML SUBCUTANEOUSLY IN ABDOMINAL REGION WEEKLY, ROTATING SITES E11.65 2 mL 11   empagliflozin (JARDIANCE) 25 MG TABS tablet Take 25 mg by mouth in the morning.     fluticasone (FLONASE) 50 MCG/ACT nasal spray Place 1 spray into both nostrils daily.     formoterol (PERFOROMIST) 20 MCG/2ML nebulizer solution USE 1 VIAL  IN  NEBULIZER TWICE  DAILY - morning and evening 2 mL 11   furosemide (LASIX) 40 MG tablet Take 40 mg by mouth daily as needed for edema.     gabapentin (NEURONTIN) 600 MG tablet Take 1,200 mg by mouth 3 (three) times daily.      glipiZIDE (GLUCOTROL XL) 5 MG 24 hr tablet TAKE 1 TABLET BY MOUTH TWICE A DAY 180 tablet 3   icosapent Ethyl (VASCEPA) 1 g capsule Take 1 g by mouth 2 (two) times daily.     INCRUSE ELLIPTA 62.5 MCG/ACT AEPB Inhale 1 puff into the lungs daily.     insulin degludec (TRESIBA) 100 UNIT/ML FlexTouch Pen Inject 45 Units into the skin at bedtime.     isosorbide mononitrate (IMDUR) 120 MG 24 hr tablet TAKE 1 TABLET BY MOUTH TWICE A DAY 180 tablet 1   LINZESS 290 MCG CAPS capsule Take 290 mcg by mouth daily.     losartan (COZAAR) 25 MG tablet TAKE 1 TABLET BY MOUTH DAILY FOR KIDNEY FUNCTION 90 tablet 3   metoprolol succinate (TOPROL-XL) 100 MG 24 hr tablet Take 100 mg by mouth daily. Take with or immediately following a meal.     Multiple Vitamin (MULTIVITAMIN WITH MINERALS) TABS tablet Take 1 tablet by mouth daily.     nitroGLYCERIN (NITROSTAT) 0.3 MG SL tablet PLACE 1 TABLET UNDER TONGUE AS NEEDED FOR CHEST PAIN EVERY 5 MINUTES *3 DOSES/CALL 911* 100 tablet 2    oxyCODONE ER 9 MG C12A Take 9 mg by mouth every 12 (twelve) hours.      pantoprazole (PROTONIX) 40 MG tablet TAKE 1 TABLET BY MOUTH DAILY IN AM 1 HOUR PRIOR TO BREAKFAST 90 tablet 3   polyethylene glycol powder (GLYCOLAX/MIRALAX) 17 GM/SCOOP powder Take 17 g by mouth daily.     pramipexole (MIRAPEX) 0.5 MG tablet Take 0.5 mg by mouth at bedtime.     ranolazine (RANEXA) 1000 MG SR tablet Take 1,000 mg by mouth 2 (two) times daily.     tamsulosin (FLOMAX) 0.4 MG CAPS capsule Take 0.4 mg by mouth daily.     ticagrelor (BRILINTA) 60 MG TABS tablet Take 60 mg by mouth 2 (two) times daily.     tiZANidine (ZANAFLEX) 4 MG tablet Take 4 mg by mouth in the morning, at noon, in the evening, and at bedtime.     YUPELRI 175 MCG/3ML nebulizer solution Take 175 mcg by nebulization  daily.     No current facility-administered medications for this visit.     Allergies:   Naproxen   Social History:  The patient  reports that he has been smoking cigarettes. He has a 25.00 pack-year smoking history. He quit smokeless tobacco use about 6 years ago. He reports that he does not drink alcohol and does not use drugs.   Family History:   family history includes Anuerysm in his father; Cancer in his mother; Diabetes in his mother.    Review of Systems: Review of Systems  Constitutional: Negative.   HENT: Negative.    Respiratory:  Positive for cough and shortness of breath.   Cardiovascular: Negative.   Gastrointestinal: Negative.   Musculoskeletal: Negative.   Neurological: Negative.   Psychiatric/Behavioral: Negative.    All other systems reviewed and are negative.   PHYSICAL EXAM: VS:  BP (!) 82/58 (BP Location: Left Arm, Patient Position: Sitting, Cuff Size: Normal)   Pulse 83   Ht 5\' 10"  (1.778 m)   Wt 194 lb 6 oz (88.2 kg)   SpO2 97%   BMI 27.89 kg/m  , BMI Body mass index is 27.89 kg/m. GEN: Well nourished, well developed, in no acute distress HEENT: normal Neck: no JVD, carotid bruits, or  masses Cardiac: RRR; no murmurs, rubs, or gallops,no edema  Respiratory:  clear to auscultation bilaterally, normal work of breathing GI: soft, nontender, nondistended, + BS MS: no deformity or atrophy Skin: warm and dry, no rash Neuro:  Strength and sensation are intact Psych: euthymic mood, full affect  Recent Labs: 02/19/2022: ALT 31; BUN 11; Creatinine, Ser 1.37; Potassium 4.2; Sodium 142; TSH 1.620    Lipid Panel Lab Results  Component Value Date   CHOL 113 02/19/2022   HDL 34 (L) 02/19/2022   LDLCALC 34 02/19/2022   TRIG 302 (H) 02/19/2022      Wt Readings from Last 3 Encounters:  05/31/22 194 lb 6 oz (88.2 kg)  03/22/22 198 lb (89.8 kg)  03/12/22 198 lb 12.8 oz (90.2 kg)       ASSESSMENT AND PLAN:  Problem List Items Addressed This Visit       Cardiology Problems   CAD (coronary artery disease) - Primary   Mixed hyperlipidemia     Other   COPD (chronic obstructive pulmonary disease) (HCC)   Relevant Medications   INCRUSE ELLIPTA 62.5 MCG/ACT AEPB   YUPELRI 175 MCG/3ML nebulizer solution   fluticasone (FLONASE) 50 MCG/ACT nasal spray   Spinal stenosis of lumbar region   Chest pain   Essential hypertension Right low blood pressure on today's visit 80 systolic, 85 systolic on my repeat Reports having some mild orthostasis symptoms Recommend he decrease isosorbide mononitrate down to 60 twice daily from 120 twice daily Closely monitor blood pressure and if it continues to run low we will decrease down to 30 twice daily Continue current dose losartan 25 daily, metoprolol succinate 100 daily  Paroxysmal tachycardia Reports no recent symptoms, Has had symptoms in the past now well-controlled on metoprolol succinate 100 daily  Coronary disease with stable angina Smoking cessation recommended Cholesterol at goal Denies anginal symptoms  COPD Smoking cessation recommended, currently smoking three quarters of a pack a day As nebulizers,  inhalers  Disposition:   F/U  12 months   Total encounter time more than 60 minutes  Greater than 50% was spent in counseling and coordination of care with the patient    Signed, Dossie Arbour, M.D., Ph.D. [facility]  [place], [place] [phone]

## 2022-05-31 ENCOUNTER — Encounter: Payer: Self-pay | Admitting: Cardiovascular Disease

## 2022-05-31 ENCOUNTER — Ambulatory Visit: Payer: Medicare HMO | Attending: Cardiovascular Disease | Admitting: Cardiovascular Disease

## 2022-05-31 VITALS — BP 82/58 | HR 83 | Ht 70.0 in | Wt 194.4 lb

## 2022-05-31 DIAGNOSIS — J449 Chronic obstructive pulmonary disease, unspecified: Secondary | ICD-10-CM

## 2022-05-31 DIAGNOSIS — I2510 Atherosclerotic heart disease of native coronary artery without angina pectoris: Secondary | ICD-10-CM | POA: Diagnosis not present

## 2022-05-31 DIAGNOSIS — M48061 Spinal stenosis, lumbar region without neurogenic claudication: Secondary | ICD-10-CM | POA: Diagnosis not present

## 2022-05-31 DIAGNOSIS — E782 Mixed hyperlipidemia: Secondary | ICD-10-CM

## 2022-05-31 DIAGNOSIS — R079 Chest pain, unspecified: Secondary | ICD-10-CM | POA: Diagnosis not present

## 2022-05-31 DIAGNOSIS — I25118 Atherosclerotic heart disease of native coronary artery with other forms of angina pectoris: Secondary | ICD-10-CM

## 2022-05-31 MED ORDER — ATORVASTATIN CALCIUM 80 MG PO TABS: 80.0000 mg | ORAL_TABLET | Freq: Every day | ORAL | 1 refills | Status: DC

## 2022-05-31 MED ORDER — FUROSEMIDE 40 MG PO TABS: 40.0000 mg | ORAL_TABLET | Freq: Every day | ORAL | 1 refills | Status: DC | PRN

## 2022-05-31 MED ORDER — METOPROLOL SUCCINATE ER 100 MG PO TB24: 100.0000 mg | ORAL_TABLET | Freq: Every day | ORAL | 1 refills | Status: DC

## 2022-05-31 MED ORDER — LOSARTAN POTASSIUM 25 MG PO TABS: 25.0000 mg | ORAL_TABLET | Freq: Every day | ORAL | 1 refills | Status: DC

## 2022-05-31 MED ORDER — ISOSORBIDE MONONITRATE ER 60 MG PO TB24: 60.0000 mg | ORAL_TABLET | Freq: Two times a day (BID) | ORAL | 1 refills | Status: DC

## 2022-05-31 MED ORDER — RANOLAZINE ER 1000 MG PO TB12: 1000.0000 mg | ORAL_TABLET | Freq: Two times a day (BID) | ORAL | 1 refills | Status: DC

## 2022-05-31 MED ORDER — TICAGRELOR 60 MG PO TABS: 60.0000 mg | ORAL_TABLET | Freq: Two times a day (BID) | ORAL | 1 refills | Status: DC

## 2022-05-31 NOTE — Patient Instructions (Addendum)
Medication Instructions:  Please decrease the isosorbide down to 60 mg twice a day  Monitor blood pressure, Call if low  (top number <[phone])  If you need a refill on your cardiac medications before your next appointment, please call your pharmacy.   Lab work: No new labs needed  Testing/Procedures: No new testing needed  Follow-Up: At [facility], you and your health needs are our priority.  As part of our continuing mission to provide you with exceptional heart care, we have created designated Provider Care Teams.  These Care Teams include your primary Cardiologist (physician) and Advanced Practice Providers (APPs -  Physician Assistants and Nurse Practitioners) who all work together to provide you with the care you need, when you need it.  You will need a follow up appointment in 6 months  Providers on your designated Care Team:   Nicolasa Ducking, NP Eula Listen, PA-C Cadence Fransico Michael, [place]  COVID-19 Vaccine Information can be found at: [email or web address] For questions related to vaccine distribution or appointments, please email [email or web address] or call [phone].

## 2022-06-03 ENCOUNTER — Other Ambulatory Visit: Payer: Self-pay | Admitting: Cardiovascular Disease

## 2022-06-03 DIAGNOSIS — R079 Chest pain, unspecified: Secondary | ICD-10-CM

## 2022-06-16 DIAGNOSIS — M79661 Pain in right lower leg: Secondary | ICD-10-CM | POA: Diagnosis not present

## 2022-06-16 DIAGNOSIS — Z79891 Long term (current) use of opiate analgesic: Secondary | ICD-10-CM | POA: Diagnosis not present

## 2022-06-16 DIAGNOSIS — M79662 Pain in left lower leg: Secondary | ICD-10-CM | POA: Diagnosis not present

## 2022-06-16 DIAGNOSIS — M545 Low back pain, unspecified: Secondary | ICD-10-CM | POA: Diagnosis not present

## 2022-06-21 ENCOUNTER — Other Ambulatory Visit: Payer: Self-pay | Admitting: Nurse Practitioner

## 2022-06-21 ENCOUNTER — Other Ambulatory Visit: Payer: Self-pay | Admitting: Cardiovascular Disease

## 2022-06-21 DIAGNOSIS — R197 Diarrhea, unspecified: Secondary | ICD-10-CM

## 2022-06-23 ENCOUNTER — Other Ambulatory Visit: Payer: Medicare HMO

## 2022-06-23 DIAGNOSIS — E1100 Type 2 diabetes mellitus with hyperosmolarity without nonketotic hyperglycemic-hyperosmolar coma (NKHHC): Secondary | ICD-10-CM | POA: Diagnosis not present

## 2022-06-23 DIAGNOSIS — E669 Obesity, unspecified: Secondary | ICD-10-CM | POA: Diagnosis not present

## 2022-06-23 DIAGNOSIS — E782 Mixed hyperlipidemia: Secondary | ICD-10-CM | POA: Diagnosis not present

## 2022-06-23 DIAGNOSIS — I10 Essential (primary) hypertension: Secondary | ICD-10-CM

## 2022-06-24 ENCOUNTER — Ambulatory Visit: Payer: Medicare HMO | Admitting: Nurse Practitioner

## 2022-06-24 LAB — CBC WITH DIFFERENTIAL
Basophils Absolute: 0.1 10*3/uL (ref 0.0–0.2)
Basos: 1 %
EOS (ABSOLUTE): 0.2 10*3/uL (ref 0.0–0.4)
Eos: 2 %
Hematocrit: 49.4 % (ref 37.5–51.0)
Hemoglobin: 16.3 g/dL (ref 13.0–17.7)
Immature Grans (Abs): 0 10*3/uL (ref 0.0–0.1)
Immature Granulocytes: 0 %
Lymphocytes Absolute: 2.2 10*3/uL (ref 0.7–3.1)
Lymphs: 26 %
MCH: 29.7 pg (ref 26.6–33.0)
MCHC: 33 g/dL (ref 31.5–35.7)
MCV: 90 fL (ref 79–97)
Monocytes Absolute: 0.6 10*3/uL (ref 0.1–0.9)
Monocytes: 7 %
Neutrophils Absolute: 5.4 10*3/uL (ref 1.4–7.0)
Neutrophils: 64 %
RBC: 5.49 x10E6/uL (ref 4.14–5.80)
RDW: 13.2 % (ref 11.6–15.4)
WBC: 8.4 10*3/uL (ref 3.4–10.8)

## 2022-06-24 LAB — CMP14+EGFR
ALT: 40 IU/L (ref 0–44)
AST: 32 IU/L (ref 0–40)
Albumin/Globulin Ratio: 1.8
Albumin: 4.4 g/dL (ref 3.8–4.9)
Alkaline Phosphatase: 158 IU/L — ABNORMAL HIGH (ref 44–121)
BUN/Creatinine Ratio: 5 — ABNORMAL LOW (ref 9–20)
BUN: 6 mg/dL (ref 6–24)
Bilirubin Total: 0.4 mg/dL (ref 0.0–1.2)
CO2: 25 mmol/L (ref 20–29)
Calcium: 9.4 mg/dL (ref 8.7–10.2)
Chloride: 101 mmol/L (ref 96–106)
Creatinine, Ser: 1.12 mg/dL (ref 0.76–1.27)
Globulin, Total: 2.4 g/dL (ref 1.5–4.5)
Glucose: 213 mg/dL — ABNORMAL HIGH (ref 70–99)
Potassium: 3.3 mmol/L — ABNORMAL LOW (ref 3.5–5.2)
Sodium: 139 mmol/L (ref 134–144)
Total Protein: 6.8 g/dL (ref 6.0–8.5)
eGFR: 78 mL/min/{1.73_m2} (ref >59)

## 2022-06-24 LAB — TSH: TSH: 3.04 u[IU]/mL (ref 0.450–4.500)

## 2022-06-24 LAB — LIPID PANEL
Chol/HDL Ratio: 4 ratio (ref 0.0–5.0)
Cholesterol, Total: 123 mg/dL (ref 100–199)
HDL: 31 mg/dL — ABNORMAL LOW (ref >39)
LDL Chol Calc (NIH): 29 mg/dL (ref 0–99)
Triglycerides: 451 mg/dL — ABNORMAL HIGH (ref 0–149)
VLDL Cholesterol Cal: 63 mg/dL — ABNORMAL HIGH (ref 5–40)

## 2022-06-24 LAB — HEMOGLOBIN A1C
Est. average glucose Bld gHb Est-mCnc: 209 mg/dL
Hgb A1c MFr Bld: 8.9 % — ABNORMAL HIGH (ref 4.8–5.6)

## 2022-06-30 ENCOUNTER — Other Ambulatory Visit: Payer: Self-pay | Admitting: Nurse Practitioner

## 2022-07-02 ENCOUNTER — Ambulatory Visit: Payer: Medicare HMO | Admitting: Internal Medicine

## 2022-07-07 DIAGNOSIS — J449 Chronic obstructive pulmonary disease, unspecified: Secondary | ICD-10-CM | POA: Diagnosis not present

## 2022-07-07 DIAGNOSIS — E119 Type 2 diabetes mellitus without complications: Secondary | ICD-10-CM | POA: Diagnosis not present

## 2022-07-13 ENCOUNTER — Other Ambulatory Visit: Payer: Self-pay

## 2022-07-13 MED ORDER — PRAMIPEXOLE DIHYDROCHLORIDE 0.5 MG PO TABS: 0.5000 mg | ORAL_TABLET | Freq: Every day | ORAL | 0 refills | Status: DC

## 2022-07-23 ENCOUNTER — Ambulatory Visit (INDEPENDENT_AMBULATORY_CARE_PROVIDER_SITE_OTHER): Payer: Medicare HMO | Admitting: Family

## 2022-07-23 ENCOUNTER — Telehealth: Payer: Self-pay | Admitting: Cardiovascular Disease

## 2022-07-23 VITALS — BP 100/59 | HR 92 | Ht 70.0 in | Wt 192.8 lb

## 2022-07-23 DIAGNOSIS — I959 Hypotension, unspecified: Secondary | ICD-10-CM

## 2022-07-23 DIAGNOSIS — E1100 Type 2 diabetes mellitus with hyperosmolarity without nonketotic hyperglycemic-hyperosmolar coma (NKHHC): Secondary | ICD-10-CM

## 2022-07-23 DIAGNOSIS — E782 Mixed hyperlipidemia: Secondary | ICD-10-CM | POA: Diagnosis not present

## 2022-07-23 MED ORDER — LOSARTAN POTASSIUM 25 MG PO TABS: 12.5000 mg | ORAL_TABLET | Freq: Every day | ORAL | 1 refills | Status: DC

## 2022-07-23 MED ORDER — INSULIN DEGLUDEC 100 UNIT/ML ~~LOC~~ SOPN
48.0000 [IU] | PEN_INJECTOR | Freq: Every day | SUBCUTANEOUS | 3 refills | Status: DC
Start: 2022-07-23 — End: 2022-12-07

## 2022-07-23 NOTE — Telephone Encounter (Signed)
Patient called in stating that his blood pressure this AM was really low, he advised that last night he began having chest pain - he advised with me that he took 4 of his nitroglycerins and it improved his symptoms. He states that this morning his blood pressure was 74/60, HR 91. He did have some lightheadedness. Patient instructed on the correct way to take nitroglycerin, and after 3 doses he should have called 911- patient did verbalize understanding of this. Instructions also given that this could lower blood pressure readings. Patient was unaware of this. I had patient retake his blood pressure while on the phone, on recheck at 11:40 AM it was 94/66. Patient advised this should continue to improve with the day, advised to move positions slowly, continue to monitor BP, if chest pains come back he should call 911 and go to the ED to be evaluated. Patient verbalized understanding.

## 2022-07-23 NOTE — Telephone Encounter (Signed)
Pt c/o BP issue: STAT if pt c/o blurred vision, one-sided weakness or slurred speech  1. What are your last 5 BP readings?   74/60   HR 91 (this morning) 111/80  HR 95 101/72  HR 83 116/93  HR 85 115/76  HR 77  2. Are you having any other symptoms (ex. Dizziness, headache, blurred vision, passed out)?   Dizzy on standing, cold sweat, shivers  3. What is your BP issue?   Patient stated he is concern his BP dropped below 100.  Patient stated he has had these symptoms since Wednesday morning.  Patient stated he will be seeing his regular doctor today.  Patient stated her took 4 Nitrostat within 1 hour last night.

## 2022-07-24 ENCOUNTER — Encounter: Payer: Self-pay | Admitting: Family

## 2022-07-24 DIAGNOSIS — E1100 Type 2 diabetes mellitus with hyperosmolarity without nonketotic hyperglycemic-hyperosmolar coma (NKHHC): Secondary | ICD-10-CM | POA: Insufficient documentation

## 2022-07-24 NOTE — Assessment & Plan Note (Signed)
Based on results, pt will increase Tresiba dosing  Will recheck labs at follow up appointment.

## 2022-07-24 NOTE — Assessment & Plan Note (Signed)
Continue current therapy for lipid control. Will modify as needed based on labwork results.   

## 2022-07-24 NOTE — Progress Notes (Signed)
Established Patient Office Visit  Subjective:  Patient ID: Jose Carroll, male    DOB: 10/08/1965  Age: 57 y.o. MRN: [id number]  Chief Complaint  Patient presents with   Follow-up    4 mo lab f/u    Patient is here today for his 4 months follow up.  He has been feeling well since last appointment.   He does have additional concerns to discuss today.  Patient has been having dizzy spells and low blood pressure, says that his cardiologist has been changing his meds.   Labs were done prior to appointment, and patient would like to discuss these today.  He needs refills.   I have reviewed his active problem list, medication list, allergies, notes from last encounter, lab results for his appointment today.      No other concerns at this time.   Past Medical History:  Diagnosis Date   Anginal pain (HCC)    Arthritis    Chronic pain    Chronic, continuous use of opioids    CKD (chronic kidney disease)    Claudication of both lower extremities (HCC)    COPD (chronic obstructive pulmonary disease) (HCC)    Coronary artery disease    a.) LHC 09/10/2014: 50% mLCx, 50% mLAD, 100% p-m RCA; good collaterals from LAD; med mgmt. b.) LHC 06/26/2015: 100% mRCA, 30% dLCx, 40% dLAD; good collaterals from LAD/LCx; med mgmt. c.) LHC 10/28/2017: EF 60%; 100% mRCA, 95% m-dLCx, 30% dLCx; PCI placing a 2.5 x 15 mm Resolute Onyx DES x1 to mLCx. Chronic RCA occlusion with L-R collaterals.   Erectile dysfunction    GERD (gastroesophageal reflux disease)    HLD (hyperlipidemia)    Hypertension    Long term current use of antithrombotics/antiplatelets    a.) ASA + ticagrelor   Peripheral edema    PVD (peripheral vascular disease) (HCC)    Shortness of breath    T2DM (type 2 diabetes mellitus) (HCC)    Vitamin D deficiency     Past Surgical History:  Procedure Laterality Date   BACK SURGERY     4   CARDIAC CATHETERIZATION N/A 09/10/2014   Procedure: Left Heart Cath and Coronary  Angiogram ;  Surgeon: Laurier Nancy, MD;  Location: [facility];  Service: Cardiovascular;  Laterality: N/A;   CARDIAC CATHETERIZATION Left 06/26/2015   Procedure: Left Heart Cath and Coronary Angiography;  Surgeon: Laurier Nancy, MD;  Location: [facility];  Service: Cardiovascular;  Laterality: Left;   CORONARY STENT INTERVENTION N/A 10/28/2017   Procedure: CORONARY STENT INTERVENTION (2.5 x 15 mm Resolute Onyx DES x 1 to mLCx);  Surgeon: Iran Ouch, MD;  Location: [facility];  Service: Cardiovascular;  Laterality: N/A;   FETAL SURGERY FOR CONGENITAL HERNIA  2012   FINGER SURGERY     sixth finger removed   HERNIA REPAIR     LEFT HEART CATH AND CORONARY ANGIOGRAPHY Left 10/28/2017   Procedure: LEFT HEART CATH AND CORONARY ANGIOGRAPHY;  Surgeon: Laurier Nancy, MD;  Location: [facility];  Service: Cardiovascular;  Laterality: Left;   PULSE GENERATOR IMPLANT N/A 12/29/2020   Procedure: PULSE GENERATOR IMPLANT (MEDTRONIC);  Surgeon: Lucy Chris, MD;  Location: [facility];  Service: Neurosurgery;  Laterality: N/A;   SUBCLAVIAN ARTERY STENT     THORACIC LAMINECTOMY FOR SPINAL CORD STIMULATOR N/A 12/22/2020   Procedure: THORACIC LAMINECTOMY FOR SPINAL CORD STIMULATOR PADDLE TRIAL (MEDTRONIC);  Surgeon: Lucy Chris, MD;  [facility];  Service: Neurosurgery;  Laterality: N/A;   TONSILLECTOMY      Social History   Socioeconomic History   Marital status: Married    Spouse name: Not on file   Number of children: Not on file   Years of education: Not on file   Highest education level: Not on file  Occupational History   Not on file  Tobacco Use   Smoking status: Every Day    Current packs/day: 0.00    Average packs/day: 1 pack/day for 25.0 years (25.0 ttl pk-yrs)    Types: Cigarettes    Start date: 07/04/1990    Last attempt to quit: 07/04/2015    Years since quitting: 7.0   Smokeless tobacco: Former    Quit date: 07/01/2015  Vaping Use    Vaping status: Every Day   Substances: Nicotine   Devices: zero nicotine  Substance and Sexual Activity   Alcohol use: No    Alcohol/week: 0.0 standard drinks of alcohol   Drug use: No   Sexual activity: Not on file  Other Topics Concern   Not on file  Social History Narrative   Not on file   Social Determinants of Health   Financial Resource Strain: Not on file  Food Insecurity: Not on file  Transportation Needs: Not on file  Physical Activity: Not on file  Stress: Not on file  Social Connections: Not on file  Intimate Partner Violence: Not on file    Family History  Problem Relation Age of Onset   Cancer Mother    Diabetes Mother    Anuerysm Father     Allergies  Allergen Reactions   Naproxen Rash    Review of Systems  Neurological:  Positive for dizziness.  All other systems reviewed and are negative.      Objective:   BP (!) 100/59   Pulse 92   Ht 5\' 10"  (1.778 m)   Wt 192 lb 12.8 oz (87.5 kg)   SpO2 99%   BMI 27.66 kg/m   Vitals:   07/23/22 1519  BP: (!) 100/59  Pulse: 92  Height: 5\' 10"  (1.778 m)  Weight: 192 lb 12.8 oz (87.5 kg)  SpO2: 99%  BMI (Calculated): 27.66    Physical Exam Vitals and nursing note reviewed.  Constitutional:      Appearance: Normal appearance. He is normal weight.  Eyes:     Pupils: Pupils are equal, round, and reactive to light.  Cardiovascular:     Rate and Rhythm: Normal rate and regular rhythm.     Pulses: Normal pulses.     Heart sounds: Normal heart sounds.  Pulmonary:     Effort: Pulmonary effort is normal.     Breath sounds: Normal breath sounds.  Neurological:     General: No focal deficit present.     Mental Status: He is alert and oriented to person, place, and time. Mental status is at baseline.  Psychiatric:        Mood and Affect: Mood normal.        Behavior: Behavior normal.        Thought Content: Thought content normal.        Judgment: Judgment normal.      No results found for  any visits on 07/23/22.  Recent Results (from the past 2160 hour(s))  Hemoglobin A1c     Status: Abnormal   Collection Time: 06/23/22  1:50 PM  Result Value Ref Range   Hgb A1c MFr Bld 8.9 (H)  4.8 - 5.6 %    Comment:          Prediabetes: 5.7 - 6.4          Diabetes: >6.4          Glycemic control for adults with diabetes: <7.0    Est. average glucose Bld gHb Est-mCnc 209 mg/dL  TSH     Status: None   Collection Time: 06/23/22  1:50 PM  Result Value Ref Range   TSH 3.040 0.450 - 4.500 uIU/mL  CMP14+EGFR     Status: Abnormal   Collection Time: 06/23/22  1:50 PM  Result Value Ref Range   Glucose 213 (H) 70 - 99 mg/dL   BUN 6 6 - 24 mg/dL   Creatinine, Ser 9.60 0.76 - 1.27 mg/dL   eGFR 78 >45 WU/JWJ/1.91   BUN/Creatinine Ratio 5 (L) 9 - 20   Sodium 139 134 - 144 mmol/L   Potassium 3.3 (L) 3.5 - 5.2 mmol/L   Chloride 101 96 - 106 mmol/L   CO2 25 20 - 29 mmol/L   Calcium 9.4 8.7 - 10.2 mg/dL   Total Protein 6.8 6.0 - 8.5 g/dL   Albumin 4.4 3.8 - 4.9 g/dL   Globulin, Total 2.4 1.5 - 4.5 g/dL   Albumin/Globulin Ratio 1.8    Bilirubin Total 0.4 0.0 - 1.2 mg/dL   Alkaline Phosphatase 158 (H) 44 - 121 IU/L   AST 32 0 - 40 IU/L   ALT 40 0 - 44 IU/L  Lipid panel     Status: Abnormal   Collection Time: 06/23/22  1:50 PM  Result Value Ref Range   Cholesterol, Total 123 100 - 199 mg/dL   Triglycerides 478 (H) 0 - 149 mg/dL   HDL 31 (L) >29 mg/dL   VLDL Cholesterol Cal 63 (H) 5 - 40 mg/dL   LDL Chol Calc (NIH) 29 0 - 99 mg/dL   Chol/HDL Ratio 4.0 0.0 - 5.0 ratio    Comment:                                   T. Chol/HDL Ratio                                             Men  Women                               1/2 Avg.Risk  3.4    3.3                                   Avg.Risk  5.0    4.4                                2X Avg.Risk  9.6    7.1                                3X Avg.Risk 23.4   11.0   CBC With Differential     Status: None   Collection Time: 06/23/22  1:50 PM  Result Value Ref Range   WBC 8.4 3.4 - 10.8 x10E3/uL   RBC 5.49 4.14 - 5.80 x10E6/uL   Hemoglobin 16.3 13.0 - 17.7 g/dL   Hematocrit 40.9 81.1 - 51.0 %   MCV 90 79 - 97 fL   MCH 29.7 26.6 - 33.0 pg   MCHC 33.0 31.5 - 35.7 g/dL   RDW 91.4 78.2 - 95.6 %   Neutrophils 64 Not Estab. %   Lymphs 26 Not Estab. %   Monocytes 7 Not Estab. %   Eos 2 Not Estab. %   Basos 1 Not Estab. %   Neutrophils Absolute 5.4 1.4 - 7.0 x10E3/uL   Lymphocytes Absolute 2.2 0.7 - 3.1 x10E3/uL   Monocytes Absolute 0.6 0.1 - 0.9 x10E3/uL   EOS (ABSOLUTE) 0.2 0.0 - 0.4 x10E3/uL   Basophils Absolute 0.1 0.0 - 0.2 x10E3/uL   Immature Granulocytes 0 Not Estab. %   Immature Grans (Abs) 0.0 0.0 - 0.1 x10E3/uL    Comment: **Effective August 09, 2022, profile [id number] CBC/Differential**   (No Platelet) will be made non-orderable. [facility] Offers:   [id number] CBC With Differential/Platelet        Assessment & Plan:   Problem List Items Addressed This Visit       Active Problems   Mixed hyperlipidemia    Continue current therapy for lipid control. Will modify as needed based on labwork results.       Relevant Medications   losartan (COZAAR) 25 MG tablet   Type II diabetes mellitus with hyperosmolarity, uncontrolled (HCC) - Primary    Based on results, pt will increase Tresiba dosing  Will recheck labs at follow up appointment.       Relevant Medications   losartan (COZAAR) 25 MG tablet   insulin degludec (TRESIBA) 100 UNIT/ML FlexTouch Pen   Other Visit Diagnoses     Hypotension, unspecified hypotension type       I suggested that pt. try 1/2 of a tablet for his losartan and call the cardiologist to see what they suggest if his blood pressure does not improve.   Relevant Medications   losartan (COZAAR) 25 MG tablet       Return in about 4 months (around 11/23/2022).   Total time spent: 20 minutes  Miki Kins, FNP  07/23/2022   This document may have been prepared by [facility] Voice  Recognition software and as such may include unintentional dictation errors.

## 2022-08-03 ENCOUNTER — Other Ambulatory Visit: Payer: Self-pay

## 2022-08-03 MED ORDER — EMPAGLIFLOZIN 25 MG PO TABS: 25.0000 mg | ORAL_TABLET | Freq: Every morning | ORAL | 2 refills | Status: DC

## 2022-08-11 DIAGNOSIS — M79662 Pain in left lower leg: Secondary | ICD-10-CM | POA: Diagnosis not present

## 2022-08-11 DIAGNOSIS — M545 Low back pain, unspecified: Secondary | ICD-10-CM | POA: Diagnosis not present

## 2022-08-11 DIAGNOSIS — M79661 Pain in right lower leg: Secondary | ICD-10-CM | POA: Diagnosis not present

## 2022-08-23 DIAGNOSIS — E119 Type 2 diabetes mellitus without complications: Secondary | ICD-10-CM | POA: Diagnosis not present

## 2022-08-25 ENCOUNTER — Other Ambulatory Visit: Payer: Self-pay | Admitting: Cardiovascular Disease

## 2022-08-25 DIAGNOSIS — R079 Chest pain, unspecified: Secondary | ICD-10-CM

## 2022-09-03 ENCOUNTER — Other Ambulatory Visit: Payer: Self-pay

## 2022-09-03 DIAGNOSIS — K219 Gastro-esophageal reflux disease without esophagitis: Secondary | ICD-10-CM

## 2022-09-03 MED ORDER — PANTOPRAZOLE SODIUM 40 MG PO TBEC
40.0000 mg | DELAYED_RELEASE_TABLET | Freq: Every day | ORAL | 3 refills | Status: DC
Start: 2022-09-03 — End: 2023-08-29

## 2022-09-06 DIAGNOSIS — M79661 Pain in right lower leg: Secondary | ICD-10-CM | POA: Diagnosis not present

## 2022-09-06 DIAGNOSIS — M545 Low back pain, unspecified: Secondary | ICD-10-CM | POA: Diagnosis not present

## 2022-09-06 DIAGNOSIS — Z79891 Long term (current) use of opiate analgesic: Secondary | ICD-10-CM | POA: Diagnosis not present

## 2022-09-06 DIAGNOSIS — M79662 Pain in left lower leg: Secondary | ICD-10-CM | POA: Diagnosis not present

## 2022-10-06 DIAGNOSIS — Z79891 Long term (current) use of opiate analgesic: Secondary | ICD-10-CM | POA: Diagnosis not present

## 2022-10-06 DIAGNOSIS — M545 Low back pain, unspecified: Secondary | ICD-10-CM | POA: Diagnosis not present

## 2022-10-06 DIAGNOSIS — M79662 Pain in left lower leg: Secondary | ICD-10-CM | POA: Diagnosis not present

## 2022-10-06 DIAGNOSIS — G894 Chronic pain syndrome: Secondary | ICD-10-CM | POA: Diagnosis not present

## 2022-10-06 DIAGNOSIS — M79661 Pain in right lower leg: Secondary | ICD-10-CM | POA: Diagnosis not present

## 2022-10-13 ENCOUNTER — Other Ambulatory Visit: Payer: Self-pay | Admitting: Family

## 2022-10-13 ENCOUNTER — Other Ambulatory Visit: Payer: Self-pay | Admitting: Cardiovascular Disease

## 2022-10-14 ENCOUNTER — Other Ambulatory Visit: Payer: Self-pay | Admitting: Cardiology

## 2022-10-15 MED ORDER — BD PEN NEEDLE SHORT U/F 31G X 8 MM MISC: 1.0000 | Freq: Every day | 12 refills | Status: DC

## 2022-10-18 ENCOUNTER — Ambulatory Visit (INDEPENDENT_AMBULATORY_CARE_PROVIDER_SITE_OTHER): Payer: Medicare HMO | Admitting: Family

## 2022-10-18 VITALS — BP 110/70 | HR 89 | Ht 70.0 in | Wt 192.0 lb

## 2022-10-18 DIAGNOSIS — J0140 Acute pansinusitis, unspecified: Secondary | ICD-10-CM

## 2022-10-18 DIAGNOSIS — E1100 Type 2 diabetes mellitus with hyperosmolarity without nonketotic hyperglycemic-hyperosmolar coma (NKHHC): Secondary | ICD-10-CM | POA: Diagnosis not present

## 2022-10-18 DIAGNOSIS — R310 Gross hematuria: Secondary | ICD-10-CM

## 2022-10-18 DIAGNOSIS — F17210 Nicotine dependence, cigarettes, uncomplicated: Secondary | ICD-10-CM | POA: Diagnosis not present

## 2022-10-18 DIAGNOSIS — N529 Male erectile dysfunction, unspecified: Secondary | ICD-10-CM

## 2022-10-18 DIAGNOSIS — Z0130 Encounter for examination of blood pressure without abnormal findings: Secondary | ICD-10-CM

## 2022-10-18 MED ORDER — PREDNISONE 10 MG PO TABS: 20.0000 mg | ORAL_TABLET | Freq: Every day | ORAL | 0 refills | Status: DC

## 2022-10-18 MED ORDER — AMOXICILLIN-POT CLAVULANATE 875-125 MG PO TABS: 1.0000 | ORAL_TABLET | Freq: Two times a day (BID) | ORAL | 0 refills | Status: DC

## 2022-10-18 NOTE — Progress Notes (Signed)
Established Patient Office Visit  Subjective:  Patient ID: Jose Carroll, male    DOB: [birth year]  Age: 57 y.o. MRN: [id number]  Chief Complaint  Patient presents with   Follow-up    Possible sinus infection    Patient is here today for his 3 months follow up.  He has been feeling fairly well since last appointment.   He does have additional concerns to discuss today.  1) He thinks he might have a sinus infection, which has been lasting for a week so far.  He usually gets one around once a year at this time.   2) He is asking for a referral to urology for ED treatment.   3) Also asks for something to help him stop smoking  Labs are due today. He needs refills.   I have reviewed his active problem list, medication list, allergies, notes from last encounter, lab results for his appointment today.      No other concerns at this time.   Past Medical History:  Diagnosis Date   Anginal pain (HCC)    Arthritis    Chronic pain    Chronic, continuous use of opioids    CKD (chronic kidney disease)    Claudication of both lower extremities (HCC)    COPD (chronic obstructive pulmonary disease) (HCC)    Coronary artery disease    a.) LHC 09/10/2014: 50% mLCx, 50% mLAD, 100% p-m RCA; good collaterals from LAD; med mgmt. b.) LHC 06/26/2015: 100% mRCA, 30% dLCx, 40% dLAD; good collaterals from LAD/LCx; med mgmt. c.) LHC 10/28/2017: EF 60%; 100% mRCA, 95% m-dLCx, 30% dLCx; PCI placing a 2.5 x 15 mm Resolute Onyx DES x1 to mLCx. Chronic RCA occlusion with L-R collaterals.   Erectile dysfunction    GERD (gastroesophageal reflux disease)    HLD (hyperlipidemia)    Hypertension    Long term current use of antithrombotics/antiplatelets    a.) ASA + ticagrelor   Peripheral edema    PVD (peripheral vascular disease) (HCC)    Shortness of breath    T2DM (type 2 diabetes mellitus) (HCC)    Vitamin D deficiency     Past Surgical History:  Procedure Laterality Date   BACK SURGERY      4   CARDIAC CATHETERIZATION N/A 09/10/2014   Procedure: Left Heart Cath and Coronary Angiogram ;  Surgeon: Laurier Nancy, MD;  Location: [facility];  Service: Cardiovascular;  Laterality: N/A;   CARDIAC CATHETERIZATION Left 06/26/2015   Procedure: Left Heart Cath and Coronary Angiography;  Surgeon: Laurier Nancy, MD;  Location: [facility];  Service: Cardiovascular;  Laterality: Left;   CORONARY STENT INTERVENTION N/A 10/28/2017   Procedure: CORONARY STENT INTERVENTION (2.5 x 15 mm Resolute Onyx DES x 1 to mLCx);  Surgeon: Iran Ouch, MD;  Location: [facility];  Service: Cardiovascular;  Laterality: N/A;   FETAL SURGERY FOR CONGENITAL HERNIA  2012   FINGER SURGERY     sixth finger removed   HERNIA REPAIR     LEFT HEART CATH AND CORONARY ANGIOGRAPHY Left 10/28/2017   Procedure: LEFT HEART CATH AND CORONARY ANGIOGRAPHY;  Surgeon: Laurier Nancy, MD;  Location: [facility];  Service: Cardiovascular;  Laterality: Left;   PULSE GENERATOR IMPLANT N/A 12/29/2020   Procedure: PULSE GENERATOR IMPLANT (MEDTRONIC);  Surgeon: Lucy Chris, MD;  Location: [facility];  Service: Neurosurgery;  Laterality: N/A;   SUBCLAVIAN ARTERY STENT     THORACIC LAMINECTOMY FOR  SPINAL CORD STIMULATOR N/A 12/22/2020   Procedure: THORACIC LAMINECTOMY FOR SPINAL CORD STIMULATOR PADDLE TRIAL (MEDTRONIC);  Surgeon: Lucy Chris, MD;  Location: [facility];  Service: Neurosurgery;  Laterality: N/A;   TONSILLECTOMY      Social History   Socioeconomic History   Marital status: Married    Spouse name: Not on file   Number of children: Not on file   Years of education: Not on file   Highest education level: Not on file  Occupational History   Not on file  Tobacco Use   Smoking status: Every Day    Current packs/day: 0.00    Average packs/day: 1 pack/day for 25.0 years (25.0 ttl pk-yrs)    Types: Cigarettes    Start date: 07/04/1990    Last attempt to quit: 07/04/2015     Years since quitting: 7.3   Smokeless tobacco: Former    Quit date: 07/01/2015  Vaping Use   Vaping status: Every Day   Substances: Nicotine   Devices: zero nicotine  Substance and Sexual Activity   Alcohol use: No    Alcohol/week: 0.0 standard drinks of alcohol   Drug use: No   Sexual activity: Not on file  Other Topics Concern   Not on file  Social History Narrative   Not on file   Social Determinants of Health   Financial Resource Strain: Not on file  Food Insecurity: Not on file  Transportation Needs: Not on file  Physical Activity: Not on file  Stress: Not on file  Social Connections: Not on file  Intimate Partner Violence: Not on file    Family History  Problem Relation Age of Onset   Cancer Mother    Diabetes Mother    Anuerysm Father     Allergies  Allergen Reactions   Naproxen Rash    Review of Systems  HENT:  Positive for congestion and sinus pain.   All other systems reviewed and are negative.      Objective:   BP 110/70   Pulse 89   Ht 5\' 10"  (1.778 m)   Wt 192 lb (87.1 kg)   PF 97 L/min   BMI 27.55 kg/m   Vitals:   10/18/22 1115  BP: 110/70  Pulse: 89  Height: 5\' 10"  (1.778 m)  Weight: 192 lb (87.1 kg)  PF: 97 L/min  BMI (Calculated): 27.55    Physical Exam Vitals and nursing note reviewed.  Constitutional:      Appearance: Normal appearance. He is normal weight.  HENT:     Nose: Congestion and rhinorrhea present.  Eyes:     Extraocular Movements: Extraocular movements intact.     Conjunctiva/sclera: Conjunctivae normal.     Pupils: Pupils are equal, round, and reactive to light.  Cardiovascular:     Rate and Rhythm: Normal rate and regular rhythm.     Pulses: Normal pulses.     Heart sounds: Normal heart sounds.  Pulmonary:     Effort: Pulmonary effort is normal.     Breath sounds: Normal breath sounds.  Neurological:     General: No focal deficit present.     Mental Status: He is alert and oriented to person, place,  and time. Mental status is at baseline.  Psychiatric:        Mood and Affect: Mood normal.        Behavior: Behavior normal.        Thought Content: Thought content normal.        Judgment: Judgment  normal.      No results found for any visits on 10/18/22.  No results found for this or any previous visit (from the past 2160 hour(s)).     Assessment & Plan:   Problem List Items Addressed This Visit       Active Problems   Type II diabetes mellitus with hyperosmolarity, uncontrolled (HCC) - Primary    Checking labs today. Will call pt. With results  Continue current diabetes POC, as patient has been well controlled on current regimen.  Will adjust meds if needed based on labs.       Relevant Orders   POCT CBG (Fasting - Glucose)   Other Visit Diagnoses     Acute non-recurrent pansinusitis       Sending antibiotics and prednisone for pt.  He will let us know if this doesn't improve his symptoms.   Relevant Medications   amoxicillin-clavulanate (AUGMENTIN) 875-125 MG tablet   predniSONE (DELTASONE) 10 MG tablet   Cigarette nicotine dependence without complication       Male erectile disorder       setting up referral to urology for pt.       Return in about 3 months (around 01/18/2023).   Total time spent: 20 minutes  Miki Kins, FNP  10/18/2022   This document may have been prepared by [facility] Voice Recognition software and as such may include unintentional dictation errors.

## 2022-10-22 DIAGNOSIS — J449 Chronic obstructive pulmonary disease, unspecified: Secondary | ICD-10-CM | POA: Diagnosis not present

## 2022-11-01 DIAGNOSIS — M79661 Pain in right lower leg: Secondary | ICD-10-CM | POA: Diagnosis not present

## 2022-11-01 DIAGNOSIS — F1120 Opioid dependence, uncomplicated: Secondary | ICD-10-CM | POA: Diagnosis not present

## 2022-11-01 DIAGNOSIS — M79662 Pain in left lower leg: Secondary | ICD-10-CM | POA: Diagnosis not present

## 2022-11-01 DIAGNOSIS — M545 Low back pain, unspecified: Secondary | ICD-10-CM | POA: Diagnosis not present

## 2022-11-01 DIAGNOSIS — Z79891 Long term (current) use of opiate analgesic: Secondary | ICD-10-CM | POA: Diagnosis not present

## 2022-11-08 ENCOUNTER — Ambulatory Visit: Payer: Medicare HMO | Admitting: Physician Assistant

## 2022-11-14 ENCOUNTER — Encounter: Payer: Self-pay | Admitting: Family

## 2022-11-14 NOTE — Assessment & Plan Note (Signed)
Checking labs today. Will call pt. With results  Continue current diabetes POC, as patient has been well controlled on current regimen.  Will adjust meds if needed based on labs.  

## 2022-11-19 ENCOUNTER — Other Ambulatory Visit: Payer: Self-pay | Admitting: Cardiovascular Disease

## 2022-11-25 ENCOUNTER — Other Ambulatory Visit: Payer: Self-pay | Admitting: Family

## 2022-11-25 ENCOUNTER — Other Ambulatory Visit: Payer: Medicare HMO

## 2022-11-25 ENCOUNTER — Telehealth: Payer: Self-pay

## 2022-11-25 ENCOUNTER — Telehealth: Payer: Self-pay | Admitting: Family

## 2022-11-25 DIAGNOSIS — I10 Essential (primary) hypertension: Secondary | ICD-10-CM

## 2022-11-25 DIAGNOSIS — E1100 Type 2 diabetes mellitus with hyperosmolarity without nonketotic hyperglycemic-hyperosmolar coma (NKHHC): Secondary | ICD-10-CM | POA: Diagnosis not present

## 2022-11-25 DIAGNOSIS — Z1329 Encounter for screening for other suspected endocrine disorder: Secondary | ICD-10-CM | POA: Diagnosis not present

## 2022-11-25 DIAGNOSIS — E782 Mixed hyperlipidemia: Secondary | ICD-10-CM

## 2022-11-25 MED ORDER — TAMSULOSIN HCL 0.4 MG PO CAPS: 0.4000 mg | ORAL_CAPSULE | Freq: Every day | ORAL | 2 refills | Status: DC

## 2022-11-25 NOTE — Telephone Encounter (Signed)
Needs urology referral. Was supposed to be sent at last visit.

## 2022-11-25 NOTE — Telephone Encounter (Signed)
Pt states he takes Tamsulosin HCL 4 mg every day. 90 tablets

## 2022-11-26 ENCOUNTER — Ambulatory Visit: Payer: Medicare HMO | Admitting: Family

## 2022-11-26 ENCOUNTER — Other Ambulatory Visit: Payer: Self-pay | Admitting: Family

## 2022-11-26 ENCOUNTER — Encounter: Payer: Self-pay | Admitting: Family

## 2022-11-26 ENCOUNTER — Other Ambulatory Visit: Payer: Self-pay | Admitting: Cardiovascular Disease

## 2022-11-26 VITALS — BP 120/60 | HR 78 | Ht 70.0 in | Wt 192.0 lb

## 2022-11-26 DIAGNOSIS — E1100 Type 2 diabetes mellitus with hyperosmolarity without nonketotic hyperglycemic-hyperosmolar coma (NKHHC): Secondary | ICD-10-CM

## 2022-11-26 DIAGNOSIS — I10 Essential (primary) hypertension: Secondary | ICD-10-CM

## 2022-11-26 DIAGNOSIS — J449 Chronic obstructive pulmonary disease, unspecified: Secondary | ICD-10-CM | POA: Diagnosis not present

## 2022-11-26 DIAGNOSIS — E782 Mixed hyperlipidemia: Secondary | ICD-10-CM | POA: Diagnosis not present

## 2022-11-26 LAB — LIPID PANEL
Chol/HDL Ratio: 3 ratio (ref 0.0–5.0)
Cholesterol, Total: 106 mg/dL (ref 100–199)
HDL: 35 mg/dL — ABNORMAL LOW (ref >39)
LDL Chol Calc (NIH): 37 mg/dL (ref 0–99)
Triglycerides: 215 mg/dL — ABNORMAL HIGH (ref 0–149)
VLDL Cholesterol Cal: 34 mg/dL (ref 5–40)

## 2022-11-26 LAB — CBC WITH DIFF/PLATELET
Basophils Absolute: 0 10*3/uL (ref 0.0–0.2)
Basos: 1 %
EOS (ABSOLUTE): 0.2 10*3/uL (ref 0.0–0.4)
Eos: 2 %
Hematocrit: 47.8 % (ref 37.5–51.0)
Hemoglobin: 15.6 g/dL (ref 13.0–17.7)
Immature Grans (Abs): 0 10*3/uL (ref 0.0–0.1)
Immature Granulocytes: 0 %
Lymphocytes Absolute: 1.7 10*3/uL (ref 0.7–3.1)
Lymphs: 20 %
MCH: 29.3 pg (ref 26.6–33.0)
MCHC: 32.6 g/dL (ref 31.5–35.7)
MCV: 90 fL (ref 79–97)
Monocytes Absolute: 0.6 10*3/uL (ref 0.1–0.9)
Monocytes: 6 %
Neutrophils Absolute: 6 10*3/uL (ref 1.4–7.0)
Neutrophils: 71 %
Platelets: 143 10*3/uL — ABNORMAL LOW (ref 150–450)
RBC: 5.32 x10E6/uL (ref 4.14–5.80)
RDW: 14.1 % (ref 11.6–15.4)
WBC: 8.5 10*3/uL (ref 3.4–10.8)

## 2022-11-26 LAB — CMP14+EGFR
ALT: 22 [IU]/L (ref 0–44)
AST: 19 [IU]/L (ref 0–40)
Albumin: 4.2 g/dL (ref 3.8–4.9)
Alkaline Phosphatase: 156 [IU]/L — ABNORMAL HIGH (ref 44–121)
BUN/Creatinine Ratio: 5 — ABNORMAL LOW (ref 9–20)
BUN: 7 mg/dL (ref 6–24)
Bilirubin Total: 0.3 mg/dL (ref 0.0–1.2)
CO2: 27 mmol/L (ref 20–29)
Calcium: 9.5 mg/dL (ref 8.7–10.2)
Chloride: 102 mmol/L (ref 96–106)
Creatinine, Ser: 1.29 mg/dL — ABNORMAL HIGH (ref 0.76–1.27)
Globulin, Total: 2.5 g/dL (ref 1.5–4.5)
Glucose: 137 mg/dL — ABNORMAL HIGH (ref 70–99)
Potassium: 3.4 mmol/L — ABNORMAL LOW (ref 3.5–5.2)
Sodium: 144 mmol/L (ref 134–144)
Total Protein: 6.7 g/dL (ref 6.0–8.5)
eGFR: 65 mL/min/{1.73_m2} (ref >59)

## 2022-11-26 LAB — TSH: TSH: 2.31 u[IU]/mL (ref 0.450–4.500)

## 2022-11-26 LAB — HEMOGLOBIN A1C
Est. average glucose Bld gHb Est-mCnc: 151 mg/dL
Hgb A1c MFr Bld: 6.9 % — ABNORMAL HIGH (ref 4.8–5.6)

## 2022-11-26 LAB — GLUCOSE, POCT (MANUAL RESULT ENTRY): POC Glucose: 144 mg/dL — AB (ref 70–99)

## 2022-11-26 MED ORDER — POTASSIUM CHLORIDE CRYS ER 10 MEQ PO TBCR: 10.0000 meq | EXTENDED_RELEASE_TABLET | Freq: Two times a day (BID) | ORAL | 0 refills | Status: DC

## 2022-11-29 DIAGNOSIS — F1120 Opioid dependence, uncomplicated: Secondary | ICD-10-CM | POA: Diagnosis not present

## 2022-11-29 DIAGNOSIS — M545 Low back pain, unspecified: Secondary | ICD-10-CM | POA: Diagnosis not present

## 2022-11-29 DIAGNOSIS — M79662 Pain in left lower leg: Secondary | ICD-10-CM | POA: Diagnosis not present

## 2022-11-29 DIAGNOSIS — Z79891 Long term (current) use of opiate analgesic: Secondary | ICD-10-CM | POA: Diagnosis not present

## 2022-11-29 DIAGNOSIS — M79661 Pain in right lower leg: Secondary | ICD-10-CM | POA: Diagnosis not present

## 2022-12-03 ENCOUNTER — Other Ambulatory Visit: Payer: Self-pay | Admitting: Family

## 2022-12-03 NOTE — Addendum Note (Signed)
Addended by: Grayling Congress on: 12/03/2022 03:30 PM   Modules accepted: Orders

## 2022-12-05 NOTE — Progress Notes (Unsigned)
Cardiology Office Note  Date:  12/06/2022   ID:  Jose Carroll, DOB 01/27/1965, MRN [id number]  PCP:  Miki Kins, FNP   Chief Complaint  Patient presents with   6 month follow up     Patient c/o several months ago having a spell of chest pain, the patient took 4 NTG tablets and then BP dropped to 74/60. He has no more complaints of chest pain or any decrease blood pressure since that incident.     HPI:  Mr. Jose Carroll is a 57 year old gentleman with past medical history of Coronary artery disease  stenting to the OM October 2019, chronically occluded mid RCA with left-to-right collaterals PAD DJD COPD, inhaler and nebs Hyperlipidemia diabetes Follow-up of his coronary artery disease Previously seen by Dr. Welton Flakes  Last seen by myself in clinic May 2024 In follow-up today he reports that he continues to smoke 1 ppd  Several months ago reported having episode of chest pain, relieved after taking several nitroglycerin sublingual Has not had additional episodes of chest pain since that time Has remained on isosorbide mononitrate 60 twice daily  Denies significant shortness of breath or chest pain on exertion concerning for angina  Cholesterol at goal Reports his blood pressure readings at home are better than our low readings As on last clinic visit, today blood pressure running low left arm 90 systolic by nursing, 100 systolic by myself on manual recheck  cardiac catheterization October 2019, stent placed to OM vessel Has chronically occluded mid RCA, with collaterals, asymptomatic  Outside echocardiogram November 2022 Normal EF  March 2024 lower extremity ABIs within normal range bilaterally  Chronic back pain, has back stimulator 4  back surgeries  EKG personally reviewed by myself on todays visit EKG Interpretation Date/Time:  Monday December 06 2022 15:56:58 EST Ventricular Rate:  84 PR Interval:  140 QRS Duration:  94 QT Interval:  394 QTC  Calculation: 465 R Axis:   82  Text Interpretation: Normal sinus rhythm Normal ECG When compared with ECG of 29-Oct-2017 04:37, ST no longer depressed in Inferior leads ST no longer depressed in Anterolateral leads T wave inversion no longer evident in Inferior leads T wave inversion no longer evident in Anterolateral leads Confirmed by Julien Nordmann [phone]) on 12/06/2022 4:14:05 PM    PMH:   has a past medical history of Anginal pain (HCC), Arthritis, Chronic pain, Chronic, continuous use of opioids, CKD (chronic kidney disease), Claudication of both lower extremities (HCC), COPD (chronic obstructive pulmonary disease) (HCC), Coronary artery disease, Erectile dysfunction, GERD (gastroesophageal reflux disease), HLD (hyperlipidemia), Hypertension, Long term current use of antithrombotics/antiplatelets, Peripheral edema, PVD (peripheral vascular disease) (HCC), Shortness of breath, T2DM (type 2 diabetes mellitus) (HCC), and Vitamin D deficiency.  PSH:    Past Surgical History:  Procedure Laterality Date   BACK SURGERY     4   CARDIAC CATHETERIZATION N/A 09/10/2014   Procedure: Left Heart Cath and Coronary Angiogram ;  Surgeon: Laurier Nancy, MD;  Location: [facility];  Service: Cardiovascular;  Laterality: N/A;   CARDIAC CATHETERIZATION Left 06/26/2015   Procedure: Left Heart Cath and Coronary Angiography;  Surgeon: Laurier Nancy, MD;  Location: [facility];  Service: Cardiovascular;  Laterality: Left;   CORONARY STENT INTERVENTION N/A 10/28/2017   Procedure: CORONARY STENT INTERVENTION (2.5 x 15 mm Resolute Onyx DES x 1 to mLCx);  Surgeon: Iran Ouch, MD;  Location: [facility];  Service: Cardiovascular;  Laterality: N/A;  FETAL SURGERY FOR CONGENITAL HERNIA  2012   FINGER SURGERY     sixth finger removed   HERNIA REPAIR     LEFT HEART CATH AND CORONARY ANGIOGRAPHY Left 10/28/2017   Procedure: LEFT HEART CATH AND CORONARY ANGIOGRAPHY;  Surgeon: Laurier Nancy, MD;  Location: [facility];  Service: Cardiovascular;  Laterality: Left;   PULSE GENERATOR IMPLANT N/A 12/29/2020   Procedure: PULSE GENERATOR IMPLANT (MEDTRONIC);  Surgeon: Lucy Chris, MD;  Location: [facility];  Service: Neurosurgery;  Laterality: N/A;   SUBCLAVIAN ARTERY STENT     THORACIC LAMINECTOMY FOR SPINAL CORD STIMULATOR N/A 12/22/2020   Procedure: THORACIC LAMINECTOMY FOR SPINAL CORD STIMULATOR PADDLE TRIAL (MEDTRONIC);  Surgeon: Lucy Chris, MD;  Location: [facility];  Service: Neurosurgery;  Laterality: N/A;   TONSILLECTOMY      Current Outpatient Medications  Medication Sig Dispense Refill   albuterol (VENTOLIN HFA) 108 (90 Base) MCG/ACT inhaler INHALE 1 PUFF BY MOUTH EVER 6 HOURS AS NEEDED FOR SHORTNESS OF BREATH AND/OR WHEEZING 18 each 5   atorvastatin (LIPITOR) 80 MG tablet TAKE 1 TABLET BY MOUTH EVERY DAY 90 tablet 0   budesonide (PULMICORT) 0.25 MG/2ML nebulizer solution Take 0.25 mg by nebulization 2 (two) times daily.     budesonide (PULMICORT) 0.5 MG/2ML nebulizer solution USE 1 VIAL  IN  NEBULIZER TWICE  DAILY - rinse mouth after each treatment 2 mL 11   cetirizine (ZYRTEC) 10 MG tablet Take 10 mg by mouth at bedtime.     diphenoxylate-atropine (LOMOTIL) 2.5-0.025 MG tablet TAKE 1 TABLET BY MOUTH 4 (FOUR) TIMES DAILY AS NEEDED FOR DIARRHEA OR LOOSE STOOLS. 30 tablet 1   Dulaglutide (TRULICITY) 0.75 MG/0.5ML SOPN INJECT 0.5 ML SUBCUTANEOUSLY IN ABDOMINAL REGION WEEKLY, ROTATING SITES E11.65 2 mL 11   empagliflozin (JARDIANCE) 25 MG TABS tablet Take 1 tablet (25 mg total) by mouth in the morning. 90 tablet 2   fluticasone (FLONASE) 50 MCG/ACT nasal spray Place 1 spray into both nostrils daily.     formoterol (PERFOROMIST) 20 MCG/2ML nebulizer solution USE 1 VIAL  IN  NEBULIZER TWICE  DAILY - morning and evening 2 mL 11   furosemide (LASIX) 40 MG tablet Take 1 tablet (40 mg total) by mouth daily as needed for edema. 90 tablet 1   gabapentin (NEURONTIN) 600  MG tablet TAKE 2 TABLETS BY MOUTH THREE TIMES DAILY FOR PERIPHERAL NEUROPATHY 540 tablet 1   glipiZIDE (GLUCOTROL XL) 5 MG 24 hr tablet TAKE 1 TABLET BY MOUTH TWICE A DAY 180 tablet 3   INCRUSE ELLIPTA 62.5 MCG/ACT AEPB Inhale 1 puff into the lungs daily.     insulin degludec (TRESIBA) 100 UNIT/ML FlexTouch Pen Inject 48 Units into the skin at bedtime. 15 mL 3   Insulin Pen Needle (B-D ULTRAFINE III SHORT PEN) 31G X 8 MM MISC 1 each by Does not apply route daily. 100 each 12   isosorbide mononitrate (IMDUR) 60 MG 24 hr tablet Take 1 tablet (60 mg total) by mouth 2 (two) times daily. 90 tablet 1   LINZESS 290 MCG CAPS capsule Take 290 mcg by mouth daily.     losartan (COZAAR) 25 MG tablet Take 0.5 tablets (12.5 mg total) by mouth daily. 90 tablet 1   metoprolol succinate (TOPROL-XL) 100 MG 24 hr tablet Take 1 tablet (100 mg total) by mouth daily. Take with or immediately following a meal. 90 tablet 1   Multiple Vitamin (MULTIVITAMIN WITH MINERALS) TABS tablet Take 1 tablet by mouth  daily.     nitroGLYCERIN (NITROSTAT) 0.3 MG SL tablet PLACE 1 TABLET UNDER TONGUE AS NEEDED FOR CHEST PAIN EVERY 5 MINUTES *3 DOSES/CALL 911* 100 tablet 5   oxyCODONE ER 9 MG C12A Take 9 mg by mouth every 12 (twelve) hours.      pantoprazole (PROTONIX) 40 MG tablet Take 1 tablet (40 mg total) by mouth daily. 90 tablet 3   polyethylene glycol powder (GLYCOLAX/MIRALAX) 17 GM/SCOOP powder Take 17 g by mouth daily.     potassium chloride (KLOR-CON M) 10 MEQ tablet Take 1 tablet (10 mEq total) by mouth 2 (two) times daily. 7 tablet 0   pramipexole (MIRAPEX) 0.5 MG tablet TAKE 1 TABLET BY MOUTH AT BEDTIME. 90 tablet 0   ranolazine (RANEXA) 1000 MG SR tablet TAKE 1 TABLET BY MOUTH TWICE A DAY 180 tablet 0   revefenacin (YUPELRI) 175 MCG/3ML nebulizer solution Inhale one vial in nebulizer once daily. Do not mix with other nebulized medications. 90 mL 11   tamsulosin (FLOMAX) 0.4 MG CAPS capsule Take 1 capsule (0.4 mg total) by  mouth daily. 30 capsule 2   ticagrelor (BRILINTA) 60 MG TABS tablet Take 1 tablet (60 mg total) by mouth 2 (two) times daily. 180 tablet 1   tiZANidine (ZANAFLEX) 4 MG tablet TAKE 1 TABLET BY MOUTH EVERY 6 HOURS AS NEEDED FOR MUSCLE SPASMS. 360 tablet 1   VASCEPA 1 g capsule TAKE 2 CAPSULES BY MOUTH TWICE A DAY 360 capsule 1   No current facility-administered medications for this visit.    Allergies:   Naproxen   Social History:  The patient  reports that he has been smoking cigarettes. He started smoking about 32 years ago. He has a 25 pack-year smoking history. He quit smokeless tobacco use about 7 years ago. He reports that he does not drink alcohol and does not use drugs.   Family History:   family history includes Anuerysm in his father; Cancer in his mother; Diabetes in his mother.   Review of Systems: Review of Systems  Constitutional: Negative.   HENT: Negative.    Respiratory:  Positive for cough and shortness of breath.   Cardiovascular: Negative.   Gastrointestinal: Negative.   Musculoskeletal: Negative.   Neurological: Negative.   Psychiatric/Behavioral: Negative.    All other systems reviewed and are negative.   PHYSICAL EXAM: VS:  BP 90/60 (BP Location: Left Arm, Patient Position: Sitting, Cuff Size: Normal)   Pulse 84   Ht 5\' 10"  (1.778 m)   Wt 187 lb (84.8 kg)   SpO2 97%   BMI 26.83 kg/m  , BMI Body mass index is 26.83 kg/m. Constitutional:  oriented to person, place, and time. No distress.  HENT:  Head: Grossly normal Eyes:  no discharge. No scleral icterus.  Neck: No JVD, no carotid bruits  Cardiovascular: Regular rate and rhythm, no murmurs appreciated Pulmonary/Chest: Clear to auscultation bilaterally, no wheezes or rails Abdominal: Soft.  no distension.  no tenderness.  Musculoskeletal: Normal range of motion Neurological:  normal muscle tone. Coordination normal. No atrophy Skin: Skin warm and dry Psychiatric: normal affect, pleasant  Recent  Labs: 11/25/2022: ALT 22; BUN 7; Creatinine, Ser 1.29; Hemoglobin 15.6; Platelets 143; Potassium 3.4; Sodium 144; TSH 2.310    Lipid Panel Lab Results  Component Value Date   CHOL 106 11/25/2022   HDL 35 (L) 11/25/2022   LDLCALC 37 11/25/2022   TRIG 215 (H) 11/25/2022      Wt Readings from Last 3 Encounters:  12/06/22 187 lb (84.8 kg)  11/26/22 192 lb (87.1 kg)  10/18/22 192 lb (87.1 kg)     ASSESSMENT AND PLAN:  Problem List Items Addressed This Visit       Cardiology Problems   Mixed hyperlipidemia   CAD (coronary artery disease) - Primary   Relevant Orders   EKG 12-Lead (Completed)     Other   Type II diabetes mellitus with hyperosmolarity, uncontrolled (HCC)   COPD (chronic obstructive pulmonary disease) (HCC)   Relevant Orders   EKG 12-Lead (Completed)   Spinal stenosis of lumbar region   Chest pain   Relevant Orders   EKG 12-Lead (Completed)   Other Visit Diagnoses     Atherosclerosis of native coronary artery of native heart without angina pectoris       Relevant Orders   EKG 12-Lead (Completed)       Hypotension On last clinic visit we recommended he decrease isosorbide down to 60 daily He reports that he is continued on 60 twice a day given history of unstable angina Given low blood pressure, recommended he decrease metoprolol succinate down to 50 daily If pressure continues to run low we could hold losartan Low pressure confirmed by nursing and myself on left arm  Paroxysmal tachycardia Reports no recent symptoms, Given hypotension recommend he reduce metoprolol succinate down to 50 daily  Coronary disease with stable angina Remote episode of chest pain requiring nitro several months ago, none since then Smoking cessation recommended Cholesterol at goal Recommended he call us if he has additional episodes of chest pain concerning for unstable angina, ischemic workup could be performed  COPD Smoking cessation recommended Still smoking 1  pack/day   Signed, Dossie Arbour, M.D., Ph.D. [facility] [place], [place] [phone]

## 2022-12-06 ENCOUNTER — Ambulatory Visit: Payer: Medicare HMO | Attending: Cardiovascular Disease | Admitting: Cardiovascular Disease

## 2022-12-06 ENCOUNTER — Encounter: Payer: Self-pay | Admitting: Cardiovascular Disease

## 2022-12-06 VITALS — BP 90/60 | HR 84 | Ht 70.0 in | Wt 187.0 lb

## 2022-12-06 DIAGNOSIS — J449 Chronic obstructive pulmonary disease, unspecified: Secondary | ICD-10-CM | POA: Diagnosis not present

## 2022-12-06 DIAGNOSIS — R339 Retention of urine, unspecified: Secondary | ICD-10-CM

## 2022-12-06 DIAGNOSIS — M48061 Spinal stenosis, lumbar region without neurogenic claudication: Secondary | ICD-10-CM

## 2022-12-06 DIAGNOSIS — I25118 Atherosclerotic heart disease of native coronary artery with other forms of angina pectoris: Secondary | ICD-10-CM

## 2022-12-06 DIAGNOSIS — I2510 Atherosclerotic heart disease of native coronary artery without angina pectoris: Secondary | ICD-10-CM | POA: Diagnosis not present

## 2022-12-06 DIAGNOSIS — E1100 Type 2 diabetes mellitus with hyperosmolarity without nonketotic hyperglycemic-hyperosmolar coma (NKHHC): Secondary | ICD-10-CM

## 2022-12-06 DIAGNOSIS — R079 Chest pain, unspecified: Secondary | ICD-10-CM | POA: Diagnosis not present

## 2022-12-06 DIAGNOSIS — E782 Mixed hyperlipidemia: Secondary | ICD-10-CM

## 2022-12-06 DIAGNOSIS — E119 Type 2 diabetes mellitus without complications: Secondary | ICD-10-CM | POA: Diagnosis not present

## 2022-12-06 MED ORDER — ISOSORBIDE MONONITRATE ER 60 MG PO TB24: 60.0000 mg | ORAL_TABLET | Freq: Two times a day (BID) | ORAL | 3 refills | Status: DC

## 2022-12-06 MED ORDER — RANOLAZINE ER 1000 MG PO TB12: 1000.0000 mg | ORAL_TABLET | Freq: Two times a day (BID) | ORAL | 3 refills | Status: DC

## 2022-12-06 MED ORDER — TICAGRELOR 60 MG PO TABS: 60.0000 mg | ORAL_TABLET | Freq: Two times a day (BID) | ORAL | 3 refills | Status: DC

## 2022-12-06 MED ORDER — FUROSEMIDE 40 MG PO TABS: 40.0000 mg | ORAL_TABLET | Freq: Every day | ORAL | 3 refills | Status: DC | PRN

## 2022-12-06 MED ORDER — METOPROLOL SUCCINATE ER 50 MG PO TB24: 100.0000 mg | ORAL_TABLET | Freq: Every day | ORAL | 3 refills | Status: DC

## 2022-12-06 MED ORDER — ATORVASTATIN CALCIUM 80 MG PO TABS: 80.0000 mg | ORAL_TABLET | Freq: Every day | ORAL | 3 refills | Status: DC

## 2022-12-06 NOTE — Patient Instructions (Signed)
Referral to urology for urine retention   Medication Instructions:  Please decrease the metoprolol succinate down to 50 mg daily  If you need a refill on your cardiac medications before your next appointment, please call your pharmacy.   Lab work: No new labs needed  Testing/Procedures: No new testing needed  Follow-Up: At [facility], you and your health needs are our priority.  As part of our continuing mission to provide you with exceptional heart care, we have created designated Provider Care Teams.  These Care Teams include your primary Cardiologist (physician) and Advanced Practice Providers (APPs -  Physician Assistants and Nurse Practitioners) who all work together to provide you with the care you need, when you need it.  You will need a follow up appointment in 12 months  Providers on your designated Care Team:   Nicolasa Ducking, NP Eula Listen, PA-C Cadence Fransico Michael, [place]  COVID-19 Vaccine Information can be found at: [email or web address] For questions related to vaccine distribution or appointments, please email [email or web address] or call [phone].

## 2022-12-07 ENCOUNTER — Other Ambulatory Visit: Payer: Self-pay | Admitting: Family

## 2022-12-07 DIAGNOSIS — E1100 Type 2 diabetes mellitus with hyperosmolarity without nonketotic hyperglycemic-hyperosmolar coma (NKHHC): Secondary | ICD-10-CM

## 2022-12-08 ENCOUNTER — Other Ambulatory Visit: Payer: Self-pay

## 2022-12-08 MED ORDER — ALBUTEROL SULFATE HFA 108 (90 BASE) MCG/ACT IN AERS: 1.0000 | INHALATION_SPRAY | Freq: Four times a day (QID) | RESPIRATORY_TRACT | 5 refills | Status: DC | PRN

## 2022-12-15 IMAGING — RF DG THORACIC SPINE 2V
1 series · 4 of 4 positions shown · non-contrast
Comparison: None.

CLINICAL DATA: Thoracic stimulator placement

EXAM:
THORACIC SPINE 2 VIEWS

[Series 1: dg x-ray · 0.20mm/px · 4 of 4 slices shown]
[im 1/4]
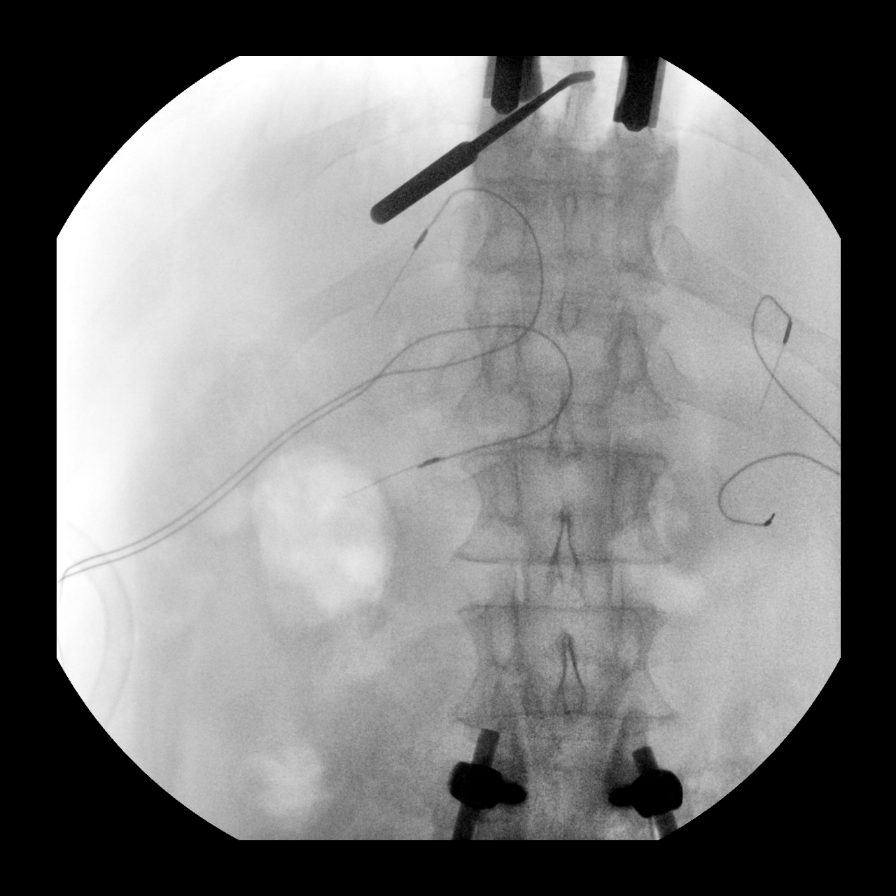
[im 2/4]
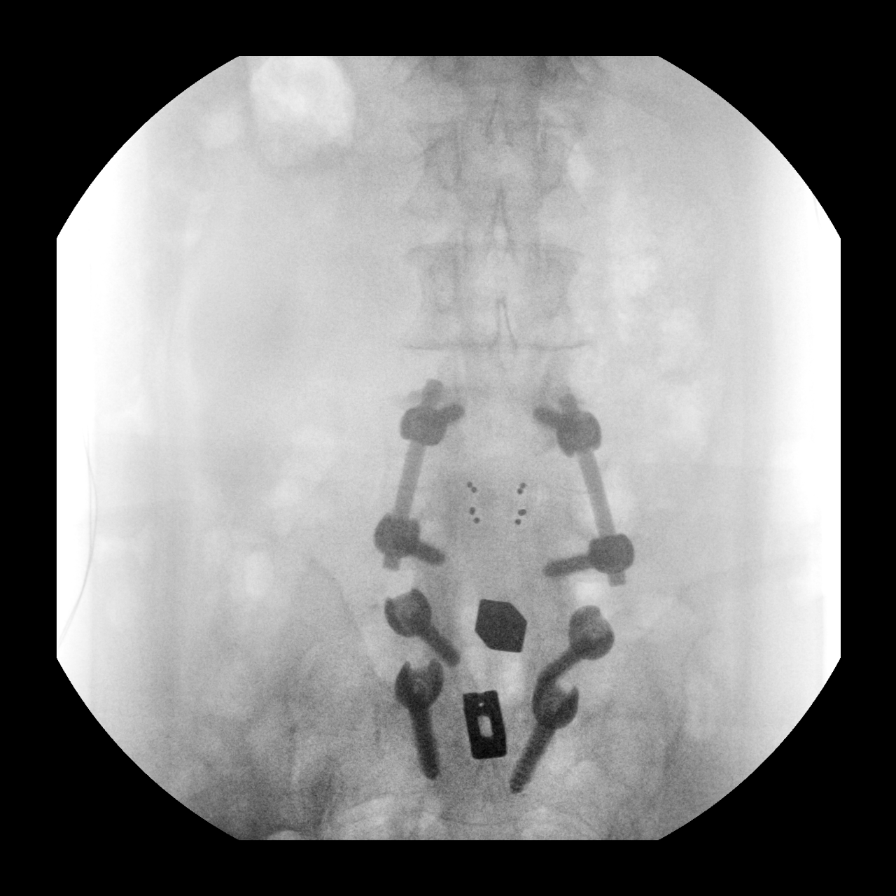
[im 3/4]
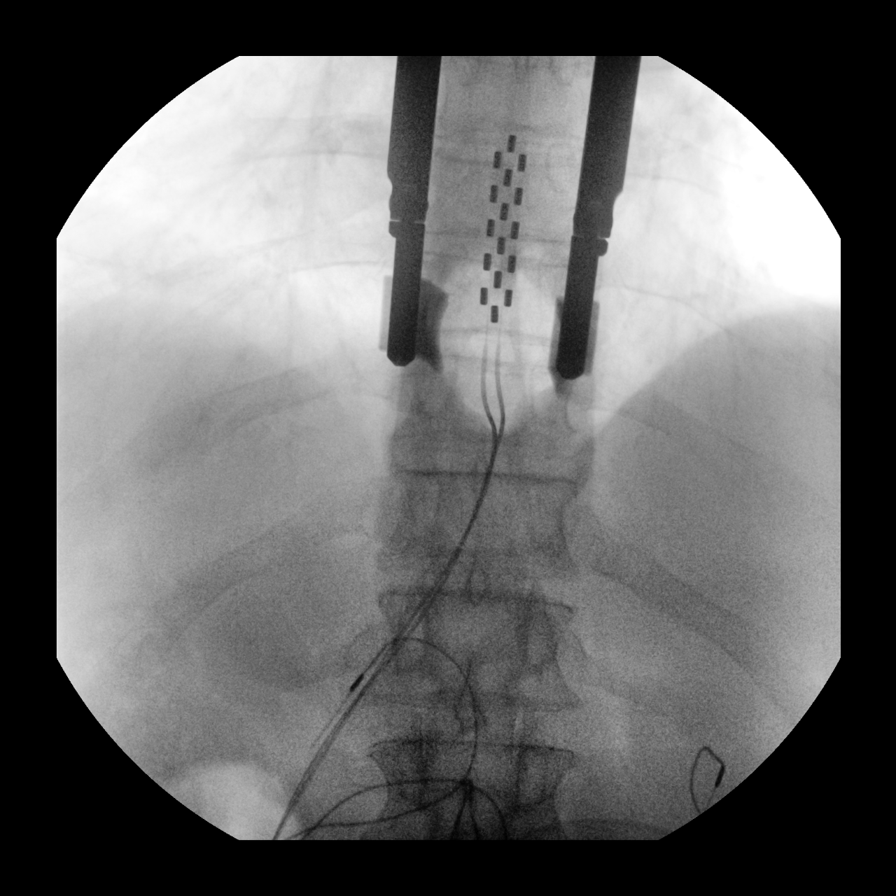
[im 4/4]
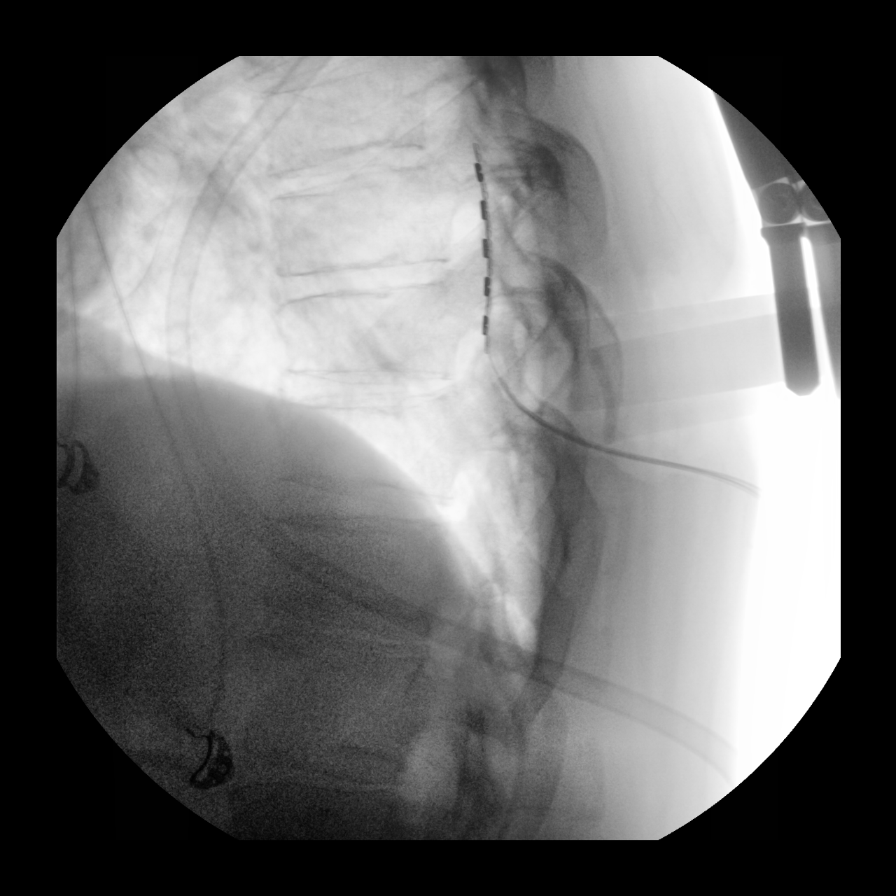

[4 of 4 positions shown; findings below may reference images not displayed]

FINDINGS: Multiple intraoperative spot images demonstrate placement of
thoracic spinal stimulator wires in the lower thoracic spine region.
IMPRESSION: As above

## 2023-01-06 ENCOUNTER — Other Ambulatory Visit: Payer: Self-pay | Admitting: Cardiovascular Disease

## 2023-01-06 DIAGNOSIS — I959 Hypotension, unspecified: Secondary | ICD-10-CM

## 2023-01-06 NOTE — Telephone Encounter (Signed)
Please advise if ok to refill Losartan 25 mg tablet every day. Last filled by: Date: 07/23/2022 Department: [facility] Ordering/Authorizing: Miki Kins, FNP

## 2023-01-07 DIAGNOSIS — J449 Chronic obstructive pulmonary disease, unspecified: Secondary | ICD-10-CM | POA: Diagnosis not present

## 2023-01-11 ENCOUNTER — Ambulatory Visit (INDEPENDENT_AMBULATORY_CARE_PROVIDER_SITE_OTHER): Payer: Medicare HMO | Admitting: Urology

## 2023-01-11 ENCOUNTER — Telehealth: Payer: Self-pay

## 2023-01-11 ENCOUNTER — Other Ambulatory Visit
Admission: RE | Admit: 2023-01-11 | Discharge: 2023-01-11 | Disposition: A | Discharge status: Home / Self Care | Payer: Medicare HMO | Attending: Urology | Admitting: Urology

## 2023-01-11 ENCOUNTER — Encounter: Payer: Self-pay | Admitting: Urology

## 2023-01-11 ENCOUNTER — Other Ambulatory Visit: Payer: Self-pay

## 2023-01-11 VITALS — BP 84/56 | HR 93 | Ht 70.0 in | Wt 189.0 lb

## 2023-01-11 DIAGNOSIS — Z87898 Personal history of other specified conditions: Secondary | ICD-10-CM | POA: Insufficient documentation

## 2023-01-11 DIAGNOSIS — R3989 Other symptoms and signs involving the genitourinary system: Secondary | ICD-10-CM

## 2023-01-11 DIAGNOSIS — R399 Unspecified symptoms and signs involving the genitourinary system: Secondary | ICD-10-CM

## 2023-01-11 DIAGNOSIS — N529 Male erectile dysfunction, unspecified: Secondary | ICD-10-CM | POA: Diagnosis not present

## 2023-01-11 DIAGNOSIS — B379 Candidiasis, unspecified: Secondary | ICD-10-CM

## 2023-01-11 DIAGNOSIS — R339 Retention of urine, unspecified: Secondary | ICD-10-CM | POA: Insufficient documentation

## 2023-01-11 LAB — URINALYSIS, COMPLETE (UACMP) WITH MICROSCOPIC
Bilirubin Urine: NEGATIVE
Glucose, UA: >=500 mg/dL — AB
Ketones, ur: NEGATIVE mg/dL
Nitrite: NEGATIVE
Protein, ur: NEGATIVE mg/dL
Specific Gravity, Urine: 1.01 (ref 1.005–1.030)
WBC, UA: >50 WBC/hpf (ref 0–5)
pH: 6 (ref 5.0–8.0)

## 2023-01-11 LAB — BLADDER SCAN AMB NON-IMAGING

## 2023-01-11 MED ORDER — SULFAMETHOXAZOLE-TRIMETHOPRIM 800-160 MG PO TABS: 1.0000 | ORAL_TABLET | Freq: Two times a day (BID) | ORAL | 0 refills | Status: DC

## 2023-01-11 MED ORDER — FLUCONAZOLE 100 MG PO TABS: 100.0000 mg | ORAL_TABLET | Freq: Every day | ORAL | 0 refills | Status: DC

## 2023-01-11 MED ORDER — UNABLE TO FIND: 0 refills | Status: DC

## 2023-01-11 NOTE — Progress Notes (Signed)
 01/11/23 3:22 PM   Jose Carroll [birth year] [id number]  CC: ED, urinary symptoms  HPI: 57 year old extremely comorbid male with a number of medical issues including CAD/PVD, COPD, diabetes, hypertension, chronic pain on daily narcotics who is referred for erectile dysfunction as well as urinary symptoms of postvoid dribbling and dysuria, and weak stream.  He takes daily nitrate and is not a candidate for oral PDE 5 inhibitors.  He continues to be a heavy smoker.  He is on anticoagulation with Brilinta .  He takes Flomax  long-term which she does feel is helpful with his urinary symptoms.  Urinalysis today is still pending.  He denies any gross hematuria, denies any prior urologic procedures.  Symptoms have been present for about 1 year.   PMH: Past Medical History:  Diagnosis Date   Anginal pain (HCC)    Arthritis    Chronic pain    Chronic, continuous use of opioids    CKD (chronic kidney disease)    Claudication of both lower extremities (HCC)    COPD (chronic obstructive pulmonary disease) (HCC)    Coronary artery disease    a.) LHC 09/10/2014: 50% mLCx, 50% mLAD, 100% p-m RCA; good collaterals from LAD; med mgmt. b.) LHC 06/26/2015: 100% mRCA, 30% dLCx, 40% dLAD; good collaterals from LAD/LCx; med mgmt. c.) LHC 10/28/2017: EF 60%; 100% mRCA, 95% m-dLCx, 30% dLCx; PCI placing a 2.5 x 15 mm Resolute Onyx DES x1 to mLCx. Chronic RCA occlusion with L-R collaterals.   Erectile dysfunction    GERD (gastroesophageal reflux disease)    HLD (hyperlipidemia)    Hypertension    Long term current use of antithrombotics/antiplatelets    a.) ASA + ticagrelor    Peripheral edema    PVD (peripheral vascular disease) (HCC)    Shortness of breath    T2DM (type 2 diabetes mellitus) (HCC)    Vitamin D  deficiency     Surgical History: Past Surgical History:  Procedure Laterality Date   BACK SURGERY     4   CARDIAC CATHETERIZATION N/A 09/10/2014   Procedure: Left Heart Cath and  Coronary Angiogram ;  Surgeon: Denyse DELENA Bathe, MD;  Location: [facility];  Service: Cardiovascular;  Laterality: N/A;   CARDIAC CATHETERIZATION Left 06/26/2015   Procedure: Left Heart Cath and Coronary Angiography;  Surgeon: Denyse DELENA Bathe, MD;  Location: [facility];  Service: Cardiovascular;  Laterality: Left;   CORONARY STENT INTERVENTION N/A 10/28/2017   Procedure: CORONARY STENT INTERVENTION (2.5 x 15 mm Resolute Onyx DES x 1 to mLCx);  Surgeon: Darron Deatrice DELENA, MD;  Location: [facility];  Service: Cardiovascular;  Laterality: N/A;   FETAL SURGERY FOR CONGENITAL HERNIA  2012   FINGER SURGERY     sixth finger removed   HERNIA REPAIR     LEFT HEART CATH AND CORONARY ANGIOGRAPHY Left 10/28/2017   Procedure: LEFT HEART CATH AND CORONARY ANGIOGRAPHY;  Surgeon: Bathe Denyse DELENA, MD;  Location: [facility];  Service: Cardiovascular;  Laterality: Left;   PULSE GENERATOR IMPLANT N/A 12/29/2020   Procedure: PULSE GENERATOR IMPLANT (MEDTRONIC);  Surgeon: Bluford Standing, MD;  Location: [facility];  Service: Neurosurgery;  Laterality: N/A;   SUBCLAVIAN ARTERY STENT     THORACIC LAMINECTOMY FOR SPINAL CORD STIMULATOR N/A 12/22/2020   Procedure: THORACIC LAMINECTOMY FOR SPINAL CORD STIMULATOR PADDLE TRIAL (MEDTRONIC);  Surgeon: Bluford Standing, MD;  Location: [facility];  Service: Neurosurgery;  Laterality: N/A;   TONSILLECTOMY  Family History: Family History  Problem Relation Age of Onset   Cancer Mother    Diabetes Mother    Anuerysm Father     Social History:  reports that he has been smoking cigarettes. He started smoking about 32 years ago. He has a 25 pack-year smoking history. He quit smokeless tobacco use about 7 years ago. He reports that he does not drink alcohol and does not use drugs.  Physical Exam: BP (!) 84/56   Pulse 93   Ht 5' 10 (1.778 m)   Wt 189 lb (85.7 kg)   BMI 27.12 kg/m    Constitutional:  Alert and oriented, No acute  distress. Cardiovascular: No clubbing, cyanosis, or edema. Respiratory: Normal respiratory effort, no increased work of breathing. GI: Abdomen is soft, nontender, nondistended, no abdominal masses GU: Phallus with narrow meatus, testicles 20 cc and descended bilaterally with no testicular masses, easily palpable, minimally tender, and mobile 2 cm left suspected epididymal cyst  Laboratory Data: Reviewed, see HPI  Assessment & Plan:   57 year old male with numerous comorbidities here for ED and urinary symptoms.  I think we need to have realistic expectations with his overall very poor health.  In terms of ED, I recommended starting with a morning testosterone  level.  We reviewed that he is not a candidate for oral PDE 5 inhibitors with his daily nitrate use, he previously tried a vacuum erection device with no improvement.  He is interested in penile injections and a test dose of Trimix was sent in, and he will return for dose titration and teaching.  Counseled smoking cessation.  In terms of his urinary symptoms, will follow-up urinalysis and culture today.  Based on his relatively narrow meatus on exam, I recommended cystoscopy to differentiate between urethral pathology versus BPH, not a good candidate for PSA screening with his numerous comorbidities.  Continue Flomax .  RTC with PA for Trimix dose adjustment and teaching, follow-up testosterone  levels Follow-up with me for cystoscopy for further evaluation of his urinary symptoms   Redell Burnet, MD 01/11/2023  [facility] [address], [address] [place], [place] [address] [phone]

## 2023-01-11 NOTE — Telephone Encounter (Signed)
-----   Message from [name] sent at 01/11/2023  4:15 PM EST ----- Urinalysis highly suspicious for UTI.  Recommend Bactrim  DS twice daily x 14 days, as well as fluconazole  100 mg x 7 days.  We will contact him with culture results if those need to be changed  Redell Burnet, MD 01/11/2023

## 2023-01-11 NOTE — Patient Instructions (Signed)

## 2023-01-11 NOTE — Telephone Encounter (Signed)
Called pt no answer. Left detailed message for pt informing him of information below. RX sent.

## 2023-01-12 LAB — TESTOSTERONE: Testosterone: 568 ng/dL (ref 264–916)

## 2023-01-13 ENCOUNTER — Other Ambulatory Visit: Payer: Self-pay | Admitting: Family

## 2023-01-13 LAB — URINE CULTURE: Culture: NO GROWTH

## 2023-01-18 DIAGNOSIS — E119 Type 2 diabetes mellitus without complications: Secondary | ICD-10-CM | POA: Diagnosis not present

## 2023-01-18 LAB — MISC LABCORP TEST (SEND OUT): Labcorp test code: 86884

## 2023-01-23 ENCOUNTER — Encounter: Payer: Self-pay | Admitting: Family

## 2023-01-23 NOTE — Assessment & Plan Note (Signed)
 Continue current therapy for lipid control. Will modify as needed based on labwork results.

## 2023-01-23 NOTE — Assessment & Plan Note (Signed)
 Checking labs today. Will call pt. With results  Continue current diabetes POC, as patient has been well controlled on current regimen.  Will adjust meds if needed based on labs.

## 2023-01-24 ENCOUNTER — Other Ambulatory Visit: Payer: Self-pay | Admitting: Internal Medicine

## 2023-01-25 ENCOUNTER — Other Ambulatory Visit: Payer: Self-pay | Admitting: Family

## 2023-01-26 MED ORDER — INCRUSE ELLIPTA 62.5 MCG/ACT IN AEPB: 1.0000 | INHALATION_SPRAY | Freq: Every day | RESPIRATORY_TRACT | 5 refills | Status: DC

## 2023-02-02 DIAGNOSIS — F1120 Opioid dependence, uncomplicated: Secondary | ICD-10-CM | POA: Diagnosis not present

## 2023-02-02 DIAGNOSIS — M79662 Pain in left lower leg: Secondary | ICD-10-CM | POA: Diagnosis not present

## 2023-02-02 DIAGNOSIS — M545 Low back pain, unspecified: Secondary | ICD-10-CM | POA: Diagnosis not present

## 2023-02-02 DIAGNOSIS — Z79891 Long term (current) use of opiate analgesic: Secondary | ICD-10-CM | POA: Diagnosis not present

## 2023-02-02 DIAGNOSIS — M79661 Pain in right lower leg: Secondary | ICD-10-CM | POA: Diagnosis not present

## 2023-02-02 NOTE — Progress Notes (Deleted)
   02/02/2023 1:09 PM  Jose Carroll [birth year] [id number]   Referring provider: Miki Kins, FNP [address] [place],  [place] [address]  Urological history: 1.  Erectile dysfunction -Contributing factors of age, BPH, CAD/PVD, COPD, diabetes, hypertension, chronic pain on daily narcotics, anticoagulants and smoking.   -testosterone level (12/2022) 568 -Not a candidate for PDE 5 inhibitors due to nitrate use  2. LU TS -cysto pending  No chief complaint on file.  HPI: Jose Carroll is a 58 y.o. male who presents today for Trimix titration.    The procedure is discussed with patient.  He is allowed to ask questions.  Questions were answered to his satisfaction.  We were able to proceed to the titration.  Physical Exam:  There were no vitals taken for this visit.  Constitutional:  Well nourished. Alert and oriented, No acute distress. GU: No CVA tenderness.  No bladder fullness or masses.  Patient with circumcised/uncircumcised phallus. ***Foreskin easily retracted***  Urethral meatus is patent.  No penile discharge. No penile lesions or rashes. Scrotum without lesions, cysts, rashes and/or edema.   Psychiatric: Normal mood and affect.   Procedure *** Patient's left corpus cavernosum is identified.  An area near the base of the penis is cleansed with rubbing alcohol.  Careful to avoid the dorsal vein, *** mcg of Trimix (papaverine 30 mg, phentolamine 1 mg and prostaglandin E1 10 mcg, Lot # ***@*** exp # *** is injected at a 90 degree angle into the left *** corpus cavernosum near the base of the penis.  Patient experienced a very firm erection in 15 minutes.     Assessment & Plan:    1.  Erectile dysfunction -Advised patient of the condition of priapism, painful erection lasting for more than four hours, and to contact the office or seek treatment in the ED immediately      No follow-ups on file.  Michiel Cowboy, PA-C   [facility] [address] [place], [place] [address] [phone]

## 2023-02-03 DIAGNOSIS — J449 Chronic obstructive pulmonary disease, unspecified: Secondary | ICD-10-CM | POA: Diagnosis not present

## 2023-02-07 ENCOUNTER — Ambulatory Visit: Payer: Medicare HMO | Admitting: Urology

## 2023-02-07 DIAGNOSIS — N529 Male erectile dysfunction, unspecified: Secondary | ICD-10-CM

## 2023-02-15 ENCOUNTER — Ambulatory Visit: Payer: Self-pay | Admitting: Urology

## 2023-02-15 ENCOUNTER — Encounter: Payer: Self-pay | Admitting: Urology

## 2023-02-15 ENCOUNTER — Other Ambulatory Visit: Payer: Self-pay | Admitting: Family

## 2023-02-15 VITALS — BP 107/75 | HR 82 | Ht 70.0 in | Wt 183.0 lb

## 2023-02-15 DIAGNOSIS — R399 Unspecified symptoms and signs involving the genitourinary system: Secondary | ICD-10-CM

## 2023-02-15 DIAGNOSIS — N35912 Unspecified bulbous urethral stricture, male: Secondary | ICD-10-CM

## 2023-02-15 DIAGNOSIS — R351 Nocturia: Secondary | ICD-10-CM | POA: Diagnosis not present

## 2023-02-15 NOTE — Patient Instructions (Addendum)
 You have a narrowing in your urethra called a urethral stricture that is causing your urinary symptoms.  This is treated with a balloon dilation under anesthesia to stretch the area.  We will also be able to look at your prostate and bladder after opening the scarred area.  You have a catheter for 2 days.  There is a low risk of bleeding or infection, and the success rate is 80%.     Urethral Stricture  Urethral stricture is when the tube that drains pee (urine) from the bladder out of the body (urethra) becomes too narrow. The urethra can become narrow because of scar tissue, infection, surgery, or an injury. This can make it difficult to pee (urinate). In females, the urethra opens above the vaginal opening. In males, the urethra opens at the tip of the penis, and the urethra is much longer than it is in females. Because of the length of the male urethra, urethral stricture is much more common in males. What are the causes? In males and females, common causes of urethral stricture include: Urinary tract infection (UTI). Sexually transmitted infection (STI). Using a soft tube in the urethra to drain pee from the bladder (urinary catheter). Urinary tract surgery. In males, common causes of urethral stricture include: A severe injury to the pelvis. Prostate surgery. Injury to the penis. In many cases, the cause of urethral stricture is not known. What increases the risk? You are more likely to develop this condition if you: Are male. Males who have had prostate surgery are at risk of developing this condition. Use a urinary catheter. Have had urinary tract surgery. What are the signs or symptoms? The main symptom of this condition is trouble peeing. This may cause decreased pee flow, dribbling, or spraying of pee. Other symptom of this condition may include: Frequent UTIs. Blood in the pee. Pain when peeing. Swelling of the penis in males. Not being able to pee. How is this  diagnosed? This condition may be diagnosed based on: Your medical history and a physical exam. Tests of your pee to check for infection or bleeding. X-rays. Ultrasound. Retrograde urethrogram. With this test, a dye is injected into the urethra and then an X-ray is taken. Urethroscopy. This is when a thin tube with a light and camera on the end (urethroscope) is used to look at the urethra. A CT scan or MRI. How is this treated? This condition is treated with surgery or other procedures. The type of surgery that you have depends on the severity of your condition. You may have: Urethral dilation. In this procedure, the narrow part of the urethra is stretched open (dilated) with dilating instruments or a small balloon. Urethrotomy. In this procedure, a urethroscope is placed into the urethra, and the narrow part of the urethra is cut open with a surgical blade or laser inserted through the urethroscope. Urethroplasty. In this procedure, an incision is made in the urethra and the narrow part is removed. Then, the urethra is reconstructed. Follow these instructions at home: Take over-the-counter and prescription medicines only as told by your health care provider. If you were prescribed antibiotics, take them as told by your provider. Do not stop using the antibiotic even if you start to feel better. Drink enough fluid to keep your pee pale yellow. Keep all follow-up visits. Your provider will check your healing and adjust your treatment plan as needed. Contact a health care provider if: You have frequent peeing or you are only peeing small  amounts often. You feel the need to pee urgently. You have pain or burning when you pee. Your pee smells bad or unusual. Your pee is bloody or cloudy. You have pain in your lower abdomen or back. Your genital area is swollen, bruised, or discolored. This includes: The penis, scrotum, and inner thighs for males. The outer genital organs (vulva) and inner  thighs for females. You have a fever. You develop swelling in your legs. Get help right away if: You cannot pee. You have trouble breathing. These symptoms may be an emergency. Get help right away. Call 911. Do not wait to see if the symptoms will go away. Do not drive yourself to the hospital. This information is not intended to replace advice given to you by your health care provider. Make sure you discuss any questions you have with your health care provider. Document Revised: 10/22/2021 Document Reviewed: 10/22/2021 Elsevier Patient Education  2024 [employer].

## 2023-02-15 NOTE — Progress Notes (Signed)
 Cystoscopy Procedure Note:  Indication: Urinary symptoms, nocturia  After informed consent and discussion of the procedure and its risks, Jose Carroll was positioned and prepped in the standard fashion.  The meatus was tight and was gently dilated with 18 French Hayman dilator distally.Cystoscopy was performed with a flexible cystoscope.  There was a ~42F urethral stricture in the bulbourethra.  Findings: Urethral stricture  ------------------------------------------------------------------------------------  Assessment and Plan: Comorbid 58 year old male with CAD on anticoagulation who was referred for ED and urinary symptoms with weak stream, dribbling, nocturia.  In terms of his ED, he cannot take PDE 5 inhibitors because of his daily nitrate use, I recommended a trial of Trimix, but this was not affordable with his insurance and he would like to put on the back burner for the time being.  On cystoscopy today found to have a urethral stricture as the likely cause of his urinary symptoms.  We discussed risk of potential pathology in the prostate or bladder that cannot be identified today secondary to stricture.  I recommended OR for retrograde urethrogram, Optilume balloon dilation, and cystoscopy.  Risks and benefits discussed at length including 20% risk of recurrence.  Schedule retrograde urethrogram, Optilume balloon dilation, cystoscopy  Jose Burnet, MD 02/15/2023

## 2023-02-16 ENCOUNTER — Other Ambulatory Visit: Payer: Self-pay

## 2023-02-16 DIAGNOSIS — N35912 Unspecified bulbous urethral stricture, male: Secondary | ICD-10-CM

## 2023-02-16 NOTE — Progress Notes (Deleted)
   02/16/2023 3:45 PM  Jose Carroll Jul 10, 1965 [id number]   Referring provider: Miki Kins, FNP [address] [place],  [place] [address]  Urological history: 1.  Erectile dysfunction -Contributing factors of age, BPH, CAD/PVD, COPD, diabetes, hypertension, chronic pain on daily narcotics, anticoagulants and smoking.   -testosterone level (12/2022) 568 -Not a candidate for PDE 5 inhibitors due to nitrate use  2. LU TS -cysto (02/2023) - urethral stricture disease ~ 83F bulbourethral stricture -scheduled for Optilume dilation   No chief complaint on file.  HPI: Jose Carroll is a 58 y.o. male who presents today for Trimix titration.    The procedure is discussed with patient.  He is allowed to ask questions.  Questions were answered to his satisfaction.  We were able to proceed to the titration.  Physical Exam:  There were no vitals taken for this visit.  Constitutional:  Well nourished. Alert and oriented, No acute distress. GU: No CVA tenderness.  No bladder fullness or masses.  Patient with circumcised/uncircumcised phallus. ***Foreskin easily retracted***  Urethral meatus is patent.  No penile discharge. No penile lesions or rashes. Scrotum without lesions, cysts, rashes and/or edema.   Psychiatric: Normal mood and affect.   Procedure *** Patient's left corpus cavernosum is identified.  An area near the base of the penis is cleansed with rubbing alcohol.  Careful to avoid the dorsal vein, *** mcg of Trimix (papaverine 30 mg, phentolamine 1 mg and prostaglandin E1 10 mcg, Lot # ***@*** exp # *** is injected at a 90 degree angle into the left *** corpus cavernosum near the base of the penis.  Patient experienced a very firm erection in 15 minutes.     Assessment & Plan:    1.  Erectile dysfunction -Advised patient of the condition of priapism, painful erection lasting for more than four hours, and to contact the office or seek treatment in the ED immediately       No follow-ups on file.  Michiel Cowboy, PA-C   [facility] [address] [place], [place] [address] [phone]

## 2023-02-16 NOTE — Progress Notes (Signed)
 Surgical Physician Order Form [facility]  Dr. Redell Burnet, MD  * Scheduling expectation : Next Available  *Length of Case: 30 minutes  *Clearance needed: yes, cardiology  *Anticoagulation Instructions: Hold all anticoagulants  *Aspirin  Instructions: Ok to continue Aspirin   *Post-op visit Date/Instructions:  1-3 day cath removal  *Diagnosis: Urethral Stricture  *Procedure: Retrograde urethrogram, cystoscopy, Optilume balloon dilation of urethral stricture   Additional orders: N/A  -Admit type: OUTpatient  -Anesthesia: General  -VTE Prophylaxis Standing Order SCD's       Other:   -Standing Lab Orders Per Anesthesia    Lab other: UA&Urine Culture  -Standing Test orders EKG/Chest x-ray per Anesthesia       Test other:   - Medications:  Ancef  2gm IV  -Other orders:  N/A

## 2023-02-17 ENCOUNTER — Telehealth: Payer: Self-pay | Admitting: *Deleted

## 2023-02-17 ENCOUNTER — Telehealth: Payer: Self-pay

## 2023-02-17 ENCOUNTER — Other Ambulatory Visit: Payer: Self-pay

## 2023-02-17 MED ORDER — ALBUTEROL SULFATE HFA 108 (90 BASE) MCG/ACT IN AERS: 2.0000 | INHALATION_SPRAY | RESPIRATORY_TRACT | 11 refills | Status: DC | PRN

## 2023-02-17 NOTE — Progress Notes (Signed)
   [facility] Surgical Posting Form  Surgery Date: Date: 03/11/2023  Surgeon: Dr. Redell Burnet, MD  Inpt ( No  )   Outpt (Yes)   Obs ( No  )   Diagnosis: N35.912 Urethral Stricture  -CPT: 48389, [phone]  Surgery:Cystoscopy with Urethral Balloon Dilation using Optilume and Retrograde Urethrogram  Stop Anticoagulations: Yes hold Brilinta , may continue ASA  Cardiac/Medical/Pulmonary Clearance needed: Yes  Clearance needed from Dr: Gollan  Clearance request sent on: Date: 02/17/23   *Orders entered into EPIC  Date: 02/17/23   *Case booked in EPIC  Date: 02/16/2023  *Notified pt of Surgery: Date: 02/16/2023  PRE-OP UA & CX: yes, will obtain on 03/01/2023 at [facility]  *Placed into Prior Authorization Work [place] Date: 02/17/23  Assistant/laser/rep:No

## 2023-02-17 NOTE — Telephone Encounter (Signed)
-----   Message from [facility] Melissa H sent at 02/17/2023 11:26 AM EST ----- Surgical Clearance

## 2023-02-17 NOTE — Telephone Encounter (Signed)
   Pre-operative Risk Assessment    Patient Name: Jose Carroll  DOB: September 18, 1965 MRN: [id number]   Date of last office visit: 12/06/22 DR. GOLLAN Date of next office visit: NONE   Request for Surgical Clearance    Procedure:   Cystoscopy with Urethral Balloon Dilation using Optilume and Retrograde Urethrogram   Date of Surgery:  Clearance 03/11/23                                Surgeon:  DR. REDELL BURNET Surgeon's Group or Practice Name:  [facility] Phone number:  [phone] MELISSA HADLEY, CMA Fax number:  [phone]   Type of Clearance Requested:   - Medical  - Pharmacy:  Hold Ticagrelor  (Brilinta )     Type of Anesthesia:  General    Additional requests/questions:    [name]   02/17/2023, 12:41 PM   Signed       Phone Number: [phone] for Surgical Coordinator Fax Number: [phone]   REQUEST FOR SURGICAL CLEARANCE                                           Date: Date: 02/17/23   Faxed to: Dr. Perla, [facility]   Surgeon: Dr. Redell Burnet, MD             Date of Surgery: 03/11/2023   Operation: Cystoscopy with Urethral Balloon Dilation using Optilume and Retrograde Urethrogram    Anesthesia Type: General    Diagnosis: Urethral Stricture   Patient Requires:    Cardiac / Vascular Clearance : Yes   Reason: Will need patient to hold Brilinta  prior to surgery   Risk Assessment:     Low   []       Moderate   []     High   []                 This patient is optimized for surgery  YES []       NO   []      I recommend further assessment/workup prior to surgery. YES []      NO  []    Appointment scheduled for: _______________________    Further recommendations: ____________________________________        Physician Signature:__________________________________    Printed Name: ________________________________________    Date: _________________

## 2023-02-17 NOTE — Progress Notes (Signed)
  Phone Number: [phone] for Surgical Coordinator Fax Number: [phone]  REQUEST FOR SURGICAL CLEARANCE       Date: Date: 02/17/23  Faxed to: Dr. Perla, [facility]  Surgeon: Dr. Redell Burnet, MD     Date of Surgery: 03/11/2023  Operation: Cystoscopy with Urethral Balloon Dilation using Optilume and Retrograde Urethrogram   Anesthesia Type: General   Diagnosis: Urethral Stricture  Patient Requires:   Cardiac / Vascular Clearance : Yes  Reason: Will need patient to hold Brilinta  prior to surgery  Risk Assessment:    Low   []       Moderate   []     High   []           This patient is optimized for surgery  YES []       NO   []    I recommend further assessment/workup prior to surgery. YES []      NO  []   Appointment scheduled for: _______________________   Further recommendations: ____________________________________     Physician Signature:__________________________________   Printed Name: ________________________________________   Date: _________________

## 2023-02-17 NOTE — Telephone Encounter (Signed)
  Per Dr. Francisca, Patient is to be scheduled for Cystoscopy with Urethral Balloon Dilation using Optilume and Retrograde Urethrogram   Jose Carroll was contacted and possible surgical dates were discussed, Friday February 28th, 2025 was agreed upon for surgery.   Patient was instructed that Dr. Francisca will require them to provide a pre-op UA & CX prior to surgery. This was ordered and scheduled drop off appointment was made for 03/01/2023 at the [facility].    Patient was directed to call [phone] between 1-3pm the day before surgery to find out surgical arrival time.  Instructions were given not to eat or drink from midnight on the night before surgery and have a driver for the day of surgery. On the surgery day patient was instructed to enter through the [facility] entrance of [facility] report the Same Day Surgery desk.   Pre-Admit Testing will be in contact via phone to set up an interview with the anesthesia team to review your history and medications prior to surgery.   Reminder of this information was sent via mail to the patient.   Patient is to hold anticoag's per Dr. Francisca.  Currently taking Brilinta  by Dr. Gollan. Clearance requested to stop 7days prior to surgery, clearance form was faxed to [facility].  Awaiting response.

## 2023-02-18 NOTE — Telephone Encounter (Signed)
 Jose Carroll 58 year old male is requesting preoperative cardiac evaluation for cystoscopy with urethralI balloon dilation.  He was seen in clinic on 12/06/2022.  Would you be able to comment on cardiac risk and may his Brilinta  be held prior to his procedure?  Please direct your response to CV DIV preop pool.  Thank you for your help.  [name]. Shanita Kanan NP-C     02/18/2023, 11:20 AM [facility] [address] Office [phone] Fax [phone]

## 2023-02-19 ENCOUNTER — Other Ambulatory Visit: Payer: Self-pay | Admitting: Family

## 2023-02-21 ENCOUNTER — Ambulatory Visit: Payer: Self-pay | Admitting: Urology

## 2023-02-21 NOTE — Telephone Encounter (Signed)
   Primary Cardiologist: None  Chart reviewed as part of pre-operative protocol coverage. Given past medical history and time since last visit, based on ACC/AHA guidelines, Jose Carroll would be at acceptable risk for the planned procedure without further cardiovascular testing.   Patient was advised that if he develops new symptoms prior to surgery to contact our office to arrange a follow-up appointment.  He verbalized understanding.  Per Dr. Gollan, primary cardiologist, pt may hold Brilinta  5-7 days prior to procedure and should resume as soon as he is hemodynamically stable.   I will route this recommendation to the requesting party via Epic fax function and remove from pre-op pool.  Please call with questions.  Gerldine Koch, NP-C  02/21/2023, 11:39 AM [address] Office [phone] Fax [phone]

## 2023-02-23 ENCOUNTER — Telehealth: Payer: Self-pay

## 2023-02-23 NOTE — Telephone Encounter (Signed)
After speaking with Dr. Richardo Hanks about heart care recommendations to hold patients blood thinner. Dr. Richardo Hanks would like for patient to hold for 6 days prior to surgery, last dosage on 02/21. Patient made aware.

## 2023-02-28 ENCOUNTER — Other Ambulatory Visit: Payer: Medicare HMO

## 2023-02-28 ENCOUNTER — Ambulatory Visit: Payer: Medicare HMO | Admitting: Family

## 2023-02-28 DIAGNOSIS — E1100 Type 2 diabetes mellitus with hyperosmolarity without nonketotic hyperglycemic-hyperosmolar coma (NKHHC): Secondary | ICD-10-CM

## 2023-02-28 DIAGNOSIS — E782 Mixed hyperlipidemia: Secondary | ICD-10-CM | POA: Diagnosis not present

## 2023-02-28 DIAGNOSIS — I10 Essential (primary) hypertension: Secondary | ICD-10-CM

## 2023-03-01 ENCOUNTER — Other Ambulatory Visit
Admission: RE | Admit: 2023-03-01 | Discharge: 2023-03-01 | Disposition: A | Discharge status: Home / Self Care | Payer: Medicare HMO | Attending: Urology | Admitting: Urology

## 2023-03-01 DIAGNOSIS — N35912 Unspecified bulbous urethral stricture, male: Secondary | ICD-10-CM | POA: Diagnosis present

## 2023-03-01 DIAGNOSIS — Z01812 Encounter for preprocedural laboratory examination: Secondary | ICD-10-CM | POA: Insufficient documentation

## 2023-03-01 LAB — LIPID PANEL
Chol/HDL Ratio: 2.8 {ratio} (ref 0.0–5.0)
Cholesterol, Total: 87 mg/dL — ABNORMAL LOW (ref 100–199)
HDL: 31 mg/dL — ABNORMAL LOW (ref >39)
LDL Chol Calc (NIH): 19 mg/dL (ref 0–99)
Triglycerides: 242 mg/dL — ABNORMAL HIGH (ref 0–149)
VLDL Cholesterol Cal: 37 mg/dL (ref 5–40)

## 2023-03-01 LAB — CBC WITH DIFFERENTIAL/PLATELET
Basophils Absolute: 0.1 10*3/uL (ref 0.0–0.2)
Basos: 1 %
EOS (ABSOLUTE): 0.2 10*3/uL (ref 0.0–0.4)
Eos: 2 %
Hematocrit: 44.5 % (ref 37.5–51.0)
Hemoglobin: 14.7 g/dL (ref 13.0–17.7)
Immature Grans (Abs): 0 10*3/uL (ref 0.0–0.1)
Immature Granulocytes: 0 %
Lymphocytes Absolute: 2.1 10*3/uL (ref 0.7–3.1)
Lymphs: 19 %
MCH: 29.6 pg (ref 26.6–33.0)
MCHC: 33 g/dL (ref 31.5–35.7)
MCV: 90 fL (ref 79–97)
Monocytes Absolute: 0.6 10*3/uL (ref 0.1–0.9)
Monocytes: 6 %
Neutrophils Absolute: 7.9 10*3/uL — ABNORMAL HIGH (ref 1.4–7.0)
Neutrophils: 72 %
Platelets: 158 10*3/uL (ref 150–450)
RBC: 4.96 x10E6/uL (ref 4.14–5.80)
RDW: 14.2 % (ref 11.6–15.4)
WBC: 11 10*3/uL — ABNORMAL HIGH (ref 3.4–10.8)

## 2023-03-01 LAB — CMP14+EGFR
ALT: 26 [IU]/L (ref 0–44)
AST: 20 [IU]/L (ref 0–40)
Albumin: 3.9 g/dL (ref 3.8–4.9)
Alkaline Phosphatase: 171 [IU]/L — ABNORMAL HIGH (ref 44–121)
BUN/Creatinine Ratio: 8 — ABNORMAL LOW (ref 9–20)
BUN: 13 mg/dL (ref 6–24)
Bilirubin Total: 0.4 mg/dL (ref 0.0–1.2)
CO2: 28 mmol/L (ref 20–29)
Calcium: 9.2 mg/dL (ref 8.7–10.2)
Chloride: 101 mmol/L (ref 96–106)
Creatinine, Ser: 1.64 mg/dL — ABNORMAL HIGH (ref 0.76–1.27)
Globulin, Total: 2.6 g/dL (ref 1.5–4.5)
Glucose: 107 mg/dL — ABNORMAL HIGH (ref 70–99)
Potassium: 3.1 mmol/L — ABNORMAL LOW (ref 3.5–5.2)
Sodium: 143 mmol/L (ref 134–144)
Total Protein: 6.5 g/dL (ref 6.0–8.5)
eGFR: 49 mL/min/{1.73_m2} — ABNORMAL LOW (ref >59)

## 2023-03-01 LAB — URINALYSIS, COMPLETE (UACMP) WITH MICROSCOPIC
Bilirubin Urine: NEGATIVE
Glucose, UA: 250 mg/dL — AB
Ketones, ur: NEGATIVE mg/dL
Nitrite: NEGATIVE
Protein, ur: NEGATIVE mg/dL
Specific Gravity, Urine: 1.015 (ref 1.005–1.030)
WBC, UA: >50 WBC/hpf (ref 0–5)
pH: 7 (ref 5.0–8.0)

## 2023-03-01 LAB — HEMOGLOBIN A1C
Est. average glucose Bld gHb Est-mCnc: 146 mg/dL
Hgb A1c MFr Bld: 6.7 % — ABNORMAL HIGH (ref 4.8–5.6)

## 2023-03-02 LAB — URINE CULTURE: Culture: NO GROWTH

## 2023-03-03 ENCOUNTER — Inpatient Hospital Stay
Admission: RE | Admit: 2023-03-03 | Discharge: 2023-03-03 | Disposition: A | Discharge status: Home / Self Care | Payer: Medicare HMO | Source: Ambulatory Visit

## 2023-03-03 ENCOUNTER — Telehealth: Payer: Self-pay

## 2023-03-03 HISTORY — DX: Retention of urine, unspecified: R33.9

## 2023-03-03 HISTORY — DX: Atherosclerotic heart disease of native coronary artery with other forms of angina pectoris: I25.118

## 2023-03-03 HISTORY — DX: Unspecified bulbous urethral stricture, male: N35.912

## 2023-03-03 NOTE — Pre-Procedure Instructions (Signed)
 PAT call attempted, patient reports that he is cancelling his procedure scheduled on 03/11/23 with Dr. Richardo Hanks due to family issues, he reports that he made office aware on 03/02/23. I sent Letta Kocher a secure message making her aware as his surgery is still posted. She reports that patient did not notify her that he was cancelling surgery,

## 2023-03-03 NOTE — Patient Instructions (Signed)
 Your procedure is scheduled on: 03/11/23 - Friday Report to the Registration Desk on the 1st floor of the [facility]. To find out your arrival time, please call [phone] between 1PM - 3PM on: 03/10/23 - Thursday If your arrival time is 6:00 am, do not arrive before that time as the Medical Mall entrance doors do not open until 6:00 am.  REMEMBER: Instructions that are not followed completely may result in serious medical risk, up to and including death; or upon the discretion of your surgeon and anesthesiologist your surgery may need to be rescheduled.  Do not eat food or drink any liquids after midnight the night before surgery.  No gum chewing or hard candies.   One week prior to surgery: Stop Anti-inflammatories (NSAIDS) such as Advil, Aleve, Ibuprofen, Motrin, Naproxen, Naprosyn and Aspirin based products such as Excedrin, Goody's Powder, BC Powder.You may take Tylenol if needed for pain up until the day of surgery.  Stop ANY OVER THE COUNTER supplements until after surgery : Multiple Vitamin,    HOLD Dulaglutide (TRULICITY) 7 days prior to your procedure.  HOLD empagliflozin (JARDIANCE) 3 days prior to your procedure beginning 03/08/23.  HOLD furosemide (LASIX) on the day of your procedure.  HOLD losartan (COZAAR) the morning for your procedure.  HOLD ticagrelor (BRILINTA) beginning 03/05/23.  TRESIBA FLEXTOUCH inject 1/2 of your insulin on the night before your procedure.  ON THE DAY OF SURGERY ONLY TAKE THESE MEDICATIONS WITH SIPS OF WATER:  budesonide (PULMICORT)  formoterol (PERFOROMIST)  gabapentin (NEURONTIN)  isosorbide mononitrate (IMDUR  metoprolol succinate  OXYCONTIN  pantoprazole (PROTONIX)  ranolazine (RANEXA)  revefenacin (YUPELRI)  tamsulosin (FLOMAX)  VASCEPA   Use albuterol (VENTOLIN HFA)  on the day of surgery and bring to the hospital.   No Alcohol for 24 hours before or after surgery.  No Smoking including e-cigarettes for 24 hours  before surgery.  No chewable tobacco products for at least 6 hours before surgery.  No nicotine patches on the day of surgery.  Do not use any "recreational" drugs for at least a week (preferably 2 weeks) before your surgery.  Please be advised that the combination of cocaine and anesthesia may have negative outcomes, up to and including death. If you test positive for cocaine, your surgery will be cancelled.  On the morning of surgery brush your teeth with toothpaste and water, you may rinse your mouth with mouthwash if you wish. Do not swallow any toothpaste or mouthwash.  Use CHG Soap or wipes as directed on instruction sheet.  Do not wear jewelry, make-up, hairpins, clips or nail polish.  For welded (permanent) jewelry: bracelets, anklets, waist bands, etc.  Please have this removed prior to surgery.  If it is not removed, there is a chance that hospital personnel will need to cut it off on the day of surgery.  Do not wear lotions, powders, or perfumes.   Do not shave body hair from the neck down 48 hours before surgery.  Contact lenses, hearing aids and dentures may not be worn into surgery.  Do not bring valuables to the hospital. [facility] is not responsible for any missing/lost belongings or valuables.   Notify your doctor if there is any change in your medical condition (cold, fever, infection).  Wear comfortable clothing (specific to your surgery type) to the hospital.  After surgery, you can help prevent lung complications by doing breathing exercises.  Take deep breaths and cough every 1-2 hours. Your doctor may order a device  called an Facilities manager to help you take deep breaths. When coughing or sneezing, hold a pillow firmly against your incision with both hands. This is called "splinting." Doing this helps protect your incision. It also decreases belly discomfort.  If you are being admitted to the hospital overnight, leave your suitcase in the car. After  surgery it may be brought to your room.  In case of increased patient census, it may be necessary for you, the patient, to continue your postoperative care in the Same Day Surgery department.  If you are being discharged the day of surgery, you will not be allowed to drive home. You will need a responsible individual to drive you home and stay with you for 24 hours after surgery.   If you are taking public transportation, you will need to have a responsible individual with you.  Please call the Pre-admissions Testing Dept. at [phone] if you have any questions about these instructions.  Surgery Visitation Policy:  Patients having surgery or a procedure may have two visitors.  Children under the age of 64 must have an adult with them who is not the patient.  Temporary Visitor Restrictions Due to increasing cases of flu, RSV and COVID-19: Children ages 9 and under will not be able to visit patients in [facility] hospitals under most circumstances.  Inpatient Visitation:    Visiting hours are 7 a.m. to 8 p.m. Up to four visitors are allowed at one time in a patient room. The visitors may rotate out with other people during the day.  One visitor age 45 or older may stay with the patient overnight and must be in the room by 8 p.m.

## 2023-03-03 NOTE — Telephone Encounter (Signed)
 Patient spoke with PAT team today and advised he was cancelling his surgery. I then called patient, reports that he came by [facility] and told someone there that he was cancelling his surgery. I hadn't been made aware of that. Patient's surgery cancelled with central scheduling and follow up appointments. Patient states that he has a lot going on at the moment and would like to postpone til this fits with his schedule better. He will call us back to reschedule.

## 2023-03-07 ENCOUNTER — Other Ambulatory Visit: Payer: Self-pay

## 2023-03-07 DIAGNOSIS — E1165 Type 2 diabetes mellitus with hyperglycemia: Secondary | ICD-10-CM

## 2023-03-10 ENCOUNTER — Other Ambulatory Visit: Payer: Self-pay

## 2023-03-10 DIAGNOSIS — J449 Chronic obstructive pulmonary disease, unspecified: Secondary | ICD-10-CM | POA: Diagnosis not present

## 2023-03-10 DIAGNOSIS — E1165 Type 2 diabetes mellitus with hyperglycemia: Secondary | ICD-10-CM

## 2023-03-10 MED ORDER — TRULICITY 0.75 MG/0.5ML ~~LOC~~ SOAJ: SUBCUTANEOUS | 11 refills | Status: AC

## 2023-03-11 ENCOUNTER — Ambulatory Visit: Admit: 2023-03-11 | Payer: Medicare HMO | Admitting: Urology

## 2023-03-11 SURGERY — CYSTOSCOPY WITH RETROGRADE URETHROGRAM
Anesthesia: General

## 2023-03-14 ENCOUNTER — Encounter: Payer: Medicare HMO | Admitting: Urology

## 2023-03-15 ENCOUNTER — Ambulatory Visit (INDEPENDENT_AMBULATORY_CARE_PROVIDER_SITE_OTHER): Payer: Medicare HMO | Admitting: Family

## 2023-03-15 ENCOUNTER — Encounter: Payer: Self-pay | Admitting: Family

## 2023-03-15 VITALS — BP 108/66 | HR 65 | Ht 70.0 in | Wt 186.0 lb

## 2023-03-15 DIAGNOSIS — J449 Chronic obstructive pulmonary disease, unspecified: Secondary | ICD-10-CM

## 2023-03-15 DIAGNOSIS — E782 Mixed hyperlipidemia: Secondary | ICD-10-CM | POA: Diagnosis not present

## 2023-03-15 DIAGNOSIS — D696 Thrombocytopenia, unspecified: Secondary | ICD-10-CM

## 2023-03-15 DIAGNOSIS — R799 Abnormal finding of blood chemistry, unspecified: Secondary | ICD-10-CM | POA: Diagnosis not present

## 2023-03-15 DIAGNOSIS — I10 Essential (primary) hypertension: Secondary | ICD-10-CM | POA: Insufficient documentation

## 2023-03-15 DIAGNOSIS — E1100 Type 2 diabetes mellitus with hyperosmolarity without nonketotic hyperglycemic-hyperosmolar coma (NKHHC): Secondary | ICD-10-CM

## 2023-03-15 MED ORDER — BD PEN NEEDLE SHORT U/F 31G X 8 MM MISC
1.0000 | Freq: Every day | 12 refills | Status: AC
Start: 2023-03-15 — End: ?

## 2023-03-15 MED ORDER — IPRATROPIUM-ALBUTEROL 0.5-2.5 (3) MG/3ML IN SOLN: 3.0000 mL | Freq: Four times a day (QID) | RESPIRATORY_TRACT | 5 refills | Status: DC | PRN

## 2023-03-15 NOTE — Assessment & Plan Note (Signed)
 Patient stable.  Well controlled with current therapy.   Continue current meds.

## 2023-03-15 NOTE — Progress Notes (Signed)
 Established Patient Office Visit  Subjective:  Patient ID: Jose Carroll, male    DOB: 1965-11-12  Age: 58 y.o. MRN: [id number]  Chief Complaint  Patient presents with   Follow-up    3 month follow up    Patient is here today for his 3 months follow up.  He has been feeling fairly well since last appointment.   He does have additional concerns to discuss today.  He has been using duoneb but has not been using the [place]. Asks if we can send him an RX.   Labs were done previously, so we will review these in detail today. WBC were elevated, which is apparently a long standing problem for him (that they elevated, then return to normals) that has been investigated and no cause found.  He also had a large decrease in his kidney function, so we will recheck his CMP and CBC today.  He needs refills.   I have reviewed his active problem list, medication list, allergies, notes from last encounter, lab results for his appointment today.      No other concerns at this time.   Past Medical History:  Diagnosis Date   Anginal pain (HCC)    Arthritis    Chronic pain    Chronic, continuous use of opioids    CKD (chronic kidney disease)    Claudication of both lower extremities (HCC)    COPD (chronic obstructive pulmonary disease) (HCC)    Coronary artery disease    a.) LHC 09/10/2014: 50% mLCx, 50% mLAD, 100% p-m RCA; good collaterals from LAD; med mgmt. b.) LHC 06/26/2015: 100% mRCA, 30% dLCx, 40% dLAD; good collaterals from LAD/LCx; med mgmt. c.) LHC 10/28/2017: EF 60%; 100% mRCA, 95% m-dLCx, 30% dLCx; PCI placing a 2.5 x 15 mm Resolute Onyx DES x1 to mLCx. Chronic RCA occlusion with L-R collaterals.   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, unspecified stricture type    T2DM (type 2 diabetes mellitus) (HCC)    Urine retention    Vitamin D deficiency     Past Surgical History:  Procedure Laterality Date   BACK SURGERY     4   CARDIAC CATHETERIZATION N/A 09/10/2014   Procedure: Left Heart Cath and Coronary Angiogram ;  Surgeon: Laurier Nancy, MD;  Location: [facility] INVASIVE CV LAB;  Service: Cardiovascular;  Laterality: N/A;   CARDIAC CATHETERIZATION Left 06/26/2015   Procedure: Left Heart Cath and Coronary Angiography;  Surgeon: Laurier Nancy, MD;  Location: [facility] INVASIVE CV LAB;  Service: Cardiovascular;  Laterality: Left;   CORONARY STENT INTERVENTION N/A 10/28/2017   Procedure: CORONARY STENT INTERVENTION (2.5 x 15 mm Resolute Onyx DES x 1 to mLCx);  Surgeon: Iran Ouch, MD;  Location: [facility] INVASIVE CV LAB;  Service: Cardiovascular;  Laterality: N/A;   FETAL SURGERY FOR CONGENITAL HERNIA  2012   FINGER SURGERY     sixth finger removed   HERNIA REPAIR     LEFT HEART CATH AND CORONARY  ANGIOGRAPHY Left 10/28/2017   Procedure: LEFT HEART CATH AND CORONARY ANGIOGRAPHY;  Surgeon: Laurier Nancy, MD;  Location: [facility] INVASIVE CV LAB;  Service: Cardiovascular;  Laterality: Left;   PULSE GENERATOR IMPLANT N/A 12/29/2020   Procedure: PULSE GENERATOR IMPLANT (MEDTRONIC);  Surgeon: Lucy Chris, MD;  Location: [facility] ORS;  Service: Neurosurgery;  Laterality: N/A;   SUBCLAVIAN ARTERY STENT     THORACIC LAMINECTOMY FOR SPINAL CORD STIMULATOR N/A 12/22/2020   Procedure: THORACIC LAMINECTOMY FOR SPINAL CORD STIMULATOR PADDLE TRIAL (MEDTRONIC);  Surgeon: Lucy Chris, MD;  Location: [facility] ORS;  Service: Neurosurgery;  Laterality: N/A;   TONSILLECTOMY      Social History   Socioeconomic History   Marital  status: Married    Spouse name: Not on file   Number of children: Not on file   Years of education: Not on file   Highest education level: Not on file  Occupational History   Not on file  Tobacco Use   Smoking status: Every Day    Current packs/day: 1.00    Average packs/day: 1 pack/day for 30.2 years (30.2 ttl pk-yrs)    Types: Cigarettes    Start date: 07/04/1990    Last attempt to quit: 07/04/2015   Smokeless tobacco: Former    Quit date: 07/01/2015  Vaping Use   Vaping status: Former   Substances: Nicotine   Devices: zero nicotine  Substance and Sexual Activity   Alcohol use: No    Alcohol/week: 0.0 standard drinks of alcohol   Drug use: No   Sexual activity: Not on file  Other Topics Concern   Not on file  Social History Narrative   Not on file   Social Drivers of Health   Financial Resource Strain: Not on file  Food Insecurity: Not on file  Transportation Needs: Not on file  Physical Activity: Not on file  Stress: Not on file  Social Connections: Not on file  Intimate Partner Violence: Not on file    Family History  Problem Relation Age of Onset   Cancer Mother    Diabetes Mother    Anuerysm Father     Allergies  Allergen Reactions   Naproxen Rash    Review of Systems  All other systems reviewed and are negative.      Objective:   BP 108/66   Pulse 65   Ht 5\' 10"  (1.778 m)   Wt 186 lb (84.4 kg)   SpO2 97%   BMI 26.69 kg/m   Vitals:   03/15/23 1317  BP: 108/66  Pulse: 65  Height: 5\' 10"  (1.778 m)  Weight: 186 lb (84.4 kg)  SpO2: 97%  BMI (Calculated): 26.69    Physical Exam Vitals and nursing note reviewed.  Constitutional:      Appearance: Normal appearance. He is normal weight.  Eyes:     Extraocular Movements: Extraocular movements intact.     Conjunctiva/sclera: Conjunctivae normal.     Pupils: Pupils are equal, round, and reactive to light.  Cardiovascular:     Rate and Rhythm: Normal rate and regular rhythm.     Pulses:  Normal pulses.     Heart sounds: Normal heart sounds.  Pulmonary:     Effort: Pulmonary effort is normal.     Breath sounds: Normal breath sounds.  Neurological:     General: No focal deficit present.     Mental Status: He is alert and oriented to person, place, and time. Mental status is at baseline.  Psychiatric:  Mood and Affect: Mood normal.        Behavior: Behavior normal.        Thought Content: Thought content normal.        Judgment: Judgment normal.      No results found for any visits on 03/15/23.  Recent Results (from the past 2160 hours)  BLADDER SCAN AMB NON-IMAGING     Status: None   Collection Time: 01/11/23  3:01 PM  Result Value Ref Range   Scan Result   Urine Culture     Status: None   Collection Time: 01/11/23  3:42 PM   Specimen: Urine, Random  Result Value Ref Range   Specimen Description      URINE, RANDOM Performed at [facility], [address] [place], [place] [address]    Special Requests      NONE Performed at [facility], [address], [place], [place] [address]    Culture      NO GROWTH Performed at [facility], [address] [place], [place] [address]    Report Status 01/13/2023 FINAL   Urinalysis, Complete w Microscopic -     Status: Abnormal   Collection Time: 01/11/23  3:42 PM  Result Value Ref Range   Color, Urine YELLOW YELLOW   APPearance HAZY (A) CLEAR   Specific Gravity, Urine 1.010 1.005 - 1.030   pH 6.0 5.0 - 8.0   Glucose, UA >=500 (A) NEGATIVE mg/dL   Hgb urine dipstick TRACE (A) NEGATIVE   Bilirubin Urine NEGATIVE NEGATIVE   Ketones, ur NEGATIVE NEGATIVE mg/dL   Protein, ur NEGATIVE NEGATIVE mg/dL   Nitrite NEGATIVE NEGATIVE   Leukocytes,Ua SMALL (A) NEGATIVE   Squamous Epithelial / HPF 0-5 0 - 5 /HPF   WBC, UA >50 0 - 5 WBC/hpf   RBC / HPF 0-5 0 - 5 RBC/hpf   Bacteria, UA MANY (A) NONE SEEN   WBC Clumps PRESENT    Budding Yeast PRESENT    Hyphae Yeast PRESENT      Comment: Performed at [facility], [address] [place], [place] [address]  Miscellaneous [facility] test (send-out)     Status: None   Collection Time: 01/11/23  3:42 PM  Result Value Ref Range   [facility] test code [id number]    [facility] test name UREAPLASMA/MYCOPLASMA     Comment: Performed at [facility], [address] [place], [place] [address]   Misc [facility] result COMMENT     Comment: (NOTE) Test Ordered: [id number] Ureaplasma/Mycoplasma hominis Ureaplasma urealyticum         Negative                  BN     Reference Range: Negative                              Mycoplasma hominis             Negative                  BN     Reference Range: Negative                              Performed At: [facility] [address] [place], [place] [id number] Jolene Schimke MD DG:[id number]   Testosterone     Status: None  Collection Time: 01/11/23  3:49 PM  Result Value Ref Range   Testosterone 568 264 - 916 ng/dL    Comment: (NOTE) Adult male reference interval is based on a population of healthy nonobese males (BMI <30) between 31 and 76 years old. Travison, et.al. JCEM [phone]. PMID: [id number]. Performed At: [facility] [address] [place], [place] [id number] Jolene Schimke MD QI:[id number]   CMP14+EGFR     Status: Abnormal   Collection Time: 02/28/23 12:05 PM  Result Value Ref Range   Glucose 107 (H) 70 - 99 mg/dL   BUN 13 6 - 24 mg/dL   Creatinine, Ser 3.24 (H) 0.76 - 1.27 mg/dL   eGFR 49 (L) >40 NU/UVO/5.36   BUN/Creatinine Ratio 8 (L) 9 - 20   Sodium 143 134 - 144 mmol/L   Potassium 3.1 (L) 3.5 - 5.2 mmol/L   Chloride 101 96 - 106 mmol/L   CO2 28 20 - 29 mmol/L   Calcium 9.2 8.7 - 10.2 mg/dL   Total Protein 6.5 6.0 - 8.5 g/dL   Albumin 3.9 3.8 - 4.9 g/dL   Globulin, Total 2.6 1.5 - 4.5 g/dL   Bilirubin Total 0.4 0.0 - 1.2 mg/dL   Alkaline Phosphatase 171 (H) 44 - 121 IU/L   AST 20 0 - 40 IU/L   ALT 26 0 - 44  IU/L  CBC with Differential/Platelet     Status: Abnormal   Collection Time: 02/28/23 12:05 PM  Result Value Ref Range   WBC 11.0 (H) 3.4 - 10.8 x10E3/uL   RBC 4.96 4.14 - 5.80 x10E6/uL   Hemoglobin 14.7 13.0 - 17.7 g/dL   Hematocrit 64.4 03.4 - 51.0 %   MCV 90 79 - 97 fL   MCH 29.6 26.6 - 33.0 pg   MCHC 33.0 31.5 - 35.7 g/dL   RDW 74.2 59.5 - 63.8 %   Platelets 158 150 - 450 x10E3/uL   Neutrophils 72 Not Estab. %   Lymphs 19 Not Estab. %   Monocytes 6 Not Estab. %   Eos 2 Not Estab. %   Basos 1 Not Estab. %   Neutrophils Absolute 7.9 (H) 1.4 - 7.0 x10E3/uL   Lymphocytes Absolute 2.1 0.7 - 3.1 x10E3/uL   Monocytes Absolute 0.6 0.1 - 0.9 x10E3/uL   EOS (ABSOLUTE) 0.2 0.0 - 0.4 x10E3/uL   Basophils Absolute 0.1 0.0 - 0.2 x10E3/uL   Immature Granulocytes 0 Not Estab. %   Immature Grans (Abs) 0.0 0.0 - 0.1 x10E3/uL  Lipid panel     Status: Abnormal   Collection Time: 02/28/23 12:05 PM  Result Value Ref Range   Cholesterol, Total 87 (L) 100 - 199 mg/dL   Triglycerides 756 (H) 0 - 149 mg/dL   HDL 31 (L) >43 mg/dL   VLDL Cholesterol Cal 37 5 - 40 mg/dL   LDL Chol Calc (NIH) 19 0 - 99 mg/dL   Chol/HDL Ratio 2.8 0.0 - 5.0 ratio    Comment:                                   T. Chol/HDL Ratio                                             Men  Women  1/2 Avg.Risk  3.4    3.3                                   Avg.Risk  5.0    4.4                                2X Avg.Risk  9.6    7.1                                3X Avg.Risk 23.4   11.0   Hemoglobin A1c     Status: Abnormal   Collection Time: 02/28/23 12:05 PM  Result Value Ref Range   Hgb A1c MFr Bld 6.7 (H) 4.8 - 5.6 %    Comment:          Prediabetes: 5.7 - 6.4          Diabetes: >6.4          Glycemic control for adults with diabetes: <7.0    Est. average glucose Bld gHb Est-mCnc 146 mg/dL  Urine culture     Status: None   Collection Time: 03/01/23  2:51 PM   Specimen: Urine, Random  Result  Value Ref Range   Specimen Description      URINE, RANDOM Performed at [facility], [address] [place], [place] [address]    Special Requests      NONE Performed at [facility], [address], [place], [place] [address]    Culture      NO GROWTH Performed at [facility], [address], [place], [place] [address]    Report Status 03/02/2023 FINAL   Urinalysis, Complete w Microscopic -     Status: Abnormal   Collection Time: 03/01/23  2:51 PM  Result Value Ref Range   Color, Urine YELLOW YELLOW   APPearance CLEAR CLEAR   Specific Gravity, Urine 1.015 1.005 - 1.030   pH 7.0 5.0 - 8.0   Glucose, UA 250 (A) NEGATIVE mg/dL   Hgb urine dipstick TRACE (A) NEGATIVE   Bilirubin Urine NEGATIVE NEGATIVE   Ketones, ur NEGATIVE NEGATIVE mg/dL   Protein, ur NEGATIVE NEGATIVE mg/dL   Nitrite NEGATIVE NEGATIVE   Leukocytes,Ua LARGE (A) NEGATIVE   Squamous Epithelial / HPF 0-5 0 - 5 /HPF   WBC, UA >50 0 - 5 WBC/hpf   RBC / HPF 11-20 0 - 5 RBC/hpf   Bacteria, UA FEW (A) NONE SEEN   Budding Yeast PRESENT    Ca Oxalate Crys, UA PRESENT     Comment: Performed at  [facility], [address] [place], [place] [address]       Assessment & Plan:   Problem List Items Addressed This Visit       Cardiovascular and Mediastinum   Essential hypertension   Patient stable.  Well controlled with current therapy.   Continue current meds.          Respiratory   COPD (chronic obstructive pulmonary disease) (HCC)   Patient stable.  Well controlled with current therapy.   Continue current meds.        Relevant Medications   ipratropium-albuterol (DUONEB) 0.5-2.5 (3) MG/3ML SOLN     Endocrine   Type II diabetes mellitus with hyperosmolarity, uncontrolled (HCC)  A1C is improved over previous.  Patient has refills for his Evaristo Bury and his Trulicity, will send pen needle refills for him as well.   Recheck labs prior to next appointment.   Will adjust as needed at that time.         Hematopoietic and Hemostatic   Thrombocytopenia (HCC)   Patient stable.  Well controlled with current therapy.   Continue current meds.          Other   Mixed hyperlipidemia   Patient stable.  Well controlled with current therapy.   Continue current meds.        Other Visit Diagnoses       Abnormal blood chemistry    -  Primary   Rechecking CMP and CBC today.  Will call patient with results.   Relevant Orders   CMP14+EGFR   CBC with Diff       Return in about 4 months (around 07/15/2023).   Total time spent: 20 minutes  Miki Kins, FNP  03/15/2023   This document may have been prepared by [facility] Voice Recognition software and as such may include unintentional dictation errors.

## 2023-03-15 NOTE — Assessment & Plan Note (Addendum)
 A1C is improved over previous.  Patient has refills for his Tresiba  and his Trulicity, will send pen needle refills for him as well.  Patient using CGM, and is benefiting from use as is evidenced by the recent decrease in his A1C   Recheck labs prior to next appointment.  Will adjust as needed at that time.

## 2023-03-16 LAB — CBC WITH DIFFERENTIAL/PLATELET
Basophils Absolute: 0.1 10*3/uL (ref 0.0–0.2)
Basos: 1 %
EOS (ABSOLUTE): 0.2 10*3/uL (ref 0.0–0.4)
Eos: 2 %
Hematocrit: 43.8 % (ref 37.5–51.0)
Hemoglobin: 14.4 g/dL (ref 13.0–17.7)
Immature Grans (Abs): 0 10*3/uL (ref 0.0–0.1)
Immature Granulocytes: 0 %
Lymphocytes Absolute: 1.8 10*3/uL (ref 0.7–3.1)
Lymphs: 19 %
MCH: 29.3 pg (ref 26.6–33.0)
MCHC: 32.9 g/dL (ref 31.5–35.7)
MCV: 89 fL (ref 79–97)
Monocytes Absolute: 0.5 10*3/uL (ref 0.1–0.9)
Monocytes: 6 %
Neutrophils Absolute: 6.8 10*3/uL (ref 1.4–7.0)
Neutrophils: 72 %
Platelets: 158 10*3/uL (ref 150–450)
RBC: 4.92 x10E6/uL (ref 4.14–5.80)
RDW: 13.8 % (ref 11.6–15.4)
WBC: 9.4 10*3/uL (ref 3.4–10.8)

## 2023-03-16 LAB — CMP14+EGFR
ALT: 29 IU/L (ref 0–44)
AST: 27 IU/L (ref 0–40)
Albumin: 3.9 g/dL (ref 3.8–4.9)
Alkaline Phosphatase: 173 IU/L — ABNORMAL HIGH (ref 44–121)
BUN/Creatinine Ratio: 8 — ABNORMAL LOW (ref 9–20)
BUN: 11 mg/dL (ref 6–24)
Bilirubin Total: 0.4 mg/dL (ref 0.0–1.2)
CO2: 23 mmol/L (ref 20–29)
Calcium: 9.3 mg/dL (ref 8.7–10.2)
Chloride: 101 mmol/L (ref 96–106)
Creatinine, Ser: 1.35 mg/dL — ABNORMAL HIGH (ref 0.76–1.27)
Globulin, Total: 2.7 g/dL (ref 1.5–4.5)
Glucose: 194 mg/dL — ABNORMAL HIGH (ref 70–99)
Potassium: 3.5 mmol/L (ref 3.5–5.2)
Sodium: 143 mmol/L (ref 134–144)
Total Protein: 6.6 g/dL (ref 6.0–8.5)
eGFR: 62 mL/min/{1.73_m2} (ref >59)

## 2023-03-16 NOTE — Progress Notes (Signed)
 MRN : [id number]  Jose Carroll is a 58 y.o. (12/03/1965) male who presents with chief complaint of check circulation.  History of Present Illness:  The patient returns to the office for followup and review of the noninvasive studies.    There have been no interval changes in lower extremity symptoms. No interval shortening of the patient's claudication distance or development of rest pain symptoms. No new ulcers or wounds have occurred since the last visit.   There have been no significant changes to the patient's overall health care.   He has an history of extensive back problems and DJD of the lumbar sacral spine.  He has had multiple surgeries.  He has had fusions as well as pain stimulators placed. He has a history of chronic pain and on 12/29/2020 had implantation of SCS pulse generator.    The patient denies amaurosis fugax or recent TIA symptoms. There are no documented recent neurological changes noted. There is no history of DVT, PE or superficial thrombophlebitis. The patient denies recent episodes of angina or shortness of breath.    ABI Rt=0.68 (biphasic) and Lt=0.97 (biphasic)  (previous ABI Rt=0.97 (monophasic) and Lt=1.14 (biphasic))   No outpatient medications have been marked as taking for the 03/21/23 encounter (Appointment) with Gilda Crease, Latina Craver, MD.    Past Medical History:  Diagnosis Date   Anginal pain (HCC)    Arthritis    Chronic pain    Chronic, continuous use of opioids    CKD (chronic kidney disease)    Claudication of both lower extremities (HCC)    COPD (chronic obstructive pulmonary disease) (HCC)    Coronary artery disease    a.) LHC 09/10/2014: 50% mLCx, 50% mLAD, 100% p-m RCA; good collaterals from LAD; med mgmt. b.) LHC 06/26/2015: 100% mRCA, 30% dLCx, 40% dLAD; good collaterals from LAD/LCx; med mgmt. c.) LHC 10/28/2017: EF 60%; 100% mRCA, 95% m-dLCx, 30% dLCx; PCI  placing a 2.5 x 15 mm Resolute Onyx DES x1 to mLCx. Chronic RCA occlusion with L-R collaterals.   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, unspecified stricture type    T2DM (type 2 diabetes mellitus) (HCC)    Urine retention    Vitamin D deficiency     Past Surgical History:  Procedure Laterality Date   BACK SURGERY     4   CARDIAC CATHETERIZATION N/A 09/10/2014   Procedure: Left Heart Cath and Coronary Angiogram ;  Surgeon: Laurier Nancy, MD;  Location: [facility] INVASIVE CV LAB;  Service: Cardiovascular;  Laterality: N/A;   CARDIAC CATHETERIZATION Left 06/26/2015   Procedure: Left Heart  Cath and Coronary Angiography;  Surgeon: Laurier Nancy, MD;  Location[facility] INVASIVE CV LAB;  Service: Cardiovascular;  Laterality: Left;   CORONARY STENT INTERVENTION N/A 10/28/2017   Procedure: CORONARY STENT INTERVENTION (2.5 x 15 mm Resolute Onyx DES x 1 to mLCx);  Surgeon: Iran Ouch, MD;  Location: [facility] INVASIVE CV LAB;  Service: Cardiovascular;  Laterality: N/A;   FETAL SURGERY FOR CONGENITAL HERNIA  2012   FINGER SURGERY     sixth finger removed   HERNIA REPAIR     LEFT HEART CATH AND CORONARY ANGIOGRAPHY Left 10/28/2017   Procedure: LEFT HEART CATH AND CORONARY ANGIOGRAPHY;  Surgeon: Laurier Nancy, MD;  Location: [facility] INVASIVE CV LAB;  Service: Cardiovascular;  Laterality: Left;   PULSE GENERATOR IMPLANT N/A 12/29/2020   Procedure: PULSE GENERATOR IMPLANT (MEDTRONIC);  Surgeon: Lucy Chris, MD;  Location: [facility] ORS;  Service:  Neurosurgery;  Laterality: N/A;   SUBCLAVIAN ARTERY STENT     THORACIC LAMINECTOMY FOR SPINAL CORD STIMULATOR N/A 12/22/2020   Procedure: THORACIC LAMINECTOMY FOR SPINAL CORD STIMULATOR PADDLE TRIAL (MEDTRONIC);  Surgeon: Lucy Chris, MD;  Location: [facility] ORS;  Service: Neurosurgery;  Laterality: N/A;   TONSILLECTOMY      Social History Social History   Tobacco Use   Smoking status: Every Day    Current packs/day: 1.00    Average packs/day: 1 pack/day for 30.2 years (30.2 ttl pk-yrs)    Types: Cigarettes    Start date: 07/04/1990    Last attempt to quit: 07/04/2015   Smokeless tobacco: Former    Quit date: 07/01/2015  Vaping Use   Vaping status: Former   Substances: Nicotine   Devices: zero nicotine  Substance Use Topics   Alcohol use: No    Alcohol/week: 0.0 standard drinks of alcohol   Drug use: No    Family History Family History  Problem Relation Age of Onset   Cancer Mother    Diabetes Mother    Anuerysm Father     Allergies  Allergen Reactions   Naproxen Rash     REVIEW OF SYSTEMS (Negative unless checked)  Constitutional: [] Weight loss  [] Fever  [] Chills Cardiac: [] Chest pain   [] Chest pressure   [] Palpitations   [] Shortness of breath when laying flat   [] Shortness of breath with exertion. Vascular:  [x] Pain in legs with walking   [] Pain in legs at rest  [] History of DVT   [] Phlebitis   [] Swelling in legs   [] Varicose veins   [] Non-healing ulcers Pulmonary:   [] Uses home oxygen   [] Productive cough   [] Hemoptysis   [] Wheeze  [] COPD   [] Asthma Neurologic:  [] Dizziness   [] Seizures   [] History of stroke   [] History of TIA  [] Aphasia   [] Vissual changes   [] Weakness or numbness in arm   [] Weakness or numbness in leg Musculoskeletal:   [] Joint swelling   [] Joint pain   [] Low back pain Hematologic:  [] Easy bruising  [] Easy bleeding   [] Hypercoagulable state   [] Anemic Gastrointestinal:  [] Diarrhea   [] Vomiting  [] Gastroesophageal reflux/heartburn   [] Difficulty  swallowing. Genitourinary:  [] Chronic kidney disease   [] Difficult urination  [] Frequent urination   [] Blood in urine Skin:  [] Rashes   [] Ulcers  Psychological:  [] History of anxiety   []  History of major depression.  Physical Examination  There were no vitals filed for this visit. There is no height or weight on file to calculate BMI. Gen: WD/WN, NAD Head: [place]/AT, No temporalis wasting.  Ear/Nose/Throat: Hearing grossly intact, nares  w/o erythema or drainage Eyes: PER, EOMI, sclera nonicteric.  Neck: Supple, no masses.  No bruit or JVD.  Pulmonary:  Good air movement, no audible wheezing, no use of accessory muscles.  Cardiac: RRR, normal S1, S2, no Murmurs. Vascular:  mild trophic changes, no open wounds Vessel Right Left  Radial Palpable Palpable  PT Not Palpable Not Palpable  DP Not Palpable Not Palpable  Gastrointestinal: soft, non-distended. No guarding/no peritoneal signs.  Musculoskeletal: M/S 5/5 throughout.  No visible deformity.  Neurologic: CN 2-12 intact. Pain and light touch intact in extremities.  Symmetrical.  Speech is fluent. Motor exam as listed above. Psychiatric: Judgment intact, Mood & affect appropriate for pt's clinical situation. Dermatologic: No rashes or ulcers noted.  No changes consistent with cellulitis.   CBC Lab Results  Component Value Date   WBC 9.4 03/15/2023   HGB 14.4 03/15/2023   HCT 43.8 03/15/2023   MCV 89 03/15/2023   PLT 158 03/15/2023    BMET    Component Value Date/Time   NA 143 03/15/2023 1403   NA 136 05/05/2011 2221   K 3.5 03/15/2023 1403   K 3.8 05/05/2011 2221   CL 101 03/15/2023 1403   CL 101 05/05/2011 2221   CO2 23 03/15/2023 1403   CO2 24 05/05/2011 2221   GLUCOSE 194 (H) 03/15/2023 1403   GLUCOSE 112 (H) 12/22/2020 1311   GLUCOSE 175 (H) 05/05/2011 2221   BUN 11 03/15/2023 1403   BUN 4 (L) 05/05/2011 2221   CREATININE 1.35 (H) 03/15/2023 1403   CREATININE 1.12 05/05/2011 2221   CALCIUM 9.3 03/15/2023 1403    CALCIUM 8.9 05/05/2011 2221   GFRNONAA >60 12/22/2020 1311   GFRNONAA >60 05/05/2011 2221   GFRAA >60 10/29/2017 0333   GFRAA >60 05/05/2011 2221   Estimated Creatinine Clearance: 63.8 mL/min (A) (by C-G formula based on SCr of 1.35 mg/dL (H)).  COAG Lab Results  Component Value Date   INR 1.0 12/22/2020   INR 0.89 02/07/2013   INR 0.9 05/05/2011    Radiology No results found.   Assessment/Plan 1. Atherosclerosis of native artery of both lower extremities with intermittent claudication (HCC) (Primary)  Recommend:  The patient has evidence of atherosclerosis of the lower extremities with claudication.  The patient does not voice lifestyle limiting changes at this point in time.  Noninvasive studies do not suggest clinically significant change.  No invasive studies, angiography or surgery at this time The patient should continue walking and begin a more formal exercise program.  The patient should continue antiplatelet therapy and aggressive treatment of the lipid abnormalities  No changes in the patient's medications at this time  Continued surveillance is indicated as atherosclerosis is likely to progress with time.    The patient will continue follow up with noninvasive studies as ordered.  - VAS [place] ABI WITH/WO TBI; Future  2. Coronary artery disease of native artery of native heart with stable angina pectoris (HCC) Continue cardiac and antihypertensive medications as already ordered and reviewed, no changes at this time.  Continue statin as ordered and reviewed, no changes at this time  Nitrates PRN for chest pain  3. Essential hypertension Continue antihypertensive medications as already ordered, these medications have been reviewed and there are no changes at this time.  4. Chronic obstructive pulmonary disease, unspecified COPD type (HCC) Continue pulmonary medications and aerosols as already ordered, these medications have been reviewed and there are no changes  at this time.   5. Type II  diabetes mellitus with hyperosmolarity, uncontrolled (HCC) Continue hypoglycemic medications as already ordered, these medications have been reviewed and there are no changes at this time.  Hgb A1C to be monitored as already arranged by primary service    Levora Dredge, MD  03/16/2023 1:23 PM

## 2023-03-18 ENCOUNTER — Other Ambulatory Visit: Payer: Self-pay | Admitting: Family

## 2023-03-18 MED ORDER — GLIPIZIDE ER 5 MG PO TB24: 5.0000 mg | ORAL_TABLET | Freq: Two times a day (BID) | ORAL | 3 refills | Status: AC

## 2023-03-21 ENCOUNTER — Ambulatory Visit (INDEPENDENT_AMBULATORY_CARE_PROVIDER_SITE_OTHER): Payer: Medicare HMO | Admitting: Vascular Surgery

## 2023-03-21 ENCOUNTER — Encounter (INDEPENDENT_AMBULATORY_CARE_PROVIDER_SITE_OTHER): Payer: Self-pay | Admitting: Vascular Surgery

## 2023-03-21 ENCOUNTER — Ambulatory Visit (INDEPENDENT_AMBULATORY_CARE_PROVIDER_SITE_OTHER): Payer: Medicare HMO

## 2023-03-21 VITALS — BP 124/77 | HR 79 | Resp 18 | Ht 70.0 in | Wt 198.0 lb

## 2023-03-21 DIAGNOSIS — J449 Chronic obstructive pulmonary disease, unspecified: Secondary | ICD-10-CM

## 2023-03-21 DIAGNOSIS — I10 Essential (primary) hypertension: Secondary | ICD-10-CM | POA: Diagnosis not present

## 2023-03-21 DIAGNOSIS — E1100 Type 2 diabetes mellitus with hyperosmolarity without nonketotic hyperglycemic-hyperosmolar coma (NKHHC): Secondary | ICD-10-CM

## 2023-03-21 DIAGNOSIS — I70213 Atherosclerosis of native arteries of extremities with intermittent claudication, bilateral legs: Secondary | ICD-10-CM

## 2023-03-21 DIAGNOSIS — I25118 Atherosclerotic heart disease of native coronary artery with other forms of angina pectoris: Secondary | ICD-10-CM | POA: Diagnosis not present

## 2023-03-21 LAB — VAS US ABI WITH/WO TBI
Left ABI: 0.97
Right ABI: 0.68

## 2023-03-30 DIAGNOSIS — M79662 Pain in left lower leg: Secondary | ICD-10-CM | POA: Diagnosis not present

## 2023-03-30 DIAGNOSIS — M545 Low back pain, unspecified: Secondary | ICD-10-CM | POA: Diagnosis not present

## 2023-03-30 DIAGNOSIS — M79661 Pain in right lower leg: Secondary | ICD-10-CM | POA: Diagnosis not present

## 2023-04-12 ENCOUNTER — Other Ambulatory Visit: Payer: Self-pay | Admitting: Family

## 2023-04-14 ENCOUNTER — Other Ambulatory Visit: Payer: Self-pay

## 2023-04-14 DIAGNOSIS — E1100 Type 2 diabetes mellitus with hyperosmolarity without nonketotic hyperglycemic-hyperosmolar coma (NKHHC): Secondary | ICD-10-CM

## 2023-04-15 MED ORDER — TRESIBA FLEXTOUCH 100 UNIT/ML ~~LOC~~ SOPN: 48.0000 [IU] | PEN_INJECTOR | Freq: Every day | SUBCUTANEOUS | 3 refills | Status: DC

## 2023-04-18 DIAGNOSIS — E119 Type 2 diabetes mellitus without complications: Secondary | ICD-10-CM | POA: Diagnosis not present

## 2023-05-04 ENCOUNTER — Other Ambulatory Visit: Payer: Self-pay | Admitting: Family

## 2023-05-13 ENCOUNTER — Other Ambulatory Visit: Payer: Self-pay

## 2023-05-13 ENCOUNTER — Emergency Department
Admission: EM | Admit: 2023-05-13 | Discharge: 2023-05-14 | Disposition: A | Discharge status: Home / Self Care | Attending: Emergency Medicine | Admitting: Emergency Medicine

## 2023-05-13 DIAGNOSIS — R531 Weakness: Secondary | ICD-10-CM | POA: Diagnosis not present

## 2023-05-13 DIAGNOSIS — I4510 Unspecified right bundle-branch block: Secondary | ICD-10-CM | POA: Diagnosis not present

## 2023-05-13 DIAGNOSIS — N390 Urinary tract infection, site not specified: Secondary | ICD-10-CM | POA: Diagnosis not present

## 2023-05-13 DIAGNOSIS — R1030 Lower abdominal pain, unspecified: Secondary | ICD-10-CM | POA: Diagnosis present

## 2023-05-13 DIAGNOSIS — E876 Hypokalemia: Secondary | ICD-10-CM | POA: Insufficient documentation

## 2023-05-13 DIAGNOSIS — R42 Dizziness and giddiness: Secondary | ICD-10-CM | POA: Insufficient documentation

## 2023-05-13 LAB — URINALYSIS, ROUTINE W REFLEX MICROSCOPIC
Bacteria, UA: NONE SEEN
Bilirubin Urine: NEGATIVE
Glucose, UA: >=500 mg/dL — AB
Hgb urine dipstick: NEGATIVE
Ketones, ur: NEGATIVE mg/dL
Nitrite: NEGATIVE
Protein, ur: NEGATIVE mg/dL
Specific Gravity, Urine: 1.016 (ref 1.005–1.030)
pH: 5 (ref 5.0–8.0)

## 2023-05-13 LAB — CBC
HCT: 43.2 % (ref 39.0–52.0)
Hemoglobin: 14.8 g/dL (ref 13.0–17.0)
MCH: 29.9 pg (ref 26.0–34.0)
MCHC: 34.3 g/dL (ref 30.0–36.0)
MCV: 87.3 fL (ref 80.0–100.0)
Platelets: 145 10*3/uL — ABNORMAL LOW (ref 150–400)
RBC: 4.95 MIL/uL (ref 4.22–5.81)
RDW: 13.9 % (ref 11.5–15.5)
WBC: 9.7 10*3/uL (ref 4.0–10.5)
nRBC: 0 % (ref 0.0–0.2)

## 2023-05-13 LAB — COMPREHENSIVE METABOLIC PANEL WITH GFR
ALT: 21 U/L (ref 0–44)
AST: 28 U/L (ref 15–41)
Albumin: 3.9 g/dL (ref 3.5–5.0)
Alkaline Phosphatase: 94 U/L (ref 38–126)
Anion gap: 12 (ref 5–15)
BUN: 15 mg/dL (ref 6–20)
CO2: 24 mmol/L (ref 22–32)
Calcium: 9 mg/dL (ref 8.9–10.3)
Chloride: 100 mmol/L (ref 98–111)
Creatinine, Ser: 1.39 mg/dL — ABNORMAL HIGH (ref 0.61–1.24)
GFR, Estimated: 60 mL/min — ABNORMAL LOW (ref >60)
Glucose, Bld: 134 mg/dL — ABNORMAL HIGH (ref 70–99)
Potassium: 2.4 mmol/L — CL (ref 3.5–5.1)
Sodium: 136 mmol/L (ref 135–145)
Total Bilirubin: 0.8 mg/dL (ref 0.0–1.2)
Total Protein: 7 g/dL (ref 6.5–8.1)

## 2023-05-13 LAB — TSH: TSH: 1.727 u[IU]/mL (ref 0.350–4.500)

## 2023-05-13 LAB — TROPONIN I (HIGH SENSITIVITY): Troponin I (High Sensitivity): 14 ng/L (ref <18)

## 2023-05-13 LAB — MAGNESIUM: Magnesium: 1.9 mg/dL (ref 1.7–2.4)

## 2023-05-13 MED ORDER — SODIUM CHLORIDE 0.9 % IV SOLN
1.0000 g | Freq: Once | INTRAVENOUS | Status: AC
  Administered 2023-05-13: 1 g via INTRAVENOUS
  Filled 2023-05-13: qty 10

## 2023-05-13 MED ORDER — CEPHALEXIN 500 MG PO CAPS: 500.0000 mg | ORAL_CAPSULE | Freq: Three times a day (TID) | ORAL | 0 refills | Status: DC

## 2023-05-13 MED ORDER — POTASSIUM CHLORIDE CRYS ER 20 MEQ PO TBCR
40.0000 meq | EXTENDED_RELEASE_TABLET | Freq: Once | ORAL | Status: AC
  Administered 2023-05-13: 40 meq via ORAL
  Filled 2023-05-13: qty 2

## 2023-05-13 MED ORDER — SODIUM CHLORIDE 0.9 % IV BOLUS
1000.0000 mL | Freq: Once | INTRAVENOUS | Status: AC
  Administered 2023-05-13: 1000 mL via INTRAVENOUS

## 2023-05-13 NOTE — ED Triage Notes (Signed)
 Pt to ED via [facility] from home c/o dizziness x4 days and weaknessx2 days. Pt reports has noticed BP fluctuations with dizziness but doesn't remember reading. Pt also says he has been feeling weaker than usual. Uses a cane to get around. Denies CP, SHOB, fevers

## 2023-05-13 NOTE — ED Provider Notes (Signed)
 [facility] Provider Note    Event Date/Time   First MD Initiated Contact with Patient 05/13/23 2117     (approximate)   History   Dizziness   HPI  Jose Carroll is a 58 y.o. male who presents to the emergency department today because of concerns for dizziness and weakness.  Symptoms have been present for the past couple of days.  He has had much less energy to get up.  He does think this could be related to his blood pressure which he says will vary wildly.  Patient additionally has noticed some suprapubic abdominal pain.  Initially has had decreased urine output even though he takes Lasix .  Patient has not had any fevers but has had chills.     Physical Exam   Triage Vital Signs: ED Triage Vitals  Encounter Vitals Group     BP 05/13/23 2024 (!) 144/81     Systolic BP Percentile --      Diastolic BP Percentile --      Pulse Rate 05/13/23 2024 75     Resp 05/13/23 2024 18     Temp 05/13/23 2024 98 F (36.7 C)     Temp Source 05/13/23 2024 Oral     SpO2 05/13/23 2024 99 %     Weight 05/13/23 2022 189 lb (85.7 kg)     Height 05/13/23 2022 5\' 10"  (1.778 m)     Head Circumference --      Peak Flow --      Pain Score 05/13/23 2022 0     Pain Loc --      Pain Education --      Exclude from Growth Chart --     Most recent vital signs: Vitals:   05/13/23 2115 05/13/23 2130  BP: (!) 155/83 (!) 144/89  Pulse: 71 65  Resp: 12 10  Temp:    SpO2: 100% 100%   General: Awake, alert, oriented. CV:  Good peripheral perfusion. Regular rate and rhythm. Resp:  Normal effort. Lungs clear. Abd:  No distention. Mild suprapubic tenderness  ED Results / Procedures / Treatments   Labs (all labs ordered are listed, but only abnormal results are displayed) Labs Reviewed  COMPREHENSIVE METABOLIC PANEL WITH GFR - Abnormal; Notable for the following components:      Result Value   Potassium 2.4 (*)    Glucose, Bld 134 (*)    Creatinine, Ser 1.39 (*)     GFR, Estimated 60 (*)    All other components within normal limits  CBC - Abnormal; Notable for the following components:   Platelets 145 (*)    All other components within normal limits  MAGNESIUM  URINALYSIS, ROUTINE W REFLEX MICROSCOPIC  TROPONIN I (HIGH SENSITIVITY)     EKG  I, Marylynn Soho, attending physician, personally viewed and interpreted this EKG  EKG Time: 2027 Rate: 77 Rhythm: normal sinus rhythm Axis: normal Intervals: qtc 479 QRS: Incomplete RBBB ST changes: no st elevation Impression: abnormal ekg  RADIOLOGY None   PROCEDURES:  Critical Care performed: No   MEDICATIONS ORDERED IN ED: Medications  potassium chloride  SA (KLOR-CON  M) CR tablet 40 mEq (40 mEq Oral Given 05/13/23 2140)     IMPRESSION / MDM / ASSESSMENT AND PLAN / ED COURSE  I reviewed the triage vital signs and the nursing notes.  Differential diagnosis includes, but is not limited to, dehydration, electrolyte abnormality, infection, anemia  Patient's presentation is most consistent with acute presentation with potential threat to life or bodily function.  Patient presented the emergency department today because of concerns for dizziness and weakness.  Blood work here does show hypokalemia.  Patient was given potassium.  No concerning leukocytosis or anemia.  UA is concerning for urinary tract infection.  This could also explain the patient's suprapubic pain.  Will give patient dose of IV antibiotics here in the emergency department.  Will plan on discharging with prescription for further antibiotics.     FINAL CLINICAL IMPRESSION(S) / ED DIAGNOSES   Final diagnoses:  Lower urinary tract infectious disease     Note:  This document was prepared using Dragon voice recognition software and may include unintentional dictation errors.'    Marylynn Soho, MD 05/13/23 2322

## 2023-05-15 LAB — URINE CULTURE: Culture: <10000 — AB

## 2023-05-21 ENCOUNTER — Other Ambulatory Visit: Payer: Self-pay | Admitting: Cardiovascular Disease

## 2023-05-21 ENCOUNTER — Other Ambulatory Visit: Payer: Self-pay | Admitting: Family

## 2023-05-21 DIAGNOSIS — I130 Hypertensive heart and chronic kidney disease with heart failure and stage 1 through stage 4 chronic kidney disease, or unspecified chronic kidney disease: Secondary | ICD-10-CM | POA: Diagnosis not present

## 2023-05-21 DIAGNOSIS — I25119 Atherosclerotic heart disease of native coronary artery with unspecified angina pectoris: Secondary | ICD-10-CM | POA: Diagnosis not present

## 2023-05-21 DIAGNOSIS — I479 Paroxysmal tachycardia, unspecified: Secondary | ICD-10-CM | POA: Diagnosis not present

## 2023-05-21 DIAGNOSIS — E1165 Type 2 diabetes mellitus with hyperglycemia: Secondary | ICD-10-CM | POA: Diagnosis not present

## 2023-05-21 DIAGNOSIS — I70219 Atherosclerosis of native arteries of extremities with intermittent claudication, unspecified extremity: Secondary | ICD-10-CM | POA: Diagnosis not present

## 2023-05-21 DIAGNOSIS — E1151 Type 2 diabetes mellitus with diabetic peripheral angiopathy without gangrene: Secondary | ICD-10-CM | POA: Diagnosis not present

## 2023-05-21 DIAGNOSIS — E785 Hyperlipidemia, unspecified: Secondary | ICD-10-CM | POA: Diagnosis not present

## 2023-05-21 DIAGNOSIS — I509 Heart failure, unspecified: Secondary | ICD-10-CM | POA: Diagnosis not present

## 2023-05-21 DIAGNOSIS — E1142 Type 2 diabetes mellitus with diabetic polyneuropathy: Secondary | ICD-10-CM | POA: Diagnosis not present

## 2023-05-21 DIAGNOSIS — E1122 Type 2 diabetes mellitus with diabetic chronic kidney disease: Secondary | ICD-10-CM | POA: Diagnosis not present

## 2023-05-21 DIAGNOSIS — F1120 Opioid dependence, uncomplicated: Secondary | ICD-10-CM | POA: Diagnosis not present

## 2023-05-21 DIAGNOSIS — J439 Emphysema, unspecified: Secondary | ICD-10-CM | POA: Diagnosis not present

## 2023-05-23 DIAGNOSIS — M545 Low back pain, unspecified: Secondary | ICD-10-CM | POA: Diagnosis not present

## 2023-05-23 DIAGNOSIS — M79662 Pain in left lower leg: Secondary | ICD-10-CM | POA: Diagnosis not present

## 2023-05-23 DIAGNOSIS — Z79891 Long term (current) use of opiate analgesic: Secondary | ICD-10-CM | POA: Diagnosis not present

## 2023-05-23 DIAGNOSIS — M79661 Pain in right lower leg: Secondary | ICD-10-CM | POA: Diagnosis not present

## 2023-05-23 DIAGNOSIS — F1120 Opioid dependence, uncomplicated: Secondary | ICD-10-CM | POA: Diagnosis not present

## 2023-05-31 ENCOUNTER — Encounter (INDEPENDENT_AMBULATORY_CARE_PROVIDER_SITE_OTHER): Payer: Self-pay

## 2023-05-31 ENCOUNTER — Other Ambulatory Visit: Payer: Self-pay | Admitting: Family

## 2023-06-13 DIAGNOSIS — M79661 Pain in right lower leg: Secondary | ICD-10-CM | POA: Diagnosis not present

## 2023-06-13 DIAGNOSIS — M545 Low back pain, unspecified: Secondary | ICD-10-CM | POA: Diagnosis not present

## 2023-06-13 DIAGNOSIS — Z79891 Long term (current) use of opiate analgesic: Secondary | ICD-10-CM | POA: Diagnosis not present

## 2023-06-13 DIAGNOSIS — M79662 Pain in left lower leg: Secondary | ICD-10-CM | POA: Diagnosis not present

## 2023-06-13 DIAGNOSIS — F1120 Opioid dependence, uncomplicated: Secondary | ICD-10-CM | POA: Diagnosis not present

## 2023-06-14 ENCOUNTER — Other Ambulatory Visit: Payer: Self-pay | Admitting: Family

## 2023-07-18 ENCOUNTER — Ambulatory Visit: Admitting: Family

## 2023-07-20 DIAGNOSIS — M545 Low back pain, unspecified: Secondary | ICD-10-CM | POA: Diagnosis not present

## 2023-07-20 DIAGNOSIS — M79661 Pain in right lower leg: Secondary | ICD-10-CM | POA: Diagnosis not present

## 2023-07-20 DIAGNOSIS — M79662 Pain in left lower leg: Secondary | ICD-10-CM | POA: Diagnosis not present

## 2023-07-20 DIAGNOSIS — F1120 Opioid dependence, uncomplicated: Secondary | ICD-10-CM | POA: Diagnosis not present

## 2023-07-20 DIAGNOSIS — Z79891 Long term (current) use of opiate analgesic: Secondary | ICD-10-CM | POA: Diagnosis not present

## 2023-07-26 ENCOUNTER — Other Ambulatory Visit

## 2023-07-26 DIAGNOSIS — E1165 Type 2 diabetes mellitus with hyperglycemia: Secondary | ICD-10-CM

## 2023-07-26 DIAGNOSIS — E782 Mixed hyperlipidemia: Secondary | ICD-10-CM | POA: Diagnosis not present

## 2023-07-26 DIAGNOSIS — I10 Essential (primary) hypertension: Secondary | ICD-10-CM | POA: Diagnosis not present

## 2023-07-27 ENCOUNTER — Ambulatory Visit: Admitting: Family

## 2023-07-28 LAB — CBC WITH DIFFERENTIAL/PLATELET
Basophils Absolute: 0.1 x10E3/uL (ref 0.0–0.2)
Basos: 1 %
EOS (ABSOLUTE): 0.1 x10E3/uL (ref 0.0–0.4)
Eos: 1 %
Hematocrit: 45 % (ref 37.5–51.0)
Hemoglobin: 14.7 g/dL (ref 13.0–17.7)
Immature Grans (Abs): 0 x10E3/uL (ref 0.0–0.1)
Immature Granulocytes: 0 %
Lymphocytes Absolute: 1.9 x10E3/uL (ref 0.7–3.1)
Lymphs: 27 %
MCH: 29.9 pg (ref 26.6–33.0)
MCHC: 32.7 g/dL (ref 31.5–35.7)
MCV: 92 fL (ref 79–97)
Monocytes Absolute: 0.6 x10E3/uL (ref 0.1–0.9)
Monocytes: 8 %
Neutrophils Absolute: 4.5 x10E3/uL (ref 1.4–7.0)
Neutrophils: 63 %
Platelets: 127 x10E3/uL — ABNORMAL LOW (ref 150–450)
RBC: 4.91 x10E6/uL (ref 4.14–5.80)
RDW: 14 % (ref 11.6–15.4)
WBC: 7.1 x10E3/uL (ref 3.4–10.8)

## 2023-07-28 LAB — CMP14+EGFR
ALT: 16 IU/L (ref 0–44)
AST: 18 IU/L (ref 0–40)
Albumin: 4.2 g/dL (ref 3.8–4.9)
Alkaline Phosphatase: 131 IU/L — ABNORMAL HIGH (ref 44–121)
BUN/Creatinine Ratio: 6 — ABNORMAL LOW (ref 9–20)
BUN: 10 mg/dL (ref 6–24)
Bilirubin Total: 0.5 mg/dL (ref 0.0–1.2)
CO2: 24 mmol/L (ref 20–29)
Calcium: 9.1 mg/dL (ref 8.7–10.2)
Chloride: 100 mmol/L (ref 96–106)
Creatinine, Ser: 1.61 mg/dL — ABNORMAL HIGH (ref 0.76–1.27)
Globulin, Total: 2.3 g/dL (ref 1.5–4.5)
Glucose: 121 mg/dL — ABNORMAL HIGH (ref 70–99)
Potassium: 2.8 mmol/L — ABNORMAL LOW (ref 3.5–5.2)
Sodium: 143 mmol/L (ref 134–144)
Total Protein: 6.5 g/dL (ref 6.0–8.5)
eGFR: 50 mL/min/1.73 — ABNORMAL LOW (ref >59)

## 2023-07-28 LAB — HEMOGLOBIN A1C
Est. average glucose Bld gHb Est-mCnc: 117 mg/dL
Hgb A1c MFr Bld: 5.7 % — ABNORMAL HIGH (ref 4.8–5.6)

## 2023-07-28 LAB — LIPID PANEL
Chol/HDL Ratio: 2.8 ratio (ref 0.0–5.0)
Cholesterol, Total: 105 mg/dL (ref 100–199)
HDL: 37 mg/dL — ABNORMAL LOW (ref >39)
LDL Chol Calc (NIH): 37 mg/dL (ref 0–99)
Triglycerides: 191 mg/dL — ABNORMAL HIGH (ref 0–149)
VLDL Cholesterol Cal: 31 mg/dL (ref 5–40)

## 2023-08-03 ENCOUNTER — Other Ambulatory Visit: Payer: Self-pay | Admitting: Family

## 2023-08-03 DIAGNOSIS — E1100 Type 2 diabetes mellitus with hyperosmolarity without nonketotic hyperglycemic-hyperosmolar coma (NKHHC): Secondary | ICD-10-CM

## 2023-08-15 DIAGNOSIS — M545 Low back pain, unspecified: Secondary | ICD-10-CM | POA: Diagnosis not present

## 2023-08-15 DIAGNOSIS — M79662 Pain in left lower leg: Secondary | ICD-10-CM | POA: Diagnosis not present

## 2023-08-15 DIAGNOSIS — M79661 Pain in right lower leg: Secondary | ICD-10-CM | POA: Diagnosis not present

## 2023-08-28 ENCOUNTER — Other Ambulatory Visit: Payer: Self-pay | Admitting: Cardiology

## 2023-08-28 DIAGNOSIS — K219 Gastro-esophageal reflux disease without esophagitis: Secondary | ICD-10-CM

## 2023-09-03 ENCOUNTER — Other Ambulatory Visit: Payer: Self-pay | Admitting: Family

## 2023-09-13 ENCOUNTER — Ambulatory Visit: Payer: Self-pay

## 2023-09-19 DIAGNOSIS — F1120 Opioid dependence, uncomplicated: Secondary | ICD-10-CM | POA: Diagnosis not present

## 2023-09-19 DIAGNOSIS — Z79891 Long term (current) use of opiate analgesic: Secondary | ICD-10-CM | POA: Diagnosis not present

## 2023-09-19 DIAGNOSIS — M79661 Pain in right lower leg: Secondary | ICD-10-CM | POA: Diagnosis not present

## 2023-09-19 DIAGNOSIS — M79662 Pain in left lower leg: Secondary | ICD-10-CM | POA: Diagnosis not present

## 2023-09-19 DIAGNOSIS — M545 Low back pain, unspecified: Secondary | ICD-10-CM | POA: Diagnosis not present

## 2023-09-27 ENCOUNTER — Ambulatory Visit (INDEPENDENT_AMBULATORY_CARE_PROVIDER_SITE_OTHER): Admitting: Family

## 2023-09-27 ENCOUNTER — Encounter: Payer: Self-pay | Admitting: Family

## 2023-09-27 VITALS — BP 108/68 | HR 82 | Ht 70.0 in | Wt 188.8 lb

## 2023-09-27 DIAGNOSIS — E559 Vitamin D deficiency, unspecified: Secondary | ICD-10-CM

## 2023-09-27 DIAGNOSIS — E538 Deficiency of other specified B group vitamins: Secondary | ICD-10-CM

## 2023-09-27 DIAGNOSIS — R310 Gross hematuria: Secondary | ICD-10-CM

## 2023-09-27 DIAGNOSIS — R5383 Other fatigue: Secondary | ICD-10-CM

## 2023-09-27 DIAGNOSIS — I10 Essential (primary) hypertension: Secondary | ICD-10-CM | POA: Diagnosis not present

## 2023-09-27 DIAGNOSIS — I200 Unstable angina: Secondary | ICD-10-CM

## 2023-09-27 DIAGNOSIS — E1100 Type 2 diabetes mellitus with hyperosmolarity without nonketotic hyperglycemic-hyperosmolar coma (NKHHC): Secondary | ICD-10-CM

## 2023-09-27 DIAGNOSIS — J449 Chronic obstructive pulmonary disease, unspecified: Secondary | ICD-10-CM | POA: Diagnosis not present

## 2023-09-27 DIAGNOSIS — E782 Mixed hyperlipidemia: Secondary | ICD-10-CM | POA: Diagnosis not present

## 2023-09-27 LAB — POC CREATINE & ALBUMIN,URINE
Creatinine, POC: 50 mg/dL
Microalbumin Ur, POC: 10 mg/L

## 2023-09-27 NOTE — Progress Notes (Signed)
 Established Patient Office Visit  Subjective:  Patient ID: Jose Carroll, male    DOB: 1966-01-10  Age: 58 y.o. MRN: [id number]  Chief Complaint  Patient presents with   Follow-up    Diabetic shoes  Referral to Urology  BP still dropping  Needs refills    No other concerns at this time.   Past Medical History:  Diagnosis Date   Anginal pain (HCC)    Arthritis    Chronic pain    Chronic, continuous use of opioids    CKD (chronic kidney disease)    Claudication of both lower extremities (HCC)    COPD (chronic obstructive pulmonary disease) (HCC)    Coronary artery disease    a.) LHC 09/10/2014: 50% mLCx, 50% mLAD, 100% p-m RCA; good collaterals from LAD; med mgmt. b.) LHC 06/26/2015: 100% mRCA, 30% dLCx, 40% dLAD; good collaterals from LAD/LCx; med mgmt. c.) LHC 10/28/2017: EF 60%; 100% mRCA, 95% m-dLCx, 30% dLCx; PCI placing a 2.5 x 15 mm Resolute Onyx DES x1 to mLCx. Chronic RCA occlusion with L-R collaterals.   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, unspecified stricture type    T2DM (type 2 diabetes mellitus) (HCC)    Urine retention    Vitamin D  deficiency     Past Surgical History:  Procedure Laterality Date   BACK SURGERY     4   CARDIAC CATHETERIZATION N/A 09/10/2014   Procedure: Left Heart Cath and Coronary Angiogram ;  Surgeon: Denyse DELENA Bathe, MD;  Location: [facility] INVASIVE CV LAB;  Service: Cardiovascular;  Laterality: N/A;   CARDIAC CATHETERIZATION Left 06/26/2015   Procedure: Left Heart Cath  and Coronary Angiography;  Surgeon: Denyse DELENA Bathe, MD;  Location: [facility] INVASIVE CV LAB;  Service: Cardiovascular;  Laterality: Left;   CORONARY STENT INTERVENTION N/A 10/28/2017   Procedure: CORONARY STENT INTERVENTION (2.5 x 15 mm Resolute Onyx DES x 1 to mLCx);  Surgeon: Darron Deatrice DELENA, MD;  Location: [facility] INVASIVE CV LAB;  Service: Cardiovascular;  Laterality: N/A;   FETAL SURGERY FOR CONGENITAL HERNIA  2012   FINGER SURGERY     sixth finger removed   HERNIA REPAIR     LEFT HEART CATH AND CORONARY ANGIOGRAPHY Left 10/28/2017   Procedure: LEFT HEART CATH AND CORONARY ANGIOGRAPHY;  Surgeon: Bathe Denyse DELENA, MD;  Location: [facility] INVASIVE CV LAB;  Service: Cardiovascular;  Laterality: Left;   PULSE GENERATOR IMPLANT N/A 12/29/2020   Procedure: PULSE GENERATOR IMPLANT (MEDTRONIC);  Surgeon: Bluford Standing, MD;  Location: [facility] ORS;  Service: Neurosurgery;  Laterality: N/A;   SUBCLAVIAN ARTERY STENT     THORACIC LAMINECTOMY FOR SPINAL CORD STIMULATOR N/A 12/22/2020   Procedure: THORACIC LAMINECTOMY FOR SPINAL CORD STIMULATOR PADDLE TRIAL (MEDTRONIC);  Surgeon: Bluford Standing, MD;  Location: [facility] ORS;  Service: Neurosurgery;  Laterality: N/A;   TONSILLECTOMY      Social History   Socioeconomic History   Marital status: Married    Spouse name: Not on file   Number of  children: Not on file   Years of education: Not on file   Highest education level: Not on file  Occupational History   Not on file  Tobacco Use   Smoking status: Every Day    Current packs/day: 1.00    Average packs/day: 1 pack/day for 30.7 years (30.7 ttl pk-yrs)    Types: Cigarettes    Start date: 07/04/1990    Last attempt to quit: 07/04/2015   Smokeless tobacco: Former    Quit date: 07/01/2015  Vaping Use   Vaping status: Former   Substances: Nicotine   Devices: zero nicotine  Substance and Sexual Activity   Alcohol use: No    Alcohol/week: 0.0 standard drinks of alcohol   Drug use: No   Sexual activity: Not on file   Other Topics Concern   Not on file  Social History Narrative   Not on file   Social Drivers of Health   Financial Resource Strain: Not on file  Food Insecurity: Not on file  Transportation Needs: Not on file  Physical Activity: Not on file  Stress: Not on file  Social Connections: Not on file  Intimate Partner Violence: Not on file    Family History  Problem Relation Age of Onset   Cancer Mother    Diabetes Mother    Anuerysm Father     Allergies  Allergen Reactions   Naproxen Rash    Review of Systems  All other systems reviewed and are negative.      Objective:   BP 108/68   Pulse 82   Ht 5' 10 (1.778 m)   Wt 188 lb 12.8 oz (85.6 kg)   SpO2 96%   BMI 27.09 kg/m   Vitals:   09/27/23 1515  BP: 108/68  Pulse: 82  Height: 5' 10 (1.778 m)  Weight: 188 lb 12.8 oz (85.6 kg)  SpO2: 96%  BMI (Calculated): 27.09    Physical Exam Vitals and nursing note reviewed.  Constitutional:      Appearance: Normal appearance. He is normal weight.  Eyes:     Pupils: Pupils are equal, round, and reactive to light.  Cardiovascular:     Rate and Rhythm: Normal rate and regular rhythm.     Pulses: Normal pulses.     Heart sounds: Normal heart sounds.  Pulmonary:     Effort: Pulmonary effort is normal.     Breath sounds: Normal breath sounds.  Neurological:     Mental Status: He is alert.  Psychiatric:        Mood and Affect: Mood normal.        Behavior: Behavior normal.      No results found for any visits on 09/27/23.  Recent Results (from the past 2160 hours)  CBC with Differential/Platelet     Status: Abnormal   Collection Time: 07/26/23  3:07 PM  Result Value Ref Range   WBC 7.1 3.4 - 10.8 x10E3/uL   RBC 4.91 4.14 - 5.80 x10E6/uL   Hemoglobin 14.7 13.0 - 17.7 g/dL   Hematocrit 54.9 62.4 - 51.0 %   MCV 92 79 - 97 fL   MCH 29.9 26.6 - 33.0 pg   MCHC 32.7 31.5 - 35.7 g/dL   RDW 85.9 88.3 - 84.5 %   Platelets 127 (L) 150 - 450 x10E3/uL    Neutrophils 63 Not Estab. %   Lymphs 27 Not Estab. %   Monocytes 8 Not Estab. %   Eos 1 Not Estab. %   Basos  1 Not Estab. %   Neutrophils Absolute 4.5 1.4 - 7.0 x10E3/uL   Lymphocytes Absolute 1.9 0.7 - 3.1 x10E3/uL   Monocytes Absolute 0.6 0.1 - 0.9 x10E3/uL   EOS (ABSOLUTE) 0.1 0.0 - 0.4 x10E3/uL   Basophils Absolute 0.1 0.0 - 0.2 x10E3/uL   Immature Granulocytes 0 Not Estab. %   Immature Grans (Abs) 0.0 0.0 - 0.1 x10E3/uL  CMP14+EGFR     Status: Abnormal   Collection Time: 07/26/23  3:07 PM  Result Value Ref Range   Glucose 121 (H) 70 - 99 mg/dL   BUN 10 6 - 24 mg/dL   Creatinine, Ser 8.38 (H) 0.76 - 1.27 mg/dL   eGFR 50 (L) >40 fO/fpw/8.26   BUN/Creatinine Ratio 6 (L) 9 - 20   Sodium 143 134 - 144 mmol/L   Potassium 2.8 (L) 3.5 - 5.2 mmol/L   Chloride 100 96 - 106 mmol/L   CO2 24 20 - 29 mmol/L   Calcium  9.1 8.7 - 10.2 mg/dL   Total Protein 6.5 6.0 - 8.5 g/dL   Albumin 4.2 3.8 - 4.9 g/dL   Globulin, Total 2.3 1.5 - 4.5 g/dL   Bilirubin Total 0.5 0.0 - 1.2 mg/dL   Alkaline Phosphatase 131 (H) 44 - 121 IU/L   AST 18 0 - 40 IU/L   ALT 16 0 - 44 IU/L  Hemoglobin A1c     Status: Abnormal   Collection Time: 07/26/23  3:07 PM  Result Value Ref Range   Hgb A1c MFr Bld 5.7 (H) 4.8 - 5.6 %    Comment:          Prediabetes: 5.7 - 6.4          Diabetes: >6.4          Glycemic control for adults with diabetes: <7.0    Est. average glucose Bld gHb Est-mCnc 117 mg/dL  Lipid panel     Status: Abnormal   Collection Time: 07/26/23  3:07 PM  Result Value Ref Range   Cholesterol, Total 105 100 - 199 mg/dL   Triglycerides 808 (H) 0 - 149 mg/dL   HDL 37 (L) >60 mg/dL   VLDL Cholesterol Cal 31 5 - 40 mg/dL   LDL Chol Calc (NIH) 37 0 - 99 mg/dL   Chol/HDL Ratio 2.8 0.0 - 5.0 ratio    Comment:                                   T. Chol/HDL Ratio                                             Men  Women                               1/2 Avg.Risk  3.4    3.3                                    Avg.Risk  5.0    4.4                                2X Avg.Risk  9.6    7.1                                3X Avg.Risk 23.4   11.0        Assessment & Plan Gross hematuria Needs referral to a different provider, as his previous referral went to a different urology office.  New Referral sent.   Essential hypertension Blood pressure well controlled with current medications.  Continue current therapy.  Will reassess at follow up.   - CBC w/Diff - CMP w/eGFR  Mixed hyperlipidemia Checking labs today.  Continue current therapy for lipid control. Will modify as needed based on labwork results.   -CMP w/eGFR -Lipid Panel  Type II diabetes mellitus with hyperosmolarity, uncontrolled (HCC) Checking labs today. Will call pt. With results  Continue current diabetes POC, as patient has been well controlled on current regimen.  Gave pt. Copy of order for diabetic shoes so he can give them to the company if needed.   Will adjust meds if needed based on labs.   -CBC w/Diff -CMP w/eGFR -Hemoglobin A1C  Unstable angina [facility]) Patient is seen by cardiology, who manage this condition.  He is well controlled with current therapy.   Will defer to them for further changes to plan of care.  Chronic obstructive pulmonary disease, unspecified COPD type (HCC) Patient stable.  Well controlled with current therapy.   Continue current meds.   Vitamin D  deficiency, unspecified B12 deficiency due to diet Other fatigue Checking labs today.  Will continue supplements as needed.   - Vitamin D  - Vitamin B12 - TSH  Essential hypertension, benign Blood pressure well controlled with current medications.  Continue current therapy.  Will reassess at follow up.   - CBC w/Diff - CMP w/eGFR     Return in about 3 months (around 12/27/2023).   Total time spent: 30 minutes  Jose CHRISTELLA ARRANT, FNP  09/27/2023   This document may have been prepared by [facility] Voice Recognition software  and as such may include unintentional dictation errors.

## 2023-09-28 LAB — CBC WITH DIFFERENTIAL/PLATELET
Basophils Absolute: 0.1 x10E3/uL (ref 0.0–0.2)
Basos: 1 %
EOS (ABSOLUTE): 0.1 x10E3/uL (ref 0.0–0.4)
Eos: 2 %
Hematocrit: 45 % (ref 37.5–51.0)
Hemoglobin: 15.3 g/dL (ref 13.0–17.7)
Immature Grans (Abs): 0 x10E3/uL (ref 0.0–0.1)
Immature Granulocytes: 0 %
Lymphocytes Absolute: 1.9 x10E3/uL (ref 0.7–3.1)
Lymphs: 25 %
MCH: 30.5 pg (ref 26.6–33.0)
MCHC: 34 g/dL (ref 31.5–35.7)
MCV: 90 fL (ref 79–97)
Monocytes Absolute: 0.6 x10E3/uL (ref 0.1–0.9)
Monocytes: 8 %
Neutrophils Absolute: 4.8 x10E3/uL (ref 1.4–7.0)
Neutrophils: 64 %
Platelets: 136 x10E3/uL — ABNORMAL LOW (ref 150–450)
RBC: 5.02 x10E6/uL (ref 4.14–5.80)
RDW: 13.7 % (ref 11.6–15.4)
WBC: 7.6 x10E3/uL (ref 3.4–10.8)

## 2023-09-28 LAB — CMP14+EGFR
ALT: 14 IU/L (ref 0–44)
AST: 20 IU/L (ref 0–40)
Albumin: 4.2 g/dL (ref 3.8–4.9)
Alkaline Phosphatase: 123 IU/L (ref 47–123)
BUN/Creatinine Ratio: 6 — ABNORMAL LOW (ref 9–20)
BUN: 10 mg/dL (ref 6–24)
Bilirubin Total: 0.4 mg/dL (ref 0.0–1.2)
CO2: 24 mmol/L (ref 20–29)
Calcium: 9.7 mg/dL (ref 8.7–10.2)
Chloride: 100 mmol/L (ref 96–106)
Creatinine, Ser: 1.64 mg/dL — ABNORMAL HIGH (ref 0.76–1.27)
Globulin, Total: 2.5 g/dL (ref 1.5–4.5)
Glucose: 133 mg/dL — ABNORMAL HIGH (ref 70–99)
Potassium: 3 mmol/L — ABNORMAL LOW (ref 3.5–5.2)
Sodium: 143 mmol/L (ref 134–144)
Total Protein: 6.7 g/dL (ref 6.0–8.5)
eGFR: 49 mL/min/1.73 — ABNORMAL LOW (ref >59)

## 2023-09-28 LAB — LIPID PANEL
Chol/HDL Ratio: 3.2 ratio (ref 0.0–5.0)
Cholesterol, Total: 103 mg/dL (ref 100–199)
HDL: 32 mg/dL — ABNORMAL LOW (ref >39)
LDL Chol Calc (NIH): 36 mg/dL (ref 0–99)
Triglycerides: 225 mg/dL — ABNORMAL HIGH (ref 0–149)
VLDL Cholesterol Cal: 35 mg/dL (ref 5–40)

## 2023-09-28 LAB — HEMOGLOBIN A1C
Est. average glucose Bld gHb Est-mCnc: 120 mg/dL
Hgb A1c MFr Bld: 5.8 % — ABNORMAL HIGH (ref 4.8–5.6)

## 2023-09-28 LAB — TSH: TSH: 2.11 u[IU]/mL (ref 0.450–4.500)

## 2023-09-28 LAB — VITAMIN D 25 HYDROXY (VIT D DEFICIENCY, FRACTURES): Vit D, 25-Hydroxy: 20.1 ng/mL — ABNORMAL LOW (ref 30.0–100.0)

## 2023-09-28 LAB — VITAMIN B12: Vitamin B-12: 567 pg/mL (ref 232–1245)

## 2023-09-30 ENCOUNTER — Telehealth: Payer: Self-pay | Admitting: Family

## 2023-09-30 NOTE — Telephone Encounter (Signed)
 Patient left VM requesting his lab results. Please advise.

## 2023-10-03 NOTE — Assessment & Plan Note (Signed)
 Blood pressure well controlled with current medications.  Continue current therapy.  Will reassess at follow up.   - CBC w/Diff - CMP w/eGFR

## 2023-10-03 NOTE — Assessment & Plan Note (Signed)
 Patient stable.  Well controlled with current therapy.   Continue current meds.

## 2023-10-03 NOTE — Assessment & Plan Note (Signed)
 Patient is seen by cardiology, who manage this condition.  He is well controlled with current therapy.   Will defer to them for further changes to plan of care.

## 2023-10-03 NOTE — Assessment & Plan Note (Signed)
 Checking labs today.  Continue current therapy for lipid control. Will modify as needed based on labwork results.   -CMP w/eGFR -Lipid Panel

## 2023-10-03 NOTE — Assessment & Plan Note (Signed)
 Checking labs today. Will call pt. With results  Continue current diabetes POC, as patient has been well controlled on current regimen.  Gave pt. Copy of order for diabetic shoes so he can give them to the company if needed.   Will adjust meds if needed based on labs.   -CBC w/Diff -CMP w/eGFR -Hemoglobin A1C

## 2023-10-06 ENCOUNTER — Other Ambulatory Visit: Payer: Self-pay | Admitting: Cardiovascular Disease

## 2023-10-06 DIAGNOSIS — I959 Hypotension, unspecified: Secondary | ICD-10-CM

## 2023-10-24 ENCOUNTER — Other Ambulatory Visit: Payer: Self-pay

## 2023-10-24 DIAGNOSIS — M79662 Pain in left lower leg: Secondary | ICD-10-CM | POA: Diagnosis not present

## 2023-10-24 DIAGNOSIS — M79661 Pain in right lower leg: Secondary | ICD-10-CM | POA: Diagnosis not present

## 2023-10-24 DIAGNOSIS — M545 Low back pain, unspecified: Secondary | ICD-10-CM | POA: Diagnosis not present

## 2023-10-24 DIAGNOSIS — K219 Gastro-esophageal reflux disease without esophagitis: Secondary | ICD-10-CM

## 2023-10-24 DIAGNOSIS — F1120 Opioid dependence, uncomplicated: Secondary | ICD-10-CM | POA: Diagnosis not present

## 2023-10-24 DIAGNOSIS — Z79891 Long term (current) use of opiate analgesic: Secondary | ICD-10-CM | POA: Diagnosis not present

## 2023-10-24 MED ORDER — PANTOPRAZOLE SODIUM 40 MG PO TBEC: 40.0000 mg | DELAYED_RELEASE_TABLET | Freq: Every day | ORAL | 1 refills | Status: AC

## 2023-11-11 ENCOUNTER — Ambulatory Visit: Admitting: Urology

## 2023-11-15 ENCOUNTER — Ambulatory Visit: Admitting: Urology

## 2023-11-16 ENCOUNTER — Other Ambulatory Visit: Payer: Self-pay | Admitting: Cardiovascular Disease

## 2023-11-16 ENCOUNTER — Other Ambulatory Visit: Payer: Self-pay | Admitting: Family

## 2023-11-16 ENCOUNTER — Other Ambulatory Visit

## 2023-11-16 DIAGNOSIS — Z79891 Long term (current) use of opiate analgesic: Secondary | ICD-10-CM | POA: Diagnosis not present

## 2023-11-16 DIAGNOSIS — M79662 Pain in left lower leg: Secondary | ICD-10-CM | POA: Diagnosis not present

## 2023-11-16 DIAGNOSIS — M79661 Pain in right lower leg: Secondary | ICD-10-CM | POA: Diagnosis not present

## 2023-11-16 DIAGNOSIS — M545 Low back pain, unspecified: Secondary | ICD-10-CM | POA: Diagnosis not present

## 2023-11-16 DIAGNOSIS — I25118 Atherosclerotic heart disease of native coronary artery with other forms of angina pectoris: Secondary | ICD-10-CM

## 2023-11-16 DIAGNOSIS — F1120 Opioid dependence, uncomplicated: Secondary | ICD-10-CM | POA: Diagnosis not present

## 2023-11-23 ENCOUNTER — Other Ambulatory Visit: Payer: Self-pay | Admitting: Family

## 2023-11-25 DIAGNOSIS — E119 Type 2 diabetes mellitus without complications: Secondary | ICD-10-CM | POA: Diagnosis not present

## 2023-12-01 ENCOUNTER — Encounter: Payer: Self-pay | Admitting: Urology

## 2023-12-01 ENCOUNTER — Ambulatory Visit: Payer: Self-pay | Admitting: Urology

## 2023-12-01 ENCOUNTER — Ambulatory Visit (INDEPENDENT_AMBULATORY_CARE_PROVIDER_SITE_OTHER): Admitting: Urology

## 2023-12-01 ENCOUNTER — Other Ambulatory Visit
Admission: RE | Admit: 2023-12-01 | Discharge: 2023-12-01 | Disposition: A | Discharge status: Home / Self Care | Attending: Urology | Admitting: Urology

## 2023-12-01 ENCOUNTER — Other Ambulatory Visit: Payer: Self-pay

## 2023-12-01 VITALS — BP 121/79 | HR 80 | Wt 196.0 lb

## 2023-12-01 DIAGNOSIS — N35912 Unspecified bulbous urethral stricture, male: Secondary | ICD-10-CM

## 2023-12-01 DIAGNOSIS — A491 Streptococcal infection, unspecified site: Secondary | ICD-10-CM

## 2023-12-01 DIAGNOSIS — R310 Gross hematuria: Secondary | ICD-10-CM | POA: Insufficient documentation

## 2023-12-01 DIAGNOSIS — R3989 Other symptoms and signs involving the genitourinary system: Secondary | ICD-10-CM

## 2023-12-01 LAB — URINALYSIS, COMPLETE (UACMP) WITH MICROSCOPIC
Bilirubin Urine: NEGATIVE
Glucose, UA: >=500 mg/dL — AB
Hgb urine dipstick: NEGATIVE
Ketones, ur: NEGATIVE mg/dL
Nitrite: POSITIVE — AB
Protein, ur: NEGATIVE mg/dL
Specific Gravity, Urine: 1.008 (ref 1.005–1.030)
pH: 7 (ref 5.0–8.0)

## 2023-12-01 LAB — BLADDER SCAN AMB NON-IMAGING

## 2023-12-01 NOTE — Patient Instructions (Signed)
 You have a narrowing in your urethra called a urethral stricture that is causing your urinary symptoms.  This is treated with a balloon dilation under anesthesia to stretch the area.  We will also be able to look at your prostate and bladder after opening the scarred area.  You have a catheter for 2 days.  There is a low risk of bleeding or infection, and the success rate is 80%.     Urethral Stricture  Urethral stricture is when the tube that drains pee (urine) from the bladder out of the body (urethra) becomes too narrow. The urethra can become narrow because of scar tissue, infection, surgery, or an injury. This can make it difficult to pee (urinate). In females, the urethra opens above the vaginal opening. In males, the urethra opens at the tip of the penis, and the urethra is much longer than it is in females. Because of the length of the male urethra, urethral stricture is much more common in males. What are the causes? In males and females, common causes of urethral stricture include: Urinary tract infection (UTI). Sexually transmitted infection (STI). Using a soft tube in the urethra to drain pee from the bladder (urinary catheter). Urinary tract surgery. In males, common causes of urethral stricture include: A severe injury to the pelvis. Prostate surgery. Injury to the penis. In many cases, the cause of urethral stricture is not known. What increases the risk? You are more likely to develop this condition if you: Are male. Males who have had prostate surgery are at risk of developing this condition. Use a urinary catheter. Have had urinary tract surgery. What are the signs or symptoms? The main symptom of this condition is trouble peeing. This may cause decreased pee flow, dribbling, or spraying of pee. Other symptom of this condition may include: Frequent UTIs. Blood in the pee. Pain when peeing. Swelling of the penis in males. Not being able to pee. How is this  diagnosed? This condition may be diagnosed based on: Your medical history and a physical exam. Tests of your pee to check for infection or bleeding. X-rays. Ultrasound. Retrograde urethrogram. With this test, a dye is injected into the urethra and then an X-ray is taken. Urethroscopy. This is when a thin tube with a light and camera on the end (urethroscope) is used to look at the urethra. A CT scan or MRI. How is this treated? This condition is treated with surgery or other procedures. The type of surgery that you have depends on the severity of your condition. You may have: Urethral dilation. In this procedure, the narrow part of the urethra is stretched open (dilated) with dilating instruments or a small balloon. Urethrotomy. In this procedure, a urethroscope is placed into the urethra, and the narrow part of the urethra is cut open with a surgical blade or laser inserted through the urethroscope. Urethroplasty. In this procedure, an incision is made in the urethra and the narrow part is removed. Then, the urethra is reconstructed. Follow these instructions at home: Take over-the-counter and prescription medicines only as told by your health care provider. If you were prescribed antibiotics, take them as told by your provider. Do not stop using the antibiotic even if you start to feel better. Drink enough fluid to keep your pee pale yellow. Keep all follow-up visits. Your provider will check your healing and adjust your treatment plan as needed. Contact a health care provider if: You have frequent peeing or you are only peeing small  amounts often. You feel the need to pee urgently. You have pain or burning when you pee. Your pee smells bad or unusual. Your pee is bloody or cloudy. You have pain in your lower abdomen or back. Your genital area is swollen, bruised, or discolored. This includes: The penis, scrotum, and inner thighs for males. The outer genital organs (vulva) and inner  thighs for females. You have a fever. You develop swelling in your legs. Get help right away if: You cannot pee. You have trouble breathing. These symptoms may be an emergency. Get help right away. Call 911. Do not wait to see if the symptoms will go away. Do not drive yourself to the hospital. This information is not intended to replace advice given to you by your health care provider. Make sure you discuss any questions you have with your health care provider. Document Revised: 10/22/2021 Document Reviewed: 10/22/2021 Elsevier Patient Education  2024 [employer].

## 2023-12-01 NOTE — Progress Notes (Signed)
   12/01/2023 1:09 PM   Jose Carroll 1965-03-03 [id number]  Reason for visit: Follow up urethral stricture, ED  History: Last seen February 2025, found to have approximately 3 French proximal urethral stricture and was scheduled for cystoscopy, Optilume balloon dilation, retrograde urethrogram He no-showed surgery day and has not followed up Also has ED, cannot take PDE 5 inhibitor secondary to daily nitrate, Trimix was not affordable  Physical Exam: BP 121/79 (BP Location: Left Arm, Patient Position: Sitting, Cuff Size: Normal)   Pulse 80   Wt 196 lb (88.9 kg)   SpO2 98%   BMI 28.12 kg/m  GU: Left scrotal tenderness, no worrisome masses, moderate size left epididymal cyst  Imaging/labs: Urinalysis and culture today pending  Today: Persistent weak urinary stream and dysuria, PVR today elevated to 15ml Reports some left-sided scrotal pain  Plan:   Urethral stricture: I again recommended the OR for retrograde urethrogram, Optilume balloon dilation, cystoscopy for further evaluation of other upstream issues, but suspect he will see significant improvement after balloon dilation of urethral stricture.  Needs cardiology clearance to hold anticoagulation. Scrotal pain: Will follow-up urine culture to evaluate for infection ED: He may be interested in reconsidering Trimix after urethral stricture treated Schedule cystoscopy, retrograde urethrogram, Optilume balloon dilation   Jose JAYSON Burnet, MD  [facility] [address], [address] [place], [place] [address] [phone]

## 2023-12-02 LAB — URINE CULTURE: Culture: >=100000 — AB

## 2023-12-02 MED ORDER — CEPHALEXIN 500 MG PO CAPS
500.0000 mg | ORAL_CAPSULE | Freq: Two times a day (BID) | ORAL | 0 refills | Status: DC
Start: 2023-12-02 — End: 2023-12-12

## 2023-12-02 NOTE — Telephone Encounter (Signed)
Called pt informed him of the information below. Pt voiced understanding. RX sent.  °

## 2023-12-02 NOTE — Telephone Encounter (Signed)
-----   Message from Jose Carroll sent at 12/01/2023  3:03 PM EST ----- Urine today was concerning for UTI.  Please send for culture.  Please send in Keflex  500 mg twice daily x 10 days, will call if need to change antibiotics based on culture results.  This is the  likely cause of his left scrotal pain  Jose Burnet, MD 12/01/2023  ----- Message ----- From: Rebecka, Lab In [place] Sent: 12/01/2023   2:37 PM EST To: Jose JAYSON Burnet, MD

## 2023-12-04 ENCOUNTER — Other Ambulatory Visit: Payer: Self-pay | Admitting: Family

## 2023-12-05 MED ORDER — AMOXICILLIN 500 MG PO TABS: 500.0000 mg | ORAL_TABLET | Freq: Two times a day (BID) | ORAL | 0 refills | Status: AC

## 2023-12-05 NOTE — Addendum Note (Signed)
 Addended by: ELOUISE SANTA BROCKS on: 12/05/2023 09:52 AM   Modules accepted: Orders

## 2023-12-05 NOTE — Telephone Encounter (Signed)
 Called pt no answer, left detailed message per DPR informing pt of the need to change antibiotic. RX sent. Advised pt to call back for questions or concerns.

## 2023-12-06 ENCOUNTER — Other Ambulatory Visit: Payer: Self-pay

## 2023-12-06 DIAGNOSIS — N35912 Unspecified bulbous urethral stricture, male: Secondary | ICD-10-CM

## 2023-12-06 NOTE — Progress Notes (Signed)
 Surgical Physician Order Form [facility] [place]  Dr. Redell Burnet, MD  * Scheduling expectation : Patient prefers January 2026  *Length of Case: 30 minutes  *Clearance needed: yes, cardiology  *Anticoagulation Instructions: Hold all anticoagulants  *Aspirin  Instructions: Okay to continue 81 mg aspirin   *Post-op visit Date/Instructions:  1-3 day cath removal  *Diagnosis: Urethral Stricture  *Procedure: Retrograde urethrogram, cystoscopy, Optilume balloon dilation   Additional orders: N/A  -Admit type: OUTpatient  -Anesthesia: General  -VTE Prophylaxis Standing Order SCD's       Other:   -Standing Lab Orders Per Anesthesia    Lab other: UA&Urine Culture  -Standing Test orders EKG/Chest x-ray per Anesthesia       Test other:   - Medications:  Ancef  2gm IV  -Other orders:  N/A

## 2023-12-16 NOTE — Progress Notes (Signed)
   [facility] Surgical Posting Form  Surgery Date: Date: 01/16/2024  Surgeon: Dr. Redell Burnet, MD  Inpt ( No  )   Outpt (Yes)   Obs ( No  )   Diagnosis: N35.912 Urethral Stricture  -CPT: 48389, [phone]  Surgery: Cystoscopy with Urethral Balloon Dilation using Optilume and Retrograde Urethrogram  Stop Anticoagulations: Yes, may continue ASA  Cardiac/Medical/Pulmonary Clearance needed: yes  Clearance needed from Dr: [facility]  Clearance request sent on: Date: 12/16/23  *Orders entered into EPIC  Date: 12/16/23   *Case booked in EPIC  Date: 12/16/23  *Notified pt of Surgery: Date: 12/16/23  PRE-OP UA & CX: yes, will obtain in clinic on 01/02/2024   *Placed into Prior Authorization Work [place] Date: 12/16/23  Assistant/laser/rep:No

## 2023-12-19 ENCOUNTER — Telehealth: Payer: Self-pay

## 2023-12-19 NOTE — Progress Notes (Signed)
  Phone Number: [phone] for Surgical Coordinator Fax Number: [phone]  REQUEST FOR SURGICAL CLEARANCE      Date: Date: 01/16/2024  Faxed to: [facility]  Surgeon: Dr. Redell Burnet, MD     Date of Surgery: 01/16/2024  Operation: Cystoscopy with Urethral Balloon Dilation using Optilume and Retrograde Urethrogram   Anesthesia Type: General   Diagnosis: Urethral Stricture  Patient Requires:   Cardiac / Vascular Clearance : Yes  Reason: Patient will need to hold Brilinta    Risk Assessment:    Low   []       Moderate   []     High   []           This patient is optimized for surgery  YES []       NO   []    I recommend further assessment/workup prior to surgery. YES []      NO  []   Appointment scheduled for: _______________________   Further recommendations: ____________________________________     Physician Signature:__________________________________   Printed Name: ________________________________________   Date: _________________

## 2023-12-19 NOTE — Telephone Encounter (Signed)
 We received notification the patient's procedure has been moved up to 01/16/24.

## 2023-12-19 NOTE — Telephone Encounter (Signed)
 Per Dr. Francisca, Patient is to be scheduled for Cystoscopy with Urethral Balloon Dilation using Optilume and Retrograde Urethrogram   Mr. Magid was contacted and possible surgical dates were discussed, Monday January 5th, 2025 was agreed upon for surgery.  Patient was instructed that Dr. Francisca will require them to provide a pre-op UA & CX prior to surgery. This was ordered and scheduled drop off appointment was made for 01/02/2024.    Patient was directed to call [phone] between 1-3pm the day before surgery to find out surgical arrival time.  Instructions were given not to eat or drink from midnight on the night before surgery and have a driver for the day of surgery. On the surgery day patient was instructed to enter through the [facility] entrance of [facility] report the Same Day Surgery desk.   Pre-Admit Testing will be in contact via phone to set up an interview with the anesthesia team to review your history and medications prior to surgery.   Reminder of this information was sent via Mail to the patient.

## 2023-12-26 DIAGNOSIS — M79662 Pain in left lower leg: Secondary | ICD-10-CM | POA: Diagnosis not present

## 2023-12-26 DIAGNOSIS — M79661 Pain in right lower leg: Secondary | ICD-10-CM | POA: Diagnosis not present

## 2023-12-26 DIAGNOSIS — F1120 Opioid dependence, uncomplicated: Secondary | ICD-10-CM | POA: Diagnosis not present

## 2023-12-26 DIAGNOSIS — Z79891 Long term (current) use of opiate analgesic: Secondary | ICD-10-CM | POA: Diagnosis not present

## 2023-12-26 DIAGNOSIS — M545 Low back pain, unspecified: Secondary | ICD-10-CM | POA: Diagnosis not present

## 2023-12-30 ENCOUNTER — Telehealth (HOSPITAL_BASED_OUTPATIENT_CLINIC_OR_DEPARTMENT_OTHER): Payer: Self-pay | Admitting: *Deleted

## 2023-12-30 ENCOUNTER — Ambulatory Visit: Admitting: Cardiology

## 2023-12-30 NOTE — Telephone Encounter (Signed)
" ° °  Name: Jose Carroll  DOB: [birth year]  MRN: [id number]  Primary Cardiologist: Evalene Lunger, MD  Chart reviewed as part of pre-operative protocol coverage. Because of [name]'s past medical history and time since last visit, he will require a follow-up in-office visit in order to better assess preoperative cardiovascular risk.  Pre-op covering staff: - Please schedule appointment and call patient to inform them. If patient already had an upcoming appointment within acceptable timeframe, please add pre-op clearance to the appointment notes so provider is aware. - Please contact requesting surgeon's office via preferred method (i.e, phone, fax) to inform them of need for appointment prior to surgery.  Aspirin  and plavix hold can be addressed at the time of in office visit.   [name], PA-C  12/30/2023, 10:26 AM   "

## 2023-12-30 NOTE — Telephone Encounter (Signed)
"  ° °  Pre-operative Risk Assessment    Patient Name: Jose Carroll  DOB: [birth year] MRN: [id number]   Date of last office visit: 12/06/22 DR. GOLLAN Date of next office visit: NONE   Request for Surgical Clearance    Procedure:  Retrograde urethrogram, cystoscopy, Optilume balloon dilation  Date of Surgery:  Clearance 01/16/24                                Surgeon:  DR. FRANCISCA Surgeon's Group or Practice Name:  [facility] [place] Phone number:  Phone [phone]  Fax number:  Fax [phone] , ATTN: ELEANOR SILVERSMITH, CMA   Type of Clearance Requested:   - Medical  - Pharmacy:  Hold Aspirin      Type of Anesthesia:  General    Additional requests/questions:    [name]   12/30/2023, 10:00 AM      Signed      Surgical Physician Order Form [facility]    Dr. Redell Francisca, MD   * Scheduling expectation : Patient prefers January 2026   *Length of Case: 30 minutes   *Clearance needed: yes, cardiology   *Anticoagulation Instructions: Hold all anticoagulants   *Aspirin  Instructions: Okay to continue 81 mg aspirin    *Post-op visit Date/Instructions:  1-3 day cath removal   *Diagnosis: Urethral Stricture   *Procedure: Retrograde urethrogram, cystoscopy, Optilume balloon dilation     Additional orders: N/A   -Admit type: OUTpatient   -Anesthesia: General   -VTE Prophylaxis Standing Order SCD's       Other:    -Standing Lab Orders Per Anesthesia     Lab other: UA&Urine Culture   -Standing Test orders EKG/Chest x-ray per Anesthesia             Test other:    - Medications:  Ancef  2gm IV   -Other orders:  N/A          "

## 2023-12-30 NOTE — Telephone Encounter (Signed)
 He rescheduled the appointment you gave him until next week.

## 2023-12-30 NOTE — Telephone Encounter (Signed)
 Tylene Lunch, NP noted the pt called back and rescheduled his appt to 01/06/24 Cadence Furth, PAC. I will update the requesting office as well.

## 2023-12-30 NOTE — Telephone Encounter (Signed)
 Pt has been scheduled in office with Tylene Lunch, NP today for preop clearance.

## 2024-01-02 ENCOUNTER — Other Ambulatory Visit

## 2024-01-02 DIAGNOSIS — N35912 Unspecified bulbous urethral stricture, male: Secondary | ICD-10-CM | POA: Diagnosis not present

## 2024-01-02 LAB — URINALYSIS, COMPLETE
Bilirubin, UA: NEGATIVE
Ketones, UA: NEGATIVE
Nitrite, UA: POSITIVE — AB
Protein,UA: NEGATIVE
RBC, UA: NEGATIVE
Specific Gravity, UA: 1.01 (ref 1.005–1.030)
Urobilinogen, Ur: 0.2 mg/dL (ref 0.2–1.0)
pH, UA: 6.5 (ref 5.0–7.5)

## 2024-01-02 LAB — MICROSCOPIC EXAMINATION

## 2024-01-03 NOTE — Progress Notes (Deleted)
 " Cardiology Office Note   Date: 01/03/2024  ID:  Jose Carroll [birth year] [id number] PCP: Orlean Alan HERO, FNP  [facility] Providers Cardiologist: Evalene Lunger, MD { Click to update primary MD,subspecialty MD or APP then REFRESH:1}    Chief Complaint: Jose Carroll is a 58 y.o.male with PMH of CAD s/p DES to LCx 10/28/2017 and CTO of RCA, PAD with moderate disease of RLE on ABI 03/21/23, hypertension, hyperlipidemia, COPD, T2DM, smoker, chronic back pain who presents to the clinic for preoperative cardiac assessment for retrograde urethrogram, cystoscopy, and balloon dilation.     Jose Carroll established with Dr. Gollan 05/2022 after receiving care at an outside practice. History reviewed prior to this visit includes LHC 10/28/2017 with CTO of RCA and DES placed to LCx. Echo 11/2020 with reportedly normal LVEF. 03/2022 ABIs normal bilaterally.   During first visit with Dr. Gollan 05/31/22 he was hypotensive, Imdur  was decreased to 60 mg BID. He was continued on losartan  25 mg daily and metoprolol  succinate 100 mg daily. Follow-up visit 12/06/2022 noted one episode of chest pain for which he took 4 NTGs with improvement in symptoms. Still hypotensive, metoprolol  succinate decreased to 50 mg daily.   He was scheduled to have retrograde urethrogram, cystoscopy, and balloon dilation for urethral stricture 02/2023, but unfortunately did not show to the appointment.     History of Present Illness: Today ***  ?urethrogram/cystoscopy, METs, RCRI, CP, SOB, edema, palpitations, dizziness, syncope, falls, bleeding, claudication, seeing [facility] ??clear for surgery, CMP, CBC  CAD: S/p DES to LCx 10/28/2017 and CTO of RCA. Denies further symptoms *** - Continue Brilinta  60 mg BID - Continue Toprol -XL 100 mg daily - Continue atorvastatin  80 mg daily and Vascepa  1g BID - Continue Imdur  60 mg BID and Ranexa  1000 mg BID  Hypertension: Has actually been hypotensive last couple of office  visits. BP today ***  - Continue losartan  12.5 mg daily  - Continue Toprol -XL and Imdur  as above  Hyperlipidemia, LDL goal < 55: 09/27/2023 LDL 36, HDL 32, TGs 225, total 103. 07/26/2023 AST 18, ALT 16. - Continue atorvastatin  80 mg daily and Vascepa  1g BID   PAD: Moderate disease in RLE on ABI 03/21/23. - Follows with [facility]  Pre-operative cardiac assessment: Patient scheduled for retrograde urethrogram, cystoscopy, and balloon dilation for urethral stricture 01/17/2024. RCRI low risk, 0.9% risk of major cardiac event perioperatively. METS ***  - ??? Cardiac PET stress - Per review of past documentation, 02/21/23 Dr. Gollan (his primary cardiologist) stated pt may hold Brilinta  5-7 days prior to procedure and should resume as soon as he is hemodynamically stable. He is not on aspirin  therapy.     ROS: Please see the history of present illness. All other systems reviewed and are negative.   Studies Reviewed: The following studies were personally reviewed today ***:      EKG Interpretation Date/Time:    Ventricular Rate:    PR Interval:    QRS Duration:    QT Interval:    QTC Calculation:   R Axis:      Text Interpretation:      Risk Assessment/Calculations:  {Does this patient have ATRIAL FIBRILLATION?[phone]}  No BP recorded.  {Refresh Note OR Click here to enter BP  :1}***           Physical Exam: VS: There were no vitals taken for this visit. Wt Readings from Last 3 Encounters:  12/01/23 196 lb (88.9 kg)  09/27/23 188 lb 12.8 oz (85.6  kg)  05/13/23 189 lb (85.7 kg)     GEN: *** Well nourished, in NAD HEENT: Normal NECK: No JVD, no carotid bruits LYMPHATICS: No lymphadenopathy CARDIAC: ***RRR, no murmurs, rubs, gallops RESPIRATORY: Clear to auscultation without rales, wheezing or rhonchi  ABDOMEN: Soft, non-tender, non-distended MUSCULOSKELETAL: No edema, no deformity  SKIN: Warm and dry NEUROLOGIC:  Alert and oriented x 3 PSYCHIATRIC:  Normal affect    Assessment & Plan: ***    {Are you ordering a CV Procedure (e.g. stress test, cath, DCCV, TEE, etc)?   Press F2        :[id number]}   Dispo: ***  Signed, Saddie GORMAN Cleaves, NP 01/03/2024 3:18 PM  [facility] "

## 2024-01-06 ENCOUNTER — Ambulatory Visit

## 2024-01-07 LAB — CULTURE, URINE COMPREHENSIVE

## 2024-01-08 NOTE — Progress Notes (Signed)
"  error  "

## 2024-01-09 ENCOUNTER — Encounter
Admission: RE | Admit: 2024-01-09 | Discharge: 2024-01-09 | Disposition: A | Discharge status: Home / Self Care | Source: Ambulatory Visit | Attending: Urology | Admitting: Urology

## 2024-01-09 ENCOUNTER — Other Ambulatory Visit: Payer: Self-pay

## 2024-01-09 DIAGNOSIS — N35819 Other urethral stricture, male, unspecified site: Secondary | ICD-10-CM

## 2024-01-09 DIAGNOSIS — E1100 Type 2 diabetes mellitus with hyperosmolarity without nonketotic hyperglycemic-hyperosmolar coma (NKHHC): Secondary | ICD-10-CM

## 2024-01-09 DIAGNOSIS — I10 Essential (primary) hypertension: Secondary | ICD-10-CM

## 2024-01-09 DIAGNOSIS — J439 Emphysema, unspecified: Secondary | ICD-10-CM

## 2024-01-09 NOTE — Pre-Procedure Instructions (Signed)
 Appointment with Dr. Gollan on 12/31, pending stop date for Brilinta .

## 2024-01-09 NOTE — Patient Instructions (Addendum)
 Your procedure is scheduled on: 01/16/24 - Monday Report to the Registration Desk on the 1st floor of the [facility]. To find out your arrival time, please call [phone] between 1PM - 3PM on: 01/13/24 - Friday If your arrival time is 6:00 am, do not arrive before that time as the [facility] entrance doors do not open until 6:00 am.  REMEMBER: Instructions that are not followed completely may result in serious medical risk, up to and including death; or upon the discretion of your surgeon and anesthesiologist your surgery may need to be rescheduled.  Do not eat food or drink any liquids after midnight the night before surgery.  No gum chewing or hard candies.  One week prior to surgery: Stop Anti-inflammatories (NSAIDS) such as Advil, Aleve, Ibuprofen, Motrin, Naproxen, Naprosyn and Aspirin  based products such as Excedrin, Goody's Powder, BC Powder. You may take Tylenol  if needed for pain up until the day of surgery.  Stop ANY OVER THE COUNTER supplements until after surgery.  Dulaglutide  (TRULICITY ) hold 7 days prior to your surgery.  insulin  degludec (TRESIBA  FLEXTOUCH) inject 1/2 of your bedtime dose on the night before your surgery.  JARDIANCE  hold beginning 01/02 , may resume taking after surgery.  ticagrelor  (BRILINTA ) - stop date pending , MD appointmentYour procedure is scheduled on: Report to the Registration Desk on the 1st floor of the [facility]. To find out your arrival time, please call [phone] between 1PM - 3PM on: If your arrival time is 6:00 am, do not arrive before that time as the [facility] entrance doors do not open until 6:00 am.  REMEMBER: Instructions that are not followed completely may result in serious medical risk, up to and including death; or upon the discretion of your surgeon and anesthesiologist your surgery may need to be rescheduled.  Do not eat food after midnight the night before surgery.  No gum chewing or hard candies.  You  may however, drink CLEAR liquids up to 2 hours before you are scheduled to arrive for your surgery. Do not drink anything within 2 hours of your scheduled arrival time.  Clear liquids include: - water  - apple juice without pulp - gatorade (not RED colors) - black coffee or tea (Do NOT add milk or creamers to the coffee or tea) Do NOT drink anything that is not on this list.  **Type 1 and Type 2 diabetics should only drink water.**  In addition, your doctor has ordered for you to drink the provided:  Ensure Pre-Surgery Clear Carbohydrate Drink  Gatorade G2 Drinking this carbohydrate drink up to two hours before surgery helps to reduce insulin  resistance and improve patient outcomes. Please complete drinking 2 hours before scheduled arrival time.  One week prior to surgery: Stop Anti-inflammatories (NSAIDS) such as Advil, Aleve, Ibuprofen, Motrin, Naproxen, Naprosyn and Aspirin  based products such as Excedrin, Goody's Powder, BC Powder. Stop ANY OVER THE COUNTER supplements until after surgery.  You may however, continue to take Tylenol  if needed for pain up until the day of surgery.  **Follow guidelines for insulin  and diabetes medications.**  **Follow recommendations regarding stopping blood thinners.**  Continue taking all of your other prescription medications up until the day of surgery.  ON THE DAY OF SURGERY ONLY TAKE THESE MEDICATIONS WITH SIPS OF WATER:    Use inhalers on the day of surgery and bring to the hospital.  Fleets enema or bowel prep as directed.  No Alcohol for 24 hours before or after surgery.  No Smoking including e-cigarettes for 24 hours before surgery.  No chewable tobacco products for at least 6 hours before surgery.  No nicotine patches on the day of surgery.  Do not use any recreational drugs for at least a week (preferably 2 weeks) before your surgery.  Please be advised that the combination of cocaine and anesthesia may have negative  outcomes, up to and including death. If you test positive for cocaine, your surgery will be cancelled.  On the morning of surgery brush your teeth with toothpaste and water, you may rinse your mouth with mouthwash if you wish. Do not swallow any toothpaste or mouthwash.  Use CHG Soap or wipes as directed on instruction sheet.  Do not wear jewelry, make-up, hairpins, clips or nail polish.  For welded (permanent) jewelry: bracelets, anklets, waist bands, etc.  Please have this removed prior to surgery.  If it is not removed, there is a chance that hospital personnel will need to cut it off on the day of surgery.  Do not wear lotions, powders, or perfumes.   Do not shave body hair from the neck down 48 hours before surgery.  Contact lenses, hearing aids and dentures may not be worn into surgery.  Do not bring valuables to the hospital. [facility] is not responsible for any missing/lost belongings or valuables.   Total Shoulder Arthroplasty:  use Benzoyl Peroxide 5% Gel as directed on instruction sheet.  Bring your C-PAP to the hospital in case you may have to spend the night.   Notify your doctor if there is any change in your medical condition (cold, fever, infection).  Wear comfortable clothing (specific to your surgery type) to the hospital.  After surgery, you can help prevent lung complications by doing breathing exercises.  Take deep breaths and cough every 1-2 hours. Your doctor may order a device called an Incentive Spirometer to help you take deep breaths. When coughing or sneezing, hold a pillow firmly against your incision with both hands. This is called splinting. Doing this helps protect your incision. It also decreases belly discomfort.  If you are being admitted to the hospital overnight, leave your suitcase in the car. After surgery it may be brought to your room.  In case of increased patient census, it may be necessary for you, the patient, to continue your  postoperative care in the Same Day Surgery department.  If you are being discharged the day of surgery, you will not be allowed to drive home. You will need a responsible individual to drive you home and stay with you for 24 hours after surgery.   If you are taking public transportation, you will need to have a responsible individual with you.  Please call the Pre-admissions Testing Dept. at [phone] if you have any questions about these instructions.  Surgery Visitation Policy:  Patients having surgery or a procedure may have two visitors.  Children under the age of 65 must have an adult with them who is not the patient.  Inpatient Visitation:    Visiting hours are 7 a.m. to 8 p.m. Up to four visitors are allowed at one time in a patient room. The visitors may rotate out with other people during the day.  One visitor age 65 or older may stay with the patient overnight and must be in the room by 8 p.m.   Merchandiser, Retail to address health-related social needs:  [email or web address] with Dr. Gollan on 12/31.  ON THE DAY OF SURGERY ONLY TAKE  THESE MEDICATIONS WITH SIPS OF WATER:  fluticasone (FLONASE)  gabapentin  (NEURONTIN )  isosorbide  mononitrate (IMDUR )  metoprolol  succinate  pantoprazole  (PROTONIX )  ranolazine  (RANEXA )  tiZANidine  (ZANAFLEX )  tamsulosin  (FLOMAX )  VASCEPA    Use inhalers on the day of surgery and bring to the hospital.  No Alcohol for 24 hours before or after surgery.  No Smoking including e-cigarettes for 24 hours before surgery.  No chewable tobacco products for at least 6 hours before surgery.  No nicotine patches on the day of surgery.  Do not use any recreational drugs for at least a week (preferably 2 weeks) before your surgery.  Please be advised that the combination of cocaine and anesthesia may have negative outcomes, up to and including death. If you test positive for cocaine, your surgery will be cancelled.  On  the morning of surgery brush your teeth with toothpaste and water, you may rinse your mouth with mouthwash if you wish. Do not swallow any toothpaste or mouthwash.  Do not wear jewelry, make-up, hairpins, clips or nail polish.  For welded (permanent) jewelry: bracelets, anklets, waist bands, etc.  Please have this removed prior to surgery.  If it is not removed, there is a chance that hospital personnel will need to cut it off on the day of surgery.  Do not wear lotions, powders, or perfumes.   Do not shave body hair from the neck down 48 hours before surgery.  Contact lenses, hearing aids and dentures may not be worn into surgery.  Do not bring valuables to the hospital. [facility] is not responsible for any missing/lost belongings or valuables.   Notify your doctor if there is any change in your medical condition (cold, fever, infection).  Wear comfortable clothing (specific to your surgery type) to the hospital.  After surgery, you can help prevent lung complications by doing breathing exercises.  Take deep breaths and cough every 1-2 hours. Your doctor may order a device called an Incentive Spirometer to help you take deep breaths.  If you are being admitted to the hospital overnight, leave your suitcase in the car. After surgery it may be brought to your room.  In case of increased patient census, it may be necessary for you, the patient, to continue your postoperative care in the Same Day Surgery department.  If you are being discharged the day of surgery, you will not be allowed to drive home. You will need a responsible individual to drive you home and stay with you for 24 hours after surgery.   If you are taking public transportation, you will need to have a responsible individual with you.  Please call the Pre-admissions Testing Dept. at [phone] if you have any questions about these instructions.  Surgery Visitation Policy:  Patients having surgery or a procedure  may have two visitors.  Children under the age of 65 must have an adult with them who is not the patient.  Inpatient Visitation:    Visiting hours are 7 a.m. to 8 p.m. Up to four visitors are allowed at one time in a patient room. The visitors may rotate out with other people during the day.  One visitor age 10 or older may stay with the patient overnight and must be in the room by 8 p.m.   Merchandiser, Retail to address health-related social needs:  [email or web address]

## 2024-01-10 ENCOUNTER — Ambulatory Visit: Payer: Self-pay

## 2024-01-10 MED ORDER — NITROFURANTOIN MONOHYD MACRO 100 MG PO CAPS: 100.0000 mg | ORAL_CAPSULE | Freq: Two times a day (BID) | ORAL | 0 refills | Status: AC

## 2024-01-10 NOTE — Telephone Encounter (Signed)
 Spoke with pt. Pt. Advised of all results and verbalized understanding. Medication sent to pharmacy.

## 2024-01-10 NOTE — Progress Notes (Unsigned)
 " Cardiology Office Note   Date:  01/11/2024  ID:  Jose Carroll, DOB [birth year], MRN [id number] PCP: Orlean Alan HERO, FNP  [facility] Providers Cardiologist:  Evalene Lunger, MD   History of Present Illness Jose Carroll is a 58 y.o. male with a h/o CAD s/p PCI/DES OM 10/2017, chronically occluded mid RCA with L>R collaterals, PAD, DJD, COPD, HLD, COPD, tobacco use, diabetes who presents for follow-up of CAD.   The patient was previously followed by Dr. Fernand, started to follow with Dr. Gollan 05/2022.  He was last seen by Dr. Lunger 11/2022 reporting vague chest pain that resolved with SL NTG. BP was low on imdur  and metoprolol . Metoprolol  was decreased.   Today, the patient is needing urethral surgery, scheduled for 1/5. Need to hold Brilinta  prior to surgery. reports occasional chest pain resolved with SL NTG, occurs every 4-5 months. He has occasional SOB from COPD. He has lymphedema on the right. He takes lasix  daily. He denies heart racing or palpitations. He is smoking 1/2 ppd. No alcohol or drug use. He has had 5 back operations, which limits his function. He reports leg pain.   Studies Reviewed EKG Interpretation Date/Time:  Wednesday January 11 2024 08:39:41 EST Ventricular Rate:  69 PR Interval:  142 QRS Duration:  92 QT Interval:  422 QTC Calculation: 452 R Axis:   84  Text Interpretation: Normal sinus rhythm Normal ECG When compared with ECG of 13-May-2023 20:27, No significant change was found Confirmed by Franchester, Skylene Deremer ([id number]) on 01/11/2024 8:41:40 AM    Coronary stent intervention  Mid RCA lesion is 100% stenosed. Mid Cx lesion is 95% stenosed. Post intervention, there is a 0% residual stenosis. A drug-eluting stent was successfully placed using a STENT RESOLUTE ONYX 2.5X15. Dist Cx lesion is 30% stenosed.   Successful angioplasty and drug-eluting stent placement to the mid left circumflex. The RCA is chronically occluded with left-to-right  collaterals.   Recommendations:   Recommend uninterrupted dual antiplatelet therapy with Aspirin  81mg  daily and Ticagrelor  90mg  twice daily for a minimum of 12 months (ACS - Class I recommendation).    I discontinued Vorapaxar given that the patient is going to be on dual antiplatelet therapy. Recommend aggressive treatment of risk factors. Hydrate over the next 10 hours given underlying chronic kidney disease.  100 mL of contrast was used for the diagnostic cath and 50 mL for PCI for a total of 150 mL contrast  LHC 10/28/2017 Mid RCA lesion is 100% stenosed. Mid Cx to Dist Cx lesion is 95% stenosed.   LVEF 60% with  Chronically occluded mid RCA with collaterals from OM1, and 95% distal LCX disease needing PCI/DES. Will admitt and get renal to see patient and give bicarb drip.   No indication for antiplatelet therapy at this time.   LHC 06/26/23 Mid RCA lesion, 100% stenosed. The lesion was not previously treated. Dist Cx lesion, 30% stenosed. Dist LAD lesion, 40% stenosed.   Chronically occlude mid RCA with good collaterals from LAD/LCX. Treat medically.       Physical Exam VS:  BP (!) 140/84 (BP Location: Left Arm, Patient Position: Sitting, Cuff Size: Normal) Comment: has not taken this AM  Pulse 69   Ht 5' 10 (1.778 m)   Wt 205 lb 9.6 oz (93.3 kg)   SpO2 99%   BMI 29.50 kg/m        Wt Readings from Last 3 Encounters:  01/11/24 205 lb 9.6 oz (93.3 kg)  12/01/23  196 lb (88.9 kg)  09/27/23 188 lb 12.8 oz (85.6 kg)    GEN: Well nourished, well developed in no acute distress NECK: No JVD; No carotid bruits CARDIAC: RRR, no murmurs, rubs, gallops RESPIRATORY:  diffusely diminished ABDOMEN: Soft, non-tender, non-distended EXTREMITIES:  left leg edema; No deformity   ASSESSMENT AND PLAN  Pre-operative cardiac evaluation CAD s/p stenting 2019 PAD Tobacco use HLD Patient is needing a urethral surgery, scheduled for January 16, 2023.  The patient is overall stable from  a cardiac perspective.  He has occasional angina that is unchanged, this occurs every 4 to 5 months.  He takes sublingual nitroglycerin  with resolution.  He has COPD and stable dyspnea on exertion.  He has chronic lymphedema that is worse on the left side.  He takes Lasix  daily.  Patient has a history of PAD followed by vascular surgery.  He denies palpitations or heart racing.  Recommend continuing aspirin  perioperatively.  He can hold Brilinta  5 days prior to surgery.  No further cardiac workup required prior to surgery. METs greater than 4 RCRI = 5% risk of MACE    Dispo: 1 year follow-up  Signed, Alvie Speltz VEAR Fishman, PA-C   "

## 2024-01-10 NOTE — Telephone Encounter (Signed)
-----   Message from Redell Burnet, MD sent at 01/10/2024  6:44 AM EST ----- Regarding: abx Please start nitrofurantoin 100mg  BID X 7 days to sterilize urine prior to upcoming procedure  Redell Burnet, MD 01/10/2024

## 2024-01-11 ENCOUNTER — Ambulatory Visit: Attending: Medical | Admitting: Medical

## 2024-01-11 ENCOUNTER — Encounter: Payer: Self-pay | Admitting: Medical

## 2024-01-11 ENCOUNTER — Other Ambulatory Visit: Payer: Self-pay | Admitting: Cardiovascular Disease

## 2024-01-11 ENCOUNTER — Encounter
Admission: RE | Admit: 2024-01-11 | Discharge: 2024-01-11 | Disposition: A | Discharge status: Home / Self Care | Source: Ambulatory Visit | Attending: Urology | Admitting: Urology

## 2024-01-11 VITALS — BP 140/84 | HR 69 | Ht 70.0 in | Wt 205.6 lb

## 2024-01-11 DIAGNOSIS — I25118 Atherosclerotic heart disease of native coronary artery with other forms of angina pectoris: Secondary | ICD-10-CM | POA: Diagnosis not present

## 2024-01-11 DIAGNOSIS — Z01812 Encounter for preprocedural laboratory examination: Secondary | ICD-10-CM | POA: Diagnosis present

## 2024-01-11 DIAGNOSIS — Z720 Tobacco use: Secondary | ICD-10-CM | POA: Diagnosis not present

## 2024-01-11 DIAGNOSIS — I70211 Atherosclerosis of native arteries of extremities with intermittent claudication, right leg: Secondary | ICD-10-CM | POA: Diagnosis not present

## 2024-01-11 DIAGNOSIS — E782 Mixed hyperlipidemia: Secondary | ICD-10-CM | POA: Diagnosis not present

## 2024-01-11 DIAGNOSIS — I959 Hypotension, unspecified: Secondary | ICD-10-CM

## 2024-01-11 DIAGNOSIS — E1100 Type 2 diabetes mellitus with hyperosmolarity without nonketotic hyperglycemic-hyperosmolar coma (NKHHC): Secondary | ICD-10-CM | POA: Insufficient documentation

## 2024-01-11 DIAGNOSIS — Z01810 Encounter for preprocedural cardiovascular examination: Secondary | ICD-10-CM | POA: Diagnosis not present

## 2024-01-11 LAB — BASIC METABOLIC PANEL WITH GFR
Anion gap: 6 (ref 5–15)
BUN: 10 mg/dL (ref 6–20)
CO2: 27 mmol/L (ref 22–32)
Calcium: 9.2 mg/dL (ref 8.9–10.3)
Chloride: 106 mmol/L (ref 98–111)
Creatinine, Ser: 1.42 mg/dL — ABNORMAL HIGH (ref 0.61–1.24)
GFR, Estimated: 58 mL/min — ABNORMAL LOW
Glucose, Bld: 85 mg/dL (ref 70–99)
Potassium: 4.2 mmol/L (ref 3.5–5.1)
Sodium: 140 mmol/L (ref 135–145)

## 2024-01-11 MED ORDER — NITROGLYCERIN 0.4 MG SL SUBL: 0.4000 mg | SUBLINGUAL_TABLET | SUBLINGUAL | 3 refills | Status: AC | PRN

## 2024-01-11 NOTE — Progress Notes (Addendum)
 Cardiac clearance in epic from 01-11-24 from Cadence Furth PA-Per Cardiology Brilinta  can be stopped 5 days prior  to surgery- Called pt to make sure that this message was relayed to him and it was. Last dose of Brilinta  was on 12-30-Pt aware to not take any more until after his surgery and he will be instructed on when to resume. Pt verbalized understanding

## 2024-01-11 NOTE — Patient Instructions (Signed)
 Medication Instructions:   Your physician recommends that you continue on your current medications as directed. Please refer to the Current Medication list given to you today.    *If you need a refill on your cardiac medications before your next appointment, please call your pharmacy*  Lab Work:  None ordered at this time   If you have labs (blood work) drawn today and your tests are completely normal, you will receive your results only by:  MyChart Message (if you have MyChart) OR  A paper copy in the mail If you have any lab test that is abnormal or we need to change your treatment, we will call you to review the results.  Testing/Procedures:  None ordered at this time   Referrals:  None ordered at this time   Follow-Up:  At [facility], you and your health needs are our priority.  As part of our continuing mission to provide you with exceptional heart care, our providers are all part of one team.  This team includes your primary Cardiologist (physician) and Advanced Practice Providers or APPs (Physician Assistants and Nurse Practitioners) who all work together to provide you with the care you need, when you need it.  Your next appointment:   1 year(s)  Provider:    Evalene Lunger, MD or Cadence Furth, PA-C    We recommend signing up for the patient portal called MyChart.  Sign up information is provided on this After Visit Summary.  MyChart is used to connect with patients for Virtual Visits (Telemedicine).  Patients are able to view lab/test results, encounter notes, upcoming appointments, etc.  Non-urgent messages can be sent to your provider as well.   To learn more about what you can do with MyChart, go to [email or web address].

## 2024-01-12 ENCOUNTER — Other Ambulatory Visit: Payer: Self-pay | Admitting: Cardiovascular Disease

## 2024-01-12 ENCOUNTER — Other Ambulatory Visit: Payer: Self-pay | Admitting: Internal Medicine

## 2024-01-15 MED ORDER — ORAL CARE MOUTH RINSE: 15.0000 mL | Freq: Once | OROMUCOSAL | Status: AC

## 2024-01-15 MED ORDER — SODIUM CHLORIDE 0.9 % IV SOLN: INTRAVENOUS | Status: DC

## 2024-01-15 MED ORDER — CEFAZOLIN SODIUM-DEXTROSE 2-4 GM/100ML-% IV SOLN
2.0000 g | INTRAVENOUS | Status: AC
  Administered 2024-01-16: 2 g via INTRAVENOUS

## 2024-01-15 MED ORDER — CHLORHEXIDINE GLUCONATE 0.12 % MT SOLN
15.0000 mL | Freq: Once | OROMUCOSAL | Status: AC
  Administered 2024-01-16: 15 mL via OROMUCOSAL

## 2024-01-16 ENCOUNTER — Ambulatory Visit

## 2024-01-16 ENCOUNTER — Ambulatory Visit: Payer: Self-pay | Admitting: Urgent Care

## 2024-01-16 ENCOUNTER — Other Ambulatory Visit: Payer: Self-pay

## 2024-01-16 ENCOUNTER — Encounter: Payer: Self-pay | Admitting: Urology

## 2024-01-16 ENCOUNTER — Ambulatory Visit
Admission: RE | Admit: 2024-01-16 | Discharge: 2024-01-16 | Disposition: A | Discharge status: Home / Self Care | Attending: Urology | Admitting: Urology

## 2024-01-16 ENCOUNTER — Ambulatory Visit: Admitting: Certified Registered"

## 2024-01-16 ENCOUNTER — Encounter
Admission: RE | Disposition: A | Discharge status: Home / Self Care | Payer: Self-pay | Source: Home / Self Care | Attending: Urology

## 2024-01-16 DIAGNOSIS — E1100 Type 2 diabetes mellitus with hyperosmolarity without nonketotic hyperglycemic-hyperosmolar coma (NKHHC): Secondary | ICD-10-CM

## 2024-01-16 DIAGNOSIS — G8929 Other chronic pain: Secondary | ICD-10-CM | POA: Diagnosis not present

## 2024-01-16 DIAGNOSIS — E1151 Type 2 diabetes mellitus with diabetic peripheral angiopathy without gangrene: Secondary | ICD-10-CM | POA: Diagnosis not present

## 2024-01-16 DIAGNOSIS — I10 Essential (primary) hypertension: Secondary | ICD-10-CM

## 2024-01-16 DIAGNOSIS — K219 Gastro-esophageal reflux disease without esophagitis: Secondary | ICD-10-CM | POA: Diagnosis not present

## 2024-01-16 DIAGNOSIS — N35911 Unspecified urethral stricture, male, meatal: Secondary | ICD-10-CM | POA: Insufficient documentation

## 2024-01-16 DIAGNOSIS — I2510 Atherosclerotic heart disease of native coronary artery without angina pectoris: Secondary | ICD-10-CM | POA: Diagnosis not present

## 2024-01-16 DIAGNOSIS — N35912 Unspecified bulbous urethral stricture, male: Secondary | ICD-10-CM | POA: Diagnosis not present

## 2024-01-16 DIAGNOSIS — J449 Chronic obstructive pulmonary disease, unspecified: Secondary | ICD-10-CM | POA: Diagnosis not present

## 2024-01-16 DIAGNOSIS — E1122 Type 2 diabetes mellitus with diabetic chronic kidney disease: Secondary | ICD-10-CM | POA: Insufficient documentation

## 2024-01-16 DIAGNOSIS — I129 Hypertensive chronic kidney disease with stage 1 through stage 4 chronic kidney disease, or unspecified chronic kidney disease: Secondary | ICD-10-CM | POA: Insufficient documentation

## 2024-01-16 DIAGNOSIS — J439 Emphysema, unspecified: Secondary | ICD-10-CM

## 2024-01-16 DIAGNOSIS — N189 Chronic kidney disease, unspecified: Secondary | ICD-10-CM | POA: Diagnosis not present

## 2024-01-16 DIAGNOSIS — N35819 Other urethral stricture, male, unspecified site: Secondary | ICD-10-CM

## 2024-01-16 HISTORY — PX: CYSTOURETHROSCOPY, W/ URETHRAL STRICTURE DILATION USING DRUG-COATED BALLOON: SHX7696

## 2024-01-16 HISTORY — PX: CYSTOSCOPY WITH RETROGRADE URETHROGRAM: SHX6309

## 2024-01-16 LAB — GLUCOSE, CAPILLARY
Glucose-Capillary: 193 mg/dL — ABNORMAL HIGH (ref 70–99)
Glucose-Capillary: 199 mg/dL — ABNORMAL HIGH (ref 70–99)

## 2024-01-16 MED ORDER — ACETAMINOPHEN 10 MG/ML IV SOLN: 1000.0000 mg | Freq: Once | INTRAVENOUS | Status: DC | PRN

## 2024-01-16 MED ORDER — ROCURONIUM BROMIDE 100 MG/10ML IV SOLN
INTRAVENOUS | Status: DC | PRN
  Administered 2024-01-16: 50 mg via INTRAVENOUS

## 2024-01-16 MED ORDER — ESMOLOL HCL 100 MG/10ML IV SOLN
INTRAVENOUS | Status: DC | PRN
  Administered 2024-01-16: 30 mg via INTRAVENOUS

## 2024-01-16 MED ORDER — SUGAMMADEX SODIUM 200 MG/2ML IV SOLN
INTRAVENOUS | Status: DC | PRN
  Administered 2024-01-16: 362.8 mg via INTRAVENOUS

## 2024-01-16 MED ORDER — HYDRALAZINE HCL 20 MG/ML IJ SOLN
INTRAMUSCULAR | Status: DC | PRN
  Administered 2024-01-16 (×2): 8 mg via INTRAVENOUS

## 2024-01-16 MED ORDER — ACETAMINOPHEN 10 MG/ML IV SOLN
INTRAVENOUS | Status: DC | PRN
  Administered 2024-01-16: 1000 mg via INTRAVENOUS

## 2024-01-16 MED ORDER — GLYCOPYRROLATE 0.2 MG/ML IJ SOLN
INTRAMUSCULAR | Status: DC | PRN
  Administered 2024-01-16: .2 mg via INTRAVENOUS

## 2024-01-16 MED ORDER — ONDANSETRON HCL 4 MG/2ML IJ SOLN
INTRAMUSCULAR | Status: DC | PRN
  Administered 2024-01-16 (×2): 4 mg via INTRAVENOUS

## 2024-01-16 MED ORDER — FENTANYL CITRATE (PF) 100 MCG/2ML IJ SOLN
INTRAMUSCULAR | Status: DC | PRN
  Administered 2024-01-16: 50 ug via INTRAVENOUS

## 2024-01-16 MED ORDER — OXYCODONE HCL 5 MG PO TABS: 5.0000 mg | ORAL_TABLET | Freq: Once | ORAL | Status: DC | PRN

## 2024-01-16 MED ORDER — CEFAZOLIN SODIUM-DEXTROSE 2-4 GM/100ML-% IV SOLN
INTRAVENOUS | Status: AC
  Filled 2024-01-16: qty 100

## 2024-01-16 MED ORDER — FENTANYL CITRATE (PF) 100 MCG/2ML IJ SOLN
INTRAMUSCULAR | Status: AC
  Filled 2024-01-16: qty 2

## 2024-01-16 MED ORDER — FENTANYL CITRATE (PF) 100 MCG/2ML IJ SOLN
25.0000 ug | INTRAMUSCULAR | Status: DC | PRN
  Administered 2024-01-16 (×4): 25 ug via INTRAVENOUS

## 2024-01-16 MED ORDER — OXYCODONE HCL 5 MG/5ML PO SOLN: 5.0000 mg | Freq: Once | ORAL | Status: DC | PRN

## 2024-01-16 MED ORDER — ACETAMINOPHEN 10 MG/ML IV SOLN
INTRAVENOUS | Status: AC
  Filled 2024-01-16: qty 100

## 2024-01-16 MED ORDER — DEXMEDETOMIDINE HCL IN NACL 80 MCG/20ML IV SOLN
INTRAVENOUS | Status: DC | PRN
  Administered 2024-01-16: 12 ug via INTRAVENOUS

## 2024-01-16 MED ORDER — MIDAZOLAM HCL 2 MG/2ML IJ SOLN
INTRAMUSCULAR | Status: AC
  Filled 2024-01-16: qty 2

## 2024-01-16 MED ORDER — LIDOCAINE HCL (CARDIAC) PF 100 MG/5ML IV SOSY
PREFILLED_SYRINGE | INTRAVENOUS | Status: DC | PRN
  Administered 2024-01-16: 100 mg via INTRAVENOUS

## 2024-01-16 MED ORDER — DROPERIDOL 2.5 MG/ML IJ SOLN: 0.6250 mg | Freq: Once | INTRAMUSCULAR | Status: DC | PRN

## 2024-01-16 MED ORDER — SEVOFLURANE IN SOLN
RESPIRATORY_TRACT | Status: AC
  Filled 2024-01-16: qty 250

## 2024-01-16 MED ORDER — CHLORHEXIDINE GLUCONATE 0.12 % MT SOLN
OROMUCOSAL | Status: AC
  Filled 2024-01-16: qty 15

## 2024-01-16 MED ORDER — IOHEXOL 180 MG/ML  SOLN
INTRAMUSCULAR | Status: DC | PRN
  Administered 2024-01-16: 30 mL

## 2024-01-16 MED ORDER — STERILE WATER FOR IRRIGATION IR SOLN
Status: DC | PRN
  Administered 2024-01-16: 500 mL
  Administered 2024-01-16: 3000 mL

## 2024-01-16 MED ORDER — PROPOFOL 10 MG/ML IV BOLUS
INTRAVENOUS | Status: DC | PRN
  Administered 2024-01-16: 110 mg via INTRAVENOUS

## 2024-01-16 NOTE — Transfer of Care (Signed)
 Immediate Anesthesia Transfer of Care Note  Patient: Jose Carroll  Procedure(s) Performed: Procedures: CYSTOURETHROSCOPY, WITH URETHRAL STRICTURE DILATION USING DRUG-COATED BALLOON (N/A) CYSTOSCOPY WITH RETROGRADE URETHROGRAM (N/A)  Patient Location: PACU  Anesthesia Type:General  Level of Consciousness: sedated  Airway & Oxygen Therapy: Patient Spontanous Breathing and Patient connected to face mask oxygen  Post-op Assessment: Report given to RN and Post -op Vital signs reviewed and stable  Post vital signs: Reviewed and stable  Last Vitals:  Vitals:   01/16/24 1018 01/16/24 1145  BP: (!) 188/87 (!) 164/106  Pulse: 75 92  Resp: 15 12  Temp: (!) 36.1 C 36.6 C  SpO2: 100% 98%    Complications: No apparent anesthesia complications

## 2024-01-16 NOTE — Anesthesia Preprocedure Evaluation (Addendum)
 "                                  Anesthesia Evaluation  Patient identified by MRN, date of birth, ID band Patient awake    Reviewed: Allergy & Precautions, H&P , NPO status , Patient's Chart, lab work & pertinent test results  Airway Mallampati: II  TM Distance: >3 FB Neck ROM: full    Dental  (+) Edentulous Upper, Edentulous Lower   Pulmonary shortness of breath, COPD, Current Smoker   Pulmonary exam normal        Cardiovascular hypertension, + CAD, + Cardiac Stents and + Peripheral Vascular Disease  Normal cardiovascular exam  LHC 06/26/23  Mid RCA lesion, 100% stenosed. The lesion was not previously treated.  Dist Cx lesion, 30% stenosed.  Dist LAD lesion, 40% stenosed.   Chronically occlude mid RCA with good collaterals from LAD/LCX. Treat medically.    LHC 10/28/2017: EF 60%; 100% mRCA, 95% m-dLCx, 30% dLCx; PCI placing a 2.5 x 15 mm Resolute Onyx DES x1 to mLCx. Chronic RCA occlusion with L-R collaterals.   Neuro/Psych negative neurological ROS  negative psych ROS   GI/Hepatic Neg liver ROS,GERD  ,,  Endo/Other  diabetes, Well Controlled, Type 2    Renal/GU CRFRenal disease     Musculoskeletal   Abdominal Normal abdominal exam  (+)   Peds  Hematology  (+) Blood dyscrasia thrombocytopenia   Anesthesia Other Findings Cardiology note: Patient is needing a urethral surgery, scheduled for January 16, 2023.  The patient is overall stable from a cardiac perspective.  He has occasional angina that is unchanged, this occurs every 4 to 5 months.  He takes sublingual nitroglycerin  with resolution.  He has COPD and stable dyspnea on exertion.  He has chronic lymphedema that is worse on the left side.  He takes Lasix  daily.  Patient has a history of PAD followed by vascular surgery.  He denies palpitations or heart racing.  Recommend continuing aspirin  perioperatively.  He can hold Brilinta  5 days prior to surgery.  No further cardiac workup required prior  to surgery. METs greater than 4 RCRI = 5% risk of MACE    Past Medical History: No date: Anginal pain No date: Arthritis No date: Chronic pain No date: Chronic, continuous use of opioids No date: CKD (chronic kidney disease) No date: Claudication of both lower extremities No date: COPD (chronic obstructive pulmonary disease) (HCC) No date: Coronary artery disease     Comment:  a.) LHC 09/10/2014: 50% mLCx, 50% mLAD, 100% p-m RCA;               good collaterals from LAD; med mgmt. b.) LHC 06/26/2015:               100% mRCA, 30% dLCx, 40% dLAD; good collaterals from               LAD/LCx; med mgmt. c.) LHC 10/28/2017: EF 60%; 100% mRCA,              95% m-dLCx, 30% dLCx; PCI placing a 2.5 x 15 mm Resolute               Onyx DES x1 to mLCx. Chronic RCA occlusion with L-R               collaterals. 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, unspecified stricture  type No date: T2DM (type 2 diabetes mellitus) (HCC) No date: Urine retention No date: Vitamin D  deficiency  Past Surgical History: No date: BACK SURGERY     Comment:  4 09/10/2014: CARDIAC CATHETERIZATION; N/A     Comment:  Procedure: Left Heart Cath and Coronary Angiogram ;                Surgeon: Denyse DELENA Bathe, MD;  Location: [facility]               [facility];  Service: Cardiovascular;  Laterality: N/A; 06/26/2015: CARDIAC CATHETERIZATION; Left     Comment:  Procedure:  Left Heart Cath and Coronary Angiography;                Surgeon: Denyse DELENA Bathe, MD;  Location: [facility]               [facility];  Service: Cardiovascular;  Laterality: Left; 10/28/2017: CORONARY STENT INTERVENTION; N/A     Comment:  Procedure: CORONARY STENT INTERVENTION (2.5 x 15 mm               Resolute Onyx DES x 1 to mLCx);  Surgeon: Darron Deatrice DELENA, MD;  Location: [facility];  Service:               Cardiovascular;  Laterality: N/A; 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 No date: FINGER SURGERY     Comment:  sixth finger removed No date: HERNIA REPAIR 10/28/2017: LEFT HEART CATH AND CORONARY ANGIOGRAPHY; Left     Comment:  Procedure: LEFT HEART CATH AND CORONARY ANGIOGRAPHY;                Surgeon: Bathe Denyse DELENA, MD;  Location: [facility]              [facility];  Service: Cardiovascular;  Laterality: Left; 12/29/2020: PULSE GENERATOR IMPLANT; N/A     Comment:  Procedure: PULSE GENERATOR IMPLANT (MEDTRONIC);                Surgeon: Bluford Standing, MD;  Location: [facility];  Service:              Neurosurgery;  Laterality: N/A; No date: SUBCLAVIAN ARTERY STENT 12/22/2020: THORACIC LAMINECTOMY FOR SPINAL CORD STIMULATOR; N/A     Comment:  Procedure: THORACIC LAMINECTOMY FOR SPINAL CORD               STIMULATOR PADDLE TRIAL (MEDTRONIC);  Surgeon: Bluford Standing, MD;  Location: [facility];  Service: Neurosurgery;               Laterality: N/A; No date: TONSILLECTOMY     Reproductive/Obstetrics negative OB ROS  Anesthesia Physical Anesthesia Plan  ASA: 3  Anesthesia Plan: General   Post-op Pain Management:    Induction: Intravenous  PONV Risk Score and Plan: Dexamethasone  and Ondansetron   Airway Management Planned: LMA and Oral ETT  Additional Equipment:   Intra-op Plan:   Post-operative Plan: Extubation in OR  Informed Consent: I have reviewed the patients History and  Physical, chart, labs and discussed the procedure including the risks, benefits and alternatives for the proposed anesthesia with the patient or authorized representative who has indicated his/her understanding and acceptance.     Dental Advisory Given  Plan Discussed with: Anesthesiologist, CRNA and Surgeon  Anesthesia Plan Comments:          Anesthesia Quick Evaluation  "

## 2024-01-16 NOTE — H&P (Signed)
 "  01/16/2024 10:55 AM   Jose Carroll 14-Mar-1965 [id number]  CC: Urethral stricture  HPI: 59 year old male with past medical history notable for chronic pain, COPD, CAD who was found to have a 3 French proximal urethral stricture in early 2025, originally no-showed surgery date for cystoscopy, balloon dilation.  Was reestablished and opted for definitive treatment for his obstructive urinary symptoms.   PMH: Past Medical History:  Diagnosis Date   Anginal pain    Arthritis    Chronic pain    Chronic, continuous use of opioids    CKD (chronic kidney disease)    Claudication of both lower extremities    COPD (chronic obstructive pulmonary disease) (HCC)    Coronary artery disease    a.) LHC 09/10/2014: 50% mLCx, 50% mLAD, 100% p-m RCA; good collaterals from LAD; med mgmt. b.) LHC 06/26/2015: 100% mRCA, 30% dLCx, 40% dLAD; good collaterals from LAD/LCx; med mgmt. c.) LHC 10/28/2017: EF 60%; 100% mRCA, 95% m-dLCx, 30% dLCx; PCI placing a 2.5 x 15 mm Resolute Onyx DES x1 to mLCx. Chronic RCA occlusion with L-R collaterals.   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, unspecified stricture type    T2DM (type 2 diabetes mellitus) (HCC)    Urine retention    Vitamin D  deficiency     Surgical History: Past Surgical History:  Procedure Laterality Date   BACK SURGERY     4   CARDIAC CATHETERIZATION N/A 09/10/2014   Procedure: Left Heart Cath and Coronary Angiogram ;  Surgeon: Denyse DELENA Bathe, MD;  Location: [facility];  Service:  Cardiovascular;  Laterality: N/A;   CARDIAC CATHETERIZATION Left 06/26/2015   Procedure: Left Heart Cath and Coronary Angiography;  Surgeon: Denyse DELENA Bathe, MD;  Location: [facility];  Service: Cardiovascular;  Laterality: Left;   CORONARY STENT INTERVENTION N/A 10/28/2017   Procedure: CORONARY STENT INTERVENTION (2.5 x 15 mm Resolute Onyx DES x 1 to mLCx);  Surgeon: Darron Deatrice DELENA, MD;  Location: [facility];  Service: Cardiovascular;  Laterality: N/A;   FETAL SURGERY FOR CONGENITAL HERNIA  2012   FINGER SURGERY     sixth finger removed   HERNIA REPAIR     LEFT HEART CATH AND CORONARY ANGIOGRAPHY Left 10/28/2017   Procedure: LEFT HEART CATH AND CORONARY ANGIOGRAPHY;  Surgeon: Bathe Denyse DELENA, MD;  Location: [facility];  Service: Cardiovascular;  Laterality: Left;   PULSE GENERATOR IMPLANT N/A 12/29/2020   Procedure: PULSE GENERATOR IMPLANT (MEDTRONIC);  Surgeon: Bluford Standing, MD;  Location: [facility];  Service: Neurosurgery;  Laterality: N/A;   SUBCLAVIAN ARTERY STENT     THORACIC LAMINECTOMY FOR SPINAL CORD STIMULATOR N/A 12/22/2020   Procedure: THORACIC LAMINECTOMY FOR SPINAL CORD STIMULATOR PADDLE TRIAL (MEDTRONIC);  Surgeon: Bluford Standing, MD;  Location: [facility];  Service: Neurosurgery;  Laterality: N/A;   TONSILLECTOMY       Family History: Family History  Problem Relation Age of Onset   Cancer Mother    Diabetes  Mother    Anuerysm Father     Social History:  reports that he has been smoking cigarettes. He started smoking about 33 years ago. He has a 31 pack-year smoking history. He quit smokeless tobacco use about 8 years ago. He reports that he does not drink alcohol and does not use drugs.  Physical Exam: BP (!) 188/87   Pulse 75   Temp (!) 96.9 F (36.1 C)   Resp 15   Ht 5' 10 (1.778 m)   Wt 90.7 kg   SpO2 100%   BMI 28.70 kg/m    Constitutional:  Alert and oriented, No acute distress. Cardiovascular: Regular rate and  rhythm Respiratory: Clear to auscultation bilaterally GI: Abdomen is soft, nontender, nondistended, no abdominal masses   Laboratory Data: Urine culture 12/22 with Staph epidermidis, treated with culture appropriate antibiotics  Assessment & Plan:   59 year old male with obstructive urinary symptoms, found to have 3 French proximal urethral stricture, opted for retrograde urethrogram, balloon dilation.  Discussed possible additional pathology after dilation of stricture and approximately 20% risk of recurrence.  Risk of bleeding, infection, recurrence reviewed at length.  Retrograde urethrogram, cystoscopy, Optilume balloon dilation  Redell Burnet, MD 01/16/2024  [facility] [address] [place], [place] [address] [phone]    "

## 2024-01-16 NOTE — Op Note (Signed)
 Date of procedure: 01/16/2024  Preoperative diagnosis:  Urethral stricture Meatal stenosis  Postoperative diagnosis:  Same  Procedure: Cystoscopy, retrograde urethrogram with intraoperative interpretation Optilume balloon dilation of urethral stricture  Surgeon: Redell Burnet, MD  Anesthesia: General  Complications: None  Intraoperative findings:  Very tight meatus, dilated with Fleeta Needs sounds to 30 French  ~30F bulbar urethral stricture, ~1cm in length Small to moderate size prostate, normal bladder, no suspicious bladder lesions  EBL: Minimal  Specimens: None none  Drains: 20 French two-way council Foley  Indication: ALCARIO TINKEY is a 59 y.o. patient with obstructive urinary symptoms found to have urethral stricture on clinic cystoscopy.  After reviewing the management options for treatment, they elected to proceed with the above surgical procedure(s). We have discussed the potential benefits and risks of the procedure, side effects of the proposed treatment, the likelihood of the patient achieving the goals of the procedure, and any potential problems that might occur during the procedure or recuperation. Informed consent has been obtained.  Description of procedure:  The patient was taken to the operating room and general anesthesia was induced. SCDs were placed for DVT prophylaxis.  A bump was placed under the left hip and a retrograde urethrogram showed a approximately 3 French 1 cm bulbar urethral stricture.  The patient was placed in the dorsal lithotomy position, prepped and draped in the usual sterile fashion, and preoperative antibiotics were administered. A preoperative time-out was performed.   Fleeta Needs sounds were used to dilate the meatus from 16 French to 30 French.  A 21 French rigid cystoscope was used to intubate the urethra and the penile urethra was still quite tight, I was able to see an approximately 3 French stricture in the proximal ureter and a  sensor wire was advanced through the stricture into the bladder.  A 21 French by 4 cm UroMax balloon was advanced over the location of the stricture and dilated to a pressure of 20 ATM and left in place for 1 minute.  The balloon was deflated and I was able to advance the 20 French cystoscope into the bladder.  The mid urethra was still somewhat tight.  The prostate was small to moderate in size, bladder was grossly normal with no suspicious lesions.  The Optilume 30 French by 5 cm dilating balloon was then advanced over the location of the proximal urethral stricture and inflated to a pressure of 10 ATM, this was allowed to remain in place for 5 minutes.  The balloon was deflated and a 20 French Chiropractor passed easily over the wire to the bladder with return of clear fluid.  10 mL were placed in the balloon and catheter was connected to drainage.  Disposition: Stable to PACU  Plan: Foley removal in clinic in 1 to 2 days Follow-up with MD in 4 to 6 weeks for PVR and symptom check  Redell Burnet, MD

## 2024-01-16 NOTE — Anesthesia Procedure Notes (Signed)
 Procedure Name: Intubation Date/Time: 01/16/2024 11:09 AM  Performed by: Ledora Duncan, CRNAPre-anesthesia Checklist: Patient identified, Emergency Drugs available, Suction available and Patient being monitored Patient Re-evaluated:Patient Re-evaluated prior to induction Oxygen Delivery Method: Circle system utilized Preoxygenation: Pre-oxygenation with 100% oxygen Induction Type: IV induction Ventilation: Mask ventilation without difficulty Laryngoscope Size: Glidescope and 3 Grade View: Grade I Tube type: Oral Tube size: 7.0 mm Number of attempts: 1 Airway Equipment and Method: Stylet Placement Confirmation: ETT inserted through vocal cords under direct vision, positive ETCO2 and breath sounds checked- equal and bilateral Secured at: 21 cm Tube secured with: Tape Dental Injury: Teeth and Oropharynx as per pre-operative assessment

## 2024-01-17 ENCOUNTER — Encounter: Payer: Self-pay | Admitting: Urology

## 2024-01-17 NOTE — Anesthesia Postprocedure Evaluation (Signed)
"   Anesthesia Post Note  Patient: Jose Carroll  Procedure(s) Performed: CYSTOURETHROSCOPY, WITH URETHRAL STRICTURE DILATION USING DRUG-COATED BALLOON CYSTOSCOPY WITH RETROGRADE URETHROGRAM  Patient location during evaluation: PACU Anesthesia Type: General Level of consciousness: awake and alert Pain management: pain level controlled Vital Signs Assessment: post-procedure vital signs reviewed and stable Respiratory status: spontaneous breathing, nonlabored ventilation and respiratory function stable Cardiovascular status: blood pressure returned to baseline and stable Postop Assessment: no apparent nausea or vomiting Anesthetic complications: no   No notable events documented.   Last Vitals:  Vitals:   01/16/24 1230 01/16/24 1244  BP: (!) 172/83 (!) 150/72  Pulse: 84 86  Resp: 10 16  Temp: 36.4 C 36.8 C  SpO2: 96% 95%    Last Pain:  Vitals:   01/16/24 1244  TempSrc: Axillary  PainSc: 0-No pain                 Camellia Merilee Louder      "

## 2024-01-18 ENCOUNTER — Other Ambulatory Visit: Payer: Self-pay | Admitting: Cardiovascular Disease

## 2024-01-18 ENCOUNTER — Ambulatory Visit (INDEPENDENT_AMBULATORY_CARE_PROVIDER_SITE_OTHER): Admitting: Physician Assistant

## 2024-01-18 VITALS — BP 171/77 | HR 82

## 2024-01-18 DIAGNOSIS — N35912 Unspecified bulbous urethral stricture, male: Secondary | ICD-10-CM

## 2024-01-18 NOTE — Progress Notes (Signed)
 Catheter Removal  Patient is present today for a catheter removal.  9 ml of water  was drained from the balloon. A 20FR foley cath was removed from the bladder, no complications were noted. Patient tolerated well. No blood noted in urine before removal  Performed by: Mathew Pinal, RN  Follow up/ Additional notes: Will keep follow up as scheduled

## 2024-01-20 ENCOUNTER — Other Ambulatory Visit: Payer: Self-pay | Admitting: Family

## 2024-01-25 ENCOUNTER — Other Ambulatory Visit

## 2024-01-25 DIAGNOSIS — E782 Mixed hyperlipidemia: Secondary | ICD-10-CM

## 2024-01-25 DIAGNOSIS — R5383 Other fatigue: Secondary | ICD-10-CM

## 2024-01-25 DIAGNOSIS — E1165 Type 2 diabetes mellitus with hyperglycemia: Secondary | ICD-10-CM

## 2024-01-25 DIAGNOSIS — I10 Essential (primary) hypertension: Secondary | ICD-10-CM

## 2024-01-25 DIAGNOSIS — E559 Vitamin D deficiency, unspecified: Secondary | ICD-10-CM

## 2024-01-25 DIAGNOSIS — E538 Deficiency of other specified B group vitamins: Secondary | ICD-10-CM

## 2024-01-26 ENCOUNTER — Other Ambulatory Visit: Payer: Self-pay | Admitting: Cardiovascular Disease

## 2024-01-26 ENCOUNTER — Ambulatory Visit: Payer: Self-pay

## 2024-01-26 LAB — LIPID PANEL
Chol/HDL Ratio: 3.2 ratio (ref 0.0–5.0)
Cholesterol, Total: 99 mg/dL — ABNORMAL LOW (ref 100–199)
HDL: 31 mg/dL — ABNORMAL LOW
LDL Chol Calc (NIH): 47 mg/dL (ref 0–99)
Triglycerides: 115 mg/dL (ref 0–149)
VLDL Cholesterol Cal: 21 mg/dL (ref 5–40)

## 2024-01-26 LAB — CBC WITH DIFFERENTIAL/PLATELET
Basophils Absolute: 0.1 x10E3/uL (ref 0.0–0.2)
Basos: 1 %
EOS (ABSOLUTE): 0.1 x10E3/uL (ref 0.0–0.4)
Eos: 2 %
Hematocrit: 42.2 % (ref 37.5–51.0)
Hemoglobin: 14 g/dL (ref 13.0–17.7)
Immature Grans (Abs): 0 x10E3/uL (ref 0.0–0.1)
Immature Granulocytes: 0 %
Lymphocytes Absolute: 1.5 x10E3/uL (ref 0.7–3.1)
Lymphs: 21 %
MCH: 30.9 pg (ref 26.6–33.0)
MCHC: 33.2 g/dL (ref 31.5–35.7)
MCV: 93 fL (ref 79–97)
Monocytes Absolute: 0.5 x10E3/uL (ref 0.1–0.9)
Monocytes: 7 %
Neutrophils Absolute: 4.8 x10E3/uL (ref 1.4–7.0)
Neutrophils: 69 %
Platelets: 142 x10E3/uL — ABNORMAL LOW (ref 150–450)
RBC: 4.53 x10E6/uL (ref 4.14–5.80)
RDW: 13.6 % (ref 11.6–15.4)
WBC: 6.9 x10E3/uL (ref 3.4–10.8)

## 2024-01-26 LAB — CMP14+EGFR
ALT: 21 IU/L (ref 0–44)
AST: 20 IU/L (ref 0–40)
Albumin: 3.7 g/dL — ABNORMAL LOW (ref 3.8–4.9)
Alkaline Phosphatase: 176 IU/L — ABNORMAL HIGH (ref 47–123)
BUN/Creatinine Ratio: 5 — ABNORMAL LOW (ref 9–20)
BUN: 8 mg/dL (ref 6–24)
Bilirubin Total: 0.3 mg/dL (ref 0.0–1.2)
CO2: 24 mmol/L (ref 20–29)
Calcium: 9.2 mg/dL (ref 8.7–10.2)
Chloride: 103 mmol/L (ref 96–106)
Creatinine, Ser: 1.54 mg/dL — ABNORMAL HIGH (ref 0.76–1.27)
Globulin, Total: 2.7 g/dL (ref 1.5–4.5)
Glucose: 71 mg/dL (ref 70–99)
Potassium: 4 mmol/L (ref 3.5–5.2)
Sodium: 142 mmol/L (ref 134–144)
Total Protein: 6.4 g/dL (ref 6.0–8.5)
eGFR: 53 mL/min/1.73 — ABNORMAL LOW

## 2024-01-26 LAB — HEMOGLOBIN A1C
Est. average glucose Bld gHb Est-mCnc: 128 mg/dL
Hgb A1c MFr Bld: 6.1 % — ABNORMAL HIGH (ref 4.8–5.6)

## 2024-01-26 LAB — TSH: TSH: 1.77 u[IU]/mL (ref 0.450–4.500)

## 2024-01-26 LAB — VITAMIN B12: Vitamin B-12: 572 pg/mL (ref 232–1245)

## 2024-01-26 LAB — VITAMIN D 25 HYDROXY (VIT D DEFICIENCY, FRACTURES): Vit D, 25-Hydroxy: 14 ng/mL — ABNORMAL LOW (ref 30.0–100.0)

## 2024-01-27 ENCOUNTER — Ambulatory Visit: Admitting: Family

## 2024-01-27 ENCOUNTER — Encounter: Payer: Self-pay | Admitting: Family

## 2024-01-27 VITALS — BP 152/92 | HR 76 | Ht 70.0 in | Wt 197.0 lb

## 2024-01-27 DIAGNOSIS — G894 Chronic pain syndrome: Secondary | ICD-10-CM

## 2024-01-27 DIAGNOSIS — H9192 Unspecified hearing loss, left ear: Secondary | ICD-10-CM

## 2024-01-27 DIAGNOSIS — M25511 Pain in right shoulder: Secondary | ICD-10-CM

## 2024-01-27 NOTE — Progress Notes (Unsigned)
 "  Established Patient Office Visit  Subjective:  Patient ID: Jose Carroll, male    DOB: Apr 08, 1965  Age: 59 y.o. MRN: [id number]  Chief Complaint  Patient presents with   Follow-up    4 month follow up    Patient is here today for his 6 months follow up.  He has been feeling fairly well since last appointment.   He does have additional concerns to discuss today.   1) Wife fell 2 months ago and he fell with her. He fell on his right shoulder into a metal frame.  Since then he has had numbness and tingling as well as some weakness.  The pain shoots all the way down his right arm. He tried immobilization, this helps short term, but does not help longer.   2) Left ear hearing. He has been having trouble with his hearing his whole life but he says it has recently been getting worse. Asks for a referral to Audiology.   Labs were done prior to appointment, will discuss in detail today.  He needs refills.   I have reviewed his active problem list, medication list, allergies, health maintenance, notes from last encounter for his appointment today.      No other concerns at this time.   Past Medical History:  Diagnosis Date   Anginal pain    Arthritis    Chronic pain    Chronic, continuous use of opioids    CKD (chronic kidney disease)    Claudication of both lower extremities    COPD (chronic obstructive pulmonary disease) (HCC)    Coronary artery disease    a.) LHC 09/10/2014: 50% mLCx, 50% mLAD, 100% p-m RCA; good collaterals from LAD; med mgmt. b.) LHC 06/26/2015: 100% mRCA, 30% dLCx, 40% dLAD; good collaterals from LAD/LCx; med mgmt. c.) LHC 10/28/2017: EF 60%; 100% mRCA, 95% m-dLCx, 30% dLCx; PCI placing a 2.5 x 15 mm Resolute Onyx DES x1 to mLCx. Chronic RCA occlusion with L-R collaterals.   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, unspecified stricture type    T2DM (type 2 diabetes mellitus) (HCC)    Urine retention    Vitamin D  deficiency     Past Surgical History:  Procedure Laterality Date   BACK SURGERY     4   CARDIAC CATHETERIZATION N/A 09/10/2014   Procedure: Left Heart Cath and Coronary Angiogram ;  Surgeon: Denyse DELENA Bathe, MD;  Location: [facility];  Service: Cardiovascular;  Laterality: N/A;   CARDIAC CATHETERIZATION Left 06/26/2015   Procedure: Left Heart Cath and Coronary Angiography;  Surgeon: Denyse DELENA Bathe, MD;  Location: [facility];  Service: Cardiovascular;  Laterality: Left;   CORONARY STENT INTERVENTION N/A 10/28/2017   Procedure: CORONARY STENT INTERVENTION (2.5 x 15 mm Resolute Onyx DES x 1 to mLCx);  Surgeon: Darron Deatrice DELENA, MD;  Location: [facility];  Service: Cardiovascular;  Laterality: N/A;   CYSTOSCOPY WITH RETROGRADE URETHROGRAM N/A 01/16/2024   Procedure: CYSTOSCOPY WITH  RETROGRADE URETHROGRAM;  Surgeon: Francisca Redell BROCKS, MD;  Location: [facility];  Service: Urology;  Laterality: N/A;   CYSTOURETHROSCOPY, W/ URETHRAL STRICTURE DILATION USING DRUG-COATED BALLOON N/A 01/16/2024   Procedure: CYSTOURETHROSCOPY, WITH URETHRAL STRICTURE DILATION USING DRUG-COATED BALLOON;  Surgeon: Francisca Redell BROCKS, MD;  Location: [facility];  Service: Urology;  Laterality: N/A;   FETAL SURGERY FOR CONGENITAL HERNIA  2012   FINGER SURGERY     sixth finger removed   HERNIA REPAIR     LEFT HEART CATH AND CORONARY ANGIOGRAPHY Left 10/28/2017   Procedure: LEFT HEART CATH AND CORONARY ANGIOGRAPHY;  Surgeon: Fernand Denyse LABOR, MD;  Location: [facility];  Service: Cardiovascular;  Laterality: Left;   PULSE GENERATOR IMPLANT N/A 12/29/2020   Procedure: PULSE GENERATOR IMPLANT (MEDTRONIC);  Surgeon: Bluford Standing, MD;  Location: [facility];  Service: Neurosurgery;  Laterality: N/A;   SUBCLAVIAN ARTERY STENT     THORACIC LAMINECTOMY FOR SPINAL CORD STIMULATOR N/A 12/22/2020   Procedure: THORACIC LAMINECTOMY FOR SPINAL CORD STIMULATOR PADDLE TRIAL (MEDTRONIC);  Surgeon: Bluford Standing, MD;  Location: [facility];  Service: Neurosurgery;  Laterality: N/A;   TONSILLECTOMY      Social History   Socioeconomic History   Marital status: Married    Spouse name: Fusselman,MISTY   Number of children: 1   Years of education: Not on file   Highest education level: Not on file  Occupational History   Not on file  Tobacco Use   Smoking status: Every Day    Current packs/day: 1.00    Average packs/day: 1 pack/day for 31.0 years (31.0 ttl pk-yrs)    Types: Cigarettes    Start date: 07/04/1990    Last attempt to quit: 07/04/2015   Smokeless tobacco: Former    Quit date: 07/01/2015  Vaping Use   Vaping status: Former   Substances: Nicotine   Devices: zero nicotine  Substance and Sexual Activity   Alcohol use: No    Alcohol/week: 0.0 standard drinks of alcohol   Drug use: No   Sexual activity: Yes  Other Topics Concern   Not on file  Social History Narrative   Not on file   Social Drivers of Health   Tobacco Use: High Risk (01/27/2024)   Patient History    Smoking Tobacco Use: Every Day    Smokeless Tobacco Use: Former    Passive Exposure: Not on Actuary Strain: Not on file  Food Insecurity: Not on file  Transportation Needs: Not on file  Physical Activity: Not on file  Stress: Not on file  Social Connections: Not on file  Intimate Partner Violence: Not on file  Depression (PHQ2-9): Medium Risk (03/15/2023)   Depression (PHQ2-9)    PHQ-2 Score: 7  Alcohol Screen: Not on file  Housing:  Not on file  Utilities: Not on file  Health Literacy: Not on file    Family History  Problem Relation Age of Onset   Cancer Mother    Diabetes Mother    Anuerysm Father     Allergies[1]  Review of Systems  HENT:  Positive for hearing loss (Left>right).   Musculoskeletal:  Positive for joint pain.  Neurological:  Positive for tingling, sensory change and weakness.  All other systems reviewed and are negative.      Objective:   BP (!) 152/92   Pulse 76   Ht 5' 10 (1.778 m)   Wt 197 lb (89.4 kg)   SpO2 98%   BMI 28.27 kg/m  Vitals:   01/27/24 1057  BP: (!) 152/92  Pulse: 76  Height: 5' 10 (1.778 m)  Weight: 197 lb (89.4 kg)  SpO2: 98%  BMI (Calculated): 28.27    Physical Exam Vitals and nursing note reviewed.  Constitutional:      Appearance: Normal appearance. He is normal weight.  Eyes:     Pupils: Pupils are equal, round, and reactive to light.  Cardiovascular:     Rate and Rhythm: Normal rate and regular rhythm.     Pulses: Normal pulses.     Heart sounds: Normal heart sounds.  Pulmonary:     Effort: Pulmonary effort is normal.     Breath sounds: Normal breath sounds.  Neurological:     Mental Status: He is alert.  Psychiatric:        Mood and Affect: Mood normal.        Behavior: Behavior normal.      No results found for any visits on 01/27/24.  Recent Results (from the past 2160 hours)  Urinalysis, Complete w Microscopic -     Status: Abnormal   Collection Time: 12/01/23 11:01 AM  Result Value Ref Range   Color, Urine YELLOW (A) YELLOW   APPearance HAZY (A) CLEAR   Specific Gravity, Urine 1.008 1.005 - 1.030   pH 7.0 5.0 - 8.0   Glucose, UA >=500 (A) NEGATIVE mg/dL   Hgb urine dipstick NEGATIVE NEGATIVE   Bilirubin Urine NEGATIVE NEGATIVE   Ketones, ur NEGATIVE NEGATIVE mg/dL   Protein, ur NEGATIVE NEGATIVE mg/dL   Nitrite POSITIVE (A) NEGATIVE   Leukocytes,Ua SMALL (A) NEGATIVE   RBC / HPF 0-5 0 - 5 RBC/hpf   WBC, UA 11-20  0 - 5 WBC/hpf   Bacteria, UA MANY (A) NONE SEEN   Squamous Epithelial / HPF 0-5 0 - 5 /HPF   Mucus PRESENT    Amorphous Crystal PRESENT     Comment: Performed at [facility], [address], [place], [place] [address]  Urine Culture     Status: Abnormal   Collection Time: 12/01/23 11:01 AM   Specimen: Urine, Clean Catch  Result Value Ref Range   Specimen Description      URINE, CLEAN CATCH Performed at [facility], [address] [place], [place] [address]    Special Requests      NONE Performed at [facility], [address] [place], [place] [address]    Culture (A)     >=100,000 COLONIES/mL STREPTOCOCCUS MITIS/ORALIS Standardized susceptibility testing for this organism is not available. Performed at [facility], [address] [place], [place] [address]    Report Status 12/02/2023 FINAL   Bladder Scan (Post Void Residual) in office     Status: None   Collection Time: 12/01/23 11:15 AM  Result Value Ref Range   Scan Result   Urinalysis, Complete     Status: Abnormal   Collection Time: 01/02/24  1:00 PM  Result Value Ref Range   Specific Gravity, UA 1.010 1.005 - 1.030   pH, UA 6.5 5.0 - 7.5   Color, UA Yellow Yellow   Appearance Ur Cloudy (A) Clear   Leukocytes,UA Trace (A) Negative   Protein,UA Negative Negative/Trace   Glucose, UA 3+ (A) Negative   Ketones, UA Negative Negative   RBC, UA Negative Negative   Bilirubin, UA Negative Negative   Urobilinogen, Ur 0.2 0.2 - 1.0 mg/dL   Nitrite, UA Positive (A) Negative   Microscopic  Examination See below:     Comment: Microscopic was indicated and was performed.  Microscopic Examination     Status: Abnormal   Collection Time: 01/02/24  1:00 PM   Urine  Result Value Ref Range   WBC, UA 6-10 (A) 0 - 5 /hpf   RBC, Urine 0-2 0 - 2 /hpf   Epithelial Cells (non renal) 0-10 0 - 10 /hpf   Bacteria, UA Many (A) None seen/Few  CULTURE, URINE COMPREHENSIVE     Status:  Abnormal   Collection Time: 01/02/24  1:25 PM   Specimen: Urine   UR  Result Value Ref Range   Urine Culture, Comprehensive Final report (A)    Organism ID, Bacteria Comment (A)     Comment: Staphylococcus epidermidis Based on resistance to oxacillin this isolate would be resistant to all currently available beta-lactam antimicrobial agents, with the exception of the newer cephalosporins with anti-MRSA activity, such as Ceftaroline Greater than 100,000 colony forming units per mL    ANTIMICROBIAL SUSCEPTIBILITY Comment     Comment:       ** S = Susceptible; I = Intermediate; R = Resistant **                    P = Positive; N = Negative             MICS are expressed in micrograms per mL    Antibiotic                 RSLT#1    RSLT#2    RSLT#3    RSLT#4 Ciprofloxacin                   S Gentamicin                     S Levofloxacin                   S Linezolid                      S Moxifloxacin                   S Nitrofurantoin                  S Oxacillin                      R Penicillin                     R Rifampin                       S Tetracycline                   S Trimethoprim /Sulfa              R Vancomycin                      S   Basic metabolic panel with GFR     Status: Abnormal   Collection Time: 01/11/24  9:23 AM  Result Value Ref Range   Sodium 140 135 - 145 mmol/L   Potassium 4.2 3.5 - 5.1 mmol/L   Chloride 106 98 - 111 mmol/L   CO2 27 22 - 32 mmol/L   Glucose, Bld 85 70 - 99 mg/dL    Comment: Glucose reference range applies only to samples taken  after fasting for at least 8 hours.   BUN 10 6 - 20 mg/dL   Creatinine, Ser 8.57 (H) 0.61 - 1.24 mg/dL   Calcium  9.2 8.9 - 10.3 mg/dL   GFR, Estimated 58 (L) >60 mL/min    Comment: (NOTE) Calculated using the CKD-EPI Creatinine Equation (2021)    Anion gap 6 5 - 15    Comment: Performed at [facility], [address] [place], [place] [address]  Glucose, capillary     Status: Abnormal    Collection Time: 01/16/24  9:58 AM  Result Value Ref Range   Glucose-Capillary 193 (H) 70 - 99 mg/dL    Comment: Glucose reference range applies only to samples taken after fasting for at least 8 hours.  Glucose, capillary     Status: Abnormal   Collection Time: 01/16/24 11:51 AM  Result Value Ref Range   Glucose-Capillary 199 (H) 70 - 99 mg/dL    Comment: Glucose reference range applies only to samples taken after fasting for at least 8 hours.  CBC with Differential/Platelet     Status: Abnormal   Collection Time: 01/25/24 12:04 PM  Result Value Ref Range   WBC 6.9 3.4 - 10.8 x10E3/uL   RBC 4.53 4.14 - 5.80 x10E6/uL   Hemoglobin 14.0 13.0 - 17.7 g/dL   Hematocrit 57.7 62.4 - 51.0 %   MCV 93 79 - 97 fL   MCH 30.9 26.6 - 33.0 pg   MCHC 33.2 31.5 - 35.7 g/dL   RDW 86.3 88.3 - 84.5 %   Platelets 142 (L) 150 - 450 x10E3/uL   Neutrophils 69 Not Estab. %   Lymphs 21 Not Estab. %   Monocytes 7 Not Estab. %   Eos 2 Not Estab. %   Basos 1 Not Estab. %   Neutrophils Absolute 4.8 1.4 - 7.0 x10E3/uL   Lymphocytes Absolute 1.5 0.7 - 3.1 x10E3/uL   Monocytes Absolute 0.5 0.1 - 0.9 x10E3/uL   EOS (ABSOLUTE) 0.1 0.0 - 0.4 x10E3/uL   Basophils Absolute 0.1 0.0 - 0.2 x10E3/uL   Immature Granulocytes 0 Not Estab. %   Immature Grans (Abs) 0.0 0.0 - 0.1 x10E3/uL  CMP14+EGFR     Status: Abnormal   Collection Time: 01/25/24 12:04 PM  Result Value Ref Range   Glucose 71 70 - 99 mg/dL   BUN 8 6 - 24 mg/dL   Creatinine, Ser 8.45 (H) 0.76 - 1.27 mg/dL   eGFR 53 (L) >40 fO/fpw/8.26   BUN/Creatinine Ratio 5 (L) 9 - 20   Sodium 142 134 - 144 mmol/L   Potassium 4.0 3.5 - 5.2 mmol/L   Chloride 103 96 - 106 mmol/L   CO2 24 20 - 29 mmol/L   Calcium  9.2 8.7 - 10.2 mg/dL   Total Protein 6.4 6.0 - 8.5 g/dL   Albumin 3.7 (L) 3.8 - 4.9 g/dL   Globulin, Total 2.7 1.5 - 4.5 g/dL   Bilirubin Total 0.3 0.0 - 1.2 mg/dL   Alkaline Phosphatase 176 (H) 47 - 123 IU/L   AST 20 0 - 40 IU/L   ALT 21 0 - 44 IU/L   Hemoglobin A1c     Status: Abnormal   Collection Time: 01/25/24 12:04 PM  Result Value Ref Range   Hgb A1c MFr Bld 6.1 (H) 4.8 - 5.6 %    Comment:          Prediabetes: 5.7 - 6.4          Diabetes: >6.4  Glycemic control for adults with diabetes: <7.0    Est. average glucose Bld gHb Est-mCnc 128 mg/dL  Lipid panel     Status: Abnormal   Collection Time: 01/25/24 12:04 PM  Result Value Ref Range   Cholesterol, Total 99 (L) 100 - 199 mg/dL   Triglycerides 884 0 - 149 mg/dL   HDL 31 (L) >60 mg/dL   VLDL Cholesterol Cal 21 5 - 40 mg/dL   LDL Chol Calc (NIH) 47 0 - 99 mg/dL   Chol/HDL Ratio 3.2 0.0 - 5.0 ratio    Comment:                                   T. Chol/HDL Ratio                                             Men  Women                               1/2 Avg.Risk  3.4    3.3                                   Avg.Risk  5.0    4.4                                2X Avg.Risk  9.6    7.1                                3X Avg.Risk 23.4   11.0   TSH     Status: None   Collection Time: 01/25/24 12:04 PM  Result Value Ref Range   TSH 1.770 0.450 - 4.500 uIU/mL  Vitamin D  (25 hydroxy)     Status: Abnormal   Collection Time: 01/25/24 12:04 PM  Result Value Ref Range   Vit D, 25-Hydroxy 14.0 (L) 30.0 - 100.0 ng/mL    Comment: Vitamin D  deficiency has been defined by the Institute of Medicine and an Endocrine Society practice guideline as a level of serum 25-OH vitamin D  less than 20 ng/mL (1,2). The Endocrine Society went on to further define vitamin D  insufficiency as a level between 21 and 29 ng/mL (2). 1. IOM (Institute of Medicine). 2010. Dietary reference    intakes for calcium  and D. [place]: The    [employer]. 2. Holick MF, Binkley Somers, Bischoff-Ferrari HA, et al.    Evaluation, treatment, and prevention of vitamin D     deficiency: an Endocrine Society clinical practice    guideline. JCEM. 2011 Jul; 96(7):1911-30.   Vitamin B12     Status: None    Collection Time: 01/25/24 12:04 PM  Result Value Ref Range   Vitamin B-12 572 232 - 1,245 pg/mL        Assessment & Plan Acute pain of right shoulder Setting patient up for referral to Ortho .  Will defer to them for further treatment changes.  Reassess at follow up.  Hearing loss of left ear, unspecified hearing loss type Setting patient up for referral to  Audiology.  Will defer to them for further treatment changes.  Reassess at follow up.  Chronic pain syndrome (sees Dr Fernand for MM) Patient is seen by Pain management, who manage this condition.  He is well controlled with current therapy.   Will defer to them for further changes to plan of care.     No follow-ups on file.   Total time spent: {AMA time spent:29001} minutes  ALAN CHRISTELLA ARRANT, FNP  01/27/2024   This document may have been prepared by [facility] Voice Recognition software and as such may include unintentional dictation errors.       [1] Allergies Allergen Reactions   Naproxen Rash  "

## 2024-01-27 NOTE — Assessment & Plan Note (Signed)
 Patient is seen by Pain management, who manage this condition.  He is well controlled with current therapy.   Will defer to them for further changes to plan of care.

## 2024-02-01 ENCOUNTER — Other Ambulatory Visit: Payer: Self-pay | Admitting: Cardiovascular Disease

## 2024-02-07 ENCOUNTER — Other Ambulatory Visit: Payer: Self-pay | Admitting: Family

## 2024-02-22 ENCOUNTER — Ambulatory Visit: Admitting: Urology

## 2024-03-19 ENCOUNTER — Encounter (INDEPENDENT_AMBULATORY_CARE_PROVIDER_SITE_OTHER)

## 2024-03-19 ENCOUNTER — Ambulatory Visit (INDEPENDENT_AMBULATORY_CARE_PROVIDER_SITE_OTHER): Admitting: Vascular Surgery

## 2024-05-28 ENCOUNTER — Ambulatory Visit: Admitting: Family
